# Patient Record
Sex: Female | Born: 1955 | Race: Black or African American | Hispanic: No | Marital: Married | Smoking: Former smoker
Health system: Southern US, Community
[De-identification: ages and names within clinical notes are randomized; demographics above are authoritative.]

## PROBLEM LIST (undated history)

## (undated) DIAGNOSIS — C541 Malignant neoplasm of endometrium: Secondary | ICD-10-CM

## (undated) DIAGNOSIS — I701 Atherosclerosis of renal artery: Secondary | ICD-10-CM

## (undated) DIAGNOSIS — K635 Polyp of colon: Secondary | ICD-10-CM

## (undated) DIAGNOSIS — Z8489 Family history of other specified conditions: Secondary | ICD-10-CM

## (undated) DIAGNOSIS — I517 Cardiomegaly: Secondary | ICD-10-CM

## (undated) DIAGNOSIS — I10 Essential (primary) hypertension: Secondary | ICD-10-CM

## (undated) DIAGNOSIS — K5790 Diverticulosis of intestine, part unspecified, without perforation or abscess without bleeding: Secondary | ICD-10-CM

## (undated) DIAGNOSIS — M1990 Unspecified osteoarthritis, unspecified site: Secondary | ICD-10-CM

## (undated) DIAGNOSIS — E785 Hyperlipidemia, unspecified: Secondary | ICD-10-CM

## (undated) DIAGNOSIS — G4730 Sleep apnea, unspecified: Secondary | ICD-10-CM

## (undated) DIAGNOSIS — N189 Chronic kidney disease, unspecified: Secondary | ICD-10-CM

## (undated) HISTORY — DX: Cardiomegaly: I51.7

## (undated) HISTORY — DX: Polyp of colon: K63.5

## (undated) HISTORY — DX: Hyperlipidemia, unspecified: E78.5

## (undated) HISTORY — DX: Essential (primary) hypertension: I10

## (undated) HISTORY — DX: Sleep apnea, unspecified: G47.30

## (undated) HISTORY — PX: TUBAL LIGATION: SHX77

## (undated) HISTORY — DX: Atherosclerosis of renal artery: I70.1

## (undated) HISTORY — DX: Diverticulosis of intestine, part unspecified, without perforation or abscess without bleeding: K57.90

## (undated) HISTORY — PX: REPLACEMENT TOTAL KNEE: SUR1224

---

## 2004-10-26 DIAGNOSIS — I517 Cardiomegaly: Secondary | ICD-10-CM

## 2004-10-26 HISTORY — DX: Cardiomegaly: I51.7

## 2005-05-06 ENCOUNTER — Encounter: Payer: Self-pay | Admitting: Cardiology

## 2005-05-06 ENCOUNTER — Ambulatory Visit: Payer: Self-pay | Admitting: Cardiology

## 2005-05-06 ENCOUNTER — Ambulatory Visit: Payer: Self-pay | Admitting: Internal Medicine

## 2005-05-06 ENCOUNTER — Inpatient Hospital Stay (HOSPITAL_COMMUNITY): Admission: EM | Admit: 2005-05-06 | Discharge: 2005-05-08 | Payer: Self-pay | Admitting: Emergency Medicine

## 2005-05-20 ENCOUNTER — Ambulatory Visit: Payer: Self-pay | Admitting: Internal Medicine

## 2005-06-03 ENCOUNTER — Ambulatory Visit: Payer: Self-pay | Admitting: Internal Medicine

## 2005-07-06 ENCOUNTER — Ambulatory Visit: Payer: Self-pay | Admitting: Internal Medicine

## 2005-08-24 ENCOUNTER — Ambulatory Visit: Payer: Self-pay | Admitting: Hospitalist

## 2005-09-01 ENCOUNTER — Ambulatory Visit: Payer: Self-pay | Admitting: Internal Medicine

## 2005-09-23 ENCOUNTER — Ambulatory Visit: Payer: Self-pay | Admitting: Internal Medicine

## 2005-10-07 ENCOUNTER — Ambulatory Visit: Payer: Self-pay | Admitting: Internal Medicine

## 2005-10-16 ENCOUNTER — Ambulatory Visit: Payer: Self-pay | Admitting: Internal Medicine

## 2005-10-26 DIAGNOSIS — I701 Atherosclerosis of renal artery: Secondary | ICD-10-CM

## 2005-10-26 DIAGNOSIS — G4730 Sleep apnea, unspecified: Secondary | ICD-10-CM

## 2005-10-26 HISTORY — DX: Sleep apnea, unspecified: G47.30

## 2005-10-26 HISTORY — DX: Atherosclerosis of renal artery: I70.1

## 2005-10-26 HISTORY — PX: RENAL ARTERY STENT: SHX2321

## 2005-10-28 ENCOUNTER — Ambulatory Visit: Payer: Self-pay | Admitting: Internal Medicine

## 2005-11-11 ENCOUNTER — Ambulatory Visit: Payer: Self-pay | Admitting: Internal Medicine

## 2005-11-12 ENCOUNTER — Ambulatory Visit: Payer: Self-pay | Admitting: Internal Medicine

## 2005-11-17 ENCOUNTER — Ambulatory Visit (HOSPITAL_BASED_OUTPATIENT_CLINIC_OR_DEPARTMENT_OTHER): Admission: RE | Admit: 2005-11-17 | Discharge: 2005-11-17 | Payer: Self-pay | Admitting: Internal Medicine

## 2005-11-22 ENCOUNTER — Ambulatory Visit: Payer: Self-pay | Admitting: Internal Medicine

## 2005-11-23 ENCOUNTER — Ambulatory Visit: Payer: Self-pay | Admitting: Internal Medicine

## 2005-11-26 ENCOUNTER — Ambulatory Visit (HOSPITAL_COMMUNITY): Admission: RE | Admit: 2005-11-26 | Discharge: 2005-11-26 | Payer: Self-pay | Admitting: Internal Medicine

## 2005-11-30 ENCOUNTER — Ambulatory Visit: Payer: Self-pay | Admitting: Internal Medicine

## 2005-12-07 ENCOUNTER — Ambulatory Visit: Payer: Self-pay | Admitting: Internal Medicine

## 2005-12-14 ENCOUNTER — Ambulatory Visit: Payer: Self-pay | Admitting: Internal Medicine

## 2005-12-21 ENCOUNTER — Ambulatory Visit: Payer: Self-pay | Admitting: Internal Medicine

## 2006-01-04 ENCOUNTER — Ambulatory Visit: Payer: Self-pay | Admitting: Internal Medicine

## 2006-01-18 ENCOUNTER — Ambulatory Visit: Payer: Self-pay | Admitting: Internal Medicine

## 2006-02-25 ENCOUNTER — Ambulatory Visit: Payer: Self-pay | Admitting: Cardiology

## 2006-03-03 ENCOUNTER — Ambulatory Visit (HOSPITAL_COMMUNITY): Admission: RE | Admit: 2006-03-03 | Discharge: 2006-03-04 | Payer: Self-pay | Admitting: Cardiology

## 2006-03-03 ENCOUNTER — Ambulatory Visit: Payer: Self-pay | Admitting: Cardiology

## 2006-03-15 ENCOUNTER — Ambulatory Visit: Payer: Self-pay | Admitting: Internal Medicine

## 2006-03-25 ENCOUNTER — Ambulatory Visit: Payer: Self-pay | Admitting: Internal Medicine

## 2006-04-13 ENCOUNTER — Ambulatory Visit: Payer: Self-pay | Admitting: Cardiology

## 2006-04-13 ENCOUNTER — Ambulatory Visit (HOSPITAL_BASED_OUTPATIENT_CLINIC_OR_DEPARTMENT_OTHER): Admission: RE | Admit: 2006-04-13 | Discharge: 2006-04-13 | Payer: Self-pay | Admitting: Internal Medicine

## 2006-04-18 ENCOUNTER — Ambulatory Visit: Payer: Self-pay | Admitting: Internal Medicine

## 2006-04-30 ENCOUNTER — Ambulatory Visit: Payer: Self-pay

## 2006-05-17 ENCOUNTER — Ambulatory Visit: Payer: Self-pay | Admitting: Hospitalist

## 2006-07-15 ENCOUNTER — Ambulatory Visit: Payer: Self-pay | Admitting: Internal Medicine

## 2006-07-21 ENCOUNTER — Encounter: Payer: Self-pay | Admitting: Internal Medicine

## 2006-07-21 DIAGNOSIS — I079 Rheumatic tricuspid valve disease, unspecified: Secondary | ICD-10-CM | POA: Insufficient documentation

## 2006-07-21 DIAGNOSIS — E785 Hyperlipidemia, unspecified: Secondary | ICD-10-CM

## 2006-09-01 ENCOUNTER — Ambulatory Visit: Payer: Self-pay | Admitting: Cardiology

## 2006-11-16 ENCOUNTER — Ambulatory Visit: Payer: Self-pay

## 2006-12-09 ENCOUNTER — Inpatient Hospital Stay (HOSPITAL_COMMUNITY): Admission: EM | Admit: 2006-12-09 | Discharge: 2006-12-15 | Payer: Self-pay | Admitting: Emergency Medicine

## 2006-12-09 ENCOUNTER — Ambulatory Visit: Payer: Self-pay | Admitting: Vascular Surgery

## 2006-12-09 ENCOUNTER — Ambulatory Visit: Payer: Self-pay | Admitting: Internal Medicine

## 2006-12-10 ENCOUNTER — Ambulatory Visit: Payer: Self-pay | Admitting: Dentistry

## 2006-12-16 ENCOUNTER — Encounter (INDEPENDENT_AMBULATORY_CARE_PROVIDER_SITE_OTHER): Payer: Self-pay | Admitting: Internal Medicine

## 2006-12-17 ENCOUNTER — Encounter (INDEPENDENT_AMBULATORY_CARE_PROVIDER_SITE_OTHER): Payer: Self-pay | Admitting: Internal Medicine

## 2006-12-17 ENCOUNTER — Ambulatory Visit: Payer: Self-pay | Admitting: Internal Medicine

## 2006-12-17 LAB — CONVERTED CEMR LAB
Calcium: 9.2 mg/dL (ref 8.4–10.5)
Chloride: 102 meq/L (ref 96–112)
Creatinine, Ser: 1.11 mg/dL (ref 0.40–1.20)
Potassium: 4 meq/L (ref 3.5–5.3)
Sodium: 138 meq/L (ref 135–145)

## 2006-12-21 ENCOUNTER — Encounter: Admission: AD | Admit: 2006-12-21 | Discharge: 2006-12-21 | Payer: Self-pay | Admitting: Dentistry

## 2006-12-21 ENCOUNTER — Encounter (INDEPENDENT_AMBULATORY_CARE_PROVIDER_SITE_OTHER): Payer: Self-pay | Admitting: Internal Medicine

## 2006-12-27 ENCOUNTER — Ambulatory Visit: Payer: Self-pay | Admitting: *Deleted

## 2006-12-27 ENCOUNTER — Encounter (INDEPENDENT_AMBULATORY_CARE_PROVIDER_SITE_OTHER): Payer: Self-pay | Admitting: Internal Medicine

## 2006-12-27 DIAGNOSIS — R740 Nonspecific elevation of levels of transaminase and lactic acid dehydrogenase [LDH]: Secondary | ICD-10-CM

## 2006-12-27 LAB — CONVERTED CEMR LAB
ALT: 48 units/L — ABNORMAL HIGH (ref 0–35)
AST: 66 units/L — ABNORMAL HIGH (ref 0–37)
Blood Glucose, Fingerstick: 135
Calcium: 9.9 mg/dL (ref 8.4–10.5)
Creatinine, Ser: 1.25 mg/dL — ABNORMAL HIGH (ref 0.40–1.20)
Hgb A1c MFr Bld: 11 %
Indirect Bilirubin: 1 mg/dL — ABNORMAL HIGH (ref 0.0–0.9)
Potassium: 4.1 meq/L (ref 3.5–5.3)
Sodium: 135 meq/L (ref 135–145)
Total Protein: 7.1 g/dL (ref 6.0–8.3)

## 2007-01-21 ENCOUNTER — Ambulatory Visit: Payer: Self-pay | Admitting: Internal Medicine

## 2007-01-21 ENCOUNTER — Encounter (INDEPENDENT_AMBULATORY_CARE_PROVIDER_SITE_OTHER): Payer: Self-pay | Admitting: Internal Medicine

## 2007-01-21 LAB — CONVERTED CEMR LAB
ALT: 27 units/L (ref 0–35)
AST: 20 units/L (ref 0–37)
Albumin: 4.3 g/dL (ref 3.5–5.2)
Alkaline Phosphatase: 68 units/L (ref 39–117)
Calcium: 10.1 mg/dL (ref 8.4–10.5)
Glucose, Bld: 93 mg/dL (ref 70–99)
Microalb Creat Ratio: 100.1 mg/g — ABNORMAL HIGH (ref 0.0–30.0)
Total Bilirubin: 0.6 mg/dL (ref 0.3–1.2)
Total CK: 102 units/L (ref 7–177)

## 2007-01-24 ENCOUNTER — Ambulatory Visit: Payer: Self-pay | Admitting: Dentistry

## 2007-01-25 ENCOUNTER — Telehealth: Payer: Self-pay | Admitting: *Deleted

## 2007-02-18 ENCOUNTER — Ambulatory Visit: Payer: Self-pay | Admitting: Dentistry

## 2007-02-21 ENCOUNTER — Ambulatory Visit: Payer: Self-pay | Admitting: Internal Medicine

## 2007-02-21 DIAGNOSIS — E119 Type 2 diabetes mellitus without complications: Secondary | ICD-10-CM | POA: Insufficient documentation

## 2007-03-07 ENCOUNTER — Telehealth (INDEPENDENT_AMBULATORY_CARE_PROVIDER_SITE_OTHER): Payer: Self-pay | Admitting: *Deleted

## 2007-03-23 ENCOUNTER — Ambulatory Visit: Payer: Self-pay | Admitting: Internal Medicine

## 2007-03-23 LAB — CONVERTED CEMR LAB: Blood Glucose, Fingerstick: 141

## 2007-03-28 ENCOUNTER — Encounter (INDEPENDENT_AMBULATORY_CARE_PROVIDER_SITE_OTHER): Payer: Self-pay | Admitting: *Deleted

## 2007-04-06 ENCOUNTER — Encounter (INDEPENDENT_AMBULATORY_CARE_PROVIDER_SITE_OTHER): Payer: Self-pay | Admitting: Internal Medicine

## 2007-04-13 ENCOUNTER — Ambulatory Visit: Payer: Self-pay | Admitting: Dentistry

## 2007-05-12 ENCOUNTER — Ambulatory Visit: Payer: Self-pay | Admitting: Internal Medicine

## 2007-05-12 ENCOUNTER — Encounter (INDEPENDENT_AMBULATORY_CARE_PROVIDER_SITE_OTHER): Payer: Self-pay | Admitting: Internal Medicine

## 2007-05-12 DIAGNOSIS — R809 Proteinuria, unspecified: Secondary | ICD-10-CM | POA: Insufficient documentation

## 2007-05-13 LAB — CONVERTED CEMR LAB
AST: 18 units/L (ref 0–37)
Albumin: 4.3 g/dL (ref 3.5–5.2)
Alkaline Phosphatase: 59 units/L (ref 39–117)
CO2: 28 meq/L (ref 19–32)
Chloride: 99 meq/L (ref 96–112)
HDL: 48 mg/dL (ref >39)
Sodium: 141 meq/L (ref 135–145)
Total Protein: 7.4 g/dL (ref 6.0–8.3)

## 2007-06-01 ENCOUNTER — Ambulatory Visit: Payer: Self-pay | Admitting: Dentistry

## 2007-06-22 ENCOUNTER — Ambulatory Visit: Payer: Self-pay | Admitting: Infectious Disease

## 2007-06-22 ENCOUNTER — Encounter (INDEPENDENT_AMBULATORY_CARE_PROVIDER_SITE_OTHER): Payer: Self-pay | Admitting: Internal Medicine

## 2007-06-22 LAB — CONVERTED CEMR LAB: Blood Glucose, Fingerstick: 136

## 2007-06-30 LAB — CONVERTED CEMR LAB: OCCULT 1: NEGATIVE

## 2007-07-06 ENCOUNTER — Ambulatory Visit: Payer: Self-pay | Admitting: Internal Medicine

## 2007-08-30 ENCOUNTER — Ambulatory Visit: Payer: Self-pay | Admitting: Dentistry

## 2007-09-29 ENCOUNTER — Encounter (INDEPENDENT_AMBULATORY_CARE_PROVIDER_SITE_OTHER): Payer: Self-pay | Admitting: Internal Medicine

## 2007-09-29 ENCOUNTER — Ambulatory Visit: Payer: Self-pay | Admitting: Internal Medicine

## 2007-10-10 ENCOUNTER — Ambulatory Visit: Payer: Self-pay | Admitting: Internal Medicine

## 2007-10-19 ENCOUNTER — Ambulatory Visit: Payer: Self-pay | Admitting: Cardiovascular Disease

## 2007-11-04 ENCOUNTER — Ambulatory Visit: Payer: Self-pay | Admitting: Cardiovascular Disease

## 2007-11-04 ENCOUNTER — Ambulatory Visit: Payer: Self-pay

## 2007-11-14 ENCOUNTER — Encounter (INDEPENDENT_AMBULATORY_CARE_PROVIDER_SITE_OTHER): Payer: Self-pay | Admitting: Internal Medicine

## 2007-11-14 ENCOUNTER — Ambulatory Visit: Payer: Self-pay | Admitting: Internal Medicine

## 2007-11-14 LAB — CONVERTED CEMR LAB: Hgb A1c MFr Bld: 6.8 %

## 2007-11-15 LAB — CONVERTED CEMR LAB
AST: 20 units/L (ref 0–37)
BUN: 16 mg/dL (ref 6–23)
CO2: 26 meq/L (ref 19–32)
Calcium: 9.7 mg/dL (ref 8.4–10.5)
Chloride: 102 meq/L (ref 96–112)
Creatinine, Ser: 1.19 mg/dL (ref 0.40–1.20)
HCT: 45.2 % (ref 36.0–46.0)
Hemoglobin: 14.8 g/dL (ref 12.0–15.0)
RDW: 15.8 % — ABNORMAL HIGH (ref 11.5–15.5)

## 2008-01-02 ENCOUNTER — Encounter: Payer: Self-pay | Admitting: Internal Medicine

## 2008-01-02 ENCOUNTER — Ambulatory Visit: Payer: Self-pay | Admitting: Cardiovascular Disease

## 2008-01-06 ENCOUNTER — Telehealth (INDEPENDENT_AMBULATORY_CARE_PROVIDER_SITE_OTHER): Payer: Self-pay | Admitting: Internal Medicine

## 2008-01-16 ENCOUNTER — Ambulatory Visit: Payer: Self-pay | Admitting: Cardiovascular Disease

## 2008-01-16 LAB — CONVERTED CEMR LAB
CO2: 33 meq/L — ABNORMAL HIGH (ref 19–32)
Chloride: 98 meq/L (ref 96–112)
Potassium: 4.2 meq/L (ref 3.5–5.1)
Sodium: 136 meq/L (ref 135–145)

## 2008-02-03 ENCOUNTER — Encounter (INDEPENDENT_AMBULATORY_CARE_PROVIDER_SITE_OTHER): Payer: Self-pay | Admitting: Internal Medicine

## 2008-02-28 ENCOUNTER — Encounter (INDEPENDENT_AMBULATORY_CARE_PROVIDER_SITE_OTHER): Payer: Self-pay | Admitting: *Deleted

## 2008-03-02 ENCOUNTER — Encounter (INDEPENDENT_AMBULATORY_CARE_PROVIDER_SITE_OTHER): Payer: Self-pay | Admitting: Internal Medicine

## 2008-03-02 ENCOUNTER — Ambulatory Visit: Payer: Self-pay | Admitting: *Deleted

## 2008-03-02 LAB — CONVERTED CEMR LAB
Blood Glucose, AC Bkfst: 121 mg/dL
Hgb A1c MFr Bld: 6.9 %

## 2008-03-05 ENCOUNTER — Encounter (INDEPENDENT_AMBULATORY_CARE_PROVIDER_SITE_OTHER): Payer: Self-pay | Admitting: Internal Medicine

## 2008-03-05 LAB — CONVERTED CEMR LAB
ALT: 21 units/L (ref 0–35)
AST: 19 units/L (ref 0–37)
Albumin: 4.7 g/dL (ref 3.5–5.2)
Calcium: 10 mg/dL (ref 8.4–10.5)
Chloride: 106 meq/L (ref 96–112)
LDL Cholesterol: 95 mg/dL (ref 0–99)
Potassium: 4.2 meq/L (ref 3.5–5.3)
Sodium: 143 meq/L (ref 135–145)

## 2008-03-13 ENCOUNTER — Ambulatory Visit: Payer: Self-pay | Admitting: Dentistry

## 2008-03-14 ENCOUNTER — Ambulatory Visit (HOSPITAL_COMMUNITY): Admission: RE | Admit: 2008-03-14 | Discharge: 2008-03-14 | Payer: Self-pay | Admitting: Internal Medicine

## 2008-06-11 ENCOUNTER — Telehealth (INDEPENDENT_AMBULATORY_CARE_PROVIDER_SITE_OTHER): Payer: Self-pay | Admitting: Internal Medicine

## 2008-07-23 ENCOUNTER — Encounter (INDEPENDENT_AMBULATORY_CARE_PROVIDER_SITE_OTHER): Payer: Self-pay | Admitting: Internal Medicine

## 2008-07-23 ENCOUNTER — Ambulatory Visit: Payer: Self-pay | Admitting: Internal Medicine

## 2008-07-23 LAB — CONVERTED CEMR LAB
Blood Glucose, Fingerstick: 118
Hgb A1c MFr Bld: 6.6 %

## 2008-08-21 ENCOUNTER — Ambulatory Visit: Payer: Self-pay | Admitting: Cardiovascular Disease

## 2008-11-06 ENCOUNTER — Encounter: Payer: Self-pay | Admitting: Internal Medicine

## 2008-11-06 ENCOUNTER — Ambulatory Visit: Payer: Self-pay | Admitting: Infectious Disease

## 2008-11-06 ENCOUNTER — Encounter (INDEPENDENT_AMBULATORY_CARE_PROVIDER_SITE_OTHER): Payer: Self-pay | Admitting: Internal Medicine

## 2008-11-06 DIAGNOSIS — I701 Atherosclerosis of renal artery: Secondary | ICD-10-CM | POA: Insufficient documentation

## 2008-11-06 LAB — CONVERTED CEMR LAB
ALT: 23 units/L (ref 0–35)
AST: 19 units/L (ref 0–37)
Albumin: 4 g/dL (ref 3.5–5.2)
Alkaline Phosphatase: 65 units/L (ref 39–117)
Basophils Absolute: 0 10*3/uL (ref 0.0–0.1)
Blood Glucose, AC Bkfst: 123 mg/dL
Eosinophils Absolute: 0.1 10*3/uL (ref 0.0–0.7)
Glucose, Bld: 131 mg/dL — ABNORMAL HIGH (ref 70–99)
Lymphs Abs: 1.4 10*3/uL (ref 0.7–4.0)
MCV: 79.5 fL (ref 78.0–100.0)
Neutrophils Relative %: 75 % (ref 43–77)
Platelets: 253 10*3/uL (ref 150–400)
Potassium: 3.9 meq/L (ref 3.5–5.3)
RDW: 16.5 % — ABNORMAL HIGH (ref 11.5–15.5)
Sodium: 143 meq/L (ref 135–145)
Total Bilirubin: 1.1 mg/dL (ref 0.3–1.2)
Total Protein: 7.4 g/dL (ref 6.0–8.3)
Vitamin B-12: 1016 pg/mL — ABNORMAL HIGH (ref 211–911)
WBC: 9.2 10*3/uL (ref 4.0–10.5)

## 2008-11-07 ENCOUNTER — Encounter (INDEPENDENT_AMBULATORY_CARE_PROVIDER_SITE_OTHER): Payer: Self-pay | Admitting: Internal Medicine

## 2008-11-13 ENCOUNTER — Ambulatory Visit (HOSPITAL_COMMUNITY): Admission: RE | Admit: 2008-11-13 | Discharge: 2008-11-13 | Payer: Self-pay | Admitting: Infectious Disease

## 2008-11-13 ENCOUNTER — Encounter: Payer: Self-pay | Admitting: Infectious Disease

## 2008-11-13 ENCOUNTER — Ambulatory Visit: Payer: Self-pay | Admitting: Vascular Surgery

## 2008-11-13 ENCOUNTER — Ambulatory Visit: Payer: Self-pay | Admitting: Cardiology

## 2008-11-16 ENCOUNTER — Ambulatory Visit: Payer: Self-pay

## 2008-11-20 ENCOUNTER — Encounter (INDEPENDENT_AMBULATORY_CARE_PROVIDER_SITE_OTHER): Payer: Self-pay | Admitting: Internal Medicine

## 2008-11-20 ENCOUNTER — Ambulatory Visit: Payer: Self-pay | Admitting: Infectious Disease

## 2008-12-05 ENCOUNTER — Encounter (INDEPENDENT_AMBULATORY_CARE_PROVIDER_SITE_OTHER): Payer: Self-pay | Admitting: Internal Medicine

## 2008-12-05 ENCOUNTER — Ambulatory Visit: Payer: Self-pay | Admitting: Internal Medicine

## 2008-12-05 LAB — CONVERTED CEMR LAB
BUN: 23 mg/dL (ref 6–23)
CO2: 25 meq/L (ref 19–32)
Chloride: 102 meq/L (ref 96–112)
Creatinine, Ser: 1.15 mg/dL (ref 0.40–1.20)
Glucose, Bld: 152 mg/dL — ABNORMAL HIGH (ref 70–99)
Potassium: 4.6 meq/L (ref 3.5–5.3)

## 2008-12-27 ENCOUNTER — Encounter (INDEPENDENT_AMBULATORY_CARE_PROVIDER_SITE_OTHER): Payer: Self-pay | Admitting: Internal Medicine

## 2008-12-27 ENCOUNTER — Ambulatory Visit: Payer: Self-pay | Admitting: *Deleted

## 2008-12-28 ENCOUNTER — Encounter (INDEPENDENT_AMBULATORY_CARE_PROVIDER_SITE_OTHER): Payer: Self-pay | Admitting: Internal Medicine

## 2008-12-28 LAB — CONVERTED CEMR LAB: RBC Folate: 1008 ng/mL — ABNORMAL HIGH (ref 180–600)

## 2009-01-01 LAB — CONVERTED CEMR LAB
Alkaline Phosphatase: 55 units/L (ref 39–117)
BUN: 28 mg/dL — ABNORMAL HIGH (ref 6–23)
CO2: 25 meq/L (ref 19–32)
Cholesterol: 168 mg/dL (ref 0–200)
Creatinine, Ser: 1.1 mg/dL (ref 0.40–1.20)
Glucose, Bld: 129 mg/dL — ABNORMAL HIGH (ref 70–99)
HDL: 42 mg/dL (ref >39)
LDL Cholesterol: 81 mg/dL (ref 0–99)
Total Bilirubin: 0.6 mg/dL (ref 0.3–1.2)
Total Protein: 7.2 g/dL (ref 6.0–8.3)
Triglycerides: 223 mg/dL — ABNORMAL HIGH (ref <150)
VLDL: 45 mg/dL — ABNORMAL HIGH (ref 0–40)

## 2009-01-09 ENCOUNTER — Ambulatory Visit: Payer: Self-pay | Admitting: Dentistry

## 2009-01-14 ENCOUNTER — Encounter (INDEPENDENT_AMBULATORY_CARE_PROVIDER_SITE_OTHER): Payer: Self-pay | Admitting: Internal Medicine

## 2009-01-14 ENCOUNTER — Ambulatory Visit: Payer: Self-pay | Admitting: Internal Medicine

## 2009-01-16 ENCOUNTER — Telehealth (INDEPENDENT_AMBULATORY_CARE_PROVIDER_SITE_OTHER): Payer: Self-pay | Admitting: Internal Medicine

## 2009-01-17 ENCOUNTER — Encounter (INDEPENDENT_AMBULATORY_CARE_PROVIDER_SITE_OTHER): Payer: Self-pay | Admitting: Internal Medicine

## 2009-01-22 ENCOUNTER — Encounter (INDEPENDENT_AMBULATORY_CARE_PROVIDER_SITE_OTHER): Payer: Self-pay | Admitting: Internal Medicine

## 2009-02-22 ENCOUNTER — Ambulatory Visit: Payer: Self-pay | Admitting: Infectious Disease

## 2009-02-22 ENCOUNTER — Encounter (INDEPENDENT_AMBULATORY_CARE_PROVIDER_SITE_OTHER): Payer: Self-pay | Admitting: Internal Medicine

## 2009-02-25 ENCOUNTER — Telehealth (INDEPENDENT_AMBULATORY_CARE_PROVIDER_SITE_OTHER): Payer: Self-pay | Admitting: *Deleted

## 2009-03-01 ENCOUNTER — Ambulatory Visit: Payer: Self-pay | Admitting: Internal Medicine

## 2009-03-01 ENCOUNTER — Inpatient Hospital Stay (HOSPITAL_COMMUNITY): Admission: EM | Admit: 2009-03-01 | Discharge: 2009-03-04 | Payer: Self-pay | Admitting: Emergency Medicine

## 2009-03-04 ENCOUNTER — Encounter (INDEPENDENT_AMBULATORY_CARE_PROVIDER_SITE_OTHER): Payer: Self-pay | Admitting: *Deleted

## 2009-03-05 ENCOUNTER — Encounter: Payer: Self-pay | Admitting: Internal Medicine

## 2009-03-08 ENCOUNTER — Telehealth: Payer: Self-pay | Admitting: *Deleted

## 2009-03-08 ENCOUNTER — Telehealth (INDEPENDENT_AMBULATORY_CARE_PROVIDER_SITE_OTHER): Payer: Self-pay | Admitting: Internal Medicine

## 2009-03-13 ENCOUNTER — Encounter: Admission: RE | Admit: 2009-03-13 | Discharge: 2009-03-18 | Payer: Self-pay | Admitting: Internal Medicine

## 2009-03-13 ENCOUNTER — Encounter (INDEPENDENT_AMBULATORY_CARE_PROVIDER_SITE_OTHER): Payer: Self-pay | Admitting: Internal Medicine

## 2009-03-14 ENCOUNTER — Ambulatory Visit: Payer: Self-pay | Admitting: Infectious Disease

## 2009-03-14 ENCOUNTER — Encounter (INDEPENDENT_AMBULATORY_CARE_PROVIDER_SITE_OTHER): Payer: Self-pay | Admitting: Internal Medicine

## 2009-03-14 LAB — CONVERTED CEMR LAB: Blood Glucose, Fingerstick: 120

## 2009-03-15 ENCOUNTER — Telehealth: Payer: Self-pay | Admitting: Licensed Clinical Social Worker

## 2009-03-19 ENCOUNTER — Encounter: Payer: Self-pay | Admitting: Licensed Clinical Social Worker

## 2009-03-19 ENCOUNTER — Encounter (INDEPENDENT_AMBULATORY_CARE_PROVIDER_SITE_OTHER): Payer: Self-pay | Admitting: Internal Medicine

## 2009-03-27 ENCOUNTER — Ambulatory Visit: Payer: Self-pay | Admitting: Internal Medicine

## 2009-03-27 ENCOUNTER — Encounter (INDEPENDENT_AMBULATORY_CARE_PROVIDER_SITE_OTHER): Payer: Self-pay | Admitting: Internal Medicine

## 2009-03-27 LAB — CONVERTED CEMR LAB: Sed Rate: 12 mm/hr (ref 0–22)

## 2009-04-03 ENCOUNTER — Ambulatory Visit (HOSPITAL_COMMUNITY): Admission: RE | Admit: 2009-04-03 | Discharge: 2009-04-03 | Payer: Self-pay | Admitting: Family Medicine

## 2009-04-25 ENCOUNTER — Encounter: Payer: Self-pay | Admitting: Internal Medicine

## 2009-06-20 ENCOUNTER — Ambulatory Visit: Payer: Self-pay | Admitting: Cardiovascular Disease

## 2009-06-20 DIAGNOSIS — I10 Essential (primary) hypertension: Secondary | ICD-10-CM

## 2009-08-14 ENCOUNTER — Ambulatory Visit: Payer: Self-pay | Admitting: Dentistry

## 2009-09-26 ENCOUNTER — Ambulatory Visit: Payer: Self-pay | Admitting: Internal Medicine

## 2009-09-26 LAB — CONVERTED CEMR LAB
Blood Glucose, AC Bkfst: 119 mg/dL
Hgb A1c MFr Bld: 6.8 %

## 2009-09-27 ENCOUNTER — Encounter: Payer: Self-pay | Admitting: Internal Medicine

## 2009-09-27 LAB — CONVERTED CEMR LAB
CO2: 26 meq/L (ref 19–32)
Calcium: 10.6 mg/dL — ABNORMAL HIGH (ref 8.4–10.5)
Creatinine, Ser: 1.7 mg/dL — ABNORMAL HIGH (ref 0.40–1.20)
Glucose, Bld: 127 mg/dL — ABNORMAL HIGH (ref 70–99)
Sodium: 143 meq/L (ref 135–145)

## 2009-10-09 ENCOUNTER — Ambulatory Visit: Payer: Self-pay | Admitting: Internal Medicine

## 2009-10-11 LAB — CONVERTED CEMR LAB
Calcium: 9.8 mg/dL (ref 8.4–10.5)
Chloride: 101 meq/L (ref 96–112)
Creatinine, Ser: 1.59 mg/dL — ABNORMAL HIGH (ref 0.40–1.20)
Sodium: 140 meq/L (ref 135–145)

## 2009-11-18 ENCOUNTER — Ambulatory Visit: Payer: Self-pay

## 2009-11-18 ENCOUNTER — Encounter: Payer: Self-pay | Admitting: Cardiovascular Disease

## 2009-12-30 ENCOUNTER — Encounter: Payer: Self-pay | Admitting: Internal Medicine

## 2009-12-30 ENCOUNTER — Ambulatory Visit: Payer: Self-pay | Admitting: Infectious Diseases

## 2009-12-30 LAB — CONVERTED CEMR LAB
ALT: 14 units/L (ref 0–35)
AST: 12 units/L (ref 0–37)
Albumin: 3.9 g/dL (ref 3.5–5.2)
Alkaline Phosphatase: 60 units/L (ref 39–117)
Blood Glucose, AC Bkfst: 120 mg/dL
LDL Cholesterol: 125 mg/dL — ABNORMAL HIGH (ref 0–99)
Potassium: 4.1 meq/L (ref 3.5–5.3)
Sodium: 142 meq/L (ref 135–145)
Total Bilirubin: 0.5 mg/dL (ref 0.3–1.2)
Total Protein: 6.9 g/dL (ref 6.0–8.3)
VLDL: 22 mg/dL (ref 0–40)

## 2010-01-06 ENCOUNTER — Encounter (INDEPENDENT_AMBULATORY_CARE_PROVIDER_SITE_OTHER): Payer: Self-pay | Admitting: *Deleted

## 2010-01-15 ENCOUNTER — Encounter: Payer: Self-pay | Admitting: Cardiovascular Disease

## 2010-01-16 ENCOUNTER — Encounter (INDEPENDENT_AMBULATORY_CARE_PROVIDER_SITE_OTHER): Payer: Self-pay | Admitting: *Deleted

## 2010-01-20 ENCOUNTER — Ambulatory Visit: Payer: Self-pay | Admitting: Gastroenterology

## 2010-01-20 ENCOUNTER — Encounter (INDEPENDENT_AMBULATORY_CARE_PROVIDER_SITE_OTHER): Payer: Self-pay | Admitting: *Deleted

## 2010-02-03 ENCOUNTER — Ambulatory Visit: Payer: Self-pay | Admitting: Gastroenterology

## 2010-02-03 HISTORY — PX: COLONOSCOPY: SHX174

## 2010-02-05 ENCOUNTER — Encounter: Payer: Self-pay | Admitting: Gastroenterology

## 2010-02-17 ENCOUNTER — Encounter: Payer: Self-pay | Admitting: Cardiovascular Disease

## 2010-02-25 ENCOUNTER — Ambulatory Visit: Payer: Self-pay | Admitting: Dentistry

## 2010-04-04 ENCOUNTER — Ambulatory Visit (HOSPITAL_COMMUNITY): Admission: RE | Admit: 2010-04-04 | Discharge: 2010-04-04 | Payer: Self-pay | Admitting: Family Medicine

## 2010-04-09 ENCOUNTER — Telehealth: Payer: Self-pay | Admitting: Internal Medicine

## 2010-06-01 ENCOUNTER — Encounter: Payer: Self-pay | Admitting: Internal Medicine

## 2010-06-09 ENCOUNTER — Ambulatory Visit: Payer: Self-pay | Admitting: Cardiovascular Disease

## 2010-06-10 ENCOUNTER — Ambulatory Visit: Payer: Self-pay | Admitting: Internal Medicine

## 2010-06-10 LAB — CONVERTED CEMR LAB
Blood Glucose, Fingerstick: 149
Hgb A1c MFr Bld: 6.9 %

## 2010-09-30 ENCOUNTER — Ambulatory Visit: Payer: Self-pay | Admitting: Internal Medicine

## 2010-09-30 LAB — CONVERTED CEMR LAB
Blood Glucose, Fingerstick: 148
Calcium: 9.8 mg/dL (ref 8.4–10.5)
Glucose, Bld: 142 mg/dL — ABNORMAL HIGH (ref 70–99)
Hgb A1c MFr Bld: 6.6 %
Sodium: 140 meq/L (ref 135–145)

## 2010-11-16 ENCOUNTER — Encounter: Payer: Self-pay | Admitting: Nephrology

## 2010-11-25 ENCOUNTER — Encounter: Payer: Self-pay | Admitting: Cardiovascular Disease

## 2010-11-25 NOTE — Letter (Signed)
Summary: Results Letter  [facility]  [place], [place] [address]   Phone: [phone]  Fax: [phone]        February 05, 2010 MRN: [id number]    [facility] [address] [place], [place]  [address]    Dear Ms. Donalson,   Yhe polyp removed during your recent procedure was proven to be adenomatous.  These are pre-cancerous polyps that may have grown into cancers if they had not been removed.  Based on current nationally recognized surveillance guidelines, I recommend that you have a repeat colonoscopy in 5 years.  We will therefore put your information in our reminder system and will contact you in 5 years to schedule a repeat procedure.  Please call if you have any questions or concerns.       Sincerely,  Milus Banister MD  This letter has been electronically signed by your physician.  Appended Document: Results Letter letter mailed 04.13.11

## 2010-11-25 NOTE — Assessment & Plan Note (Signed)
Summary: EST-ROUTINE CHECKUP/CH   Vital Signs:  Patient profile:   55 year old female Height:      63 inches (160.02 cm) Weight:      222.7 pounds (101.23 kg) BMI:     39.59 Temp:     97.0 degrees F (36.11 degrees C) oral Pulse rate:   88 / minute BP sitting:   193 / 105  (right arm)  Vitals Entered By: Hilda Blades Ditzler RN (June 10, 2010 4:20 PM) Is Patient Diabetic? Yes Did you bring your meter with you today? Yes Pain Assessment Patient in pain? yes     Location: both legs Intensity: 8 Type: from diabetes Onset of pain  long time Nutritional Status BMI of > 30 = obese Nutritional Status Detail appetite good CBG Result 149  Have you ever been in a relationship where you felt threatened, hurt or afraid?denies   Does patient need assistance? Functional Status Self care Ambulation Impaired:Risk for fall Comments Uses a cane. Ck-up.   Primary Care Provider:  Trinidad Curet MD   History of Present Illness: 55 yo female with PMH outlined below presents to [facility] for regular follow up appointment. She has no concerns at the time. No recent sicknesses or hospitalizaitons. No episodes of chest pain, SOB, palpitations, no fever or chills. No specific abdominal or urinary concerns. No recent changes in appetite, weight, sleep patterns, mood. She has been seen by cardiologist yesterday and was given 2 BP meds but has not had a chance to refill it yet.   Depression History:      The patient denies a depressed mood most of the day and a diminished interest in her usual daily activities.  The patient denies significant weight loss, significant weight gain, insomnia, hypersomnia, psychomotor agitation, psychomotor retardation, fatigue (loss of energy), feelings of worthlessness (guilt), impaired concentration (indecisiveness), and recurrent thoughts of death or suicide.        The patient denies that she feels like life is not worth living, denies that she wishes that she were dead, and  denies that she has thought about ending her life.         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 Take 1 Tablet By Mouth Once Daily 2)  Lantus 100 Unit/ml Soln (Insulin Glargine) .... Inject 15 Units Subcutaneously Once Daily At Bedtime 3)  Glucophage 1000 Mg Tabs (Metformin Hcl) .... Take 1 Tablet By Mouth Two Times A Day 4)  Norvasc 10 Mg  Tabs (Amlodipine Besylate) .... Take One Tablet By Mouth Once A Day 5)  Pravachol 40 Mg Tabs (Pravastatin Sodium) .... Take 2  Tablets By Mouth Once A Day 6)  Coreg 25 Mg  Tabs (Carvedilol) .... Take 1 Tablet By Mouth Two Times A Day 7)  Furosemide 40 Mg  Tabs (Furosemide) .... Take 1 Tablet By Mouth Once Twice Daily 8)  Clonidine Hcl 0.3 Mg/24hr Ptwk (Clonidine Hcl) .... Once A Week 9)  Aspirin 81 Mg Tbec (Aspirin) .... Take One Tablet By Mouth Daily 10)  Tramadol Hcl 50 Mg Tabs (Tramadol Hcl) .... Take 1 Tablet Every 8 Hour For Pain As Needed. 11)  Hydralazine Hcl 25 Mg Tabs (Hydralazine Hcl) .... Take One Tablet By Mouth Three Times A Day  Current  Medications (verified): 1)  Benazepril Hcl 40 Mg Tabs (Benazepril Hcl) .... Take 1 Tablet By Mouth Once Daily 2)  Lantus 100 Unit/ml Soln (Insulin Glargine) .... Inject 15 Units Subcutaneously Once Daily At Bedtime 3)  Glucophage 1000 Mg Tabs (Metformin Hcl) .... Take 1 Tablet By Mouth Two Times A Day 4)  Norvasc 10 Mg  Tabs (Amlodipine Besylate) .... Take One Tablet By Mouth Once A Day 5)  Pravachol 40 Mg Tabs (Pravastatin Sodium) .... Take 2 Tablets By Mouth Once A Day 6)  Coreg 25 Mg  Tabs (Carvedilol) .... Take 1 Tablet By Mouth Two Times A Day 7)  Furosemide 40 Mg  Tabs (Furosemide) .... Take 1 Tablet By Mouth Once Twice Daily 8)  Clonidine Hcl 0.3 Mg/24hr Ptwk (Clonidine Hcl) .... Once A Week 9)  Aspirin 81 Mg Tbec (Aspirin) .... Take One Tablet By Mouth Daily 10)  Tramadol Hcl 50 Mg Tabs (Tramadol Hcl) .... Take 1 Tablet Every 8 Hour For Pain As Needed. 11)  Hydralazine Hcl 25 Mg Tabs (Hydralazine Hcl) .... 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  Past Surgical History: Last updated: 06/19/2009  Selective bilateral renal angiography, balloon angioplasty of  the left renal artery.   03/03/2006 Multi. teeth extraction   12/14/2006  Family History: Last updated: 06/19/2009   Mother is alive, with a history of diabetes mellitus.  Father is alive. with a history of diabetes mellitus..  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   Patient with a history of  smoking 1 pack per day for 35 years.  Patient with no use of alcohol by  report.  The patient does have a history of use of tetrahydrocannabinol.  Risk Factors: Exercise: no (06/10/2010)  Risk Factors: Smoking Status: never (06/10/2010) Passive Smoke Exposure: no (06/10/2010)  Family History: Reviewed history from 06/19/2009 and no changes required.   Mother is alive, with a history of diabetes mellitus.  Father is alive. with a history of diabetes mellitus..  Social History: Reviewed history from 06/19/2009 and no changes required. 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  Patient with a history of  smoking 1 pack per day for 35 years.  Patient with no use of alcohol by  report.  The patient does have  a history of use of tetrahydrocannabinol.  Review of Systems       per hPI  Physical Exam  General:  alert, well-developed, and well-nourished.   Lungs:  normal respiratory effort, no intercostal retractions, no accessory muscle use, and normal breath sounds.   Heart:  normal rate and regular rhythm.   Abdomen:  soft, non-tender, normal bowel sounds, and no distention.     Impression & Recommendations:  Problem # 1:  DIABETES MELLITUS, TYPE II (ICD-250.00) At goal, will cont the same regimen . Her updated medication list for this problem includes:    Benazepril Hcl 40 Mg Tabs (Benazepril hcl) .Marland Kitchen... Take 1 tablet by mouth once daily    Lantus 100 Unit/ml Soln (Insulin glargine) ..... Inject 15 units subcutaneously once daily at bedtime    Glucophage 1000 Mg Tabs (Metformin hcl) .Marland Kitchen... Take 1 tablet by mouth two times a day    Aspirin 81 Mg Tbec (Aspirin) .Marland Kitchen... Take one tablet by mouth daily  Labs Reviewed: Creat: 1.17 (12/30/2009)     Last Eye Exam: No diabetic retinopathy.  OU  (03/19/2009) Reviewed HgBA1c results: 6.6 (12/30/2009)  6.8  (09/26/2009)  Orders: T- Capillary Blood Glucose ([id number]) T-Hgb A1C (in-house) [id number])  Problem # 2:  HYPERLIPIDEMIA (ICD-272.4) FLP recently checked, will cont the same regimen and will reassess periodically. Diet discussed.  Her updated medication list for this problem includes:    Pravachol 40 Mg Tabs (Pravastatin sodium) .Marland Kitchen... Take 2 tablets by mouth once a day  Labs Reviewed: SGOT: 12 (12/30/2009)   SGPT: 14 (12/30/2009)   HDL:46 (12/30/2009), 42 (12/27/2008)  LDL:125 (12/30/2009), 81 (12/27/2008)  Chol:193 (12/30/2009), 168 (12/27/2008)  Trig:112 (12/30/2009), 223 (12/27/2008)  Problem # 3:  HYPERTENSION, MALIGNANT, UNCONTROLLED (ICD-401.0) The regimen below is current regimen but pt has not refilled some of these meds. Her cardiologist ahs recommended the med below with HCTZ being taken of the list. I would suspect component of noncompliance here and I have discussed this with patient in detail and explained the importance of medical compliance. Will not change the medication regimen today since increasing the dosing will not help if noncompliance is the issue. Simplification of the regimen is most likely the best approach to this problem so will work on this first. I have asked patient to come back in 1-2 weeks with the paperwork indicating  BP numbers and will readjust the regimen as indicated.   Her updated medication list for this problem includes:    Benazepril Hcl 40 Mg Tabs (Benazepril hcl) .Marland Kitchen... Take 1 tablet by mouth once daily    Norvasc 10 Mg Tabs (Amlodipine besylate) .Marland Kitchen... Take one tablet by mouth once a day    Coreg 25 Mg Tabs (Carvedilol) .Marland Kitchen... Take 1 tablet by mouth two times a day    Furosemide 40 Mg Tabs (Furosemide) .Marland Kitchen... Take 1 tablet by mouth once twice daily    Clonidine Hcl 0.3 Mg/24hr Ptwk (Clonidine hcl) ..... Once a week    Hydralazine Hcl 25 Mg Tabs (Hydralazine hcl) .Marland Kitchen... Take one tablet by mouth three times a day  BP today: 193/105 Prior BP: 174/110  (06/09/2010)  Labs Reviewed: K+: 4.1 (12/30/2009) Creat: : 1.17 (12/30/2009)   Chol: 193 (12/30/2009)   HDL: 46 (12/30/2009)   LDL: 125 (12/30/2009)   TG: 112 (12/30/2009)  Complete Medication List: 1)  Benazepril Hcl 40 Mg Tabs (Benazepril hcl) .... Take 1 tablet by mouth once daily 2)  Lantus 100 Unit/ml Soln (Insulin glargine) .... Inject 15 units  subcutaneously once daily at bedtime 3)  Glucophage 1000 Mg Tabs (Metformin hcl) .... Take 1 tablet by mouth two times a day 4)  Norvasc 10 Mg Tabs (Amlodipine besylate) .... Take one tablet by mouth once a day 5)  Pravachol 40 Mg Tabs (Pravastatin sodium) .... Take 2 tablets by mouth once a day 6)  Coreg 25 Mg Tabs (Carvedilol) .... Take 1 tablet by mouth two times a day 7)  Furosemide 40 Mg Tabs (Furosemide) .... Take 1 tablet by mouth once twice daily 8)  Clonidine Hcl 0.3 Mg/24hr Ptwk (Clonidine hcl) .... Once a week 9)  Aspirin 81 Mg Tbec (Aspirin) .... Take one tablet by mouth daily 10)  Tramadol Hcl 50 Mg Tabs (Tramadol hcl) .... Take 1 tablet every 8 hour for pain as needed. 11)  Hydralazine Hcl 25 Mg Tabs (Hydralazine hcl) .... Take one tablet by mouth three times a day  Patient Instructions: 1)  Please schedule a follow-up appointment in 3 months. 2)  Please check your sugar levels regularly and remember to bring the meter with you to the next clinic appointment, if the sugars are > 350 or < 60 please call us at [phone] 3)  Please check your blood pressure regularly, if it is >170 please call clinic at [place] Immunizations   Influenza vaccine: Fluvax 3+  (12/30/2009)   Influenza vaccine deferral: Deferred  (09/26/2009)    Tetanus booster: 12/30/2009: Tdap    Pneumococcal vaccine: Not documented  Colorectal Screening   Hemoccult: Not documented   Hemoccult action/deferral: Not indicated  (06/10/2010)    Colonoscopy: DONE  (02/03/2010)   Colonoscopy action/deferral: GI referral  (12/30/2009)    Colonoscopy due: 01/2015  Other Screening   Pap smear: Not documented   Pap smear action/deferral: Deferred  (09/26/2009)    Mammogram: No specific mammographic evidence of malignancy.  No significant changes compared to previous study.  Assessment: BIRADS 1.   (04/04/2010)   Mammogram action/deferral: Refused  (09/26/2009)   Mammogram due: 03/2011   Smoking status: never  (06/10/2010)  Diabetes Mellitus   HgbA1C: 6.9  (06/10/2010)   Hemoglobin A1C due: 06/23/2007    Eye exam: No diabetic retinopathy.  OU   (03/19/2009)   Diabetic eye exam action/deferral: Deferred  (06/10/2010)   Eye exam due: 03/19/2010    Foot exam: yes  (11/14/2007)   Foot exam action/deferral: Not indicated   High risk foot: No  (09/26/2009)   Foot care education: 12/27/2007  (03/28/2007)   Foot exam due: 05/11/2008    Urine microalbumin/creatinine ratio: 100.1  (01/21/2007)   Urine microalbumin/cr due: 01/21/2008    Diabetes flowsheet reviewed?: Yes   Progress toward A1C goal: At goal  Lipids   Total Cholesterol: 193  (12/30/2009)   Lipid panel action/deferral: Lipid Panel ordered   LDL: 125  (12/30/2009)   LDL Direct: Not documented   HDL: 46  (12/30/2009)   Triglycerides: 112  (12/30/2009)   Lipid panel due: 12/10/2007    SGOT (AST): 12  (12/30/2009)   SGPT (ALT): 14  (12/30/2009)   Alkaline phosphatase: 60  (12/30/2009)   Total bilirubin: 0.5  (12/30/2009)    Lipid flowsheet reviewed?: Yes   Progress toward LDL goal: Deteriorated  Hypertension   Last Blood Pressure: 193 / 105  (06/10/2010)   Serum creatinine: 1.17  (12/30/2009)   Serum potassium 4.1  (12/30/2009)    Hypertension flowsheet reviewed?: Yes   Progress toward BP goal: Deteriorated  Self-Management Support :  Personal Goals (by the next clinic visit) :     Personal A1C goal: 7  (06/10/2010)     Personal blood pressure goal: 130/80  (06/10/2010)     Personal LDL goal: 100  (06/10/2010)    Patient will work on the  following items until the next clinic visit to reach self-care goals:     Medications and monitoring: take my medicines every day, check my blood sugar, check my blood pressure, bring all of my medications to every visit  (06/10/2010)     Eating: drink diet soda or water instead of juice or soda, eat more vegetables, use fresh or frozen vegetables, eat foods that are low in salt, eat baked foods instead of fried foods, eat fruit for snacks and desserts, limit or avoid alcohol  (06/10/2010)     Activity: take a 30 minute walk every day, park at the far end of the parking lot  (06/10/2010)    Diabetes self-management support: Written self-care plan, Education handout, Resources for patients handout  (06/10/2010)   Diabetes care plan printed   Diabetes education handout printed    Hypertension self-management support: Written self-care plan, Education handout, Resources for patients handout  (06/10/2010)   Hypertension self-care plan printed.   Hypertension education handout printed    Lipid self-management support: Written self-care plan, Education handout, Resources for patients handout  (06/10/2010)   Lipid self-care plan printed.   Lipid education handout printed      Resource handout printed.   Laboratory Results   Blood Tests   Date/Time Received: June 10, 2010 4:30 PM  Date/Time Reported: Lenoria Farrier  June 10, 2010 4:30 PM   HGBA1C: 6.9%   (Normal Range: Non-Diabetic - 3-6%   Control Diabetic - 6-8%) CBG Random:: 149mg /dL

## 2010-11-25 NOTE — Assessment & Plan Note (Signed)
Summary: 32MONTH RECK/MAGICK/VS   Vital Signs:  Patient profile:   55 year old female Height:      63 inches (160.02 cm) Weight:      218.4 pounds (99.27 kg) BMI:     38.83 Temp:     97.8 degrees F (36.56 degrees C) Pulse rate:   94 / minute BP sitting:   194 / 109  (right arm)  Vitals Entered By: Nadine Counts Deborra Medina) (December 30, 2009 8:33 AM) CC: 53mth f/u. c/o recurrent pain lower ext-mostly when walking, bodyaches-especially in the am Is Patient Diabetic? Yes Did you bring your meter with you today? Yes Pain Assessment Patient in pain? yes     Location: right shoulder Intensity: 9 Type: sharp Onset of pain  Intermittent s/p injury june 2010 Nutritional Status BMI of > 30 = obese  Have you ever been in a relationship where you felt threatened, hurt or afraid?No   Does patient need assistance? Functional Status Self care Ambulation Normal    Primary Care Provider:  Trinidad Curet MD  CC:  66mth f/u. c/o recurrent pain lower ext-mostly when walking and bodyaches-especially in the am.  History of Present Illness: 55 yo women with pmh as described in EMR comes in today for routine check up. She again complains of pain in her bilateral legs which is worse with standing and walking and better with rest, 7/10 at its worse and 0/10 right now. the pain is located on the foot and no radiation to ankle or calf is noted. No other complaints at this time.  Preventive Screening-Counseling & Management  Alcohol-Tobacco     Smoking Status: never     Year Quit: 2006     Passive Smoke Exposure: no  Allergies (verified): No Known Drug Allergies   Impression & Recommendations:  Problem # 1:  LEG EDEMA, BILATERAL (ICD-782.3) Patient Kristy [name] was reviewed and she has ahd a ABI's done on her both legs ahich were normal(>1). I will manage the pain with tylenol and tramadol as there is no ischemic/vascular origin for pain. The feet are swollen and that could be a reason for aggravation  but her Lasix and HCTZare managed by cardiologist and I will refer that to him. Her updated medication list for this problem includes:    Furosemide 40 Mg Tabs (Furosemide) .Marland Kitchen... Take 1 tablet by mouth once twice daily    Hydrochlorothiazide 25 Mg Tabs (Hydrochlorothiazide) .Marland Kitchen... Take one tablet by mouth daily.  Problem # 2:  DIABETES MELLITUS, TYPE II (ICD-250.00) Well under control with HBa1c improving. I will refer her to [facility] for diabetes counselling. Meter readings reviewed. Her updated medication list for this problem includes:    Benazepril Hcl 40 Mg Tabs (Benazepril hcl) .Marland Kitchen... Take 1 tablet by mouth once daily    Lantus 100 Unit/ml Soln (Insulin glargine) ..... Inject 15 units subcutaneously once daily at bedtime    Glucophage 1000 Mg Tabs (Metformin hcl) .Marland Kitchen... Take 1 tablet by mouth two times a day    Aspirin 81 Mg Tbec (Aspirin) .Marland Kitchen... Take one tablet by mouth daily  Orders: T-Hgb A1C (in-house) [id number]) T- Capillary Blood Glucose [id number]) Nutrition Referral (Nutrition)  Labs Reviewed: Creat: 1.59 (10/09/2009)     Last Eye Exam: No diabetic retinopathy.  OU  (03/19/2009) Reviewed HgBA1c results: 6.6 (12/30/2009)  6.8 (09/26/2009)  Problem # 3:  HYPERLIPIDEMIA (ICD-272.4) Will check her lipid profile today. Her updated medication list for this problem includes:    Pravachol 40 Mg Tabs (Pravastatin  sodium) .Marland Kitchen... Take 2 tablets by mouth once a day  Orders: T-Lipid Profile [id number]) T-Comprehensive Metabolic Panel ([id number])  Labs Reviewed: SGOT: 23 (12/27/2008)   SGPT: 21 (12/27/2008)   HDL:42 (12/27/2008), 45 (03/02/2008)  LDL:81 (12/27/2008), 95 (03/02/2008)  Chol:168 (12/27/2008), 165 (03/02/2008)  Trig:223 (12/27/2008), 124 (03/02/2008)  Problem # 4:  HYPERTENSION, MALIGNANT, UNCONTROLLED (ICD-401.0) BPis elevated but she did not take her morning meds and also says that she always has increased BP in office. I will reheck it and also after discussion  with Dr Ola Spurr it was concluded that we might try Nifedepine in this patient instead of Norvasc as it has been noted to have increased effect on BP in some patients. This is for Dr Burt Knack to consider the next time he evaluates her in his clinic. Her updated medication list for this problem includes:    Benazepril Hcl 40 Mg Tabs (Benazepril hcl) .Marland Kitchen... Take 1 tablet by mouth once daily    Norvasc 10 Mg Tabs (Amlodipine besylate) .Marland Kitchen... Take 1 tablet by mouth once a day    Coreg 25 Mg Tabs (Carvedilol) .Marland Kitchen... Take 1 tablet by mouth two times a day    Furosemide 40 Mg Tabs (Furosemide) .Marland Kitchen... Take 1 tablet by mouth once twice daily    Clonidine Hcl 0.3 Mg Tabs (Clonidine hcl) .Marland Kitchen... Take 1 tablet by mouth two times a day    Hydrochlorothiazide 25 Mg Tabs (Hydrochlorothiazide) .Marland Kitchen... Take one tablet by mouth daily.  BP today: 194/109 Prior BP: 120/80 (09/26/2009)  Labs Reviewed: K+: 4.0 (10/09/2009) Creat: : 1.59 (10/09/2009)   Chol: 168 (12/27/2008)   HDL: 42 (12/27/2008)   LDL: 81 (12/27/2008)   TG: 223 (12/27/2008)  Problem # 5:  SPECIAL SCREENING FOR MALIGNANT NEOPLASMS COLON (ICD-V76.51) Referred fr colonoscopy and hemmoccult cards today.  Problem # 6:  Preventive Health Care (ICD-V70.0) Tetanus, flu sot, mammogram, pap werediscussed andappropriate vaccine given. Orders: Gastroenterology Referral (GI)  Complete Medication List: 1)  Benazepril Hcl 40 Mg Tabs (Benazepril hcl) .... Take 1 tablet by mouth once daily 2)  Lantus 100 Unit/ml Soln (Insulin glargine) .... Inject 15 units subcutaneously once daily at bedtime 3)  Glucophage 1000 Mg Tabs (Metformin hcl) .... Take 1 tablet by mouth two times a day 4)  Norvasc 10 Mg Tabs (Amlodipine besylate) .... Take 1 tablet by mouth once a day 5)  Pravachol 40 Mg Tabs (Pravastatin sodium) .... Take 2 tablets by mouth once a day 6)  Coreg 25 Mg Tabs (Carvedilol) .... Take 1 tablet by mouth two times a day 7)  Furosemide 40 Mg Tabs (Furosemide)  .... Take 1 tablet by mouth once twice daily 8)  Clonidine Hcl 0.3 Mg Tabs (Clonidine hcl) .... Take 1 tablet by mouth two times a day 9)  Aspirin 81 Mg Tbec (Aspirin) .... Take one tablet by mouth daily 10)  Hydrochlorothiazide 25 Mg Tabs (Hydrochlorothiazide) .... Take one tablet by mouth daily. 11)  Tramadol Hcl 50 Mg Tabs (Tramadol hcl) .... Take 1 tablet every 8 hour for pain as needed.  Patient Instructions: 1)  Please schedule a follow-up appointment in 3 months. 2)  Please schedule a follow-up appointment as needed. 3)  It is important that you exercise regularly at least 20 minutes 5 times a week. If you develop chest pain, have severe difficulty breathing, or feel very tired , stop exercising immediately and seek medical attention. 4)  You need to lose weight. Consider a lower calorie diet and regular exercise.  5)  Schedule  your mammogram. 6)  Schedule a colonoscopy/sigmoidoscopy to help detect colon cancer. 7)  Complete your Hemocult cards and return them soon. 8)  You need to have a Pap Smear to prevent cervical cancer. 9)  Take an Aspirin every day. 10)  Check your blood sugars regularly. If your readings are usually above :200  or below 70 you should contact our office. 11)  It is important that your Diabetic A1c level is checked every 3 months. 12)  See your eye doctor yearly to check for diabetic eye damage. 13)  Check your feet each night for sore areas, calluses or signs of infection. 14)  Check your Blood Pressure regularly. If it is above: 140/90 you should make an appointment. 15)  Limit your Sodium (Salt) to less than 2 grams a day(slightly less than 1/2 a teaspoon) to prevent fluid retention, swelling, or worsening of symptoms. Prescriptions: TRAMADOL HCL 50 MG TABS (TRAMADOL HCL) Take 1 tablet every 8 hour for pain as needed.  #90 x 0   Entered and Authorized by:   Janell Quiet MD   Signed by:   Janell Quiet MD on 12/30/2009   Method used:   Print then Give to Patient    RxID:   [id number] HYDROCHLOROTHIAZIDE 25 MG TABS (HYDROCHLOROTHIAZIDE) Take one tablet by mouth daily.  #90 x 4   Entered and Authorized by:   Janell Quiet MD   Signed by:   Janell Quiet MD on 12/30/2009   Method used:   Print then Give to Patient   RxID:   [id number] CLONIDINE HCL 0.3 MG TABS (CLONIDINE HCL) Take 1 tablet by mouth two times a day  #60 x 11   Entered and Authorized by:   Janell Quiet MD   Signed by:   Janell Quiet MD on 12/30/2009   Method used:   Print then Give to Patient   RxID:   [id number] FUROSEMIDE 40 MG  TABS (FUROSEMIDE) Take 1 tablet by mouth once twice daily  #60 x 11   Entered and Authorized by:   Janell Quiet MD   Signed by:   Janell Quiet MD on 12/30/2009   Method used:   Print then Give to Patient   RxID:   [id number] COREG 25 MG  TABS (CARVEDILOL) Take 1 tablet by mouth two times a day  #186 x 1   Entered and Authorized by:   Janell Quiet MD   Signed by:   Janell Quiet MD on 12/30/2009   Method used:   Print then Give to Patient   RxID:   [id number] PRAVACHOL 40 MG TABS (PRAVASTATIN SODIUM) Take 2 tablets by mouth once a day  #186 x 1   Entered and Authorized by:   Janell Quiet MD   Signed by:   Janell Quiet MD on 12/30/2009   Method used:   Print then Give to Patient   RxID:   [id number] NORVASC 10 MG  TABS (AMLODIPINE BESYLATE) Take 1 tablet by mouth once a day  #93 x 6   Entered and Authorized by:   Janell Quiet MD   Signed by:   Janell Quiet MD on 12/30/2009   Method used:   Print then Give to Patient   RxID:   [id number] GLUCOPHAGE 1000 MG TABS (METFORMIN HCL) Take 1 tablet by mouth two times a day  #186 x 2   Entered and Authorized by:   Janell Quiet MD   Signed by:   Janell Quiet MD on 12/30/2009  Method used:   Print then Give to Patient   RxID:   [id number] LANTUS 100 UNIT/ML SOLN (INSULIN GLARGINE) Inject 15 units subcutaneously once daily at bedtime  #3 mo. supply x prn   Entered and Authorized by:   Janell Quiet  MD   Signed by:   Janell Quiet MD on 12/30/2009   Method used:   Print then Give to Patient   RxID:   [id number] BENAZEPRIL HCL 40 MG TABS (BENAZEPRIL HCL) Take 1 tablet by mouth once daily  #93 x 11   Entered and Authorized by:   Janell Quiet MD   Signed by:   Janell Quiet MD on 12/30/2009   Method used:   Print then Give to Patient   RxID:   [id number]  Process Orders Check Orders Results:     [facility]: Order checked:     Janell Quiet MD NOT AUTHORIZED TO ORDER Tests Sent for requisitioning (December 30, 2009 9:58 AM):     12/30/2009: [facility] -- T-Lipid Profile [phone] (signed)     12/30/2009: [facility] -- T-Comprehensive Metabolic Panel [id number] (signed)    Prevention & Chronic Care Immunizations   Influenza vaccine: refuses  (07/23/2008)   Influenza vaccine deferral: Deferred  (09/26/2009)    Tetanus booster: Not documented    Pneumococcal vaccine: Not documented  Colorectal Screening   Hemoccult: Not documented   Hemoccult action/deferral: Ordered  (09/26/2009)    Colonoscopy: Not documented   Colonoscopy action/deferral: GI referral  (12/30/2009)  Other Screening   Pap smear: Not documented   Pap smear action/deferral: Deferred  (09/26/2009)    Mammogram: No specific mammographic evidence of malignancy.  No significant changes compared to previous study.  Assessment: BIRADS 1.   (03/14/2008)   Mammogram action/deferral: Refused  (09/26/2009)   Mammogram due: 03/2009   Smoking status: never  (12/30/2009)  Diabetes Mellitus   HgbA1C: 6.6  (12/30/2009)   Hemoglobin A1C due: 06/23/2007    Eye exam: No diabetic retinopathy.  OU   (03/19/2009)   Eye exam due: 03/19/2010    Foot exam: yes  (11/14/2007)   Foot exam action/deferral: Do today   High risk foot: No  (09/26/2009)   Foot care education: 12/27/2007  (03/28/2007)   Foot exam due: 05/11/2008    Urine microalbumin/creatinine  ratio: 100.1  (01/21/2007)   Urine microalbumin/cr due: 01/21/2008    Diabetes flowsheet reviewed?: Yes   Progress toward A1C goal: At goal  Lipids   Total Cholesterol: 168  (12/27/2008)   Lipid panel action/deferral: Lipid Panel ordered   LDL: 81  (12/27/2008)   LDL Direct: Not documented   HDL: 42  (12/27/2008)   Triglycerides: 223  (12/27/2008)   Lipid panel due: 12/10/2007    SGOT (AST): 23  (12/27/2008)   SGPT (ALT): 21  (12/27/2008) CMP ordered    Alkaline phosphatase: 55  (12/27/2008)   Total bilirubin: 0.6  (12/27/2008)    Lipid flowsheet reviewed?: Yes   Progress toward LDL goal: At goal  Hypertension   Last Blood Pressure: 194 / 109  (12/30/2009)   Serum creatinine: 1.59  (10/09/2009)   Serum potassium 4.0  (10/09/2009) CMP ordered     Hypertension flowsheet reviewed?: Yes   Progress toward BP goal: At goal  Self-Management Support :    Diabetes self-management support: Copy of home glucose meter record, Written self-care plan, Education handout, Referred for DM self-management training  (12/30/2009)   Diabetes care plan printed   Diabetes education  handout printed   Referred.    Hypertension self-management support: BP self-monitoring log, Education handout, Pre-printed educational material, Written self-care plan  (12/30/2009)   Hypertension self-care plan printed.   Hypertension education handout printed    Lipid self-management support: Not documented    Nursing Instructions: Diabetic foot exam today Give Flu vaccine today Give tetanus booster today GI referral for screening colonoscopy (see order) Refer for diabetes self-management training (see order)   Laboratory Results   Blood Tests   Date/Time Received: December 30, 2009 8:54 AM  Date/Time Reported: Lenoria Farrier  December 30, 2009 8:54 AM   HGBA1C: 6.6%   (Normal Range: Non-Diabetic - 3-6%   Control Diabetic - 6-8%) CBG Fasting:: 120mg /dL     Diabetes Self Management Training  Referral Patient Name: Kristy [name] Date Of Birth: 06/10/1956 MRN: [id number] Current Diagnosis:  SPECIAL SCREENING FOR MALIGNANT NEOPLASMS COLON (ICD-V76.51) PNEUMONIA, COMMUNITY ACQUIRED, PNEUMOCOCCAL (ICD-481) MONONEURITIS, LEG (ICD-355.8) RENAL ARTERY STENOSIS (ICD-440.1) LEG EDEMA, BILATERAL (ICD-782.3) LEG PAIN, BILATERAL (ICD-729.5) ALLERGIC RHINITIS, SEASONAL (ICD-477.0) PREVENTIVE HEALTH CARE (ICD-V70.0) MICROALBUMINURIA (ICD-791.0) DIABETES MELLITUS, TYPE II (ICD-250.00) TRANSAMINASES, SERUM, ELEVATED (ICD-790.4) ABSCESS, TOOTH (ICD-522.5) HYPERLIPIDEMIA (ICD-272.4) TRICUSPID REGURGITATION, MILD (ICD-397.0) VENTRICULAR HYPERTROPHY, LEFT (ICD-429.3) HYPERTENSION, MALIGNANT, UNCONTROLLED (123456)   Complicating Conditions:  HTN   Appended Document: immunizations//kg    Nurse Visit   Allergies: No Known Drug Allergies  Immunizations Administered:  Tetanus Vaccine:    Vaccine Type: Tdap    Site: right deltoid    Mfr: GlaxoSmithKline    Dose: 0.5 ml    Route: IM    Given by: Nadine Counts ([place])    Exp. Date: 01/18/2012    Lot #: [id number]    VIS given: 09/13/07 version given December 30, 2009.  Orders Added: 1)  Flu Vaccine 49yrs + [90658] 2)  Tdap => 46yrs IM [id number] 3)  Admin 1st Vaccine [90471] 4)  Admin of Any Addtl Vaccine [id number] Flu Vaccine Consent Questions     Do you have a history of severe allergic reactions to this vaccine? no    Any prior history of allergic reactions to egg and/or gelatin? no    Do you have a sensitivity to the preservative Thimersol? no    Do you have a past history of Guillan-Barre Syndrome? no    Do you currently have an acute febrile illness? no    Have you ever had a severe reaction to latex? no    Vaccine information given and explained to patient? yes    Are you currently pregnant? no    Lot [id number] 4p   Exp Date:01/2010   Manufacturer: Novartis    Site Given  Left Deltoid IM.Nadine Counts [facility])   December 30, 2009 3:16 PM

## 2010-11-25 NOTE — Miscellaneous (Signed)
Summary: Audio- Visual Observation & Taping  Audio- Visual Observation & Taping   Imported By: Bonner Puna 01/02/2010 13:43:23  _____________________________________________________________________  External Attachment:    Type:   Image     Comment:   External Document

## 2010-11-25 NOTE — Letter (Signed)
Summary: MeterDown Load  MeterDown Load   Imported By: Bonner Puna 06/13/2010 15:53:54  _____________________________________________________________________  External Attachment:    Type:   Image     Comment:   External Document

## 2010-11-25 NOTE — Miscellaneous (Signed)
Summary: Orders Update  Clinical Lists Changes  Orders: Added new Test order of Renal Artery Duplex (Renal Artery Duplex) - Signed 

## 2010-11-25 NOTE — Progress Notes (Signed)
Summary: refill/ hla  Phone Note Refill Request Message from:  Patient on April 09, 2010 10:00 AM  Refills Requested: Medication #1:  TRAMADOL HCL 50 MG TABS Take 1 tablet every 8 hour for pain as needed..   Dosage confirmed as above?Dosage Confirmed Initial call taken by: Freddy Finner RN,  April 09, 2010 10:00 AM  Follow-up for Phone Call        completed refill, thank you Yannick Steuber  Follow-up by: Trinidad Curet MD,  April 10, 2010 11:32 PM    Prescriptions: TRAMADOL HCL 50 MG TABS (TRAMADOL HCL) Take 1 tablet every 8 hour for pain as needed.  #90 x 5   Entered and Authorized by:   Trinidad Curet MD   Signed by:   Trinidad Curet MD on 04/10/2010   Method used:   Electronically to        [facility] [phone]* (retail)       [address]       [place], [place]  [address]       Ph: [phone]       Fax: [phone]   RxID:   [id number]

## 2010-11-25 NOTE — Letter (Signed)
Summary: Moviprep Instructions  [facility]  [address]   [place], [place] [address]   Phone: [phone]  Fax: [phone]       Kristy [name]    1956/09/22    MRN: [id number]        Procedure Day /Date: Monday 02-03-10     Arrival Time: 9:30 a.m.      Procedure Time: 10:30 a.m.     Location of Procedure:                    x  [place] (4th Floor)                        [place]   Starting 5 days prior to your procedure 01-29-10 do not eat nuts, seeds, popcorn, corn, beans, peas,  salads, or any raw vegetables.  Do not take any fiber supplements (e.g. Metamucil, Citrucel, and Benefiber).  THE DAY BEFORE YOUR PROCEDURE         DATE: 02-02-10   DAY: Sunday  1.  Drink clear liquids the entire day-NO SOLID FOOD  2.  Do not drink anything colored red or purple.  Avoid juices with pulp.  No orange juice.  3.  Drink at least 64 oz. (8 glasses) of fluid/clear liquids during the day to prevent dehydration and help the prep work efficiently.  CLEAR LIQUIDS INCLUDE: Water Jello Ice Popsicles Tea (sugar ok, no milk/cream) Powdered fruit flavored drinks Coffee (sugar ok, no milk/cream) Gatorade Juice: apple, white grape, white cranberry  Lemonade Clear bullion, consomm, broth Carbonated beverages (any kind) Strained chicken noodle soup Hard Candy                             4.  In the morning, mix first dose of MoviPrep solution:    Empty 1 Pouch A and 1 Pouch B into the disposable container    Add lukewarm drinking water to the top line of the container. Mix to dissolve    Refrigerate (mixed solution should be used within 24 hrs)  5.  Begin drinking the prep at 5:00 p.m. The MoviPrep container is divided by 4 marks.   Every 15 minutes drink the solution down to the next mark (approximately 8 oz) until the full liter is complete.   6.  Follow completed prep with 16 oz of clear liquid of your choice (Nothing  red or purple).  Continue to drink clear liquids until bedtime.  7.  Before going to bed, mix second dose of MoviPrep solution:    Empty 1 Pouch A and 1 Pouch B into the disposable container    Add lukewarm drinking water to the top line of the container. Mix to dissolve    Refrigerate  THE DAY OF YOUR PROCEDURE      DATE: 02-03-10  DAY: Monday  Beginning at  5:30 a.m. (5 hours before procedure):         1. Every 15 minutes, drink the solution down to the next mark (approx 8 oz) until the full liter is complete.  2. Follow completed prep with 16 oz. of clear liquid of your choice.    3. You may drink clear liquids until 8:30 a.m.  (2 HOURS BEFORE PROCEDURE).   MEDICATION INSTRUCTIONS  Unless otherwise instructed, you should take regular prescription medications with a small sip of water   as early as  possible the morning of your procedure.  Additional medication instructions: Hold Furosemide the morning of procedure.  See diabetic instructions:         OTHER INSTRUCTIONS  You will need a responsible adult at least 55 years of age to accompany you and drive you home.   This person must remain in the waiting room during your procedure.  Wear loose fitting clothing that is easily removed.  Leave jewelry and other valuables at home.  However, you may wish to bring a book to read or  an iPod/MP3 player to listen to music as you wait for your procedure to start.  Remove all body piercing jewelry and leave at home.  Total time from sign-in until discharge is approximately 2-3 hours.  You should go home directly after your procedure and rest.  You can resume normal activities the  day after your procedure.  The day of your procedure you should not:   Drive   Make legal decisions   Operate machinery   Drink alcohol   Return to work  You will receive specific instructions about eating, activities and medications before you leave.    The above instructions have  been reviewed and explained to me by   Emerson Monte RN  January 20, 2010 8:27 AM     I fully understand and can verbalize these instructions _____________________________ Date _________

## 2010-11-25 NOTE — Letter (Signed)
Summary: Diabetic Instructions  [facility]  [place], [place] [address]   Phone: [phone]  Fax: [phone]    Kristy [name] 09-Mar-1956 MRN: [id number]   _x  _   ORAL DIABETIC MEDICATION INSTRUCTIONS  The day before your procedure:   Take your diabetic pill as you do normally  The day of your procedure:   Do not take your diabetic pill    We will check your blood sugar levels during the admission process and again in Recovery before discharging you home  ________________________________________________________________________  _x  _   INSULIN (LONG ACTING) MEDICATION INSTRUCTIONS (Lantus, NPH, 70/30, Humulin, Novolin-N)   The day before your procedure:   Take  your regular evening dose    The day of your procedure:   Do not take your morning dose

## 2010-11-25 NOTE — Miscellaneous (Signed)
Summary: [facility] Previsit/prep  Clinical Lists Changes  Medications: Added new medication of MOVIPREP 100 GM  SOLR (PEG-KCL-NACL-NASULF-NA ASC-C) As per prep instructions. - Signed Rx of MOVIPREP 100 GM  SOLR (PEG-KCL-NACL-NASULF-NA ASC-C) As per prep instructions.;  #1 x 0;  Signed;  Entered by: Emerson Monte RN;  Authorized by: Milus Banister MD;  Method used: Electronically to [facility] [phone]*, [address], [place], [place]  [address], Ph: [phone], Fax: [phone] Observations: Added new observation of NKA: T (01/20/2010 7:50)    Prescriptions: MOVIPREP 100 GM  SOLR (PEG-KCL-NACL-NASULF-NA ASC-C) As per prep instructions.  #1 x 0   Entered by:   Emerson Monte RN   Authorized by:   Milus Banister MD   Signed by:   Emerson Monte RN on 01/20/2010   Method used:   Electronically to        [facility] [phone]* (retail)       [address]       [place], [place]  [address]       Ph: [phone]       Fax: [phone]   RxID:   [id number]

## 2010-11-25 NOTE — Procedures (Signed)
Summary: Colonoscopy  Patient: [name] Note: All result statuses are Final unless otherwise noted.  Tests: (1) Colonoscopy (COL)   COL Colonoscopy           [facility]     [place], [place]  [address]           COLONOSCOPY PROCEDURE REPORT     PATIENT:  Kristy [name], Kristy [name]  MR#:  [id number]     BIRTHDATE:  [birth year], 53 yrs. old  GENDER:  female     ENDOSCOPIST:  Milus Banister, MD     REF. BY:  Adrian Prows, M.D.     PROCEDURE DATE:  02/03/2010     PROCEDURE:  Colonoscopy with snare polypectomy     ASA CLASS:  Class II     INDICATIONS:  Routine Risk Screening     MEDICATIONS:   Fentanyl 75 mcg IV, Versed 7 mg IV     DESCRIPTION OF PROCEDURE:   After the risks benefits and     alternatives of the procedure were thoroughly explained, informed     consent was obtained.  Digital rectal exam was performed and     revealed no rectal masses.   The LB PCF-[id number] endoscope     was introduced through the anus and advanced to the cecum, which     was identified by both the appendix and ileocecal valve, without     limitations.  The quality of the prep was good, using MoviPrep.     The instrument was then slowly withdrawn as the colon was fully     examined.     <<PROCEDUREIMAGES>>           FINDINGS:  A sessile polyp was found in the ascending colon. This     was 2mm across, removed with cold snare, sent to pathology (jar 1)     (see image6 and image7).  Mild diverticulosis was found in the     sigmoid to descending colon segments (see image1).  This was     otherwise a normal examination of the colon (see image3, image5,     and image8).   Retroflexed views in the rectum revealed no     abnormalities.    The scope was then withdrawn from the patient     and the procedure completed.     COMPLICATIONS:  None           ENDOSCOPIC IMPRESSION:     1) Small sessile polyp in the ascending colon; removed and sent     to pathology     2) Mild  diverticulosis in the sigmoid to descending colon     segments     3) Otherwise normal examination           RECOMMENDATIONS:     1) If the polyp(s) removed today are proven to be adenomatous     (pre-cancerous) polyps, you will need a repeat colonoscopy in 5     years. Otherwise you should continue to follow colorectal cancer     screening guidelines for "routine risk" patients with colonoscopy     in 10 years.     2) You will receive a letter within 1-2 weeks with the results     of your biopsy as well as final recommendations. Please call my     office if you have not received a letter after 3 weeks.  ______________________________     Milus Banister, MD           n.     eSIGNED:   Milus Banister at 02/03/2010 10:20 AM           [name], [id number]  Note: An exclamation mark (!) indicates a result that was not dispersed into the flowsheet. Document Creation Date: 02/03/2010 10:21 AM _______________________________________________________________________  (1) Order result status: Final Collection or observation date-time: 02/03/2010 10:17 Requested date-time:  Receipt date-time:  Reported date-time:  Referring Physician:   Ordering Physician: Owens Loffler [phone]) Specimen Source:  Source: Tawanna Cooler Order Number: [id number] Lab site:   Appended Document: Colonoscopy     Procedures Next Due Date:    Colonoscopy: 01/2015

## 2010-11-25 NOTE — Assessment & Plan Note (Signed)
Summary: f1y   Visit Type:  1 year follow up Primary Provider:  Trinidad Curet MD  CC:  Bilateral leg pain.  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 She underwent renal artery angioplasty in 2007 and had a good angiographic result, but did not have a good BP response.    She presents today for follow-up evaluation. She has made some changes to her antihypertensive regimen...oral clonidine has been changed to a clonidine patch and her furosemide has been doubled to 80 mg twice daily. She checks her BP occasionally at [facility] and states her systolic BP's have ranged from 140-160 mmHg.  She denies chest pain, dyspnea, or edema. Complains of burning leg pain. No other complaints at present.     Current Medications (verified): 1)  Benazepril Hcl 40 Mg Tabs (Benazepril Hcl) .... Take 1 Tablet By Mouth Once Daily 2)  Lantus 100 Unit/ml Soln (Insulin Glargine) .... Inject 15 Units Subcutaneously Once Daily At Bedtime 3)  Glucophage 1000 Mg Tabs (Metformin Hcl) .... Take 1 Tablet By Mouth Two Times A Day 4)  Norvasc 10 Mg  Tabs (Amlodipine Besylate) .... Take One Half Tablet By Mouth Once A Day 5)  Pravachol 40 Mg Tabs (Pravastatin Sodium) .... Take 2 Tablets By Mouth Once A Day 6)  Coreg 25 Mg  Tabs (Carvedilol) .... Take 1 Tablet By Mouth Two Times A Day 7)  Furosemide 40 Mg  Tabs (Furosemide) .... Take 1 Tablet By Mouth Once Twice Daily 8)  Clonidine Hcl 0.3 Mg/24hr Ptwk (Clonidine Hcl) .... Once A Week 9)  Aspirin 81 Mg Tbec (Aspirin) .... Take One Tablet By Mouth Daily 10)  Tramadol Hcl 50 Mg Tabs (Tramadol Hcl) .... Take 1 Tablet Every 8 Hour For Pain As Needed.  Allergies (verified): No Known Drug Allergies  Past History:  Past medical history reviewed for relevance to current acute and chronic problems.  Past Medical History: Reviewed history from 06/20/2009 and no changes  required. 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  Review of Systems       Negative except as per HPI   Vital Signs:  Patient profile:   55 year old female Height:      63 inches Weight:      221.50 pounds BMI:     39.38 Pulse rate:   88 / minute Pulse rhythm:   regular Resp:     18 per minute BP sitting:   174 / 110  (left arm) Cuff size:   large  Vitals Entered By: Sidney Ace (June 09, 2010 3:42 PM)  Serial Vital Signs/Assessments:  Time      Position  BP       Pulse  Resp  Temp     By           R Arm     168/100                        Sidney Ace   Physical Exam  General:  Pt is alert and oriented, in no acute distress. HEENT: normal Neck: normal carotid upstrokes without bruits, JVP normal Lungs: CTA CV: RRR without murmur or gallop Abd: soft, NT, positive  BS, no bruit, no organomegaly Ext: no clubbing, cyanosis, or edema. peripheral pulses 2+ and equal Skin: warm and dry without rash    Arterial Doppler  Procedure date:  11/18/2009  Findings:      Renal Doppler: normal bilateral renal size and normal renal arterial velocities bilaterally.  Impression & Recommendations:  Problem # 1:  HYPERTENSION, MALIGNANT, UNCONTROLLED (ICD-401.0) BP remains markedly elevated. I think she has a component of white-coat HTN, but with BP's in this range I'm certain her control at home is not good. Recommend increase amolidipine to 10 mg daily and add hydralazine 25 mg three times a day. Otherwise continue current medical therapy. She follows at the Internal medicine clinic and also with Dr Moshe Cipro at [facility] as well. Her renal duplex from earlier this year was reviewed and showed no  evidence of recurrent renal artery stenosis.  The following medications were removed from the medication list:    Hydrochlorothiazide 25 Mg Tabs (Hydrochlorothiazide) .Marland Kitchen... Take one tablet by mouth daily. Her updated medication list for this problem includes:    Benazepril Hcl 40 Mg Tabs (Benazepril hcl) .Marland Kitchen... Take 1 tablet by mouth once daily    Norvasc 10 Mg Tabs (Amlodipine besylate) .Marland Kitchen... Take one tablet by mouth once a day    Coreg 25 Mg Tabs (Carvedilol) .Marland Kitchen... Take 1 tablet by mouth two times a day    Furosemide 40 Mg Tabs (Furosemide) .Marland Kitchen... Take 1 tablet by mouth once twice daily    Clonidine Hcl 0.3 Mg/24hr Ptwk (Clonidine hcl) ..... Once a week    Aspirin 81 Mg Tbec (Aspirin) .Marland Kitchen... Take one tablet by mouth daily    Hydralazine Hcl 25 Mg Tabs (Hydralazine hcl) .Marland Kitchen... Take one tablet by mouth three times a day  Problem # 2:  RENAL ARTERY STENOSIS (ICD-440.1) As above - renal duplex showed patency of both renal arteries with normal velocities.  Patient Instructions: 1)  Your physician has recommended you make the following change in your medication: INCREASE Amlodipine to 10mg  take one tablet by mouth daily, START Hydralazine 25mg  one tablet by mouth three times a day  2)  Your physician wants you to follow-up in:  6 MONTHS. You will receive a reminder letter in the mail two months in advance. If you don't receive a letter, please call our office to schedule the follow-up appointment. Prescriptions: HYDRALAZINE HCL 25 MG TABS (HYDRALAZINE HCL) Take one tablet by mouth three times a day  #270 x 3   Entered by:   Theodosia Quay, RN, BSN   Authorized by:   Neale Burly, MD   Signed by:   Theodosia Quay, RN, BSN on 06/09/2010   Method used:   Electronically to        [facility] [phone]* (retail)       [place], [place]  [address]       Ph: [phone]       Fax: [phone]   [id number] [place] 10 MG  TABS (AMLODIPINE BESYLATE) Take one  tablet by mouth once a day  #90 x 3   Entered by:   Theodosia Quay, RN, BSN   Authorized by:   Neale Burly, MD   Signed by:   Theodosia Quay, RN, BSN on 06/09/2010   Method used:   Electronically to        [facility] [phone]* (retail)       [address]  [place], [place]  [address]       Ph: [phone]       Fax: [phone]   RxID:   [id number]

## 2010-11-25 NOTE — Letter (Signed)
Summary: Previsit letter  [facility]  [place], [place] [address]   Phone: [phone]  Fax: [phone]       01/06/2010 MRN: [id number]  [facility] [address] [place],   [address]  Dear Ms. Hobby,  Welcome to the Gastroenterology Division at [facility].    You are scheduled to see a nurse for your pre-procedure visit on 01-20-10 at 8:00a.m. on the 3rd floor at [facility] [place] [employer] [facility] [employer].  We ask that you try to arrive at our office 15 minutes prior to your appointment time to allow for check-in.  Your nurse visit will consist of discussing your medical and surgical history, your immediate family medical history, and your medications.    Please bring a complete list of all your medications or, if you prefer, bring the medication bottles and we will list them.  We will need to be aware of both prescribed and over the counter drugs.  We will need to know exact dosage information as well.  If you are on blood thinners (Coumadin, Plavix, Aggrenox, Ticlid, etc.) please call our office today/prior to your appointment, as we need to consult with your physician about holding your medication.   Please be prepared to read and sign documents such as consent forms, a financial agreement, and acknowledgement forms.  If necessary, and with your consent, a friend or relative is welcome to sit-in on the nurse visit with you.  Please bring your insurance card so that we may make a copy of it.  If your insurance requires a referral to see a specialist, please bring your referral form from your primary care physician.  No co-pay is required for this nurse visit.     If you cannot keep your appointment, please call [phone] to cancel or reschedule prior to your appointment date.  This allows [place] the opportunity to schedule an appointment for another patient in need of care.    Thank you for choosing [facility] for your medical needs.   We appreciate the opportunity to care for you.  Please visit [place] at our website  to learn more about our practice.                     Sincerely.                                                                                                                   The Gastroenterology Division

## 2010-11-25 NOTE — Letter (Signed)
Summary: [facility] Office Note  [facility] Office Note   Imported By: Sallee [place] 03/11/2010 16:13:14  _____________________________________________________________________  External Attachment:    Type:   Image     Comment:   External Document

## 2010-11-26 ENCOUNTER — Ambulatory Visit: Admit: 2010-11-26 | Payer: Self-pay

## 2010-11-27 NOTE — Assessment & Plan Note (Signed)
Summary: EST-3 MONTH F/U VISIT/CH   Vital Signs:  Patient profile:   55 year old female Height:      63 inches (160.02 cm) Weight:      215.0 pounds (97.73 kg) BMI:     38.22 Temp:     97.1 degrees F (36.17 degrees C) oral Pulse rate:   97 / minute BP sitting:   138 / 83  (left arm)  Vitals Entered By: Hilda Blades Ditzler RN (September 30, 2010 2:57 PM) Is Patient Diabetic? Yes Did you bring your meter with you today? Yes Pain Assessment Patient in pain? yes     Location: leg Intensity: 5 Type: strong feeling Onset of pain  long time Nutritional Status BMI of > 30 = obese Nutritional Status Detail appetite good CBG Result 148  Have you ever been in a relationship where you felt threatened, hurt or afraid?denies   Does patient need assistance? Functional Status Self care Ambulation Normal Comments FU   Primary Care Royetta Probus:  Trinidad Curet MD   History of Present Illness: 55 yo female with PMH outlined below presents to [place] for regular follow up appointment. She has no concerns at the time. No recent sicknesses or hospitalizaitons. No episodes of chest pain, SOB, palpitations, no fever or chills. No specific abdominal or urinary concerns. No recent changes in appetite, weight, sleep patterns, mood.  She reports compliance with her medications for high blood pressure and diabetes, cholesterol, she has had recent colonoscopy and is due for PAP smear but would like to address this issue on her next visit.    Depression History:      The patient denies a depressed mood most of the day and a diminished interest in her usual daily activities.  The patient denies significant weight loss, significant weight gain, insomnia, hypersomnia, psychomotor agitation, psychomotor retardation, fatigue (loss of energy), feelings of worthlessness (guilt), impaired concentration (indecisiveness), and recurrent thoughts of death or suicide.        The patient denies that she feels like life is not  worth living, denies that she wishes that she were dead, and denies that she has thought about ending her life.         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.8) 3)  Hypertension, Malignant, Uncontrolled  (ICD-401.0) 4)  Leg Edema, Bilateral  (ICD-782.3) 5)  Leg Pain, Bilateral  (ICD-729.5) 6)  Allergic Rhinitis, Seasonal  (ICD-477.0) 7)  Preventive Health Care  (ICD-V70.0) 8)  Diabetes Mellitus, Type II  (ICD-250.00) 9)  Renal Artery Stenosis  (ICD-440.1) 10)  Microalbuminuria  (ICD-791.0) 11)  Abscess, Tooth  (ICD-522.5) 12)  Transaminases, Serum, Elevated  (ICD-790.4) 13)  Hyperlipidemia  (ICD-272.4) 14)  Tricuspid Regurgitation, Mild  (ICD-397.0) 15)  Ventricular Hypertrophy, Left  (ICD-429.3)  Medications Prior to Update: 1)  Benazepril Hcl 40 Mg Tabs (Benazepril Hcl) .... Take 1 Tablet By Mouth Once Daily 2)  Lantus 100 Unit/ml Soln (Insulin Glargine) .... Inject 15 Units Subcutaneously Once Daily At Bedtime 3)  Glucophage 1000 Mg Tabs (Metformin Hcl) .... Take 1 Tablet By Mouth Two Times A Day 4)  Norvasc 10 Mg  Tabs (Amlodipine Besylate) .... Take One Tablet By Mouth Once A Day  5)  Pravachol 40 Mg Tabs (Pravastatin Sodium) .... Take 2 Tablets By Mouth Once A Day 6)  Coreg 25 Mg  Tabs (Carvedilol) .... Take 1 Tablet By Mouth Two Times A Day 7)  Furosemide 40 Mg  Tabs (Furosemide) .... Take 1 Tablet By Mouth Once Twice Daily 8)  Clonidine Hcl 0.3 Mg/24hr Ptwk (Clonidine Hcl) .... Once A Week 9)  Aspirin 81 Mg Tbec (Aspirin) .... Take One Tablet By Mouth Daily 10)  Tramadol Hcl 50 Mg Tabs (Tramadol Hcl) .... Take 1 Tablet Every 8 Hour For Pain As Needed. 11)  Hydralazine Hcl 25 Mg Tabs (Hydralazine Hcl)  .... Take One Tablet By Mouth Three Times A Day  Current Medications (verified): 1)  Benazepril Hcl 40 Mg Tabs (Benazepril Hcl) .... Take 1 Tablet By Mouth Once Daily 2)  Lantus 100 Unit/ml Soln (Insulin Glargine) .... Inject 15 Units Subcutaneously Once Daily At Bedtime 3)  Glucophage 1000 Mg Tabs (Metformin Hcl) .... Take 1 Tablet By Mouth Two Times A Day 4)  Norvasc 10 Mg  Tabs (Amlodipine Besylate) .... Take One Tablet By Mouth Once A Day 5)  Pravachol 40 Mg Tabs (Pravastatin Sodium) .... Take 2 Tablets By Mouth Once A Day 6)  Coreg 25 Mg  Tabs (Carvedilol) .... Take 1 Tablet By Mouth Two Times A Day 7)  Furosemide 40 Mg  Tabs (Furosemide) .... Take 1 Tablet By Mouth Once Twice Daily 8)  Clonidine Hcl 0.3 Mg/24hr Ptwk (Clonidine Hcl) .... Once A Week 9)  Aspirin 81 Mg Tbec (Aspirin) .... Take One Tablet By Mouth Daily 10)  Tramadol Hcl 50 Mg Tabs (Tramadol Hcl) .... Take 1 Tablet Every 8 Hour For Pain As Needed. 11)  Hydralazine Hcl 25 Mg Tabs (Hydralazine Hcl) .... 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  Past Surgical History: Last updated: 06/19/2009  Selective bilateral renal angiography, balloon angioplasty of  the left renal artery.   03/03/2006 Multi. teeth extraction   12/14/2006  Family History: Last updated: 06/19/2009   Mother is alive, with a history of diabetes mellitus.  Father is alive. with a history of diabetes mellitus..  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  Patient with a history of  smoking 1 pack per day for 35 years.  Patient with no use of alcohol by  report.  The patient does have a history of use of tetrahydrocannabinol.  Risk Factors: Exercise: no (09/30/2010)  Risk Factors: Smoking Status: never (09/30/2010) Passive Smoke Exposure: no (09/30/2010)  Family History: Reviewed history from 06/19/2009 and no changes required.   Mother is alive, with a history of diabetes mellitus.  Father is alive. with a history of diabetes mellitus..  Social History: Reviewed history from 06/19/2009 and no changes required. 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  Patient with a history of  smoking 1 pack per day for 35 years.  Patient with no  use of alcohol by  report.  The patient does have a history of use of tetrahydrocannabinol.  Review of Systems       per HPI  Physical Exam  General:  Well-developed,well-nourished,in no acute distress; alert,appropriate and cooperative throughout examination Lungs:  normal respiratory effort, no intercostal retractions, no accessory muscle use, and normal breath sounds.   Heart:  normal rate and regular rhythm.   Abdomen:  soft, non-tender, normal bowel sounds, and no distention.   Neurologic:  No cranial nerve deficits noted. Station and gait are normal. Plantar reflexes are down-going bilaterally. DTRs are symmetrical throughout. Sensory, motor and coordinative functions appear intact. Psych:  Cognition and judgment appear intact. Alert and cooperative with normal attention span and concentration. No apparent delusions, illusions, hallucinations   Impression & Recommendations:  Problem # 1:  DIABETES MELLITUS, TYPE II (ICD-250.00) Improvement in A1C, good compliance, her CBGs are within target range, foot exam is WNL and she is up to date on her yearly eye  examintaions. Will continue the same regimen.  Her updated medication list for this problem includes:    Lantus 100 Unit/ml Soln (Insulin glargine) ..... Inject 15 units subcutaneously once daily at bedtime    Glucophage 1000 Mg Tabs (Metformin hcl) .Marland Kitchen... Take 1 tablet by mouth two times a day   Orders: T- Capillary Blood Glucose [id number]) T-Hgb A1C (in-house) [id number])  Labs Reviewed: Creat: 1.17 (12/30/2009)     Last Eye Exam: No diabetic retinopathy.  OU  (03/19/2009) Reviewed HgBA1c results: 6.6 (09/30/2010)  6.9 (06/10/2010)  Problem # 2:  HYPERTENSION, MALIGNANT, UNCONTROLLED (ICD-401.0) Pt is multpile medications for her BP control, she is managing her BP diary at home and has showed me wonderful booklet where she records her daily BP levels and all of herr number are on 120-140/80-90 range. She has excellent habit of recording BP and is strict with her diet and exercise. I have complimented her on her effort and reassured her to continue the same. No changes in the regimen for now. Her updated medication list for this problem includes:    Benazepril Hcl 40 Mg Tabs (Benazepril hcl) .Marland Kitchen... Take 1 tablet by mouth once daily    Norvasc 10 Mg Tabs (Amlodipine besylate) .Marland Kitchen... Take one tablet by mouth once a day    Coreg 25 Mg Tabs (Carvedilol) .Marland Kitchen... Take 1 tablet by mouth two times a day    Furosemide 40 Mg Tabs (Furosemide) .Marland Kitchen... Take 1 tablet by mouth once twice daily    Clonidine Hcl 0.3 Mg/24hr Ptwk (Clonidine hcl) ..... Once a week    Hydralazine Hcl 25 Mg Tabs (Hydralazine hcl) .Marland Kitchen... Take one tablet by mouth three times a day  Orders: T-Basic Metabolic Panel ([id number])  Problem # 3:  HYPERLIPIDEMIA (B2193296.4) LDL outside the target range, she reports compliance with her medication so will reasses the FLP on her next visit and will readjust the regimen as indicated.  Her updated medication list for this problem includes:    Pravachol 40 Mg Tabs (Pravastatin sodium) .Marland Kitchen...  Take 2 tablets by mouth once a day  Labs Reviewed: SGOT: 12 (12/30/2009)   SGPT: 14 (12/30/2009)   HDL:46 (12/30/2009), 42 (12/27/2008)  LDL:125 (12/30/2009), 81 (12/27/2008)  Chol:193 (12/30/2009), 168 (12/27/2008)  Trig:112 (12/30/2009), 223 (12/27/2008)  Complete Medication List: 1)  Benazepril Hcl 40 Mg Tabs (Benazepril hcl) .... Take 1 tablet by mouth once daily 2)  Lantus 100 Unit/ml Soln (Insulin glargine) .... Inject 15 units subcutaneously once daily at bedtime 3)  Glucophage 1000 Mg Tabs (Metformin hcl) .... Take 1 tablet by mouth two times a day 4)  Norvasc 10 Mg Tabs (Amlodipine besylate) .... Take one tablet by mouth once a day 5)  Pravachol 40 Mg Tabs (Pravastatin sodium) .... Take 2 tablets by mouth once a day 6)  Coreg 25 Mg Tabs (Carvedilol) .... Take 1 tablet by mouth two times a day 7)  Furosemide 40 Mg Tabs (Furosemide) .... Take 1 tablet by mouth once twice daily 8)  Clonidine Hcl 0.3 Mg/24hr Ptwk (Clonidine hcl) .... Once a week 9)  Aspirin 81 Mg Tbec (Aspirin) .... Take one tablet by mouth daily 10)  Tramadol Hcl 50 Mg Tabs (Tramadol hcl) .... Take 1 tablet every 8 hour for pain as needed. 11)  Hydralazine Hcl 25 Mg Tabs (Hydralazine hcl) .... Take one tablet by mouth three times a day  Other Orders: Influenza Vaccine NON MCR [id number])  Patient Instructions: 1)  Please schedule a follow-up appointment in 3 months. 2)  Please check your blood pressure regularly, if it is >170 please call clinic at [phone] 3)  Please check your sugar levels regularly and remember to bring the meter with you to the next clinic appointment, if the sugars are > 350 or < 60 please call us at [phone] Prescriptions: TRAMADOL HCL 50 MG TABS (TRAMADOL HCL) Take 1 tablet every 8 hour for pain as needed.  #90 x 5   Entered and Authorized by:   Trinidad Curet MD   Signed by:   Trinidad Curet MD on 10/05/2010   Method used:   Faxed to ...       [facility] (mail-order)             , [place]         Ph:  [phone]       Fax: [phone]   RxID:   [id number] HYDRALAZINE HCL 25 MG TABS (HYDRALAZINE HCL) Take one tablet by mouth three times a day  #270 x 3   Entered and Authorized by:   Trinidad Curet MD   Signed by:   Trinidad Curet MD on 10/05/2010   Method used:   Faxed to ...       [facility] (mail-order)             , [place]         Ph: [phone]       Fax: [phone]   RxID:   [id number] FUROSEMIDE 40 MG  TABS (FUROSEMIDE) Take 1 tablet by mouth once twice daily  #60 x 11   Entered and Authorized by:   Trinidad Curet MD   Signed by:   Trinidad Curet MD on 10/05/2010   Method used:   Faxed to ...       [address] [facility] (mail-order)             , [place]         Ph: [phone]       Fax: [phone]   RxID:   [id number] COREG 25 MG  TABS (CARVEDILOL) Take 1 tablet by mouth two times a day  #186 x 3   Entered and Authorized by:   Trinidad Curet MD   Signed by:   Trinidad Curet MD on 10/05/2010   Method used:   Faxed to ...       [facility] (mail-order)             , [place]         Ph: [phone]  Fax: [phone]   [id number] PRAVACHOL 40 MG TABS (PRAVASTATIN SODIUM) Take 2 tablets by mouth once a day  #186 x 3   Entered and Authorized by:   Trinidad Curet MD   Signed by:   Trinidad Curet MD on 10/05/2010   Method used:   Faxed to ...       [facility] (mail-order)             , [place]         Ph: [phone]       Fax: [phone]   RxID:   [id number] NORVASC 10 MG  TABS (AMLODIPINE BESYLATE) Take one tablet by mouth once a day  #90 x 3   Entered and Authorized by:   Trinidad Curet MD   Signed by:   Trinidad Curet MD on 10/05/2010   Method used:   Faxed to ...       [facility] (mail-order)             , [place]         Ph: [phone]       Fax: [phone]   RxID:   [id number] GLUCOPHAGE 1000 MG TABS (METFORMIN HCL) Take 1 tablet by mouth two times a day  #186 x 2   Entered and Authorized by:   Trinidad Curet MD   Signed by:   Trinidad Curet MD on 10/05/2010   Method used:    Faxed to ...       [facility] (mail-order)             , [place]         Ph: [phone]       Fax: [phone]   RxID:   [id number] LANTUS 100 UNIT/ML SOLN (INSULIN GLARGINE) Inject 15 units subcutaneously once daily at bedtime  #3 mo. supply x prn   Entered and Authorized by:   Trinidad Curet MD   Signed by:   Trinidad Curet MD on 10/05/2010   Method used:   Faxed to ...       [facility] (mail-order)             , [place]         Ph: [phone]       Fax: [phone]   RxID:   [id number]    Orders Added: 1)  T- Capillary Blood Glucose [82948] 2)  T-Hgb A1C (in-house) [83036QW] 3)  Influenza Vaccine NON MCR [00028] 4)  T-Basic Metabolic Panel 0000000 5)  Est. Patient Level III [id number]   Immunizations Administered:  Influenza Vaccine # 1:    Vaccine Type: Fluvax Non-MCR    Site: left deltoid    Mfr: GlaxoSmithKline    Dose: 0.5 ml    Route: IM    Given by: Hilda Blades Ditzler RN    Exp. Date: 04/25/2011    Lot #: [id number]    VIS given: 05/20/10 version given September 30, 2010.  Flu Vaccine Consent Questions:    Do you have a history of severe allergic reactions to this vaccine? no    Any prior history of allergic reactions to egg and/or gelatin? no    Do you have a sensitivity to the preservative Thimersol? no    Do you have a past history of Guillan-Barre Syndrome? no    Do you currently have an acute febrile illness? no    Have you ever had a severe reaction to latex? no    Vaccine information given and explained to  patient? yes    Are you currently pregnant? no   Immunizations Administered:  Influenza Vaccine # 1:    Vaccine Type: Fluvax Non-MCR    Site: left deltoid    Mfr: GlaxoSmithKline    Dose: 0.5 ml    Route: IM    Given by: Hilda Blades Ditzler RN    Exp. Date: 04/25/2011    Lot #: [id number]    VIS given: 05/20/10 version given September 30, 2010. Process Orders Check Orders Results:     [facility]: [id number] not required for this  insurance Order queued for requisitioning for [facility]: October 05, 2010 11:42 PM Tests Sent for requisitioning (October 05, 2010 11:42 PM):     09/30/2010: [facility] -- T-Basic Metabolic Panel 0000000 (signed)     Prevention & Chronic Care Immunizations   Influenza vaccine: Fluvax Non-MCR  (09/30/2010)   Influenza vaccine deferral: Deferred  (09/26/2009)    Tetanus booster: 12/30/2009: Tdap    Pneumococcal vaccine: Not documented  Colorectal Screening   Hemoccult: Not documented   Hemoccult action/deferral: Not indicated  (06/10/2010)    Colonoscopy: DONE  (02/03/2010)   Colonoscopy action/deferral: GI referral  (12/30/2009)   Colonoscopy due: 01/2015  Other Screening   Pap smear: Not documented   Pap smear action/deferral: Deferred  (09/26/2009)    Mammogram: No specific mammographic evidence of malignancy.  No significant changes compared to previous study.  Assessment: BIRADS 1.   (04/04/2010)   Mammogram action/deferral: Refused  (09/26/2009)   Mammogram due: 03/2011   Smoking status: never  (09/30/2010)  Diabetes Mellitus   HgbA1C: 6.6  (09/30/2010)   Hemoglobin A1C due: 06/23/2007    Eye exam: No diabetic retinopathy.  OU   (03/19/2009)   Diabetic eye exam action/deferral: Deferred  (06/10/2010)   Eye exam due: 03/19/2010    Foot exam: yes  (11/14/2007)   Foot exam action/deferral: Not indicated   High risk foot: No  (09/26/2009)   Foot care education: 12/27/2007  (03/28/2007)   Foot exam due: 05/11/2008    Urine microalbumin/creatinine ratio: 100.1  (01/21/2007)   Urine microalbumin action/deferral: Not indicated   Urine microalbumin/cr due: 01/21/2008    Diabetes flowsheet reviewed?: Yes   Progress toward A1C goal: At goal  Lipids   Total Cholesterol: 193  (12/30/2009)   Lipid panel action/deferral: Lipid Panel ordered   LDL: 125  (12/30/2009)   LDL Direct: Not documented   HDL: 46  (12/30/2009)   Triglycerides: 112   (12/30/2009)   Lipid panel due: 12/10/2007    SGOT (AST): 12  (12/30/2009)   SGPT (ALT): 14  (12/30/2009)   Alkaline phosphatase: 60  (12/30/2009)   Total bilirubin: 0.5  (12/30/2009)    Lipid flowsheet reviewed?: Yes   Progress toward LDL goal: Deteriorated  Hypertension   Last Blood Pressure: 138 / 83  (09/30/2010)   Serum creatinine: 1.17  (12/30/2009)   Serum potassium 4.1  (12/30/2009)    Hypertension flowsheet reviewed?: Yes   Progress toward BP goal: Improved  Self-Management Support :   Personal Goals (by the next clinic visit) :     Personal A1C goal: 7  (06/10/2010)     Personal blood pressure goal: 130/80  (06/10/2010)     Personal LDL goal: 100  (06/10/2010)    Patient will work on the following items until the next clinic visit to reach self-care goals:     Medications and monitoring: take my medicines every day, check my blood sugar, check my blood  pressure, bring all of my medications to every visit, examine my feet every day  (09/30/2010)     Eating: drink diet soda or water instead of juice or soda, eat more vegetables, use fresh or frozen vegetables, eat foods that are low in salt, eat baked foods instead of fried foods, eat fruit for snacks and desserts, limit or avoid alcohol  (09/30/2010)     Activity: take a 30 minute walk every day, park at the far end of the parking lot  (09/30/2010)    Diabetes self-management support: Copy of home glucose meter record, Written self-care plan, Education handout, Resources for patients handout  (09/30/2010)   Diabetes care plan printed   Diabetes education handout printed    Hypertension self-management support: Written self-care plan, Education handout, Resources for patients handout  (09/30/2010)   Hypertension self-care plan printed.   Hypertension education handout printed    Lipid self-management support: Written self-care plan, Education handout, Resources for patients handout  (09/30/2010)   Lipid self-care plan  printed.   Lipid education handout printed      Resource handout printed.  Laboratory Results   Blood Tests   Date/Time Received: September 30, 2010 3:14 PM  Date/Time Reported: Lenoria Farrier  September 30, 2010 3:14 PM   HGBA1C: 6.6%   (Normal Range: Non-Diabetic - 3-6%   Control Diabetic - 6-8%) CBG Random:: 148mg /dL       Last LDL:                                                 125 (12/30/2009 7:00:00 PM)        Diabetic Foot Exam Last Podiatry Exam Date: 05/12/2007 Comments: Dr Donnella Bi wants to do own foot exam. Hilda Blades Ditzler RN

## 2010-11-28 ENCOUNTER — Ambulatory Visit: Payer: Self-pay | Admitting: Cardiovascular Disease

## 2010-11-28 ENCOUNTER — Ambulatory Visit: Admit: 2010-11-28 | Payer: Self-pay | Admitting: Cardiovascular Disease

## 2010-11-28 NOTE — Letter (Signed)
Summary: [facility] Office Note  [facility] Office Note   Imported By: Sallee Provencal 03/25/2010 11:00:43  _____________________________________________________________________  External Attachment:    Type:   Image     Comment:   External Document

## 2010-11-28 NOTE — Letter (Signed)
Summary: [facility]   Imported By: Bonner Puna 01/02/2010 13:43:59  _____________________________________________________________________  External Attachment:    Type:   Image     Comment:   External Document

## 2010-12-03 NOTE — Miscellaneous (Signed)
Summary: Orders Update  Clinical Lists Changes  Orders: Added new Test order of Renal Artery Duplex (Renal Artery Duplex) - Signed 

## 2011-01-06 ENCOUNTER — Encounter: Payer: Self-pay | Admitting: Internal Medicine

## 2011-01-06 ENCOUNTER — Ambulatory Visit (INDEPENDENT_AMBULATORY_CARE_PROVIDER_SITE_OTHER): Payer: 59 | Admitting: Internal Medicine

## 2011-01-06 VITALS — BP 182/103 | HR 90 | Temp 97.4°F | Ht 63.0 in | Wt 218.7 lb

## 2011-01-06 DIAGNOSIS — E119 Type 2 diabetes mellitus without complications: Secondary | ICD-10-CM

## 2011-01-06 DIAGNOSIS — E785 Hyperlipidemia, unspecified: Secondary | ICD-10-CM

## 2011-01-06 DIAGNOSIS — I10 Essential (primary) hypertension: Secondary | ICD-10-CM

## 2011-01-06 LAB — GLUCOSE, CAPILLARY: Glucose-Capillary: 80 mg/dL (ref 70–99)

## 2011-01-06 LAB — LIPID PANEL
Cholesterol: 211 mg/dL — ABNORMAL HIGH (ref 0–200)
Total CHOL/HDL Ratio: 4 Ratio

## 2011-01-06 LAB — COMPREHENSIVE METABOLIC PANEL
Albumin: 4.3 g/dL (ref 3.5–5.2)
Alkaline Phosphatase: 66 U/L (ref 39–117)
BUN: 14 mg/dL (ref 6–23)
CO2: 28 mEq/L (ref 19–32)
Calcium: 9.7 mg/dL (ref 8.4–10.5)
Glucose, Bld: 120 mg/dL — ABNORMAL HIGH (ref 70–99)
Potassium: 3.9 mEq/L (ref 3.5–5.3)

## 2011-01-06 MED ORDER — GABAPENTIN 100 MG PO CAPS: 100.0000 mg | ORAL_CAPSULE | Freq: Three times a day (TID) | ORAL | Status: DC

## 2011-01-06 NOTE — Assessment & Plan Note (Signed)
Good sugar level controls. Abdominal patient monitor to assess CBG levels at home. Will also check A1c. Patient will continue the same medication metformin thousand milligram tablet twice daily. I have examined patient's feet today and the physical exam results are within normal limits.

## 2011-01-06 NOTE — Progress Notes (Signed)
  Subjective:    Patient ID: Kristy [name], female    DOB: [birth year], 55 y.o.   MRN: [id number]  HPI  patient is 55 year old female with past medical history outlined below who presents to clinic for regular followup on her blood pressure. She reports compliance with medications for hypertension as well as compliance with recommended diet. She checks her blood pressure at home and reports that numbers are ranging 120-140/80-90. In addition she reports worsening pain in the lower extremities which she thinks is related to her neuropathy. She is taking tramadol but that offers minimal relief. She feels constant numbness and tingling in her feet as well as burning sensation. She is working full time and would like to have a medication that would control the pain better so that she can continue working. She denies recent sicknesses or hospitalizations, no episodes of chest pain or shortness of breath, no abdominal or urinary concerns. She is [facility]. Appointment next week.   Review of Systems Per HPI    Objective:   Physical Exam    Constitutional: Vital signs reviewed.  Patient is a well-developed and well-nourished in no acute distress and cooperative with exam. Alert and oriented x3. Mouth: no erythema or exudates, MMM Eyes: PERRL, EOMI, conjunctivae normal, No scleral icterus.  Neck: Supple, Trachea midline normal ROM, No JVD, mass, thyromegaly, or carotid bruit present.  Cardiovascular: RRR, S1 normal, S2 normal, no MRG, pulses symmetric and intact bilaterally Pulmonary/Chest: CTAB, no wheezes, rales, or rhonchi Abdominal: Soft. Non-tender, non-distended, bowel sounds are normal, no masses, organomegaly, or guarding present.  Ext; bilateral pitting edema +2, extending up to knees Psychiatric: Normal mood and affect. speech and behavior is normal. Judgment and thought content normal. Cognition and memory are normal.       Assessment &  Plan:

## 2011-01-06 NOTE — Assessment & Plan Note (Signed)
Patient reports compliance with medication as well as recommended diet. Will check fasting lipid panel today and readjust medication regimen if indicated. We will also check liver function tests.

## 2011-01-06 NOTE — Assessment & Plan Note (Signed)
Blood pressure today 183/103. I have reviewed patient's medication list as well as the bottles with refills. It appears that patient is compliant with the current medications. Today she has not placed clonidine patch. I have discussed this case with Dr. Philbert Riser. We have agreed to emphasize medication compliance. Advised to place clonidine patch today when patient arrives home. Also we have advised patient to continue monitoring blood pressure at home and to write the numbers down and to bring it to the clinic so that we can review it and readjust medication regimen is indicated. She is at maximum dose for most of her blood pressure medications except hydralazine. There is a room in terms of increasing hydralazine dosing. We will make no changes to medication regimen today and will recheck blood pressure before patient leaves the clinic. This could be related to renal artery stenosis. Patient follows at [facility] regularly. We will check electrolyte panel today to reassess kidney function.

## 2011-01-06 NOTE — Patient Instructions (Signed)
Continue to monitor your blood pressure at home. Also please write down the numbers and bring it back to next clinic appointment so that we can review the blood pressure control. Please schedule follow up appointment in 3 months.

## 2011-01-09 LAB — GLUCOSE, CAPILLARY: Glucose-Capillary: 149 mg/dL — ABNORMAL HIGH (ref 70–99)

## 2011-01-13 ENCOUNTER — Ambulatory Visit (INDEPENDENT_AMBULATORY_CARE_PROVIDER_SITE_OTHER): Payer: 59 | Admitting: Internal Medicine

## 2011-01-13 ENCOUNTER — Other Ambulatory Visit: Payer: Self-pay | Admitting: Internal Medicine

## 2011-01-13 ENCOUNTER — Encounter: Payer: Self-pay | Admitting: Internal Medicine

## 2011-01-13 ENCOUNTER — Telehealth: Payer: Self-pay | Admitting: *Deleted

## 2011-01-13 VITALS — BP 161/92 | HR 84 | Temp 97.3°F | Ht 63.0 in | Wt 220.0 lb

## 2011-01-13 DIAGNOSIS — G909 Disorder of the autonomic nervous system, unspecified: Secondary | ICD-10-CM

## 2011-01-13 DIAGNOSIS — E1143 Type 2 diabetes mellitus with diabetic autonomic (poly)neuropathy: Secondary | ICD-10-CM

## 2011-01-13 DIAGNOSIS — I10 Essential (primary) hypertension: Secondary | ICD-10-CM

## 2011-01-13 DIAGNOSIS — E1149 Type 2 diabetes mellitus with other diabetic neurological complication: Secondary | ICD-10-CM

## 2011-01-13 DIAGNOSIS — E119 Type 2 diabetes mellitus without complications: Secondary | ICD-10-CM

## 2011-01-13 MED ORDER — TRAMADOL HCL (ER BIPHASIC) 100 MG PO CP24: 1.0000 | ORAL_CAPSULE | Freq: Three times a day (TID) | ORAL | Status: DC | PRN

## 2011-01-13 NOTE — Progress Notes (Signed)
Subjective:    Patient ID: Kristy [name], female    DOB: 1956/08/18, 55 y.o.   MRN: [id number]  HPI Patient is a 55 year old female with a past medical history of type 2 diabetes mellitus, hypertension, hyperlipidemia , renal artery stenosis , who presents to the clinic today for the following:  1) Hypertension followup - the patient was seen in the clinic on 313 2012 at which time her blood pressure was found to be 183 / 101. The patient remained asymptomatic without blood pressure. She had not placed her clonidine patch on morning nerve presentation. Therefore the patient was stressed medication compliance and hypertensive medications were not escalated during her visit. The patient does check blood pressure regularly at home, with average BP readings of 120-140s mmHg. Currently taking Lasix 40mg  BID, Norvasc 10mg , Benazepril 40mg , Coreg 25mg  BID, Clonidine 0.3mg  patch, Hydralazine 25mg , which she states she is taking regularly. Denies headaches, dizziness, lightheadedness, chest pain, shortness of breath. Patient to follow-up with Dr.Goldsborough this week Re: HTN.  2) DMII - Patient checking blood sugars 3times daily, preprandial before breakfast, lunch, dinner. Reports preprandial to breakfast blood sugars of 90-120 mg/dL. Currently taking Metformin 1000mg  BID and Lantus 15 units qHS. Zero hypoglycemic episodes since last visit. Denies polyuria, polydipsia, abdominal pain, nausea, vomiting.  3) Peripheral neuropathy - patient describes chronic peripheral neuropathy x 2-3 years, for which she is was recently started on Gabapentin 100mg  TID. Describes sharp, tingling pain in BL LE extending to feet, with burning of feet. 9/10 in intensity at its worst, for which pt takes tylenol and previously tramadol, which worked well for patient for short period of time. Gabapentin has minimally helped with the pain, but just started last week, and patient reports significant sleepiness with the medication.  Affecting daily living, where pt off of work today because could not endure the pain. Alleviated by staying off of feet, aggravated by activity.     Review of Systems Per HPI.  Current Outpatient Medications Medication Sig  . amLODipine (NORVASC) 10 MG tablet Take 10 mg by mouth daily.    Marland Kitchen aspirin 81 MG EC tablet Take 81 mg by mouth daily.    . benazepril (LOTENSIN) 40 MG tablet Take 40 mg by mouth daily.    . carvedilol (COREG) 25 MG tablet Take 25 mg by mouth 2 (two) times daily.    . cloNIDine (CATAPRES) 0.3 MG/24HR 1 patch once a week.    . furosemide (LASIX) 40 MG tablet Take 40 mg by mouth 2 (two) times daily.    Marland Kitchen gabapentin (NEURONTIN) 100 MG capsule Take 1 capsule (100 mg total) by mouth 3 (three) times daily.  . hydrALAZINE (APRESOLINE) 25 MG tablet Take 25 mg by mouth 3 (three) times daily.    . insulin glargine (LANTUS) 100 UNIT/ML injection Inject 15 Units into the skin at bedtime.    . metFORMIN (GLUCOPHAGE) 1000 MG tablet Take 1,000 mg by mouth 2 (two) times daily.    . pravastatin (PRAVACHOL) 40 MG tablet Take 80 mg by mouth daily.    . traMADol (ULTRAM) 50 MG tablet    Allergies Review of patient's allergies indicates no known allergies.  Past Medical History  Diagnosis Date  . Hypertension   . Hyperlipidemia   . Diabetes mellitus   . Renal artery stenosis 2007     status post selective bilateral renal angiography, balloon angiopathy of the left renal artery, first diagnosed in 2007 based on MRA of the renal arteries which suggested  fibrovascular dysplasia on the lab, done by Dr. Albertine Patricia  . Sleep apnea 2007     status post polysomnogram 2007 , suggested use of CPAP  . Ventricular hypertrophy 2006     a 2-D echo in 2006, ejection fraction 55%, mild tricuspid regurgitation noted as well, 2-D echo January 19, [id number] showed diastolic dysfunction with LVEF normal of 65%    Past Surgical History  Procedure Date  . Renal artery stent 2007     patient is status post  renal artery stent placement May 2007 and that was done secondary to renal artery stenosis, fibromuscular dysplasia       Objective:   Physical Exam General: Vital signs reviewed and noted. Well-developed,well-nourished,in no acute distress; alert,appropriate and cooperative throughout examination. Head: normocephalic, atraumatic. Neck: No deformities, masses, or tenderness noted. Lungs: Normal respiratory effort. Clear to auscultation BL without crackles or wheezes.  Heart: RRR. S1 and S2 normal without gallop, murmur, or rubs.  Abdomen: BS normoactive. Soft, Nondistended, non-tender.  No masses or organomegaly. Extremities: No pretibial edema.         Assessment & Plan:  Case and plan of care discussed with Dr. Milta Deiters.

## 2011-01-13 NOTE — Telephone Encounter (Signed)
Pt called  With c/o leg pain.  She was seen here week for the same pain.  She was given meds but the new meds make her very sleepy and she is not able to work. Wants to be seen today  Appointment given

## 2011-01-13 NOTE — Assessment & Plan Note (Signed)
Pain uncontrolled at this time, gabapentin causing too much sedation to allow for daily activity. Tramadol worked well in past. - Will dc gabapentin - Will restart Tramadol at increased dosage.

## 2011-01-13 NOTE — Patient Instructions (Signed)
1) Please follow-up at the clinic in 1 month, at which time we will reevaluate your pain, diabetes, blood pressure. 2) You have been started on new medication(s), if you develop throat closing, tongue swelling, rash, please stop the medication and call the clinic at [phone] and go to the ER. 3) Please bring all of your medications in a bag to your next visit. 4) If you are diabetic, please bring your meter to your next visit. 5) If symptoms worsen, or new symptoms arise, please call the clinic or go to the ER. 6) You have been started on a new medication that can cause drowsiness, do not drive or operate heavy machinery . Do not take this medication with alcohol.

## 2011-01-13 NOTE — Assessment & Plan Note (Addendum)
Patient has a history of renal artery stenosis, status post stenting. Blood pressures have remained uncontrolled despite 6 drug regimen. Patient states that she is compliant with all medications as prescribed.  Patient states that her home blood pressure cuff indicates average blood reading blood pressure readings between 120-140 mmHg.  Therefore it is unclear if elevated blood pressures currently are reflective of acute pain versus mediation noncompliance versus inadequate medical management versus potential secondary causes of elevated blood pressures. Cr wnl currently. -  Will continue current regimen. -  Will contact the pharmacy to assess if refills are being appropriately completed. Although patient does state she is taking her medications regularly, will confirm. -  Will contact Dr. Moshe Cipro of [facility] to determine if hypertension is being managed through their service. -  Will attempt to better control pain, reassessed at next visit if blood pressure is still poorly controlled. -  Patient asked to bring her blood pressure cuff to next clinic visit , and to compare readings discussed by clinic versus patient's blood pressure cuffs -  Recommended low sodium diet.

## 2011-01-13 NOTE — Assessment & Plan Note (Signed)
DMII well controlled with HgA1c last week being 6.8. - Continue current regimen.  - Check urine microalb/cre - Request records from [facility] for last eye exam (patient states completed within 2011).

## 2011-01-14 LAB — GLUCOSE, CAPILLARY
Glucose-Capillary: 127 mg/dL — ABNORMAL HIGH (ref 70–99)
Glucose-Capillary: 142 mg/dL — ABNORMAL HIGH (ref 70–99)

## 2011-01-15 ENCOUNTER — Other Ambulatory Visit: Payer: Self-pay | Admitting: Cardiovascular Disease

## 2011-01-15 DIAGNOSIS — I701 Atherosclerosis of renal artery: Secondary | ICD-10-CM

## 2011-01-16 ENCOUNTER — Encounter (INDEPENDENT_AMBULATORY_CARE_PROVIDER_SITE_OTHER): Payer: 59 | Admitting: Cardiology

## 2011-01-16 DIAGNOSIS — I701 Atherosclerosis of renal artery: Secondary | ICD-10-CM

## 2011-01-18 LAB — GLUCOSE, CAPILLARY: Glucose-Capillary: 120 mg/dL — ABNORMAL HIGH (ref 70–99)

## 2011-01-20 ENCOUNTER — Encounter: Payer: Self-pay | Admitting: Internal Medicine

## 2011-01-26 ENCOUNTER — Other Ambulatory Visit: Payer: Self-pay | Admitting: *Deleted

## 2011-01-28 MED ORDER — INSULIN GLARGINE 100 UNIT/ML ~~LOC~~ SOLN: 15.0000 [IU] | Freq: Every day | SUBCUTANEOUS | Status: DC

## 2011-01-29 ENCOUNTER — Encounter: Payer: Self-pay | Admitting: Cardiovascular Disease

## 2011-02-03 LAB — BASIC METABOLIC PANEL
BUN: 9 mg/dL (ref 6–23)
CO2: 19 mEq/L (ref 19–32)
CO2: 23 mEq/L (ref 19–32)
CO2: 24 mEq/L (ref 19–32)
Calcium: 8.9 mg/dL (ref 8.4–10.5)
Calcium: 9.3 mg/dL (ref 8.4–10.5)
Chloride: 112 mEq/L (ref 96–112)
Creatinine, Ser: 0.76 mg/dL (ref 0.4–1.2)
Creatinine, Ser: 0.9 mg/dL (ref 0.4–1.2)
GFR calc non Af Amer: 50 mL/min — ABNORMAL LOW (ref >60)
GFR calc non Af Amer: >60 mL/min (ref >60)
Glucose, Bld: 102 mg/dL — ABNORMAL HIGH (ref 70–99)
Glucose, Bld: 118 mg/dL — ABNORMAL HIGH (ref 70–99)
Glucose, Bld: 123 mg/dL — ABNORMAL HIGH (ref 70–99)
Glucose, Bld: 130 mg/dL — ABNORMAL HIGH (ref 70–99)
Potassium: 4.5 mEq/L (ref 3.5–5.1)
Potassium: 4.8 mEq/L (ref 3.5–5.1)
Sodium: 139 mEq/L (ref 135–145)
Sodium: 142 mEq/L (ref 135–145)
Sodium: 142 mEq/L (ref 135–145)
Sodium: 143 mEq/L (ref 135–145)

## 2011-02-03 LAB — DIFFERENTIAL
Basophils Absolute: 0 10*3/uL (ref 0.0–0.1)
Basophils Absolute: 0 10*3/uL (ref 0.0–0.1)
Lymphocytes Relative: 12 % (ref 12–46)
Lymphs Abs: 1.3 10*3/uL (ref 0.7–4.0)
Monocytes Absolute: 1 10*3/uL (ref 0.1–1.0)
Monocytes Relative: 6 % (ref 3–12)
Neutro Abs: 9 10*3/uL — ABNORMAL HIGH (ref 1.7–7.7)
Neutrophils Relative %: 80 % — ABNORMAL HIGH (ref 43–77)
Neutrophils Relative %: 86 % — ABNORMAL HIGH (ref 43–77)

## 2011-02-03 LAB — CBC
HCT: 39.6 % (ref 36.0–46.0)
HCT: 44.1 % (ref 36.0–46.0)
Hemoglobin: 11.8 g/dL — ABNORMAL LOW (ref 12.0–15.0)
Hemoglobin: 13.2 g/dL (ref 12.0–15.0)
Hemoglobin: 13.4 g/dL (ref 12.0–15.0)
Hemoglobin: 14.8 g/dL (ref 12.0–15.0)
MCHC: 33.5 g/dL (ref 30.0–36.0)
MCHC: 33.8 g/dL (ref 30.0–36.0)
MCV: 80.5 fL (ref 78.0–100.0)
MCV: 81 fL (ref 78.0–100.0)
Platelets: 202 10*3/uL (ref 150–400)
Platelets: 235 10*3/uL (ref 150–400)
RDW: 16.6 % — ABNORMAL HIGH (ref 11.5–15.5)
RDW: 16.6 % — ABNORMAL HIGH (ref 11.5–15.5)
RDW: 17.1 % — ABNORMAL HIGH (ref 11.5–15.5)
RDW: 17.4 % — ABNORMAL HIGH (ref 11.5–15.5)

## 2011-02-03 LAB — LEGIONELLA ANTIGEN, URINE

## 2011-02-03 LAB — URINALYSIS, ROUTINE W REFLEX MICROSCOPIC
Hgb urine dipstick: NEGATIVE
Ketones, ur: 40 mg/dL — AB
Protein, ur: 100 mg/dL — AB
Urobilinogen, UA: 1 mg/dL (ref 0.0–1.0)

## 2011-02-03 LAB — HEPATIC FUNCTION PANEL
ALT: 52 U/L — ABNORMAL HIGH (ref 0–35)
ALT: 86 U/L — ABNORMAL HIGH (ref 0–35)
AST: 46 U/L — ABNORMAL HIGH (ref 0–37)
AST: 88 U/L — ABNORMAL HIGH (ref 0–37)
Albumin: 2.3 g/dL — ABNORMAL LOW (ref 3.5–5.2)
Alkaline Phosphatase: 93 U/L (ref 39–117)
Bilirubin, Direct: 0.3 mg/dL (ref 0.0–0.3)
Total Bilirubin: 0.7 mg/dL (ref 0.3–1.2)
Total Protein: 6.9 g/dL (ref 6.0–8.3)

## 2011-02-03 LAB — COMPREHENSIVE METABOLIC PANEL
Alkaline Phosphatase: 97 U/L (ref 39–117)
BUN: 20 mg/dL (ref 6–23)
Creatinine, Ser: 1.44 mg/dL — ABNORMAL HIGH (ref 0.4–1.2)
Glucose, Bld: 121 mg/dL — ABNORMAL HIGH (ref 70–99)
Potassium: 5 mEq/L (ref 3.5–5.1)
Total Protein: 8.3 g/dL (ref 6.0–8.3)

## 2011-02-03 LAB — HEPATITIS PANEL, ACUTE: HCV Ab: NEGATIVE

## 2011-02-03 LAB — RAPID URINE DRUG SCREEN, HOSP PERFORMED
Amphetamines: NOT DETECTED
Benzodiazepines: NOT DETECTED
Cocaine: NOT DETECTED
Tetrahydrocannabinol: NOT DETECTED

## 2011-02-03 LAB — URINE CULTURE: Colony Count: >=100000

## 2011-02-03 LAB — GLUCOSE, CAPILLARY
Glucose-Capillary: 120 mg/dL — ABNORMAL HIGH (ref 70–99)
Glucose-Capillary: 105 mg/dL — ABNORMAL HIGH (ref 70–99)
Glucose-Capillary: 111 mg/dL — ABNORMAL HIGH (ref 70–99)
Glucose-Capillary: 120 mg/dL — ABNORMAL HIGH (ref 70–99)
Glucose-Capillary: 122 mg/dL — ABNORMAL HIGH (ref 70–99)

## 2011-02-03 LAB — D-DIMER, QUANTITATIVE: D-Dimer, Quant: 2.67 ug/mL-FEU — ABNORMAL HIGH (ref 0.00–0.48)

## 2011-02-03 LAB — URINE MICROSCOPIC-ADD ON

## 2011-02-03 LAB — STREP PNEUMONIAE URINARY ANTIGEN: Strep Pneumo Urinary Antigen: NEGATIVE

## 2011-02-03 LAB — CULTURE, BLOOD (ROUTINE X 2): Culture: NO GROWTH

## 2011-02-04 LAB — GLUCOSE, CAPILLARY: Glucose-Capillary: 109 mg/dL — ABNORMAL HIGH (ref 70–99)

## 2011-02-05 ENCOUNTER — Ambulatory Visit (INDEPENDENT_AMBULATORY_CARE_PROVIDER_SITE_OTHER): Payer: 59 | Admitting: Cardiovascular Disease

## 2011-02-05 ENCOUNTER — Encounter: Payer: Self-pay | Admitting: Cardiovascular Disease

## 2011-02-05 VITALS — BP 186/114 | HR 97 | Resp 18 | Ht 63.0 in | Wt 217.0 lb

## 2011-02-05 DIAGNOSIS — I119 Hypertensive heart disease without heart failure: Secondary | ICD-10-CM

## 2011-02-05 DIAGNOSIS — I701 Atherosclerosis of renal artery: Secondary | ICD-10-CM

## 2011-02-05 DIAGNOSIS — I10 Essential (primary) hypertension: Secondary | ICD-10-CM

## 2011-02-05 LAB — GLUCOSE, CAPILLARY: Glucose-Capillary: 142 mg/dL — ABNORMAL HIGH (ref 70–99)

## 2011-02-05 MED ORDER — HYDRALAZINE HCL 50 MG PO TABS: 50.0000 mg | ORAL_TABLET | Freq: Three times a day (TID) | ORAL | Status: DC

## 2011-02-05 NOTE — Patient Instructions (Signed)
Your physician has recommended you make the following change in your medication: INCREASE Hydralazine to 50mg  take one tablet by mouth three times a day  Your physician wants you to follow-up in: 1 YEAR.  You will receive a reminder letter in the mail two months in advance. If you don't receive a letter, please call our office to schedule the follow-up appointment.  Renal duplex no longer needed for follow-up unless ordered by Dr Burt Knack.

## 2011-02-10 ENCOUNTER — Other Ambulatory Visit: Payer: Self-pay | Admitting: Internal Medicine

## 2011-02-15 ENCOUNTER — Encounter: Payer: Self-pay | Admitting: Cardiovascular Disease

## 2011-02-15 NOTE — Assessment & Plan Note (Signed)
Stable with normal duplex scan noted earlier this year. No followup imaging required at this point.

## 2011-02-15 NOTE — Progress Notes (Signed)
HPI:  This is a 55 year-old woman presenting for follow-up evaluation. She has malignant hypertension, diabetes, and renal FMD with history of left renal angioplasty. The patient was last seen in January and was noted to have markedly elevated BP. At that time amlodipine was increased from 5 to 10 mg and hydralazine was added. She has noted improved BP since then, reporting home readings in the 120-140's/80-90's. She denies chest pain, dyspnea, palpitations, lightheadedness, or syncope. She does complain of leg edema.  Renal duplex from March 2012 showed normal renal arteries bilaterally with no evidence of stenoses.  Outpatient Encounter Prescriptions as of 02/05/2011  Medication Sig Dispense Refill  . amLODipine (NORVASC) 10 MG tablet Take 10 mg by mouth daily.        Marland Kitchen aspirin 81 MG EC tablet Take 81 mg by mouth daily.        . benazepril (LOTENSIN) 40 MG tablet Take 40 mg by mouth daily.        . carvedilol (COREG) 25 MG tablet Take 25 mg by mouth 2 (two) times daily.        . cloNIDine (CATAPRES) 0.3 MG/24HR 1 patch once a week.        . furosemide (LASIX) 40 MG tablet Take 40 mg by mouth 2 (two) times daily.        Marland Kitchen gabapentin (NEURONTIN) 100 MG capsule Take 100 mg by mouth 3 (three) times daily.        . hydrALAZINE (APRESOLINE) 50 MG tablet Take 1 tablet (50 mg total) by mouth 3 (three) times daily.  270 tablet  3  . insulin glargine (LANTUS) 100 UNIT/ML injection Inject 15 Units into the skin at bedtime.  10 mL  3  . metFORMIN (GLUCOPHAGE) 1000 MG tablet Take 1,000 mg by mouth 2 (two) times daily.        . pravastatin (PRAVACHOL) 40 MG tablet Take 80 mg by mouth daily.        Marland Kitchen DISCONTD: hydrALAZINE (APRESOLINE) 25 MG tablet Take 25 mg by mouth 3 (three) times daily.        Marland Kitchen DISCONTD: TraMADol HCl 100 MG CP24 Take 1 tablet by mouth 3 (three) times daily as needed.  90 capsule  0    No Known Allergies  Past Medical History  Diagnosis Date  . Hypertension   . Hyperlipidemia   .  Diabetes mellitus   . 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 Albertine Patricia  . Sleep apnea 2007     status post polysomnogram 2007 , suggested use of CPAP  . Ventricular hypertrophy 2006     a 2-D echo in 2006, ejection fraction 55%, mild tricuspid regurgitation noted as well, 2-D echo January 19, [id number] showed diastolic dysfunction with LVEF normal of 65%    ROS: Negative except as per HPI  BP 186/114  Pulse 97  Resp 18  Ht 5\' 3"  (1.6 m)  Wt 217 lb (98.431 kg)  BMI 38.44 kg/m2  LMP 01/05/2009  PHYSICAL EXAM: Pt is alert and oriented, overweight woman, in NAD HEENT: normal Neck: JVP - normal, carotids 2+= without bruits Lungs: CTA bilaterally CV: RRR without murmur or gallop Abd: soft, NT, Positive BS, no hepatomegaly Ext: no C/C/E, distal pulses intact and equal Skin: warm/dry no rash  ASSESSMENT  AND PLAN:

## 2011-02-15 NOTE — Assessment & Plan Note (Signed)
The patient clearly has a component of white-coat HTN, but BP is markedly elevated again today. Home BP's are improved but many readings are still above goal in this patient with diabetes. Recommend increase hydralazine to 50 mg three times daily and otherwise continue current program. She follows regularly with her PCP and with Dr Moshe Cipro, so I will plan on seeing her back in one year. She will continue to monitor BP at home and will call if BP's are above 160/95.

## 2011-03-04 ENCOUNTER — Telehealth: Payer: Self-pay | Admitting: Cardiovascular Disease

## 2011-03-04 NOTE — Telephone Encounter (Signed)
Faxed LOV & Renal Duplex to [facility] [place].

## 2011-03-05 ENCOUNTER — Other Ambulatory Visit: Payer: Self-pay | Admitting: *Deleted

## 2011-03-08 MED ORDER — AMLODIPINE BESYLATE 10 MG PO TABS: 10.0000 mg | ORAL_TABLET | Freq: Every day | ORAL | Status: DC

## 2011-03-08 MED ORDER — FUROSEMIDE 40 MG PO TABS: 40.0000 mg | ORAL_TABLET | Freq: Two times a day (BID) | ORAL | Status: DC

## 2011-03-08 MED ORDER — METFORMIN HCL 1000 MG PO TABS: 1000.0000 mg | ORAL_TABLET | Freq: Two times a day (BID) | ORAL | Status: DC

## 2011-03-08 MED ORDER — BENAZEPRIL HCL 40 MG PO TABS: 40.0000 mg | ORAL_TABLET | Freq: Every day | ORAL | Status: DC

## 2011-03-08 MED ORDER — PRAVASTATIN SODIUM 40 MG PO TABS: 80.0000 mg | ORAL_TABLET | Freq: Every day | ORAL | Status: DC

## 2011-03-08 MED ORDER — CARVEDILOL 25 MG PO TABS: 25.0000 mg | ORAL_TABLET | Freq: Two times a day (BID) | ORAL | Status: DC

## 2011-03-09 ENCOUNTER — Other Ambulatory Visit: Payer: Self-pay | Admitting: Orthopaedic Surgery

## 2011-03-09 ENCOUNTER — Encounter (HOSPITAL_COMMUNITY): Payer: 59

## 2011-03-09 ENCOUNTER — Other Ambulatory Visit (HOSPITAL_COMMUNITY): Payer: Self-pay | Admitting: Orthopaedic Surgery

## 2011-03-09 ENCOUNTER — Ambulatory Visit (HOSPITAL_COMMUNITY)
Admission: RE | Admit: 2011-03-09 | Discharge: 2011-03-09 | Disposition: A | Discharge status: Home / Self Care | Payer: 59 | Source: Ambulatory Visit | Attending: Orthopaedic Surgery | Admitting: Orthopaedic Surgery

## 2011-03-09 DIAGNOSIS — Z01812 Encounter for preprocedural laboratory examination: Secondary | ICD-10-CM | POA: Insufficient documentation

## 2011-03-09 DIAGNOSIS — Z01811 Encounter for preprocedural respiratory examination: Secondary | ICD-10-CM

## 2011-03-09 DIAGNOSIS — Z01810 Encounter for preprocedural cardiovascular examination: Secondary | ICD-10-CM | POA: Insufficient documentation

## 2011-03-09 DIAGNOSIS — I10 Essential (primary) hypertension: Secondary | ICD-10-CM | POA: Insufficient documentation

## 2011-03-09 DIAGNOSIS — Z01818 Encounter for other preprocedural examination: Secondary | ICD-10-CM | POA: Insufficient documentation

## 2011-03-09 DIAGNOSIS — M1710 Unilateral primary osteoarthritis, unspecified knee: Secondary | ICD-10-CM | POA: Insufficient documentation

## 2011-03-09 LAB — PROTIME-INR
INR: 1 (ref 0.00–1.49)
Prothrombin Time: 13.4 seconds (ref 11.6–15.2)

## 2011-03-09 LAB — URINALYSIS, ROUTINE W REFLEX MICROSCOPIC
Bilirubin Urine: NEGATIVE
Glucose, UA: NEGATIVE mg/dL
Hgb urine dipstick: NEGATIVE
Ketones, ur: NEGATIVE mg/dL
Leukocytes, UA: NEGATIVE
Nitrite: NEGATIVE
Protein, ur: 30 mg/dL — AB
Specific Gravity, Urine: 1.018 (ref 1.005–1.030)
Urobilinogen, UA: 1 mg/dL (ref 0.0–1.0)
pH: 7 (ref 5.0–8.0)

## 2011-03-09 LAB — CBC
HCT: 45.3 % (ref 36.0–46.0)
Hemoglobin: 14.5 g/dL (ref 12.0–15.0)
MCH: 25.8 pg — ABNORMAL LOW (ref 26.0–34.0)
MCHC: 32 g/dL (ref 30.0–36.0)
MCV: 80.6 fL (ref 78.0–100.0)
Platelets: 221 10*3/uL (ref 150–400)
RBC: 5.62 MIL/uL — ABNORMAL HIGH (ref 3.87–5.11)
RDW: 15.6 % — ABNORMAL HIGH (ref 11.5–15.5)
WBC: 8.2 10*3/uL (ref 4.0–10.5)

## 2011-03-09 LAB — URINE MICROSCOPIC-ADD ON

## 2011-03-09 LAB — BASIC METABOLIC PANEL
Calcium: 9.5 mg/dL (ref 8.4–10.5)
GFR calc Af Amer: >60 mL/min (ref >60)
GFR calc non Af Amer: 50 mL/min — ABNORMAL LOW (ref >60)
Sodium: 140 mEq/L (ref 135–145)

## 2011-03-09 LAB — SURGICAL PCR SCREEN: MRSA, PCR: NEGATIVE

## 2011-03-10 NOTE — Progress Notes (Signed)
[facility]                        PERIPHERAL VASCULAR OFFICE NOTE   NAME:[name], [name]                        MRN:          [id number]  DATE:01/02/2008                            DOB:          12-28-1955    Kristy [name] presents for follow-up at the [facility] Peripheral Vascular  Office on January 02, 2008.  Kristy [name] is a 55 year old woman with renal  artery fibromuscular dysplasia, status post angioplasty of the left  renal artery by Dr. Albertine Patricia in 2007.  She continues to have malignant  hypertension in the setting of relatively well preserved renal function.   Kristy [name] brings in home blood pressure readings that shows systolic  blood pressures in the 140s and [id number] and diastolic pressures generally  in the 90s.  She denies chest pain, dyspnea, orthopnea or PND.  She has  had recurrent edema.  She has no other complaints today.   MEDICATIONS:  1. Benazepril 40 mg daily.  2. Lantus 25 units as directed.  3. Metformin 500 mg twice daily.  4. Clonidine 0.3 mg twice daily.  5. Lasix 40 mg daily.  6. Norvasc 10 mg daily.  7. Coreg 12.5 mg twice daily.  8. Pravastatin 20 mg daily.   ALLERGIES:  NKDA.   PHYSICAL EXAMINATION:  She is alert and oriented in no acute distress.  Blood pressure is  210/130 in both arms.  Heart rate was 100, weight is  228, respiratory rate 16.  HEENT:  Normal.  NECK:  Normal carotid upstrokes without bruits.  Jugular venous pressure  normal.  LUNGS:  Clear bilaterally.  HEART:  Regular rate and rhythm without murmurs.  ABDOMEN:  Soft, obese, nontender, no organomegaly.  EXTREMITIES:  There is 1+ pretibial edema bilaterally.  Peripheral  pulses are intact and equal.   ASSESSMENT:  1. Malignant hypertension.  2. Renal artery fibromuscular dysplasia.  3. Type 2 diabetes.  4. Hypercholesterolemia.   DISCUSSION:  Kristy [name] continues to have problems with hypertension  and blood pressure control.  She is on multidrug  therapy and there is a  big discrepancy between her home readings and office readings.  She may  have a significant component of white coat hypertension but clearly her  blood pressure is not well controlled at present.  I had initially  planned to change her from Coreg to labetalol but cost is a major issue  and so all I have elected to increase her Coreg from 12.5 to 25 mg twice  daily.  I have also started her back on hydrochlorothiazide 25 mg daily.  Will have to follow her potassium and creatinine closely as she is  already on Lasix 40 mg daily.  Will follow up with a metabolic panel and  office visit in 2 weeks to see if we are making any progress with her  blood pressure control.  She really did not have much of a response with  balloon angioplasty of her fibromuscular dysplasia and her last renal  ultrasound from January showed normal renal artery velocities.     [name]. Burt Knack, MD  Electronically Signed  MDC/MedQ  DD: 01/02/2008  DT: 01/03/2008  Job #: [id number]   cc:   Jacolyn Reedy, M.D.  Louis Meckel, M.D.

## 2011-03-10 NOTE — Progress Notes (Signed)
[facility]                        PERIPHERAL VASCULAR OFFICE NOTE   NAME:Kristy [name], Kristy [name]                        MRN:          [id number]  DATE:08/21/2008                            DOB:          06-06-1956    [name] returns for followup at the [facility] Peripheral Vascular  Office, August 21, 2008.  She is a 55 year old African American woman  with malignant hypertension.  She has a history of renal artery  fibromuscular dysplasia and underwent balloon angioplasty in the left  renal artery in 2007.  She did not have a good clinical response even  though her renal artery velocities have normalized on followup  ultrasounds.  In past follow-up, she has had systolic blood pressure  readings greater than [id number] and diastolic readings well above 100.  More  recently, she has achieved good blood pressure control on her current  regimen.  She has a blood pressure checked regularly at her local  pharmacy and reports systolic readings on average less than [id number] and  diastolic readings less than 90.  She denies chest pain, dyspnea,  orthopnea, or PND.  She has leg edema, most notable at the end of the  day.  She is under an extreme amount of stress at present due to some  personal problems at home.   CURRENT MEDICATIONS:  1. Coreg 25 mg twice daily.  2. Aspirin 81 mg daily.  3. Amlodipine 5 mg daily.  4. Loratadine 10 mg daily.  5. Pravastatin 40 mg daily.  6. Metformin 1 g twice daily.   ALLERGIES:  NKDA.   PHYSICAL EXAMINATION:  GENERAL:  She is alert and oriented in no acute  distress.  VITAL SIGNS:  Weight is 219 pounds, blood pressure 140/100 in the right  arm, 150/100 in the left arm, heart rate 90, respiratory rate 16.  HEENT:  Normal.  NECK:  Normal carotid upstrokes, no bruits.  JVP normal.  LUNGS:  Clear bilaterally.  HEART:  Regular rate and rhythm.  No murmurs or gallops.  ABDOMEN:  Soft, obese, nontender, no organomegaly.  EXTREMITIES:  No  clubbing, cyanosis, or edema.  Peripheral pulses are  intact and equal.   EKG shows normal sinus rhythm with biatrial enlargement otherwise within  normal limits.   ASSESSMENT:  [name] is currently stable from a cardiovascular  standpoint with regard to her hypertension and renal fibromuscular  dysplasia.  Her blood pressure control was reasonably good, especially  based on her home blood pressure readings.  Her most recent renal  ultrasound was performed in January and demonstrated essentially normal  velocities in both renal arteries.  Her kidney size is normal  bilaterally at 10.7 and 10.6 cm on the right and left respectively.   PLAN:  Followup renal ultrasound in January for her 1 year followup  study.  No adjustments were made to her medical regimen today.  She is  due to follow up with Dr. Moshe Cipro in April 2009, and I would like  to see her back in July.  If she remains stable, I will plan on seeing  her once  a year thereafter.     [name]. Burt Knack, MD  Electronically Signed    MDC/MedQ  DD: 08/21/2008  DT: 08/22/2008  Job #: [id number]   cc:   Louis Meckel, M.D.

## 2011-03-10 NOTE — Discharge Summary (Signed)
Kristy [name], Kristy [name]                 ACCOUNT NO.:  [id number]   MEDICAL RECORD NO.:  [id number]          PATIENT TYPE:  INP   LOCATION:  2014                         FACILITY:  [facility]   PHYSICIAN:  Evette Doffing, M.D.  DATE OF BIRTH:  03/31/1956   DATE OF ADMISSION:  03/01/2009  DATE OF DISCHARGE:  03/04/2009                               DISCHARGE SUMMARY   DISCHARGE DIAGNOSES:  1. Community-acquired pneumonia.  2. Type 2 diabetes.  3. Hyperlipidemia.  4. Hypertension.  5. Chronic left leg pain.  6. History of renal artery stenosis status post left balloon      angioplasty.   DISCHARGE MEDICATIONS:  1. Avelox 400 mg p.o. daily for 7 days.  2. Darvocet 50/325 mg 1-2 tablets p.o. q.6 h. p.r.n. for pain.  3. Benazepril 20 mg p.o. daily.  4. Lantus 15 units subcutaneously nightly.  5. Metformin 1000 mg p.o. b.i.d.  6. Catapres 0.3 mg p.o. b.i.d.  7. Norvasc 10 mg p.o. daily.  8. Pravachol 40 mg 2 tablets p.o. daily.  9. Coreg 25 mg p.o. b.i.d.  10.Lasix 40 mg p.o. b.i.d.  11.Aspirin 81 mg p.o. daily.  12.Claritin 10 mg p.o. daily.  13.Clonazepam 0.5 mg 2 tablets p.o. nightly.   DISPOSITION AND FOLLOWUP:  The patient will be discharged on Mar 04, 2009.  She will follow up in the [facility] with Dr.  Patrice Paradise on Mar 14, 2009, at 2:50 p.m.  At which time, he can assess  whether she has taken all of her Avelox to treat the community-acquired  pneumonia, in addition the patient continues to complain of chronic  right leg pain.  This has been worked up with no abnormalities found,  and she might benefit from physical therapy as an outpatient.   PROCEDURES PERFORMED:  1. Chest x-ray on Mar 01, 2009, shows infiltrate/pneumonia in the right      upper lobe, followed.  2. Chest x-ray in May 2010 shows more consolidation with a right upper      lobe consistent with worsening of pneumonia.   CONSULTATIONS:  None.   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  The patient states her right lower extremity has been causing her pain  for greater than 1 year but states the pain has increased over the last  1 week.  The patient describes the pain is constant aching.  The patient  has been diagnosed with superficial peroneal nerve inflammation  secondary to her diabetes and the Darvocet she has been taking ran out  this morning.  She states the Darvocet provides her with moderate pain  relief.  The patient denies any trauma to the area, any fever, or any  skin changes.  The patient also denies any sick complaints, chest pain,  shortness of breath, fever, cough, nausea, vomiting, diarrhea, or sick  contacts.   PHYSICAL EXAMINATION:  VITAL SIGNS:  Temperature 102.0, blood pressure  127/83, pulse 118, respirations 23, oxygen saturation 86% on room air.  GENERAL:  The patient is in no acute distress and speaking in full  sentences.  HEENT:  Eyes, pupils equal, round, and reactive to light.  Extraocular  movements intact.  ENT, no tonsillar enlargement, no exudates.  NECK:  No thyromegaly.  RESPIRATORY:  Bilateral air entry with mild crackles at the bases  bilaterally and some coarse breath sounds at the right upper lobe.  CARDIOVASCULAR:  Tachycardic.  Regular rhythm.  No murmurs, rubs, or  gallops.  GASTROINTESTINAL:  Obese.  Abdomen soft, nontender, positive bowel  sounds.  EXTREMITIES:  No clubbing, cyanosis, or edema.  MUSCULOSKELETAL:  There is no tenderness to palpation anywhere on the  right lower extremity and no joint abnormalities.  NEUROLOGIC:  Nonfocal.  PSYCH:  Appropriate.   LABORATORY DATA:  Sodium 139, potassium 5.0, chloride 104, bicarb 23,  BUN 20, creatinine 1.4, glucose 121.  White blood cell  count 16.6,  hemoglobin 14.8, platelets 202.  D-dimer 2.6.  Urinalysis, white blood  cells 3-6, rbc's 0-2, small leukocytes, and many bacteria.   HOSPITAL COURSE:  1. Right upper lobe pneumonia:  The patient was admitted to [facility] for evaluation of her new-onset pneumonia. Despite      not having any sick-type complaints, the patient was febrile and      hypoxic.  In the emergency department, she was  given a dose      azithromycin and Rocephin and put on a nasal cannula in order to      treat her new onset pneumonia.  Initial chest x-ray did show a      right upper lobe process.  We chose to continue the azithromycin      and Rocephin throughout the night and the patient did have      persisting infection that was somewhat worse on chest x-ray after      the patient received IV fluids throughout the first night of      hospitalization.  Her white blood cell count stayed consistently      elevated for the first day of hospitalization.  She was febrile      throughout her first night of hospitalization.  We decided to keep      her for a second day in the hospital in order to monitor her white      blood cell count and fever curve.  Throughout her second/third day      of hospitalization, she defervesced and her white blood cell count      trended down to normal limit to 8.9.  At the time of discharge, her      vital signs remained stable and she was clinically improved and      saturating on room air.  The patient will be discharged home on a 7-      day course of Avelox in order to treat her community-acquired      pneumonia.  We have encouraged her to fill this prescription and      take it at home as directed.  There is a mentioned that both her      urine Legionella antigen was negative, as well as her strep pneumo      urinary antigen was negative as well.  2. Acute renal failure:  The patient's baseline creatinine is anywhere  from 1.0 to 1.2, but it  was elevated at the time of admission      secondary to decreased p.o. intake over the last week.  The patient      was dehydrated, and she was rehydrated with normal saline.  Her      creatinine improved from 1.44 on the first day of hospitalization      to 1.15 on the second day of hospitalization and continued to      improve to a value of 0.76 at the time of discharge.  She will need      a basic metabolic panel at the time of hospital followup in order      to assess her creatinine status.  3. Hypertension:  The patient was dehydrated at the time of admission      and so the primary team decided to withhold her hypertensive meds.      The patient has very uncontrolled blood pressure as an outpatient,      is on 5 agents in order to control her high blood pressure.  With      repletion of IV fluids, the patient's blood pressure stabilized and      actually became elevated on the day prior to discharge.  She will      be restarted on all of her agents upon returning home.  Her blood      pressure medication could be further adjusted at the time of      hospital followup.  4. Chronic right lower extremity pain:  The patient's primary reason      for presenting to the hospital and that she had run out of her      Darvocet, which she uses to treat her chronic right lower extremity      pain.  This has been a pain that has gone on for greater than a      year and the emergency medicine physicians order D-dimer, which was      elevated, but the primary team chose to not pursue this as a lower      extremity clot and that the patient's pain is often bilateral and      is chronic in nature in the right lower extremity.  On exam, her      lower extremity was without any deformities.  There was no cord      palpated.  She had good distal pulses.  There was no erythema.      There was no evidence of trauma and there was no evidence of joint      abnormalities, and she was able to ambulate on the  right lower      extremity.  She was repeatedly asking to be put on disability      throughout her initial evaluation in the emergency department.  Her      pain was controlled with Darvocet her hospitalization.  Her      Darvocet will be refilled at the time of discharge.  Ms. Samp      would benefit from physical therapy as an outpatient but in that      she recently had a normal bilateral lower extremity Dopplers in      January of this year, and the primary team chose not to further      pursue this chronic right lower extremity pain during this acute      hospitalization for pneumonia.  5. Type 2 diabetes:  The patient was managed on sliding scale of her      insulin throughout her hospitalization, will be restarted on her      home meds at the time of discharge.   DISCHARGE VITAL SIGNS:  Temperature 98.4, blood pressure 158/106, pulse  107, respirations 18, saturating 95% on room air.   DISCHARGE LABORATORY DATA:  Sodium 143, potassium 3.8, chloride 112,  bicarb 23, BUN 6, creatinine 0.76, glucose 118.  White blood cell count  8.9, hemoglobin 11.8, platelets 230.   The patient was discharged home in stable and improved condition.      Kathlynn Grate, MD  Electronically Signed      Evette Doffing, M.D.  Electronically Signed    ZF/MEDQ  D:  03/04/2009  T:  03/05/2009  Job:  [id number]   cc:   Jacolyn Reedy, M.D.

## 2011-03-10 NOTE — Progress Notes (Signed)
[place]                        PERIPHERAL VASCULAR OFFICE NOTE   NAME:Kristy [name], Kristy [name]                        MRN:          [id number]  DATE:09/29/2007                            DOB:          [birth year]    The patient was seen in follow-up at the [facility] Per[facility]  Office on September 29, 2007.  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  She underwent  angioplasty of the left renal artery by Ethelle Lyon, M.D. back in  2007.  She has continued to have difficulty with blood pressure control  since that time.  She has had preserved renal function with the most  creatinine in June of this year at 1.2.   The patient has been doing relatively well from a symptomatic  standpoint, but she has had a great deal of trouble with her blood  pressure.  She has not had chest pain, dyspnea, orthopnea, PND, or  edema.   CURRENT MEDICATIONS:  1. Benazepril 40 mg daily.  2. Clonidine 0.2 mg twice daily.  3. Lasix 40 mg twice daily.  4. Lantus insulin 25 units daily.  5. Lipitor 10 mg daily.  6. Norvasc 5 mg daily.  7. Metoprolol tartrate 50 mg b.i.d.  8. Metformin 500 mg b.i.d.   ALLERGIES:  No known drug allergies.   PHYSICAL EXAMINATION:  VITAL SIGNS:  Weight 230, blood pressure 198/111  in the right arm and 212/118 in the left arm.  Heart rate initially 103,  respiratory rate 16.  HEENT:  Normal.  NECK:  Normal carotid upstrokes without bruits.  Jugular venous pressure  normal.  LUNGS:  Clear to auscultation bilaterally.  HEART:  Regular rate and rhythm with a soft S4 gallop.  ABDOMEN:  Soft and nontender.  No organomegaly and no abdominal bruits.  EXTREMITIES:  No cyanosis, clubbing, or edema.  Peripheral pulses are 2+  and equal.   ASSESSMENT:  1. Severe hypertension.  2. Renal artery fibromuscular dysplasia.  3. Hypercholesterolemia.  4. Obesity.  5. Sleep apnea.   The patient's last  renal ultrasound was in January of this year.  She  had normal bilateral kidney size with right renal artery demonstrating  mildly elevated velocities and a normal left renal artery after PTA for  treatment of FMD.  There were no highly elevated velocities across  either renal artery.  The patient was given an additional dose of  clonidine here in the office at 0.2 mg.  She did not have any signs of  hypertensive emergency and she denied headache, chest pain, or visual  changes.  We kept her until her blood pressure was significantly  improved.   I have increased her amlodipine dose to 10 mg daily.  She will continue  to monitor her blood pressure closely and follow up next week for a  blood pressure check.  I am also going to repeat a renal ultrasound to  evaluate for recurrent fibromuscular dysplasia.     [name]. Burt Knack, MD  Electronically Signed    MDC/MedQ  DD: 10/06/2007  DT: 10/06/2007  Job #: [id number]   cc:   Baxter Kail, M.D.  Louis Meckel, M.D.

## 2011-03-10 NOTE — Progress Notes (Signed)
[place]                        PERIPHERAL VASCULAR OFFICE NOTE   NAME:Kristy [name], Kristy [name]                        MRN:          [id number]  DATE:01/16/2008                            DOB:          February 08, 1956    Kristy [name] returned for followup at the [facility] office on January 16, 2008.  Kristy [name] is a 55 year old woman with renal artery fibromuscular  dysplasia and malignant hypertension.  She underwent angioplasty of the  left renal artery in 2007, but has not had a very good response with  regard to her hypertension even though her renal velocities have been  normal on followup ultrasounds.   When I saw her earlier this month, her blood pressure was markedly  elevated at 210/130 in both arms.  At that point, her Coreg was  increased to 25 mg twice daily up from 12.5 mg twice daily and she was  started back on hydrochlorothiazide which she has taken in the past.   From a symptomatic standpoint, she reports no changes.  Overall, she  feels better and her blood pressure control has been improved.  She has  had no dyspnea, orthopnea or PND.   CURRENT MEDICATIONS:  1. Benazepril 40 mg daily.  2. Lantus insulin as directed.  3. Metformin 500 mg b.i.d.  4. Clonidine 0.3 mg b.i.d.  5. Lasix 40 mg daily.  6. Amlodipine 10 mg daily.  7. Pravastatin 20 mg daily.  8. Coreg 25 mg twice daily.  9. Hydrochlorothiazide 25 mg daily.   ALLERGIES:  No known drug allergies.   PHYSICAL EXAMINATION:  VITAL SIGNS:  Blood pressure is 140/90 in the  right arm and 142/84 the left arm, heart rate 84, respiratory rate 16.  The remainder of the exam was deferred today as she was just seen 2  weeks previously.   ASSESSMENT:  Kristy [name] has improved blood pressure after upward  titration of her antihypertensive medications.  No changes were made  today to her medical regimen.  We are going to check a metabolic panel  to make sure that her potassium and creatinine are  stable in the setting  of multiple medications with two different diuretics.  She is going to  see Dr. Moshe Cipro in April.  I would like to see her back in clinic  in 6 months and I would be happy to  see her sooner if any cardiac problems arise.  She will be due for a  renal ultrasound again in January 2010.     [name]. Burt Knack, MD  Electronically Signed    MDC/MedQ  DD: 01/16/2008  DT: 01/17/2008  Job #: [id number]   cc:   Louis Meckel, M.D.

## 2011-03-13 NOTE — Procedures (Signed)
Kristy [name], Kristy [name]                 ACCOUNT NO.:  [id number]   MEDICAL RECORD NO.:  [id number]          PATIENT TYPE:  OUT   LOCATION:  SLEEP CENTER                 FACILITY:  [facility]   PHYSICIAN:  Clinton D. Annamaria Boots, M.D. DATE OF BIRTH:  Aug 19, 1956   DATE OF STUDY:                              NOCTURNAL POLYSOMNOGRAM   INDICATION FOR STUDY:  Hypersomnia with sleep apnea.   EPWORTH SLEEPINESS SCORE:  1/2.  BMI of 39.4, weight to 223 pounds.   HOME MEDICATION:  Atenolol, amlodipine, HCTZ, lisinopril and Niaspan.   SLEEP ARCHITECTURE:  Total sleep time 400 minutes with sleep efficiency 92%.  Stage I was 7%, stage II 89%, stages III and IV absent, REM 3% of total  sleep time.  Sleep latency 5 minutes, REM latency 75 minutes, awake after  sleep onset 31 minutes, arousal index 5.4.  No bedtime medication was  reported.   RESPIRATORY DATA:  CPAP titration protocol.  CPAP was titrated to 22, AHI 0  per hour.  This pressure was required for final control of snoring and  residual hypopneas.  An AHI of 0 was also achieved at least temporarily at  18 CWP with some residual snoring.  A large full-face comfort mask was used  with heated humidifier.   OXYGEN DATA:  Snoring was finally prevented at 22 CWP with oxygen saturation  on CPAP 94-96% on room air.   CARDIAC DATA:  Sinus rhythm with occasional PVC.   MOVEMENT/ PARASOMNIA:  Occasional limb jerk with little effect on sleep.   IMPRESSION/RECOMMENDATION:  1.  CPAP titration to 22 5 CWP, AHI 0 per hour.  A large full-face comfort      mask was used with heated humidifier.  This pressure would require use      of a BiPAP mask.  CPAP at 18 CWP may prove sufficient, also giving an      ANI of 0 per hour with some residual light snoring and occasional      breakthrough hypopnea noted.  2.  Baseline diagnostic nocturnal polysomnogram on November 17, 2005, had      recorded an AHI of 49.7 per hour.      Clinton D. Annamaria Boots, M.D.  Diplomate,  [employer]  Electronically Signed     CDY/MEDQ  D:  04/18/2006 11:35:43  T:  04/19/2006 06:02:31  Job:  [id number]

## 2011-03-13 NOTE — Discharge Summary (Signed)
NAMEEMUNAH, ARON                 ACCOUNT NO.:  [id number]   MEDICAL RECORD NO.:  [id number]          PATIENT TYPE:  INP   LOCATION:  [id number]                         FACILITY:  [facility]   PHYSICIAN:  Evette Doffing, M.D.  DATE OF BIRTH:  1955-11-08   DATE OF ADMISSION:  12/09/2006  DATE OF DISCHARGE:  12/15/2006                               DISCHARGE SUMMARY   DISCHARGE DIAGNOSES:  1. Diabetic ketoacidosis - resolved.  2. Hyperglycemic hyperosmolar syndrome - resolved.  3. New onset type-2 diabetes.  4. Hypertension.  5. Multiple periapical tooth abscesses, status post extractions.  6. Acute renal failure - resolved.  7. Heparin induced thrombocytopenia.  8. Tobacco abuse.  9. 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  10.Hyperlipidemia.   DISCHARGE MEDICATIONS:  1. Augmentin 875/125 mg one tab p.o. b.i.d. times 4 days.  2. Atenolol 100 mg p.o. daily.  3. Benazepril 20 mg p.o. daily.  4. Hydrochlorothiazide 25 mg p.o. daily.  5. Pravastatin 40 mg p.o. daily.  6. Zyban 150 mg p.o. b.i.d.  7. Lantus insulin 30 units subcutaneous q.h.s.  8. NovoLog insulin 6 units t.i.d. before meals.   DISPOSITION AND FOLLOW UP:  At the time of discharge, the patient was in  stable condition.  Her tooth pain had resolved and she had no evidence  of DKA or HHS.  Her blood sugars remained slightly elevated, but were  much improved from the time of admission.  She is scheduled for follow  up in the outpatient clinic with Merril Abbe, the diabetes educator on  Friday, December 17, 2006 at 10 a.m.  At that time, she will also have a  basic metabolic panel performed to monitor her electrolytes and renal  function.  Additionally, the patient is to see her primary care  physician, Dr. Patrice Paradise, in the outpatient clinic on December 27, 2006, at 9  a.m.  A basic metabolic panel will also be obtained at this time.  Further  titration of her insulin regimen and antihypertensives will  likely be necessary.  Finally, Ms. Tarver it to follow up with Dr.  Enrique Sack at the dental clinic at [facility] on February  26,2008 at 12:30 p.m.   PROCEDURES PERFORMED:  1. CT scan of the face: This was performed on December 09, 2006 and      revealed mild sinus disease in the ethmoid air cells.  A periapical      lucency involving the right maxilla raising the concern for dental      disease and possibly a periapical abscess was also noted.  2. Orthopantogram:  This was performed on December 10, 2006 and was      remarkable for extensive dental disease with multiple areas of      periapical lucency along the maxilla, particularly in the right      maxilla that corresponded to the CT findings noted above.  This was      concerning for abscess formation.  Additional areas of periapical  lucency along the left mandible were also consistent with an      abscess.  Finally, multiple areas of tooth erosion and caries were      seen.   CONSULTATIONS:  Dr. Teena Dunk, dental surgery.   BRIEF ADMITTING HISTORY AND PHYSICAL:  Ms. Arlt is a 55 year old woman  with a history of hypertension, hyperlipidemia, and renal artery  stenosis who presented to the emergency department complaining of  generalized malaise, accompanied by nausea and a poor appetite for 4 to  5 days prior to presentation.  She denied having any vomiting or  diarrhea.  Of note, she has experienced considerable polydipsia and  polyphagia for approximately 1 week.  This has also been accompanied by  weight loss of an unknown amount over the last several weeks.  She does  not have a history of diabetes mellitus, though her family history does  include this.  She also endorsed having chills, but no fever.  She was  seen by a dentist approximately 1 week prior to presentation, at which  time she was told that she had a dental infection that would  require  tooth extraction.  However, she was unable to afford this procedure and  did not receive any follow up or antibiotic therapy.   PHYSICAL EXAMINATION:  VITAL SIGNS:  Temperature 98, blood pressure  110/73, pulse 76, respirations 16, oxygen saturation 97% on room air.  GENERAL:  The patient is an overweight woman in no acute distress.  HEENT:  Pupils are equal, round, and reactive to light and  accommodation.  Extraocular eye movements are intact.  She has extremely  dry mucus membranes with poor dentition.  An area of redness is seen  over the left maxillary gum line.  No discharge is noted.  NECK:  This is supple without lymphadenopathy or thyromegaly.  RESPIRATORY:  Lungs are clear to auscultation bilaterally.  CARDIOVASCULAR:  The patient has a regular rate and rhythm with a 2/6  systolic ejection murmur, heard the loudest at the left lower sternal  border.  ABDOMEN:  Normoactive bowel sounds are present.  The abdomen is soft,  nontender, nondistended, and without hepatosplenomegaly.  EXTREMITIES:  No lower extremity edema is noted, though the patient has  moderate bilateral calf tenderness.  GU:  No CVA tenderness is present.  NEUROLOGICAL:  Cranial nerves II-XII are intact.  The patient has normal  sensation and strength in her upper and lower extremities bilaterally.   ADMISSION LABS:  White blood cell count 16.6, hemoglobin 17.8,  hematocrit 56.6, platelets 269,000.  MCV 86.9.  ANC 14.3.  D-dimer 2.12.  Sodium 131, potassium 5.7, chloride 96, bicarbonate 18, BUN 74,  creatinine 1.4, glucose 1292.  Total bilirubin 1.5.  Alkaline-  phosphatase 102.  AST 29, ALT 33.  Total protein 8.4.  Albumin 3.7,  calcium 10.6.  Serum acetones small.  Lactic acid 4.  Serum osmolality  346.  Urine drug screen negative.  Urinalysis notable specific gravity  of 1.013, pH 5.5, glucose greater than 1000, moderate blood, hyalin and granular casts, 11 to 20 white blood cells, 3 to 6 red blood  cells,  bacteria, and yeast.   HOSPITAL COURSE BY PROBLEM:  1. Diabetes with ketoacidosis and hyperglycemic hyperosmolar syndrome:      Though the patient does not carry a history of diabetes, her      markedly elevated glucose and evidence of both DKA and HHS suggest      that a new diagnosis of  diabetes mellitus.  The patient was      hydrated aggressively with normal saline and started on an insulin      drip.  Every 2 hour basic metabolic panels and hourly CBGs were      obtained to monitor her sugar and electrolytes.  Her anion gap was      initially elevated at 17, but closed rapidly with administration of      IV fluids and IV insulin.  Interestingly, her bicarbonate was not      low enough to meet the criteria for DKA.  However, it was felt that      her laboratory values and clinical presentation were consistent      with features of both DKA and HHS.  Once her blood sugar reached      250, her anion gap had closed, and her bicarbonate was greater than      20, she was transitioned to subcutaneous insulin.  This was      titrated upwards during the hospitalization, and it was decided      that she would be discharged on a regimen of Lantus 30 units daily      and mealtime NovoLog of 6 units three times a day.  It is likely      that she will need further adjustment of the medications, and she      is scheduled to see Merril Abbe in the outpatient clinic for      diabetes education and suggestions for a more comprehensive insulin      regimen.  Of note, a hemoglobin A1C was ordered on admission but      could not be calculated because of an abnormal hemoglobin.  2. Hypertension:  The patient has a history of hypertension, though      her blood pressure was well-controlled at 110/73 at the time of      admission.  However, she was felt to be extremely dehydrated at      this time, and thus all of her antihypertensive medications were      held.  As noted above, she was  aggressively re-hydrated with      greater than 6 liters of IV fluid in the first 12 hours.  After      that time, the patient's blood pressure gradually increased and she      was started on metoprolol and lisinopril.  She tolerated these      medications well, and her blood pressure was mildly elevated at the      time of discharge.  She was not restarted on Norvasc, Lasix,      clonidine at this time.  However, HCTZ was added to her regimen      since her renal function was in the normal range.  Further      adjustments should be made when she returns to the outpatient      clinic for follow up.  As noted above, the patient will return in 2      days following discharge for lab work to ensure that her renal      function and electrolytes have remained stable.  3. Multiple tooth abscesses:  When the patient first presented to the      emergency room, she did not complain of any pain in her teeth.     However, she reported being diagnosed with dental disease      approximately 1 week prior to admission.  Examination revealed poor      dentition with areas of hyperemia along her gum line.  A CT scan      was ordered in the emergency room and revealed a possible      periapical abscess.  At that time, the patient was started on      Unasyn for empiric antibiotic coverage.  Blood cultures were      obtained, but revealed no growth.  On the day following admission,      an orthopantogram was performed and revealed a severe dental      disease with multiple abscesses and caries.  Dr. Enrique Sack was      consulted at that time and performed several tooth extractions on      December 14, 2006.  Though the patient had moderate bleeding during      this procedure, she tolerated it well.  At the time of discharge,      she was tolerating a puree'd diet without difficulty and did not      have any significant tooth pain.  She is to continue taking      Augmentin for 4 days following discharge from  the hospital.  She      will follow up with Dr. Enrique Sack in approximately 1 week.  She will      also need to be referred to a dentist for evaluation for dentures.  4. Acute renal failure:  On admission, the patient's creatinine was      near her baseline at 1.4.  However, this jumped significantly on      subsequent measurements.  It improved rapidly with rehydration, and      it was felt that her acute renal failure was due to prerenal      azotemia.  In fact, at the time of discharge, her creatinine was      0.9 which is less than her previous baseline.  She was encouraged      to stay adequately hydrated and will have routine monitoring of her      renal function as noted above.  5. Heparin induced thrombocytopenia:  The patient was found to have a      normal platelet count when she first arrived.  However, this      dropped quickly, likely due to hemodilution with IV fluids.      However, her platelets continued to trend down and a HIT-panel was      obtained.  The screening test was found to be positive at which      time, Lovenox was discontinued.  In the future, heparin products      should be avoided if at all possible.  At the time of discharge,      her platelets had returned to the normal range at 165,000.  6. Tobacco abuse:  The patient continues to smoke, though she was      previously taking Zyban to help her quit.  She was strongly      encouraged not to smoke, especially with her recent dental surgery.      She is to continue using Zyban as previously prescribed.  Follow up      should be performed when she returns to the outpatient clinic.   DISCHARGE LABS:  White blood cell count 11.2, hemoglobin 11.4, platelets  165,000.  Sodium 139, potassium 3.9, chloride 110.  Bicarbonate 27, BUN  6, creatinine 0.9, glucose 134, calcium 8.6.   DISCHARGE VITALS:  Temperature 98.2, pulse 94, blood pressure 140-170/90- 100, oxygen saturation 100% on room air.  Capillary blood  glucose 260.      Nelva Bush, M.D.  Electronically Signed      Evette Doffing, M.D.  Electronically Signed    CE/MEDQ  D:  12/15/2006  T:  12/16/2006  Job:  [id number]   cc:   Jacolyn Reedy, M.D.  Lenn Cal, D.D.S.  Merril Abbe

## 2011-03-13 NOTE — Discharge Summary (Signed)
NAMESKY, MINIHAN                 ACCOUNT NO.:  [id number]   MEDICAL RECORD NO.:  [id number]          PATIENT TYPE:  OIB   LOCATION:  6526                         FACILITY:  [facility]   PHYSICIAN:  Ethelle Lyon, M.D. LHCDATE OF BIRTH:  1956-03-08   DATE OF ADMISSION:  03/03/2006  DATE OF DISCHARGE:  03/04/2006                                 DISCHARGE SUMMARY   PROCEDURES:  1.  Abdominal aortogram.  2.  Bilateral renal angiogram.  3.  Inflatable balloon angioplasty to the left renal artery.   DISCHARGE DIAGNOSIS:  Renal artery stenosis, status post angioplasty to the left renal artery on  Mar 03, 2006.   SECONDARY DIAGNOSES:  1.  Hypertension.  2.  Dyslipidemia.  3.  History of renal insufficiency with a BUN/creatinine of 18/1.1 at      discharge.  4.  Obesity.  5.  Remote history of tobacco use.   HOSPITAL COURSE:  Ms. Cage is a 55 year old female with a long history of  hypertension that is poorly controlled on four medications.  She was  evaluated by Dr. Albertine Patricia for apparent left renal artery fibromuscular  dysplasia.  This was associated with impaired renal function.  Revascularization of the left renal artery was recommended and she was  admitted to the hospital for this on Mar 03, 2006.   Ms. Hibdon had inflatable balloon angioplasty to the left renal artery and  tolerated the procedure well.   On Mar 04, 2006 she had no complaints of chest pain or shortness of breath.  Her cath site was without ecchymosis, bruit or hematoma.  Her postprocedure  labs included a hemoglobin of 13.9, a BUN of 18, creatinine 1.1.  Her  glucose was 136.  She has no history of diabetes but is to follow up with  her primary care physician for this.  Ms. Parris was ambulating without chest  pain or shortness of breath and her cath site was stable.  Evaluation by Dr.  Albertine Patricia is pending.  She is tentatively considered stable for discharge on  Mar 04, 2006 with outpatient follow-up arranged.   DISCHARGE INSTRUCTIONS:  1.  Activity level is to be increased gradually.  2.  She is to call our office for any problems with cath site.  3.  She is to follow up with Dr. Albertine Patricia in June 19 at 3:00 p.m. with [facility], Dr. Veronia Beets, as needed.   DISCHARGE MEDICATIONS:  1.  Cardia XT 180 mg b.i.d.  2.  Lisinopril 40 mg b.i.d.  3.  HCTZ 25 mg a day.  4.  Atenolol 100 mg b.i.d.  5.  Aspirin 325 mg daily.  6.  Niaspan 5 mg daily.      Rosaria Ferries, P.A. [facility]      Ethelle Lyon, M.D. [facility]  Electronically Signed    RB/MEDQ  D:  03/04/2006  T:  03/05/2006  Job:  [id number]   cc:   Baxter Kail, M.D.

## 2011-03-13 NOTE — Consult Note (Signed)
NAMESHERISSE, SUDLER                 ACCOUNT NO.:  [id number]   MEDICAL RECORD NO.:  [id number]          PATIENT TYPE:  INP   LOCATION:  2626                         FACILITY:  [facility]   PHYSICIAN:  Lenn Cal, D.D.S.DATE OF BIRTH:  [birth year]   DATE OF CONSULTATION:  12/10/2006  DATE OF DISCHARGE:                                 CONSULTATION   Kristy [name] is a 55 year old female referred by Internal Medicine for a  dental consultation.  The patient was recently admitted with diabetic  ketoacidosis.  Dental consultation is requested to evaluate poor  dentition and to rule out dental infection which may affect the  patient's systemic health and glycemic control.   MEDICAL HISTORY:  1. Diabetes mellitus, current insulin therapy.  Now with diabetic      ketoacidosis and reason for this admission.  2. Hyperlipidemia.  3. Obstructive sleep apnea.  4. Renal artery stenosis.  5. Hypertension.   ALLERGIES:  None known.   MEDICATIONS:  1. Unasyn 3 g IV twice daily.  2. Lovenox 50 mg subcutaneously q.24 h.  3. NovoLog insulin per sliding scale.  4. Lantus insulin 10 units at bedtime.   SOCIAL HISTORY:  The patient is married.  Patient with a history of  smoking 1 pack per day for 35 years.  Patient with no use of alcohol by  report.  The patient does have a history of use of tetrahydrocannabinol.   FAMILY HISTORY:  Mother is alive, with a history of diabetes mellitus.  Father is alive. with a history of diabetes mellitus..   FUNCTIONAL ASSESSMENT:  The patient was independent for ADLs prior to  this admission.   REVIEW OF SYSTEMS:  This is reviewed from the chart and the health  history assessment form for this admission.   DENTAL HISTORY:   CHIEF COMPLAINT:  Dental consultation requested to evaluate poor  dentition.   HISTORY OF PRESENT ILLNESS:  Patient with a history of multiple  toothaches over the past several weeks.  The patient last saw a dentist  approximately 2  weeks ago.  The patient indicates that she saw Dr.  Serita Kyle and was unable to proceed with dental treatment due to  economic considerations.  The patient again denies acute toothache  symptoms at this time.  The patient denies presence of dentures at this  time.  The patient denies having regular dental care.   DENTAL EXAMINATION:  GENERAL:  The patient is well-developed female in  no acute distress.  VITAL SIGNS:  Blood pressure is 115/53, pulse 88, respirations are 18,  temperature is 97.9.  HEAD/NECK:  There is no palpable submandibular lymphadenopathy.  There  are no acute TMJ symptoms.  INTRAORAL:  Patient with poor dentition.  Patient with normal saliva.  Patient with no significant swellings or abscesses noted at this time.  DENTITION:  Patient with multiple missing teeth and multiple retained  root segments.  PERIODONTAL:  Patient with chronic advanced periodontal disease with  plaque and calculus accumulations, generalized gingival recession,  generalized tooth mobility, and moderate to severe bone loss.  DENTAL CARIES:  Patient with rampant dental caries.  PROSTHODONTIC:  Patient with no dentures.  ENDODONTIC:  Patient with a history of acute pulpitis symptoms, although  none now.  The patient does have multiple areas of periapical pathology  per dental x-ray.  [address] of there are no crown or bridge restorations noted.  OCCLUSION:  Patient with a poor occlusal scheme secondary to multiple  missing teeth, multiple retained root segments, and lack of replacement  of the missing teeth with clinically acceptable dental prosthesis.   RADIOGRAPHIC INTERPRETATION:  Panoramic x-ray was taken in the department of radiology.   There are multiple missing teeth.  There are multiple retained root  segments.  There are multiple areas of periapical pathology.  There are  rampant dental caries noted.  There is a poor occlusal scheme noted.   ASSESSMENT:  1. History of  acute pulpitis symptoms although none today.  2. Chronic apical periodontitis affecting multiple teeth.  3. Multiple missing teeth.  4. Multiple areas of periapical pathology.  5. Chronic periodontitis, with bone loss.  6. Plaque and calculus accumulations.  7. Generalized gingival recession.  8. Generalized tooth mobility.  9. History of oral neglect.  10.Supereruption and drifting of the unopposed teeth into the      edentulous areas.  11.Lack of replacement of the missing teeth with dental prosthesis.  12.Poor occlusal scheme.  13.Current Lovenox therapy with risk of bleeding with invasive dental      procedures.   PLAN//RECOMMENDATIONS:  1. I discussed risks, benefits, and complications of various      treatments for the patient in relationship to her medical and      dental conditions.  We discussed various treatment options to      include total and subtotal extractions with alveoloplasty,      preprosthetic surgery as indicated, periodontal therapy, dental      restorations, crown or bridge therapy, implant therapy, and      replacing the missing teeth as indicated.  The patient currently      wishes to proceed with multiple extractions with alveoloplasty and      preprosthetic surgery as indicated, along with gross debridement of      teeth that are deemed restorable.  The patient will then follow up      with a dentist of her choice for fabrication of an upper complete      denture as well as a lower complete or lower cast partial denture      as indicated after adequate healing.  This operating room procedure      with general anesthesia has tentatively been scheduled for Tuesday      December 14, 2006 at 7:30 in the morning.   1. Discussion of findings with Dr. Saunders Revel and Dr. Izora Gala Phifer is      indicated to coordinate future dental care as previously described.      Lenn Cal, D.D.S.  Electronically Signed    RFK/MEDQ  D:  12/10/2006  T:  12/10/2006   Job:  [id number]   cc:   Nelva Bush, M.D.  Evette Doffing, M.D.

## 2011-03-13 NOTE — Op Note (Signed)
NAMEZEMIRAH, STANICH                 ACCOUNT NO.:  [id number]   MEDICAL RECORD NO.:  [id number]          PATIENT TYPE:  INP   LOCATION:  [id number]                         FACILITY:  [facility]   PHYSICIAN:  Lenn Cal, D.D.S.DATE OF BIRTH:  [birth year]   DATE OF PROCEDURE:  12/14/2006  DATE OF DISCHARGE:                               OPERATIVE REPORT   SURGEON:  Lenn Cal, D.D.S.   PREOPERATIVE DIAGNOSES:  1. Diabetes mellitus.  2. History of diabetic ketoacidosis.  3. Thrombocytopenia.  4. Chronic periodontitis.  5. Accretions.  6. Bilateral hyperplastic maxillary tuberosities.  7. Multiple retained root segments.  8. Chronic apical periodontitis.   POSTOPERATIVE DIAGNOSES:  1. Diabetes mellitus.  2. History of diabetic ketoacidosis.  3. Thrombocytopenia.  4. Chronic periodontitis.  5. Accretions.  6. Bilateral hyperplastic maxillary tuberosities.  7. Multiple retained root segments.  8. Chronic apical periodontitis.   PROCEDURES.:  1. Extraction of tooth numbers 3, 4, 5, 6, 9, 10, 11, 12, 13, 14, 15,      17, 18, 19, 20, 23, 24, 25 and 26.  2. Four quadrants of alveoloplasty.  3. Bilateral maxillary tuberosity reductions.  4. Gross debridement of the remaining dentition.   SURGEON:  Lenn Cal, D.D.S.   ASSISTANT:  Oneida Arenas (Art therapist).   ANESTHESIA:  General anesthesia via nasoendotracheal tube.   FLUIDS:  Were 1100 mL lactated Ringer solution.   ESTIMATED BLOOD LOSS:  150 mL.   MEDICATIONS:  1. Ancef 1 gram IV prior to invasive dental procedures.  2. Local anesthesia with total utilization of six Carpules each      containing 36 mg of Xylocaine with 0.018 mg of epinephrine as well      as two Carpules each containing 9 mg of bupivacaine with 0.009 mg      of epinephrine.   SPECIMENS:  There were 19 teeth which were discarded.   CULTURES:  Were none.   DRAINS:  Were none.   COMPLICATIONS:  Were none.   INDICATIONS:  The patient  was recently diagnosed with diabetic  ketoacidosis and was the reason for this admission.  Dental consultation  was requested to evaluate the patient's poor dentition and to rule out  dental infection which may affect the patient's systemic health and  glycemic control.  The patient was examined and treatment plan for  multiple extractions with alveoloplasty and preprosthetic surgery as  indicated along with gross debridement of remaining dentition.  This  treatment plan was formulated again to decrease the risk for dental  infection.   OPERATIVE FINDINGS:  The patient was examined operating room #3.  The  teeth were identified for extraction.  The patient was noted to be  affected by chronic periodontitis, accretions, hyperplastic maxillary  tuberosities, chronic apical periodontitis, multiple retained root  segments.  The aforementioned necessitated removal of tooth numbers 3,  4, 5, 6, 9, 10, 11, 12, 13, 14, 15, 17, 18, 19, 20, 23, 24, 25, 26 with  alveoloplasty and bilateral maxillary tuberosity reductions.  The  patient also had gross debridement of remaining dentition.  DESCRIPTION OF PROCEDURE:  The patient was brought to the main operating  room #3.  The patient was then placed in supine position on operating  room table.  General anesthesia was induced with a nasoendotracheal  tube.  The patient was then prepped and draped in usual manner for  dental medicine procedure.  Throat pack was placed at this time.  The  oral cavity was thoroughly examined with findings as noted above.  A  time-out was performed and the patient was verified and the procedure  was confirmed.  The patient was then ready for the dental medicine  procedure as follows:   Local anesthesia was administered sequentially with total utilization of  six Carpules each containing 36 mg of Xylocaine with 0.018 mg of  epinephrine as well as two Carpules each containing 9 mg of bupivacaine  with 0.009 mg of  epinephrine.   The maxillary quadrants first approached.  Anesthesia was delivered on  the buccal and palatal aspects with infiltration technique.  A 15 blade  incision was made from the maxillary right tuberosity and extended to  the maxillary left tuberosity. Surgical flap was then carefully  reflected.  Appropriate amounts of buccal and interseptal bone was  removed at this time with a surgical handpiece and bur and copious  amounts sterile saline.  Tooth #3 was then subluxated and removed with a  53R forceps without complications.  Root segments numbers 4, 5 were then  removed with a 150 forceps without complications.  Further bone was  removed around tooth #6 and this was removed with a 150 forceps without  complications.  Tooth numbers 9 and 10 were then approached and further  bone was removed with the surgical handpiece and bur and copious amounts  sterile saline.  These teeth were then removed with a 150 forceps  without complications.  At this point in time alveoloplasty was  performed to the maxillary right quadrant using a rongeurs and bone  file.  The tissues were approximated and trimmed appropriately.  Maxillary right tuberosity reduction was achieved utilizing a 15 blade  and multiple soft tissue removal of the redundant aspects.  Tissues were  approximated and trimmed appropriately.  The surgical site was then  irrigated with copious amounts sterile saline.  A piece of Gelfoam was  placed in each extraction socket appropriately.  The surgical site was  then closed utilizing 3-0 chromic gut suture in a continuous interrupted  suture technique from the maxillary right tuberosity and extended to the  mesial of #8 utilizing 3-0 chromic gut suture in a continuous  interrupted suture technique x1.  Three separate interrupted sutures  were then further utilized to close the surgical site.   At this point in time the remaining maxillary left teeth were approached.  A surgical  handpiece and bur and copious amounts sterile  saline were utilized to remove further bone around tooth numbers 14, 15,  11, 12, and 13.  These teeth were then subluxated with a series of  straight elevators.  Tooth numbers 11, 12 and 13 were then removed with  a 150 forceps without complications.  Tooth #14 and 15 were then removed  with a 53L forceps without complications.  At this point time maxillary  left tuberosity reduction was achieved utilizing the 15 blade and soft  tissue pickups.  The alveoplasty was then further performed utilizing  rongeurs and bone file.  The tissues were approximated and trimmed  appropriately.  The surgical site was then  irrigated with copious  amounts sterile saline.  A piece of Gelfoam was placed in each  extraction socket appropriately.  The surgical site was then closed  maxillary left tuberosity and extended the mesial of #9 utilizing 3-0  chromic gut suture in a continuous interrupted suture technique x1.  Two  additional interrupted sutures were placed to further close the surgical  site.   At this point time the mandibular teeth were approached.  The patient  was given bilateral inferior alveolar nerve blocks utilizing the  bupivacaine with epinephrine.  Further infiltration was achieved  utilizing the Xylocaine with epinephrine.  At this point time, 15 blade  incision was made from the distal #17 and extended to the mesial of #20.  Surgical flap was then carefully reflected.  Appropriate amounts of  buccal and interseptal bone was removed around tooth numbers 17, 18, 19  and 20. The roots associated tooth numbers 17, 18 and 20 were then  removed with a 151 forceps without complications.  Tooth #19 was then  removed with a 23 forceps without complications.  Alveoplasty was then  performed utilizing rongeurs and bone file.  The surgical site was then  irrigated with copious amounts sterile saline.  Tissues were  approximated and trimmed  appropriately.  A piece of Surgicel was placed  in each extraction socket appropriately.  The surgical site was then  closed from distal #17 and extended to the mesial of #20 utilizing 3-0  chromic gut suture in a continuous interrupted suture technique x1.   At this point time a [employer] was then utilized to remove  significant accretions associated with the remaining teeth.  At this  time it was determined that tooth numbers 23, 24 and 25 and 26 had  significant mobility and will need to be extracted at this time.  Further gross debridement was achieved utilizing a series of hand  curettes of tooth numbers 21, 22, 27, 28, and 29.  Kavo Sonic scaler was  then again utilized to refine the removal of the accretions of these  teeth.  Tooth numbers 23, 24, 25 and 26 were then removed with a 151  forceps without complications.  Alveoplasty was then performed utilizing  rongeurs and bone file.  The surgical site was irrigated with copious amounts sterile saline.  The tissues were approximated and trimmed  appropriately.  A piece of Gelfoam was placed in each extraction socket  appropriately.  The surgical site was then closed utilizing 3-0 chromic  gut suture in a continuous interrupted suture technique from the mesial  of #22 and extended to the mesial of #27.  Two individual interrupted  sutures were then placed to further close the surgical site.  At this  point time the [place] was then again utilized to remove and  refine the removal of the accretions.   At this point time the entire mouth was irrigated with copious amounts  of sterile saline.  The patient was examined for complications, seeing  none, dental medicine procedure was deemed to be complete.  Throat pack  was removed at this time.  A series of 4x4 gauze were placed in the  mouth to aid hemostasis.  An oral airway was placed at request of the  anesthesia team.  At this point time the patient was handed over  to the  anesthesia team for final disposition.  After appropriate amount of time  the patient was extubated, taken to the post anesthesia care unit  with  stable vital signs and good oxygenation level.  All counts were correct  for dental medicine procedure.  The patient will be given appropriate  pain medication as well as the use of Amicar 5% rinse rinsing with a 10  amounts every hour for the next  10 hours to assist in hemostasis.  A CBC will be ordered by the internal  medicine team and the patient will be administered blood products as  indicated. The patient will be continued on IV antibiotic therapy until  oral medications can be taken and then Augmentin therapy will be  instituted for additional 4-5 days.      Lenn Cal, D.D.S.  Electronically Signed     RFK/MEDQ  D:  12/14/2006  T:  12/14/2006  Job:  [id number]   cc:   Evette Doffing, M.D.  Nelva Bush, M.D.

## 2011-03-13 NOTE — Procedures (Signed)
Kristy [name], Kristy [name]                 ACCOUNT NO.:  [id number]   MEDICAL RECORD NO.:  [id number]          PATIENT TYPE:  OUT   LOCATION:  SLEEP CENTER                 FACILITY:  [facility]   PHYSICIAN:  Clinton D. Annamaria Boots, M.D. DATE OF BIRTH:  03-13-1956   DATE OF STUDY:  11/17/2005                              NOCTURNAL POLYSOMNOGRAM   REFERRING PHYSICIAN:  Dr. Wende Bushy.   DATE OF STUDY:  November 17, 2005.   INDICATION FOR STUDY:  Hypersomnia with sleep apnea.   EPWORTH SLEEPINESS SCORE:  1/24.   BMI:  37.6.   WEIGHT:  213 pounds.   HOME MEDICATIONS:  HCTZ, atenolol, diltiazem, Lisinopril.   SLEEP ARCHITECTURE:  Total sleep time 374 minutes with sleep efficiency 85%.  Stage I was 4%, stage II 73%, stages III and IV 11%, REM 11%. Sleep latency  19 minutes, REM latency 99 minutes, awake after sleep onset 43 minutes,  arousal index increased 34.6. No bedtime medication was reported.   RESPIRATORY DATA:  NPSG protocol requested:  Apnea/hypopnea index (AHI, RDI)  49.7 obstructive events per hour indicating moderately severe obstructive  sleep apnea/hypopnea syndrome. This included 94 obstructive apneas and 216  hypopneas. Events were not positional. REM AHI 85.3 per hour.   OXYGEN DATA:  Moderate to loud snoring with oxygen desaturation to a nadir  of 83%. Mean oxygen saturation through the study was 94% on room air.   CARDIAC DATA:  Normal sinus rhythm.   MOVEMENT/PARASOMNIA:  Occasional leg jerk, insignificant.   IMPRESSION/RECOMMENDATIONS:  1.  Moderately severe obstructive sleep apnea/hypopnea syndrome, AHI 49.7      per hour with nonpositional events,      moderate to loud snoring and oxygen desaturation to 85%.  2.  Consider return for C-PAP titration or evaluate for alternative      therapies as appropriate.      Clinton D. Annamaria Boots, M.D.  [place]  Electronically Signed     CDY/MEDQ  D:  11/22/2005 12:17:55  T:  11/23/2005  02:07:46  Job:  [id number]

## 2011-03-13 NOTE — Consult Note (Signed)
NAMEJOIE, FAKHOURY                 ACCOUNT NO.:  [id number]   MEDICAL RECORD NO.:  [id number]          PATIENT TYPE:  INP   LOCATION:  2626                         FACILITY:  [facility]   PHYSICIAN:  Lenn Cal, D.D.S.DATE OF BIRTH:  22-Aug-1956   DATE OF CONSULTATION:  DATE OF DISCHARGE:                                 CONSULTATION   Audio too short to transcribe (less than 5 seconds)      Lenn Cal, D.D.S.     RFK/MEDQ  D:  12/10/2006  T:  12/10/2006  Job:  [id number]

## 2011-03-13 NOTE — Op Note (Signed)
NAMECAELIN, CASSEL                 ACCOUNT NO.:  [id number]   MEDICAL RECORD NO.:  [id number]          PATIENT TYPE:  OIB   LOCATION:  6526                         FACILITY:  [facility]   PHYSICIAN:  Ethelle Lyon, M.D. LHCDATE OF BIRTH:  04-Sep-1956   DATE OF PROCEDURE:  03/03/2006  DATE OF DISCHARGE:                                 OPERATIVE REPORT   PROCEDURE:  Selective bilateral renal angiography, balloon angioplasty of  the left renal artery.   INDICATIONS:  Ms. Buse is a 55 year old woman with several years of  hypertension that has not been controlled despite compliance with four  medications.  Dr. 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  She presents today for  catheter based angiography and possible balloon angioplasty of the  fibromuscular dysplasia.   PROCEDURE TECHNIQUE:  Informed consent was obtained.  Under 1% lidocaine  local anesthesia, a 5-French sheath was placed in the right common femoral  artery using modified Seldinger technique.  A pigtail catheter was advanced  to suprarenal abdominal aorta.  Abdominal aortography was performed by power  injection.  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  There were  single renal arteries bilaterally.  Selective bilateral renal angiography  was then performed using a 5-French LIMA catheter.  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  The right renal artery is normal.   Anticoagulation was initiated with heparin to achieve and maintain an ACT of  greater than 225 seconds.  Sheath was upsized over wire to 6-French.  A 6-  [place] JR-4 guide was advanced over wire engaging the ostium of the left  renal artery.  A stabilizer wire was advanced to the midportion of the renal  vessels.  The region of fibromuscular dysplasia was then treated with  balloon angioplasty for a total of three inflations using a 5.0 x 20 mm  Aviator balloon at  6 atmospheres.  Final angiography demonstrated marked  improvement in the region of fibromuscular dysplasia.  She was then  transferred to holding room in stable condition.   COMPLICATIONS:  None.   FINDINGS:  1.  Aorta:  Angiographically normal.  No evidence of aneurysm, infrarenal      abdominal aorta.  2.  Right renal artery:  Single vessel which is angiographically normal.  3.  Left renal artery:  Single vessel.  There is segment of fibromuscular      dysplasia over approximately 25 mm in the midportion of the vessel.      This was treated with balloon angioplasty.   COMPLICATIONS:  None.   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  Will observe her in  hospital overnight.      Ethelle Lyon, M.D. [facility]  Electronically Signed     WED/MEDQ  D:  03/03/2006  T:  03/04/2006  Job:  [id number]   cc:   Louis Meckel, M.D.  Fax: [phone]   Jill Poling, MD Internal Medicine Prog

## 2011-03-13 NOTE — Discharge Summary (Signed)
Kristy [name], Kristy [name]                 ACCOUNT NO.:  [id number]   MEDICAL RECORD NO.:  [id number]          PATIENT TYPE:  INP   LOCATION:  [id number]                         FACILITY:  [facility]   PHYSICIAN:  Thomes Lolling, M.D.    DATE OF BIRTH:  01-07-1956   DATE OF ADMISSION:  05/05/2005  DATE OF DISCHARGE:  05/08/2005                                 DISCHARGE SUMMARY   DISCHARGE DIAGNOSES:  1.  Hypertensive urgency.  2.  Microalbuminuria with estimated protein loss of 0.39 grams a day.  3.  Hyperglycemia with an hemoglobin A1c of 6.2.  4.  Mild to moderate left ventricular hypertrophy.  5.  Questionable sleep apnea versus paroxysmal nocturnal dyspnea.   DISCHARGE MEDICATIONS:  1.  Atenolol 50 mg p.o. daily.  2.  Hydrochlorothiazide 25 mg p.o. daily.  3.  Nifedipine 30 mg p.o. daily.   DISCHARGE DISPOSITION:  [name] blood pressure was a little bit better  controlled, but she will need followup in outpatient clinic by Dr. Delman Kitten in  the next two weeks since her anti-hypertensive medication results were not  optimum yet.  She will also need to follow on a CBC and a BMET to make sure  her creatinine and hemoglobin come back to normal.  Other issues that will  need to be assessed on her next visit are her microalbuminuria,  hyperglycemia, and questionable story of sleep apnea.   PROCEDURES:  CT scan of the chest showed no evidence of pulmonary embolism.  Two-D echo showed an ejection fraction of 50 to 55% with mild to moderate  left ventricular hypertrophy and mild left atrium dilation.  A renal  ultrasound showed normal sized kidneys, no hydronephrosis, and minimal,  diffuse increase in echogenicity.   ADMITTING HISTORY AND PHYSICAL:  Kristy [name] is a 55 year old, African-  American woman, who had no prior medical history but did not really see any  doctor for many, many years.  She presented at the emergency department with  a one-month history of shortness of breath with new onset  ankle swelling,  symptoms that have been progressive over the past month, worsening to the  point where she felt she needed medical care.  She denies any history of  chest pain, dyspnea on exertion, diaphoresis, palpitations, or syncope.  She  denies any cardiac disease or cardiovascular symptoms like claudication,  numbness, tingling, or ulcers.  She also noted the presence of nocturnal  cough.   PHYSICAL EXAMINATION:  VITAL SIGNS:  Pulse of 117, blood pressure of  208/125, temperature of 98 degrees, respiratory rate 24, saturation of 97%  on 2 liters.  NECK:  She had slight jugular vein distention.  LUNGS:  Clear to auscultation bilaterally.  HEART:  Regular rate and rhythm with a normal S1 and S2, but she had an S4.  No murmurs.  EXTREMITIES:  1+ pitting edema on both her legs.  NEUROLOGIC:  Normal.   LABORATORY DATA:  White blood cells at 13.4, hemoglobin 12.3, hematocrit  37.7, platelets 352.  Sodium 140, potassium 4.0, chloride 109, CO2 25, BUN  15, creatinine 1.3,  glucose 128.  Liver function tests were normal.  She had  a BNP of 250, urinalysis that showed more than 300 of protein, her D-dimers  were at 2.47, CK-MBs at 3.3, troponins were at 0.41.   HOSPITAL COURSE:  She was then started on Metoprolol and Lasix.  Over the  course of her hospitalization, her blood pressure quickly decreased to the  [id number] for a systolic, over the [id number] for a diastolic.  She experienced  one more episode of shortness of breath while hospitalized, which was when  she was on the phone with her husband with whom she says she has a lot of  stress at home.  We later clarified that the episodes of shortness of breath  with tightness in her chest usually lasted about five minutes, were never  brought on by activity or eating, and were usually happening after she came  home from work.  She is under a lot of stress at home at the moment with her  husband.  She did not elaborate on the subject.  As  an additional problem,  she noted that she snores a lot and often wakes up with a choking sensation  during the night.  She does not complain of any diurnal sleepiness or  headaches but would like [place] to investigate this problem as an outpatient.   DISCHARGE LABORATORY DATA AND VITAL SIGNS:  Her white blood count was down  to 10.9, hemoglobin back to normal at 12.2, hematocrit of 38.1, platelets  343.  Sodium at 137, potassium 3.6, chloride 103, CO2 27, BUN 15, creatinine  1.6, glucose 112.  The troponins have stayed stable and were 0.37 at  discharge, so we attributed this chronically elevated value to left  ventricular hypertrophy.  During hospitalization, we also checked her lipid  panel, which showed a cholesterol of 216, triglycerides of 102, HDL is 46,  LDL is 150, HB-A1c was 6.2, a TSH level was normal at 1.623.  Her vital  signs at discharge showed a temperature of 97.7, pulse of 105, respiratory  rate of 20, blood pressure 186/112, and saturation of 99% on room air.   PENDING LABORATORY DATA:  She will have a BMET and CBC on the day of her  hospital followup, which should be on May 20, 2005, at 2 p.m. at the  outpatient clinic with Dr. Delman Kitten.      Veroni   VD/MEDQ  D:  05/11/2005  T:  05/11/2005  Job:  [id number]   cc:   Mohammed Kindle, M.D.  Fax: [phone]

## 2011-03-20 ENCOUNTER — Inpatient Hospital Stay (HOSPITAL_COMMUNITY): Payer: 59

## 2011-03-20 ENCOUNTER — Inpatient Hospital Stay (HOSPITAL_COMMUNITY)
Admission: RE | Admit: 2011-03-20 | Discharge: 2011-03-24 | DRG: 470 | Disposition: A | Discharge status: Home Health | Payer: 59 | Source: Ambulatory Visit | Attending: Orthopaedic Surgery | Admitting: Orthopaedic Surgery

## 2011-03-20 DIAGNOSIS — E119 Type 2 diabetes mellitus without complications: Secondary | ICD-10-CM | POA: Diagnosis present

## 2011-03-20 DIAGNOSIS — G4733 Obstructive sleep apnea (adult) (pediatric): Secondary | ICD-10-CM | POA: Diagnosis present

## 2011-03-20 DIAGNOSIS — M1710 Unilateral primary osteoarthritis, unspecified knee: Principal | ICD-10-CM | POA: Diagnosis present

## 2011-03-20 DIAGNOSIS — R110 Nausea: Secondary | ICD-10-CM | POA: Diagnosis not present

## 2011-03-20 DIAGNOSIS — I10 Essential (primary) hypertension: Secondary | ICD-10-CM | POA: Diagnosis present

## 2011-03-20 DIAGNOSIS — Z01812 Encounter for preprocedural laboratory examination: Secondary | ICD-10-CM

## 2011-03-20 LAB — ABO/RH: ABO/RH(D): A POS

## 2011-03-20 LAB — GLUCOSE, CAPILLARY
Glucose-Capillary: 119 mg/dL — ABNORMAL HIGH (ref 70–99)
Glucose-Capillary: 130 mg/dL — ABNORMAL HIGH (ref 70–99)

## 2011-03-21 LAB — BASIC METABOLIC PANEL
BUN: 12 mg/dL (ref 6–23)
Calcium: 8.9 mg/dL (ref 8.4–10.5)
Chloride: 103 mEq/L (ref 96–112)
Creatinine, Ser: 1.04 mg/dL (ref 0.4–1.2)
GFR calc Af Amer: >60 mL/min (ref >60)
GFR calc non Af Amer: 55 mL/min — ABNORMAL LOW (ref >60)

## 2011-03-21 LAB — CBC
MCH: 25.5 pg — ABNORMAL LOW (ref 26.0–34.0)
MCV: 80.6 fL (ref 78.0–100.0)
Platelets: 226 10*3/uL (ref 150–400)
RBC: 4.95 MIL/uL (ref 3.87–5.11)
RDW: 15.5 % (ref 11.5–15.5)

## 2011-03-21 LAB — GLUCOSE, CAPILLARY
Glucose-Capillary: 153 mg/dL — ABNORMAL HIGH (ref 70–99)
Glucose-Capillary: 137 mg/dL — ABNORMAL HIGH (ref 70–99)

## 2011-03-21 NOTE — Op Note (Signed)
NAMECHELE, Kristy [name]                 ACCOUNT NO.:  [id number]  MEDICAL RECORD NO.:  [id number]           PATIENT TYPE:  I  LOCATION:  [id number]                         FACILITY:  [facility]  PHYSICIAN:  Lind Guest. Ninfa Linden, M.D.DATE OF BIRTH:  31-Mar-1956  DATE OF PROCEDURE: DATE OF DISCHARGE:                              OPERATIVE REPORT   PREOPERATIVE DIAGNOSIS:  Severe osteoarthritis and degenerative joint disease, right knee.  POSTOPERATIVE DIAGNOSIS:  Severe osteoarthritis and degenerative joint disease, right knee.  PROCEDURE:  Right total knee arthroplasty utilizing computer navigation as assistant.  IMPLANTS:  [employer] rotating platform knee with size 3 femur, size 3 tibial tray, 12.5 mm polyethylene insert, 35 mm patellar button.  SURGEON:  Lind Guest. Ninfa Linden, M.D.  ANESTHESIA: 1. Regional femoral block, right leg. 2. General.  ANTIBIOTICS:  1 g IV Ancef.  BLOOD LOSS:  100 cc.  TOURNIQUET TIME:  Less than 3 hours.  COMPLICATIONS:  None.  INDICATIONS:  Ms. Govier is a 55 year old female with debilitating end- stage arthritis involving her right knee.  After failure of conservative treatment, she wished to proceed with a total knee arthroplasty.  The risks and benefits of this were explained to her in detail and she did wish to proceed with surgery.  PROCEDURE DESCRIPTION:  After informed consent was obtained, appropriate right leg was marked.  Anesthesia obtained with femoral nerve block. She was then brought to the operating room and placed supine on the operating table.  General anesthesia was then obtained.  A Foley catheter was placed and then a non-sterile tourniquet was placed around her upper right leg.  The leg was prepped and draped from the thigh down to the ankle with DuraPrep and sterile drapes including a sterile stockinette.  A time-out was called, which identified the correct patient and the correct right leg.  I then used an Esmarch  wrap out the leg and tourniquet was inflated to 300 mm pressure.  I then made an incision directly over the patella and carried this proximally and distally.  I was able to dissect down to the knee and then I performed a medial peripatellar arthrotomy.  There was large effusion encountered and significant synovitis in her knee.  I then proceeded with computer navigation portion of the case after removing all heads at the ACL, PCL, medial, and lateral meniscus and osteophytes throughout the knee.  Two Steinmann pins were then placed in the tibia in the anteromedial to posterolateral direction through 2 small stab incisions in the tibia. Through the main incision in the femur and the metaphysis of the femur, 2 pins were likewise placed in the anteromedial to posterolateral direction.  Off these pins, globes were placed so the computer navigation portion of the case could be undertaken.  We then were able to rotate the hip and flex the knee.  We also selected points throughout the knee and ankle to generate a computer model of her knee, showing significant varus and flexion contracture.  I released the tissue medially, and then based on the computer plan we made a tibial cut with 10 mm off the high  side.  We then balanced with soft tissue tension in flexion and extension to gain a computer model, we made a distal femoral cut at the proximal 10 mm.  This was based on a size 3 femur.  We then made chamfer cuts and the anterior and posterior cuts as well.  We then made a femoral block cut and turned attention back to the tibia.  We drilled for a post of the tibia and then placed a trial size 3 tibia followed by the size 3 femur.  I trailed a 10 mm and then a 12.5 mm polyethylene inserts, 12.5 mm one felt to be stable.  I then cut the patella and we drilled lugs for a size 35 patellar button.  We removed all trial instrumentation, after copiously irrigating the knee with pulsatile lavage  solution, we then cemented the real femoral component and the real tibial component, placed the real 12.5 mm polyethylene insert and cemented the patellar button as well.  Once the cement had dried, we let the tourniquet down and hemostasis was obtained with electrocautery.  We copiously irrigated the knee again with normal saline solution as well as pulsatile lavage and placed a medium Hemovac drain and closed the arthrotomy with interrupted #1 Vicryl followed by 0- Vicryl, 2-0 Vicryl, and staples on the skin.  The Steinmann pins were also removed and the distal Steinmann pin sites were closed with nylon suture.  Xeroform followed by well-padded sterile dressing was applied and the patient was awakened, extubated, and taken to the recovery room in stable condition.  All final counts were correct.     Lind Guest. Ninfa Linden, M.D.     CYB/MEDQ  D:  03/20/2011  T:  03/21/2011  Job:  [id number]  Electronically Signed by Jean Rosenthal M.D. on 03/21/2011 11:11:25 AM

## 2011-03-21 NOTE — H&P (Signed)
  NAMEMACAYLEE, [name]                 ACCOUNT NO.:  [id number]  MEDICAL RECORD NO.:  [id number]           PATIENT TYPE:  I  LOCATION:  0006                         FACILITY:  [facility]  PHYSICIAN:  Lind Guest. Ninfa Linden, M.D.DATE OF BIRTH:  1956-07-16  DATE OF ADMISSION:  03/20/2011 DATE OF DISCHARGE:                             HISTORY & PHYSICAL   CHIEF COMPLAINT:  Right knee pain with known severe osteoarthritis.  HISTORY OF PRESENT ILLNESS:  Kristy [name] is a 55 year old female who will be admitted today after undergoing elective right total knee arthroplasty.  She has debilitating pain in her right knee and she walks with a limp and a cane.  She has tried injections in that knee that have not helped __________where it is affecting her activities of daily living and she wishes to proceed with a total hip arthroplasty.  She understands the risks and benefits of this due to risk of infection and acute blood loss anemia as well as DVT and PE.  PAST MEDICAL HISTORY: 1. Type 2 diabetes. 2. Severe hypertension. 3. Sleep apnea.  ALLERGIES:  No known drug allergies.  MEDICATIONS: 1. Hydralazine. 2. Amlodipine. 3. Aspirin. 4. Benazepril. 5. Carvedilol. 6. Clonidine. 7. Lasix. 8. Gabapentin. 9. Lantus insulin. 10.Metformin. 11.Pravachol. 12.Tramadol.  SOCIAL HISTORY:  She works as a Training and development officer.  She does not smoke and does not drink.  She is married.  FAMILY MEDICAL HISTORY:  Diabetes and high blood pressure.  REVIEW OF SYSTEMS:  Negative for chest pain, shortness of breath, fever, chills, nausea, vomiting.  PHYSICAL EXAMINATION:  VITAL SIGNS:  She is afebrile.  Stable vital signs. GENERAL:  She is alert and oriented x3, in no acute distress, without discomfort. HEENT:  Normocephalic, atraumatic.  Pupils equal, round and reactive to light. NECK:  Supple. LUNGS:  Clear to auscultation bilaterally. HEART:  Regular rate and rhythm. ABDOMEN:  Benign. EXTREMITIES:  Right knee  shows severe pain with range of motion and joint line tenderness.  X-rays show severe end-stage arthritis of the right knee.  ASSESSMENT:  This is a 55 year old female with debilitating arthritis of her right knee.  PLAN:  We will proceed with a total knee arthroplasty today.  Again, she understands the risks and benefits of this in detail and does wish to proceed with surgery.     Lind Guest. Ninfa Linden, M.D.     CYB/MEDQ  D:  03/20/2011  T:  03/20/2011  Job:  [id number]  Electronically Signed by Jean Rosenthal M.D. on 03/21/2011 11:11:23 AM

## 2011-03-21 NOTE — Op Note (Signed)
  NAMEGEA, HALBROOK                 ACCOUNT NO.:  [id number]  MEDICAL RECORD NO.:  [id number]           PATIENT TYPE:  I  LOCATION:  [id number]                         FACILITY:  [facility]  PHYSICIAN:  Lind Guest. Ninfa Linden, M.D.DATE OF BIRTH:  01-Jul-1956  DATE OF PROCEDURE:  03/20/2011 DATE OF DISCHARGE:                              OPERATIVE REPORT   Ms Bucklew is a 55 year old female with debilitating arthritis involving her right knee with a significant varus deformity and bone-on-bone wear in the medial compartment and the patellofemoral joint.  At this point, given the impact on her activities of her daily living, she wished to receive the   CANCELLED DICTATION     Lind Guest. Ninfa Linden, M.D.     CYB/MEDQ  D:  03/20/2011  T:  03/21/2011  Job:  [id number]  Electronically Signed by Jean Rosenthal M.D. on 03/21/2011 11:11:27 AM

## 2011-03-22 LAB — BASIC METABOLIC PANEL
BUN: 12 mg/dL (ref 6–23)
CO2: 26 mEq/L (ref 19–32)
Calcium: 8.7 mg/dL (ref 8.4–10.5)
Chloride: 99 mEq/L (ref 96–112)
Creatinine, Ser: 1.05 mg/dL (ref 0.4–1.2)
GFR calc Af Amer: >60 mL/min (ref >60)
Glucose, Bld: 146 mg/dL — ABNORMAL HIGH (ref 70–99)

## 2011-03-22 LAB — CBC
HCT: 39.1 % (ref 36.0–46.0)
Hemoglobin: 12.9 g/dL (ref 12.0–15.0)
MCH: 26.4 pg (ref 26.0–34.0)
MCHC: 33 g/dL (ref 30.0–36.0)
MCV: 80 fL (ref 78.0–100.0)
RBC: 4.89 MIL/uL (ref 3.87–5.11)

## 2011-03-22 LAB — GLUCOSE, CAPILLARY: Glucose-Capillary: 123 mg/dL — ABNORMAL HIGH (ref 70–99)

## 2011-03-23 LAB — GLUCOSE, CAPILLARY
Glucose-Capillary: 125 mg/dL — ABNORMAL HIGH (ref 70–99)
Glucose-Capillary: 98 mg/dL (ref 70–99)
Glucose-Capillary: 124 mg/dL — ABNORMAL HIGH (ref 70–99)

## 2011-03-23 LAB — CBC
Hemoglobin: 11.8 g/dL — ABNORMAL LOW (ref 12.0–15.0)
MCV: 79.5 fL (ref 78.0–100.0)
Platelets: 221 10*3/uL (ref 150–400)
RBC: 4.48 MIL/uL (ref 3.87–5.11)
WBC: 12.8 10*3/uL — ABNORMAL HIGH (ref 4.0–10.5)

## 2011-03-23 LAB — BASIC METABOLIC PANEL
Calcium: 8.5 mg/dL (ref 8.4–10.5)
Chloride: 102 mEq/L (ref 96–112)
Creatinine, Ser: 1.22 mg/dL — ABNORMAL HIGH (ref 0.4–1.2)
GFR calc Af Amer: 56 mL/min — ABNORMAL LOW (ref >60)
Sodium: 136 mEq/L (ref 135–145)

## 2011-03-28 NOTE — Discharge Summary (Signed)
  Kristy [name], Kristy [name]                 ACCOUNT NO.:  [id number]  MEDICAL RECORD NO.:  [id number]           PATIENT TYPE:  I  LOCATION:  [id number]                         [facility]ACILITY:  [facility]  PHYSICIAN:  Lind Guest. Ninfa Linden, M.D.DATE OF BIRTH:  [birth year]  DATE OF ADMISSION:  03/20/2011 DATE OF DISCHARGE:  03/24/2011                              DISCHARGE SUMMARY   ADMITTING DIAGNOSES:  Severe osteoarthritis and degenerative joint disease, right knee.  DISCHARGE DIAGNOSES:  Severe osteoarthritis and degenerative joint disease, right knee.  PROCEDURE:  Right total knee arthroplasty on Mar 20, 2011.  HOSPITAL COURSE:  [name] is a 55 year old female with debilitating arthritis involving her right knee.  She was taken to the operating room on the day of admission for an elective total knee arthroplasty.  This was performed without complications and she was admitted as an inpatient to the orthopedic floor.  Her postoperative course was only slowed by nausea, but otherwise she did well.  By the day of discharge, the incision was clean, dry, and intact.  She was tolerating oral pain medicines as well as regular diet and it was felt she could be discharged safely to home.  DISPOSITION:  Discharged to home.  DISCHARGE INSTRUCTIONS:  While she is at home, Physical Therapy will come in to work on range of motion of her right knee, balance, gait training, and coordination.  A followup appointment will be established in my office in 2 weeks.  She can get her knee incision wet in the shower starting Mar 26, 2011.  DISCHARGE MEDICATIONS: 1. Percocet as needed for pain. 2. Robaxin as needed for spasms. 3. Aspirin 325 mg p.o. twice daily for 1 week, then once daily for 1     week, and then she will resume her 81 mg enteric-coated aspirin. 4. Lantus. 5. Clonidine. 6. Tramadol. 7. Amlodipine. 8. Pravastatin. 9.  Lasix. 10.Hydralazine. 11.Metformin. 12.Benazepril. 13.Carvedilol.     Lind Guest. Ninfa Linden, M.D.     CYB/MEDQ  D:  03/24/2011  T:  03/24/2011  Job:  [id number]  Electronically Signed by Jean Rosenthal M.D. on 03/28/2011 09:41:33 AM

## 2011-04-28 ENCOUNTER — Other Ambulatory Visit: Payer: Self-pay | Admitting: Internal Medicine

## 2011-04-28 ENCOUNTER — Other Ambulatory Visit: Payer: Self-pay | Admitting: Family Medicine

## 2011-04-28 DIAGNOSIS — Z1231 Encounter for screening mammogram for malignant neoplasm of breast: Secondary | ICD-10-CM

## 2011-05-04 DIAGNOSIS — K0530 Chronic periodontitis, unspecified: Secondary | ICD-10-CM

## 2011-05-08 ENCOUNTER — Ambulatory Visit (HOSPITAL_COMMUNITY)
Admission: RE | Admit: 2011-05-08 | Discharge: 2011-05-08 | Disposition: A | Discharge status: Home / Self Care | Payer: 59 | Source: Ambulatory Visit | Attending: Family Medicine | Admitting: Family Medicine

## 2011-05-08 DIAGNOSIS — Z1231 Encounter for screening mammogram for malignant neoplasm of breast: Secondary | ICD-10-CM | POA: Insufficient documentation

## 2011-05-20 ENCOUNTER — Ambulatory Visit (HOSPITAL_COMMUNITY): Payer: 59 | Admitting: Dentistry

## 2011-05-20 DIAGNOSIS — K0530 Chronic periodontitis, unspecified: Secondary | ICD-10-CM

## 2011-05-20 DIAGNOSIS — K036 Deposits [accretions] on teeth: Secondary | ICD-10-CM

## 2011-05-27 ENCOUNTER — Encounter: Payer: 59 | Admitting: Internal Medicine

## 2011-06-09 ENCOUNTER — Encounter: Payer: Self-pay | Admitting: Internal Medicine

## 2011-06-09 ENCOUNTER — Ambulatory Visit (INDEPENDENT_AMBULATORY_CARE_PROVIDER_SITE_OTHER): Payer: 59 | Admitting: Internal Medicine

## 2011-06-09 VITALS — BP 185/116 | HR 99 | Temp 97.0°F | Ht 63.0 in | Wt 219.0 lb

## 2011-06-09 DIAGNOSIS — E119 Type 2 diabetes mellitus without complications: Secondary | ICD-10-CM

## 2011-06-09 DIAGNOSIS — I10 Essential (primary) hypertension: Secondary | ICD-10-CM

## 2011-06-09 DIAGNOSIS — E785 Hyperlipidemia, unspecified: Secondary | ICD-10-CM

## 2011-06-09 DIAGNOSIS — L0390 Cellulitis, unspecified: Secondary | ICD-10-CM

## 2011-06-09 LAB — GLUCOSE, CAPILLARY: Glucose-Capillary: 125 mg/dL — ABNORMAL HIGH (ref 70–99)

## 2011-06-09 LAB — LIPID PANEL
Cholesterol: 181 mg/dL (ref 0–200)
HDL: 46 mg/dL
LDL Cholesterol: 115 mg/dL — ABNORMAL HIGH (ref 0–99)
Total CHOL/HDL Ratio: 3.9 ratio
Triglycerides: 98 mg/dL
VLDL: 20 mg/dL (ref 0–40)

## 2011-06-09 LAB — POCT GLYCOSYLATED HEMOGLOBIN (HGB A1C): Hemoglobin A1C: 6.3

## 2011-06-09 LAB — COMPREHENSIVE METABOLIC PANEL
CO2: 25 mEq/L (ref 19–32)
Calcium: 10.1 mg/dL (ref 8.4–10.5)
Creat: 1.12 mg/dL — ABNORMAL HIGH (ref 0.50–1.10)
Glucose, Bld: 129 mg/dL — ABNORMAL HIGH (ref 70–99)
Sodium: 139 mEq/L (ref 135–145)
Total Bilirubin: 0.6 mg/dL (ref 0.3–1.2)
Total Protein: 7.4 g/dL (ref 6.0–8.3)

## 2011-06-09 NOTE — Assessment & Plan Note (Signed)
We'll check liver function tests today as well as cholesterol panel and we'll readjust medication regimen if indicated. Diet and exercise discussed with patient.

## 2011-06-09 NOTE — Progress Notes (Signed)
  Subjective:    Patient ID: Kristy [name], female    DOB: Dec 13, 1955, 55 y.o.   MRN: [id number]  HPI  Patient 55 year old female with past medical history outlined below who presents to clinic for regular followup on blood pressure. She has had recent knee surgery May 2012 (right knee) and has not had a chance to followup with Dr. Ninfa Linden for surgery follow up. Since the surgery she has been doing physical therapy but over the past week she has noticed progressive redness, swelling, warm to touch in her right lower extremity extending from knee down to the ankle. She has tried over-the-counter ibuprofen with minimal relief and she has also tried applying ice to the affected area with no relief. She denies fevers or chills, systemic symptoms of weight loss or night sweats. She denies other recent sicknesses or hospitalizations, no episodes of chest pain, cough, shortness of breath, no abdominal or urinary concerns. She is able to ambulate and perform activities of daily living with mild difficulty due to the recent right knee surgery. She is doing physical therapy several times a week.  Review of Systems Per history of present illness     Objective:   Physical Exam  Constitutional: Vital signs reviewed.  Patient is a well-developed and well-nourished  in no acute distress and cooperative with exam. Alert and oriented x3.  Cardiovascular: RRR, S1 normal, S2 normal, no MRG, pulses symmetric and intact bilaterally Pulmonary/Chest: CTAB, no wheezes, rales, or rhonchi Abdominal: Soft. Non-tender, non-distended, bowel sounds are normal, no masses, organomegaly, or guarding present.  Musculoskeletal: Full range of motion in all extremities, erythema and warmth to touch noted in the right lower extremity extending from knee to the right ankle, tenderness to palpation over the right lower extremity in the area of erythema surgery scar noted over the anterior right knee, appears to be healing well, no other  area of erythema noted Neurological: A&O x3, Strenght is normal and symmetric bilaterally, cranial nerve II-XII are grossly intact, no focal motor deficit, sensory intact to light touch bilaterally.          Assessment & Plan:

## 2011-06-09 NOTE — Assessment & Plan Note (Signed)
Status post knee surgery, right side. Physical exam findings consistent with cellulitis. I have discussed with patient importance of keeping the extremity elevated when possible. We'll prescribe 10 day course of antibiotic Bactrim double strength twice daily. Patient was advised if her swelling gets worse if she develops systemic symptoms along with fevers and chills she needs to call us back for further evaluation.

## 2011-06-09 NOTE — Assessment & Plan Note (Signed)
Blood pressure is still uncontrolled and this is most likely secondary to noncompliance with medications. Patient tells me she has ran out of to medications clonidine and Lasix approximately a week ago. We have discussed extensively the importance of compliance with medication regimen for which reason is no need to change regimen today rather assess size maintaining compliance with current medications. I discussed with patient checking blood pressure regularly at home or drugstore and writing the numbers down and bringing the notes with her to her next visit today we can review it and readjust medication regimen if indicated. She does verbalize understanding and is willing to try. We will provide refills today on medications. We will also obtain electrolyte panel.

## 2011-06-09 NOTE — Assessment & Plan Note (Signed)
Diabetes appears to be well controlled on current medication regimen. We have discussed importance of compliance with medications, diet and exercise. Patient verbalizes understanding. We will continue metformin at current dose for as well as Lantus. No changes will be made today. I have examined her feet and the results of the physical exam are within normal limits.

## 2011-07-06 ENCOUNTER — Other Ambulatory Visit: Payer: Self-pay | Admitting: Internal Medicine

## 2011-07-07 ENCOUNTER — Encounter (HOSPITAL_COMMUNITY)
Admission: RE | Admit: 2011-07-07 | Discharge: 2011-07-07 | Disposition: A | Discharge status: Home / Self Care | Payer: 59 | Source: Ambulatory Visit | Attending: Orthopaedic Surgery | Admitting: Orthopaedic Surgery

## 2011-07-07 LAB — PROTIME-INR
INR: 0.91 (ref 0.00–1.49)
Prothrombin Time: 12.5 seconds (ref 11.6–15.2)

## 2011-07-07 LAB — SURGICAL PCR SCREEN: MRSA, PCR: NEGATIVE

## 2011-07-07 LAB — BASIC METABOLIC PANEL
BUN: 21 mg/dL (ref 6–23)
CO2: 27 mEq/L (ref 19–32)
Chloride: 105 mEq/L (ref 96–112)
GFR calc Af Amer: >60 mL/min (ref >60)
Potassium: 4.4 mEq/L (ref 3.5–5.1)

## 2011-07-07 LAB — CBC
HCT: 43.9 % (ref 36.0–46.0)
Hemoglobin: 14.3 g/dL (ref 12.0–15.0)
MCH: 25 pg — ABNORMAL LOW (ref 26.0–34.0)
MCHC: 32.6 g/dL (ref 30.0–36.0)
MCV: 76.6 fL — ABNORMAL LOW (ref 78.0–100.0)

## 2011-07-09 ENCOUNTER — Ambulatory Visit (HOSPITAL_COMMUNITY)
Admission: RE | Admit: 2011-07-09 | Discharge: 2011-07-10 | Disposition: A | Discharge status: Home / Self Care | Payer: 59 | Source: Ambulatory Visit | Attending: Orthopaedic Surgery | Admitting: Orthopaedic Surgery

## 2011-07-09 DIAGNOSIS — Z01812 Encounter for preprocedural laboratory examination: Secondary | ICD-10-CM | POA: Insufficient documentation

## 2011-07-09 DIAGNOSIS — Z79899 Other long term (current) drug therapy: Secondary | ICD-10-CM | POA: Insufficient documentation

## 2011-07-09 DIAGNOSIS — M25669 Stiffness of unspecified knee, not elsewhere classified: Secondary | ICD-10-CM | POA: Insufficient documentation

## 2011-07-09 DIAGNOSIS — Z794 Long term (current) use of insulin: Secondary | ICD-10-CM | POA: Insufficient documentation

## 2011-07-09 DIAGNOSIS — E119 Type 2 diabetes mellitus without complications: Secondary | ICD-10-CM | POA: Insufficient documentation

## 2011-07-09 DIAGNOSIS — E669 Obesity, unspecified: Secondary | ICD-10-CM | POA: Insufficient documentation

## 2011-07-09 DIAGNOSIS — I10 Essential (primary) hypertension: Secondary | ICD-10-CM | POA: Insufficient documentation

## 2011-07-09 LAB — GLUCOSE, CAPILLARY
Glucose-Capillary: 139 mg/dL — ABNORMAL HIGH (ref 70–99)
Glucose-Capillary: 126 mg/dL — ABNORMAL HIGH (ref 70–99)

## 2011-07-12 NOTE — H&P (Signed)
  NAMEELEONORE, GUINDON                 ACCOUNT NO.:  [id number]  MEDICAL RECORD NO.:  [id number]  LOCATION:  SDSC                         FACILITY:  [place]  PHYSICIAN:  Lind Guest. Ninfa Linden, M.D.DATE OF BIRTH:  1955/11/20  DATE OF ADMISSION:  07/09/2011 DATE OF DISCHARGE:                             HISTORY & PHYSICAL   CHIEF COMPLAINT:  Right knee stiffness, status post total knee arthroplasty.  HISTORY OF PRESENT ILLNESS:  Ms. Schiemann is now 3 months status post right total knee arthroplasty.  She is plateaued in terms of range of motion and I think it is certainly bothering her.  She lacks full extension about 5 degrees and her flexion is only to about 65-70 degrees.  We have talked to her about proceeding with a manipulation under anesthesia and then admitting for pain control and CPM.  She understands it.  We will begin her into physical therapy as an outpatient soon after to work on continuing  increased range of motion.  She understands the risks and benefits of the surgery in detail and does wished to proceed with surgery.  PAST MEDICAL HISTORY: 1. Diabetes. 2. High blood pressure. 3. Sleep apnea.  MEDICATIONS: 1. Amlodipine. 2. Aspirin. 3. Benazepril. 4. Carvedilol. 5. Clonidine. 6. Lasix. 7. Gabapentin. 8. Hydralazine. 9. Lantus insulin. 10.Glucophage. 11.Pravachol. 12.Ultram.  ALLERGIES:  No known drug allergies.  SOCIAL HISTORY:  She lives at home.  She is married.  She works as a Training and development officer.  Right now, she does not smoke.  REVIEW OF SYSTEMS:  Negative for chest pain, shortness of breath, fever, chills, nausea, and vomiting.  FAMILY HISTORY:  Obese, diabetes, high blood pressure.  PHYSICAL EXAMINATION:  VITAL SIGNS:  She is afebrile with stable vital signs. GENERAL:  She is alert and oriented x3 in no acute distress or obvious discomfort. HEENT:  Normocephalic atraumatic. Pupils equal, round, and reactive to light.  She has dentures. NECK:   Supple. LUNGS:  Clear to auscultation bilaterally. HEART:  Regular rate and rhythm. ABDOMEN:  Benign. EXTREMITIES:  Right knee shows severe arthrofibrosis, range of motion lacking full extension by about 5 degrees, only about 65-70 degrees of flexion.  ASSESSMENT:  This is a 55 year old female with postoperative arthrofibrosis, status post a right total knee arthroplasty.  PLAN:  Our plan will be to proceed today with manipulation under anesthesia and then admit for CPM and pain control.  She understands this and we will proceed with surgery as stated.     Lind Guest. Ninfa Linden, M.D.     CYB/MEDQ  D:  07/09/2011  T:  07/09/2011  Job:  [id number]  Electronically Signed by Jean Rosenthal M.D. on 07/12/2011 07:05:12 AM

## 2011-07-12 NOTE — Op Note (Signed)
  [name], Kristy [name]                 ACCOUNT NO.:  [id number]  MEDICAL RECORD NO.:  [id number]  LOCATION:  [id number]                         FACILITY:  [place]  PHYSICIAN:  Lind Guest. 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  PROCEDURE:  Right knee manipulation under anesthesia.  SURGEON:  Lind Guest. Ninfa Linden, MD  ANESTHESIA:  General.  BLOOD LOSS:  Not applicable.  COMPLICATIONS:  None.  PREOPERATIVE RANGE OF MOTION:  Minus 10 degrees to 65 degrees.  POSTMANIPULATION RANGE OF MOTION:  Minus 30 degrees to 115 degrees.  INDICATION:  [name] is a 55 year old female with diabetes who underwent a right total knee arthroplasty about 3 months ago.  She is plateaued with physical therapy and at this point we need to proceed with the manipulation under anesthesia due to her postoperative range of motion plateaued about 70 degrees at the most of flexion.  She likes full extension between 5-10 degrees.  She understands the need for manipulation under anesthesia and wishes to do so.  PROCEDURE DESCRIPTION:  After informed consent was obtained, appropriate right knee was marked.  She was brought to the operating room, placed supine on the operating table.  General anesthesia was then obtained.  A time-out was called and she was identified as correct patient, correct right knee.  I then performed a general manipulation going through flexion extension of her knee and felt scar tissue releasing.  I was eventually able to get her to about 3-5 degrees of full extension to easily 110-115 degrees of flexion.  I put this through several cycles of range of motion.  I then cleaned her knee with ChloraPrep swabs  prior to injection of 4 mL of Marcaine and 1 mL of Depo- Medrol into the knee.  A Band-Aid was placed.  She was awakened, extubated, and taken to recovery room in stable condition. Postoperatively, she will be admitted for 23-hour observation so that I can get her on continuous passive motion/CPM machine and pain control.     Lind Guest. Ninfa Linden, M.D.     CYB/MEDQ  D:  07/09/2011  T:  07/09/2011  Job:  [id number]  Electronically Signed by Jean Rosenthal M.D. on 07/12/2011 07:05:15 AM

## 2011-09-22 ENCOUNTER — Other Ambulatory Visit: Payer: Self-pay | Admitting: Internal Medicine

## 2011-10-01 ENCOUNTER — Other Ambulatory Visit: Payer: Self-pay | Admitting: *Deleted

## 2011-10-05 MED ORDER — INSULIN GLARGINE 100 UNIT/ML ~~LOC~~ SOLN: 15.0000 [IU] | Freq: Every day | SUBCUTANEOUS | Status: DC

## 2011-10-05 NOTE — Telephone Encounter (Signed)
Please refill, pt waiting since 12/6 for refill

## 2011-11-23 ENCOUNTER — Other Ambulatory Visit (HOSPITAL_COMMUNITY): Payer: Self-pay | Admitting: Dentistry

## 2011-12-01 ENCOUNTER — Encounter: Payer: Self-pay | Admitting: Internal Medicine

## 2011-12-01 ENCOUNTER — Ambulatory Visit (INDEPENDENT_AMBULATORY_CARE_PROVIDER_SITE_OTHER): Payer: Self-pay | Admitting: Internal Medicine

## 2011-12-01 VITALS — BP 170/114 | HR 86 | Temp 97.1°F | Ht 63.0 in | Wt 221.7 lb

## 2011-12-01 DIAGNOSIS — G4700 Insomnia, unspecified: Secondary | ICD-10-CM

## 2011-12-01 DIAGNOSIS — I10 Essential (primary) hypertension: Secondary | ICD-10-CM

## 2011-12-01 DIAGNOSIS — E785 Hyperlipidemia, unspecified: Secondary | ICD-10-CM

## 2011-12-01 DIAGNOSIS — E119 Type 2 diabetes mellitus without complications: Secondary | ICD-10-CM

## 2011-12-01 LAB — BASIC METABOLIC PANEL WITH GFR
BUN: 16 mg/dL (ref 6–23)
CO2: 24 mEq/L (ref 19–32)
Chloride: 105 mEq/L (ref 96–112)
Creat: 1.06 mg/dL (ref 0.50–1.10)
GFR, Est African American: 68 mL/min
GFR, Est Non African American: 59 mL/min — ABNORMAL LOW
Potassium: 4.2 mEq/L (ref 3.5–5.3)

## 2011-12-01 LAB — GLUCOSE, CAPILLARY: Glucose-Capillary: 115 mg/dL — ABNORMAL HIGH (ref 70–99)

## 2011-12-01 LAB — POCT GLYCOSYLATED HEMOGLOBIN (HGB A1C): Hemoglobin A1C: 6.5

## 2011-12-01 MED ORDER — ZOLPIDEM TARTRATE ER 12.5 MG PO TBCR: 12.5000 mg | EXTENDED_RELEASE_TABLET | Freq: Every evening | ORAL | Status: DC | PRN

## 2011-12-01 NOTE — Patient Instructions (Signed)
1. Please stop taking Hydralazine and clonidine and come back in one month. We will adjust your medications again by then. 2. Please take all medications as prescribed.  3. If you have worsening of your symptoms or new symptoms arise, please call the clinic [phone]), or go to the ER immediately if symptoms are severe. 4.

## 2011-12-01 NOTE — Progress Notes (Signed)
Subjective:   Patient ID: SAMEERAH HARROP female   DOB: [birth year] 56 y.o.   MRN: [id number]  HPI:  Ms.Kida TAJI OBROCHTA is a 56 y.o.  female with past medical history outlined below,  who presents to clinic for regular followup on blood pressure.  She has had right knee surgery May 2012. She feels little stiff in her right knee. Regarding her HTN, she is on 6 medications. Because he lost job and insurance recently, she could not afford for clonidine patch any more. She stopped using clonidine patch. She reports that she has been taking her medications regularly. But she admitted that she may have missed some doses. She reports that she has some stress after lost her job. Her sleep is poor now.  Denies fever, chills, fatigue, headaches,  cough, chest pain, SOB,  abdominal pain,diarrhea, constipation, dysuria, urgency, frequency, hematuria, joint pain or leg swelling.   Past Medical History  Diagnosis Date  . Hypertension   . Hyperlipidemia   . Diabetes mellitus   . 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 Albertine Patricia  . Sleep apnea 2007     status post polysomnogram 2007 , suggested use of CPAP  . Ventricular hypertrophy 2006     a 2-D echo in 2006, ejection fraction 55%, mild tricuspid regurgitation noted as well, 2-D echo January 19, [id number] showed diastolic dysfunction with LVEF normal of 65%   Current Outpatient Prescriptions  Medication Sig Dispense Refill  . amLODipine (NORVASC) 10 MG tablet Take 1 tablet (10 mg total) by mouth daily.  90 tablet  2  . aspirin 81 MG EC tablet Take 81 mg by mouth daily.        . benazepril (LOTENSIN) 40 MG tablet Take 1 tablet (40 mg total) by mouth daily.  90 tablet  2  . carvedilol (COREG) 25 MG tablet Take 1 tablet (25 mg total) by mouth 2 (two) times  daily.  180 tablet  2  . furosemide (LASIX) 40 MG tablet Take 1 tablet (40 mg total) by mouth 2 (two) times daily.  180 tablet  2  . insulin glargine (LANTUS) 100 UNIT/ML injection Inject 15 Units into the skin at bedtime.  10 mL  2  . metFORMIN (GLUCOPHAGE) 1000 MG tablet Take 1 tablet (1,000 mg total) by mouth 2 (two) times daily.  180 tablet  2  . pravastatin (PRAVACHOL) 40 MG tablet Take 2 tablets (80 mg total) by mouth daily.  180 tablet  2  . traMADol (ULTRAM) 50 MG tablet TAKE TWO TABLETS BY MOUTH THREE TIMES DAILY AS NEEDED  90 tablet  2  . gabapentin (NEURONTIN) 100 MG capsule Take 100 mg by mouth 3 (three) times daily.        Marland Kitchen zolpidem (AMBIEN CR) 12.5 MG CR tablet Take 1 tablet (12.5 mg total) by mouth at bedtime as needed for sleep.  30 tablet  1   Family History  Problem Relation Age of Onset  . Diabetes Mother   . Diabetes Father   . Stroke Neg Hx    History   Social History  . Marital Status: Married    Spouse Name: N/A    Number of Children: N/A  . Years of Education: N/A   Social History Main Topics  . Smoking status: Former  Smoker -- 0.3 packs/day for 25 years    Quit date: 10/26/2005  . Smokeless tobacco: Never Used  . Alcohol Use: Yes     occasional  . Drug Use: No  . Sexually Active: Not Currently   Other Topics Concern  . None   Social History Narrative   Lives with husband and two sons. 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   Review of Systems:  General: no fevers, chills, no changes in body weight, no changes in appetite Skin: no rash HEENT: no blurry vision, hearing changes or sore throat Pulm: no dyspnea, coughing, wheezing CV: no chest pain, palpitations, shortness of breath Abd: no nausea/vomiting, abdominal pain, diarrhea/constipation GU: no dysuria, hematuria, polyuria Ext: mild tender over right knee and stiff in right knee.  Neuro: no weakness, numbness, or tingling   Objective:  Physical  Exam: Filed Vitals:   12/01/11 1335  BP: 170/114  Pulse: 86  Temp: 97.1 F (36.2 C)  TempSrc: Oral  Height: 5\' 3"  (1.6 m)  Weight: 221 lb 11.2 oz (100.562 kg)  SpO2: 100%   General: not in acute distress HEENT: PERRL, EOMI, no scleral icterus Cardiac: S1/S2, RRR, No murmurs, gallops or rubs Pulm: Good air movement bilaterally, Clear to auscultation bilaterally, No rales, wheezing, rhonchi or rubs. Abd: Soft,  nondistended, nontender, no rebound pain, no organomegaly, BS present Ext: No rashes or edema, 2+DP/PT pulse bilaterally. mild tender over right knee and stiff in right knee.  Neuro: alert and oriented X3, cranial nerves II-XII grossly intact, muscle strength 5/5 in all extremeties,  sensation to light touch intact.   Assessment & Plan:   #. HTN: patient's bp is 170/114 today in clinic. She is on 6 medications now. Detailed questioning revealed that she may not taken all of these medications and she missed some dose of these medications.  It is hard for her to remember taking 6 HTN medications each day. Dr. Stann Mainland suggests to take off hydralazine and clonidine and keep other 4 medications from now. One month later, depending her response to these 4 medications, we will do further adjustment for her. Will check her BMET today.  # insomnia: most likely due to stress from unemployment. Will give Ambien and follow up.  # HLD: her LDL was 115 on 06-09-11. She did not notice any side effects of pravastatin, such as muscle pain. Her AST was normal and ALT was slightly elevated (45) on 8/10/29/10.  Will continue with current regimen and follow up.  Ivor Costa

## 2011-12-02 NOTE — Progress Notes (Signed)
agree

## 2011-12-07 ENCOUNTER — Encounter: Payer: Self-pay | Admitting: Internal Medicine

## 2011-12-08 ENCOUNTER — Encounter (HOSPITAL_COMMUNITY): Payer: Self-pay | Admitting: Dentistry

## 2011-12-08 ENCOUNTER — Ambulatory Visit (HOSPITAL_COMMUNITY): Payer: Self-pay | Admitting: Dentistry

## 2011-12-08 DIAGNOSIS — K036 Deposits [accretions] on teeth: Secondary | ICD-10-CM

## 2011-12-08 DIAGNOSIS — K0530 Chronic periodontitis, unspecified: Secondary | ICD-10-CM

## 2011-12-09 ENCOUNTER — Ambulatory Visit (HOSPITAL_COMMUNITY): Payer: Self-pay | Admitting: Dentistry

## 2011-12-09 DIAGNOSIS — K036 Deposits [accretions] on teeth: Secondary | ICD-10-CM

## 2011-12-09 DIAGNOSIS — K0530 Chronic periodontitis, unspecified: Secondary | ICD-10-CM

## 2011-12-09 NOTE — Progress Notes (Signed)
December 08, 2011  BP 165/97  Pulse 70  Temp 97.2 F (36.2 C)  LMP 01/05/2009  Kristy [name] presents for periodic oral exam and cleaning. Patient forgot premedication. Patient will be reappointed for cleaning at later date. Health History reviewed. Premedication: Patient forgot amoxicillin 2.0 grams one hour before appt. as premed for Right Total Knee Replacement. New Rx called into [facility] with 2 refills. Amoxicillin 500mg  #4 Refill X 2 Take four capsules one hour before dental appointment. Medical Hx Update: Pt. saw Dr. Stann Mainland at [facility] at [facility] recently. Discontinued hydralazine and gave Rx for Ambien. Patient unable to afford this however. Allergies/ADR's: None known Medications: 1. Lantus 15 units HS 2. Metformin 1000 mg twice daily 3. Furosmide 40 mg BID 4. Pravastatin 40mg  two tabs daily 5. Carvedilol 25mg  BID 6. Tramadol 100 mg Q 8 H PRN (increased from 50mg ) 7. Benazepril 40mg  daily 9. Amlodipine 10mg  daily 10. Ambien 12.5mg  at bedtime prn insomnia. 11. ASA 81mg  daily  Patient to return for dental cleaning with premed tomorrow. Patient reminded to take blood pressure medication as prescibed.   Dr. Enrique Sack

## 2011-12-09 NOTE — Progress Notes (Signed)
Wednesday, December 09, 2011  BP: 189/108          P: 88           T: 97.0 BP: 169/103   P: 69 Patient forgot to take BP medication again. Patient indicates that  she feels fine and wants to proceed with cleaning today. Kristy [name] presents for dental cleaning and exam. Procedure: 1. Dental exam 2. Adult prophylaxis  DENTAL EXAM General:  Pt is a well-developed, well-nourished female in no acute distress . Vitals: As above. Extraoral Exam: No lymphadenopathy.  No TMJ symptoms. Intraoral Exam:  Normal Saliva. No soft tissue lesions. Coloration of tongue c/w racial characterization. Dentition: As before. Edentulous upper and remaining #21,22----27,28,29. Marland Kitchen Periodontal: Pockets less than 27mm. Some plaque noted. OHI stressed. Caries: None. Endodontic: No acute pulpitis symptoms. Crown and bridge : None. Prosth: For denture and lower partial denture are stable and retentive. Less than ideal occlusion but is acceptable with bilateral posterior crossbites.  Occlusion: As above.  Patient. likes to protrude lower jaw to edge to edge position. Pt. encouraged not to bite on mandibular anterior teeth to keep off of maxillary anteriors  Adult Prophy: KaVo sonic scaler to clean lower teeth. Hand curettes to refine removal. [place] floss. Fluoride for 4 minutes topically. Oral hygiene instructions provided. Patient dismissed in stable condition. RTC for recall in 6 months with premed. Dr. Enrique Sack

## 2011-12-31 ENCOUNTER — Encounter: Payer: Self-pay | Admitting: Internal Medicine

## 2012-02-17 ENCOUNTER — Other Ambulatory Visit: Payer: Self-pay | Admitting: Internal Medicine

## 2012-02-21 ENCOUNTER — Other Ambulatory Visit: Payer: Self-pay | Admitting: Internal Medicine

## 2012-03-31 ENCOUNTER — Other Ambulatory Visit: Payer: Self-pay | Admitting: Internal Medicine

## 2012-03-31 NOTE — Telephone Encounter (Signed)
Flaf sent to front desk pool for appt per Dr Lynnae January.

## 2012-03-31 NOTE — Telephone Encounter (Signed)
Pt seen Feb and asked to F/U one month. Pls make appt next two months - [facility] or [facility]

## 2012-04-05 ENCOUNTER — Other Ambulatory Visit: Payer: Self-pay | Admitting: *Deleted

## 2012-04-05 MED ORDER — TRAMADOL HCL 50 MG PO TABS: 100.0000 mg | ORAL_TABLET | Freq: Three times a day (TID) | ORAL | Status: DC | PRN

## 2012-04-26 ENCOUNTER — Ambulatory Visit (INDEPENDENT_AMBULATORY_CARE_PROVIDER_SITE_OTHER): Payer: Self-pay | Admitting: Internal Medicine

## 2012-04-26 ENCOUNTER — Encounter: Payer: Self-pay | Admitting: Internal Medicine

## 2012-04-26 VITALS — BP 166/101 | HR 89 | Temp 97.5°F | Wt 228.9 lb

## 2012-04-26 DIAGNOSIS — E119 Type 2 diabetes mellitus without complications: Secondary | ICD-10-CM

## 2012-04-26 DIAGNOSIS — E785 Hyperlipidemia, unspecified: Secondary | ICD-10-CM

## 2012-04-26 DIAGNOSIS — Z79899 Other long term (current) drug therapy: Secondary | ICD-10-CM

## 2012-04-26 DIAGNOSIS — I10 Essential (primary) hypertension: Secondary | ICD-10-CM

## 2012-04-26 LAB — LIPID PANEL
Cholesterol: 188 mg/dL (ref 0–200)
LDL Cholesterol: 103 mg/dL — ABNORMAL HIGH (ref 0–99)
Triglycerides: 202 mg/dL — ABNORMAL HIGH (ref <150)

## 2012-04-26 LAB — POCT GLYCOSYLATED HEMOGLOBIN (HGB A1C): Hemoglobin A1C: 6.8

## 2012-04-26 LAB — BASIC METABOLIC PANEL WITH GFR
BUN: 21 mg/dL (ref 6–23)
CO2: 26 mEq/L (ref 19–32)
Chloride: 102 mEq/L (ref 96–112)
Creat: 1.19 mg/dL — ABNORMAL HIGH (ref 0.50–1.10)

## 2012-04-26 LAB — GLUCOSE, CAPILLARY: Glucose-Capillary: 132 mg/dL — ABNORMAL HIGH (ref 70–99)

## 2012-04-26 MED ORDER — INSULIN NPH (HUMAN) (ISOPHANE) 100 UNIT/ML ~~LOC~~ SUSP: SUBCUTANEOUS | Status: DC

## 2012-04-26 MED ORDER — FUROSEMIDE 80 MG PO TABS: 80.0000 mg | ORAL_TABLET | Freq: Two times a day (BID) | ORAL | Status: DC

## 2012-04-26 NOTE — Assessment & Plan Note (Addendum)
Good control. Hemoglobin A1c today 6.8. Patient has lost her job and lost her insurance therefore the cost of the medication is a huge issue for patient. She's not able to exercise a lot because of her right knee pain. She states that she will go back to Dr. Philipp Ovens for revision of her right knee. I reviewed her meter which shows CBGs between 100 to 170s.  Patient does check her blood sugar at least 2 times daily . Feet exam: Within normal limit, bilateral pedal pulses were 2+, no ulcers noted.  Patient reports occasional numbness or tingling in her right toes -Will continue metformin 1000 mg by mouth twice a day -Will stop Lantus but she run out of this medication given the high cost -Will start Novolin N 15 units at bedtime, and will continue to titrate accordingly -She will need to followup with her ophthalmologist for yearly eye exam -Will continue ACE inhibitor and statin -Will repeat hemoglobin A1c in 3 months

## 2012-04-26 NOTE — Assessment & Plan Note (Signed)
Likely secondary to renal artery stenosis/fibromuscular dysplasia s/p angioplasty. Her blood pressure is still elevated at 160/100 despite reporting medication compliance with her 8:00, ACE inhibitor, Lasix, amlodipine. -Given her history of diastolic heart failure and the increase in her lower extremity edema, no increase her Lasix from 80 mg daily to twice a day. -Will check a BMP today and also repeat BMP on Monday, 05/02/2012 -Advised patient to weigh herself on home as well as keeping a diary of her weight  -If patient blood pressure continues to be elevated, will consider adding spironolactone.

## 2012-04-26 NOTE — Assessment & Plan Note (Signed)
LDL in August 2012 was 115.  She is currently on Pravachol 40 mg each bedtime. -Will repeat lipid panel today -Will consider switching to Lipitor if her LDL is still elevated.  Crestor is another option however patient does not have insurance at this time.

## 2012-04-26 NOTE — Progress Notes (Signed)
HPI: Kristy [name] is a 56 year old woman with past medical history of diabetes, uncontrolled secondary hypertension presents today for routine followup.  She is doing well in general except for her left knee pain.  She states that Dr. Ninfa Linden -performed surgery on her left knee in 02/2011-but she still has tightness and cannot bend her knee. She reports medication compliance.  She reports that her CBGs range from 90-150's.  Fasting Blood sugar usually range in 100's.  Lantus 15 units at bedtime.  Metformin 1000mg  bid.  No hypoglycemia episodes. No other complaints today   ROS: as per HPI   PE: General: alert, well-developed, and cooperative to examination.   Neck: supple, full ROM, no thyromegaly, no JVD, and no carotid bruits.  Lungs: normal respiratory effort, no accessory muscle use, normal breath sounds, no crackles, and no wheezes. Heart: normal rate, regular rhythm, no murmur, no gallop, and no rub.  Abdomen: Obese, soft, non-tender, normal bowel sounds, no distention, no guarding, no rebound tenderness, no hepatomegaly, and no splenomegaly.  Msk: Right knee with healed surgical scar without any increase in warmth or swelling or erythema.  Pulses: 2+ DP/PT pulses bilaterally, no ulcers noted on lower extremities  Extremities: No cyanosis, clubbing, +2 pitting edema on lower extremity  Neurologic: alert & oriented X3, cranial nerves II-XII intact, strength normal in all extremities, sensation intact to light touch, and gait normal.  Skin: turgor normal and no rashes.  Psych: Oriented X3, memory intact for recent and remote, normally interactive, good eye contact, not anxious appearing, and not depressed appearing.

## 2012-04-26 NOTE — Patient Instructions (Addendum)
Stop Lantus when you run out of this medication Then start taking Novolin 15 units at bedtime  Increase your Lasix to 80mg  twice daily (if your blood results are normal) Continue diet and exercise Please get lab work on 05/02/12 for lab works Please make sure you follow up with opthalmology Please schedule Pap smear for next office visit Follow up with Dr. Silverio Decamp in 6 weeks

## 2012-05-02 ENCOUNTER — Other Ambulatory Visit (INDEPENDENT_AMBULATORY_CARE_PROVIDER_SITE_OTHER): Payer: Self-pay

## 2012-05-02 DIAGNOSIS — I10 Essential (primary) hypertension: Secondary | ICD-10-CM

## 2012-05-03 ENCOUNTER — Other Ambulatory Visit: Payer: Self-pay | Admitting: Internal Medicine

## 2012-05-03 DIAGNOSIS — I10 Essential (primary) hypertension: Secondary | ICD-10-CM

## 2012-05-03 DIAGNOSIS — N179 Acute kidney failure, unspecified: Secondary | ICD-10-CM

## 2012-05-03 LAB — BASIC METABOLIC PANEL
CO2: 22 mEq/L (ref 19–32)
Calcium: 9.5 mg/dL (ref 8.4–10.5)
Creat: 1.43 mg/dL — ABNORMAL HIGH (ref 0.50–1.10)
Glucose, Bld: 203 mg/dL — ABNORMAL HIGH (ref 70–99)
Potassium: 4 mEq/L (ref 3.5–5.3)
Sodium: 140 mEq/L (ref 135–145)

## 2012-05-03 MED ORDER — FUROSEMIDE 80 MG PO TABS: ORAL_TABLET | ORAL | Status: DC

## 2012-05-04 ENCOUNTER — Other Ambulatory Visit: Payer: Self-pay | Admitting: Internal Medicine

## 2012-05-06 ENCOUNTER — Other Ambulatory Visit (INDEPENDENT_AMBULATORY_CARE_PROVIDER_SITE_OTHER): Payer: Self-pay

## 2012-05-06 DIAGNOSIS — N179 Acute kidney failure, unspecified: Secondary | ICD-10-CM

## 2012-05-06 LAB — BASIC METABOLIC PANEL WITH GFR
CO2: 29 mEq/L (ref 19–32)
Calcium: 9.8 mg/dL (ref 8.4–10.5)
Creat: 1.2 mg/dL — ABNORMAL HIGH (ref 0.50–1.10)
GFR, Est Non African American: 51 mL/min — ABNORMAL LOW
Glucose, Bld: 157 mg/dL — ABNORMAL HIGH (ref 70–99)

## 2012-06-07 ENCOUNTER — Encounter (HOSPITAL_COMMUNITY): Payer: Self-pay | Admitting: Dentistry

## 2012-06-07 ENCOUNTER — Ambulatory Visit (HOSPITAL_COMMUNITY): Payer: Self-pay | Admitting: Dentistry

## 2012-06-07 VITALS — BP 156/90 | HR 89 | Temp 98.0°F

## 2012-06-07 DIAGNOSIS — K036 Deposits [accretions] on teeth: Secondary | ICD-10-CM

## 2012-06-07 DIAGNOSIS — K0530 Chronic periodontitis, unspecified: Secondary | ICD-10-CM

## 2012-06-07 NOTE — Progress Notes (Signed)
06/07/2012  Patient:            Kristy [name] Date of Birth:  1956/05/17 MRN:                [id number]  BP 156/90  Pulse 89  Temp 59 F (36.7 C) (Oral)  LMP 01/05/2009  Past Medical History  Diagnosis Date  . Hypertension   . Hyperlipidemia   . Diabetes mellitus   . 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 Albertine Patricia  . Sleep apnea 2007     status post polysomnogram 2007 , suggested use of CPAP  . Ventricular hypertrophy 2006     a 2-D echo in 2006, ejection fraction 55%, mild tricuspid regurgitation noted as well, 2-D echo January 19, [id number] showed diastolic dysfunction with LVEF normal of 65%  No Known Allergies  Current Outpatient Prescriptions  Medication Sig Dispense Refill  . amLODipine (NORVASC) 10 MG tablet TAKE ONE TABLET BY MOUTH EVERY DAY  30 tablet  1  . aspirin 81 MG EC tablet Take 81 mg by mouth daily.        . benazepril (LOTENSIN) 40 MG tablet TAKE ONE TABLET BY MOUTH EVERY DAY  30 tablet  1  . carvedilol (COREG) 25 MG tablet TAKE ONE TABLET BY MOUTH TWICE DAILY  60 tablet  3  . furosemide (LASIX) 80 MG tablet Take 1.5 tablets daily  60 tablet  3  . insulin NPH (HUMULIN N,NOVOLIN N) 100 UNIT/ML injection Inject 15 units at bedtime  1 vial  12  . metFORMIN (GLUCOPHAGE) 1000 MG tablet TAKE ONE TABLET BY MOUTH TWICE DAILY  60 tablet  1  . pravastatin (PRAVACHOL) 40 MG tablet TAKE TWO TABLETS BY MOUTH EVERY DAY  60 tablet  1  . traMADol (ULTRAM) 50 MG tablet Take 2 tablets (100 mg total) by mouth every 8 (eight) hours as needed for pain.  90 tablet  5  . gabapentin (NEURONTIN) 100 MG capsule Take 100 mg by mouth 3 (three) times daily.        Marland Kitchen zolpidem (AMBIEN CR) 12.5 MG CR tablet Take 1 tablet (12.5 mg total) by mouth at bedtime as needed for sleep.  30 tablet  1    Keamber Lantzy presents for periodic oral examination and dental cleaning. Patient took her amoxicillin 2.0 g one hour prior to invasive dental procedures.  Procedures: 1. Dental exam 2. Adult prophylaxis  DENTAL EXAM General:  Patient is a well-developed, well-nourished female in no acute distress . Vitals: As above. Extraoral Exam: No lymphadenopathy.  No TMJ symptoms. Intraoral Exam:  Normal Saliva. No soft tissue lesions. Coloration of tongue c/w racial characterization. Dentition: As before. Edentulous upper and remaining #21-29. Periodontal: Pockets less than 55mm. Some plaque noted. Oral hygiene stressed.  Caries: None. Endodontic: No acute pulpitis symptoms. Crown and bridge : None. Prosth: Upper complete denture and lower partial denture are stable and retentive. Less than ideal occlusion but is acceptable with bilateral posterior crossbites. Patient without complaints and does NOT want me "change a thing". Occlusion: As above.  Patient. likes to protrude lower jaw to edge to edge position. Pt. encouraged not to bite on mandibular anterior teeth to keep off of maxillary anteriors  Adult Prophy: KaVo sonic scaler to clean lower teeth. Hand curettes to  refine removal. [place] Floss. Oral hygiene instructions provided. Patient dismissed in stable condition. RTC for recall in 6 months with premed. Dr. Enrique Sack

## 2012-06-15 ENCOUNTER — Other Ambulatory Visit: Payer: Self-pay | Admitting: Internal Medicine

## 2012-06-20 NOTE — Telephone Encounter (Signed)
Done

## 2012-06-28 ENCOUNTER — Other Ambulatory Visit: Payer: Self-pay | Admitting: Internal Medicine

## 2012-07-12 ENCOUNTER — Encounter: Payer: Self-pay | Admitting: Internal Medicine

## 2012-07-12 ENCOUNTER — Ambulatory Visit (INDEPENDENT_AMBULATORY_CARE_PROVIDER_SITE_OTHER): Payer: Self-pay | Admitting: Internal Medicine

## 2012-07-12 ENCOUNTER — Ambulatory Visit (HOSPITAL_COMMUNITY)
Admission: RE | Admit: 2012-07-12 | Discharge: 2012-07-12 | Disposition: A | Discharge status: Home / Self Care | Payer: Self-pay | Source: Ambulatory Visit | Attending: Internal Medicine | Admitting: Internal Medicine

## 2012-07-12 VITALS — BP 174/107 | HR 77 | Temp 97.6°F | Wt 225.9 lb

## 2012-07-12 DIAGNOSIS — M7989 Other specified soft tissue disorders: Secondary | ICD-10-CM

## 2012-07-12 DIAGNOSIS — E785 Hyperlipidemia, unspecified: Secondary | ICD-10-CM

## 2012-07-12 DIAGNOSIS — I10 Essential (primary) hypertension: Secondary | ICD-10-CM

## 2012-07-12 DIAGNOSIS — Z79899 Other long term (current) drug therapy: Secondary | ICD-10-CM

## 2012-07-12 DIAGNOSIS — M79609 Pain in unspecified limb: Secondary | ICD-10-CM

## 2012-07-12 DIAGNOSIS — E119 Type 2 diabetes mellitus without complications: Secondary | ICD-10-CM

## 2012-07-12 LAB — BASIC METABOLIC PANEL WITH GFR
BUN: 17 mg/dL (ref 6–23)
Calcium: 9.6 mg/dL (ref 8.4–10.5)
Creat: 1.12 mg/dL — ABNORMAL HIGH (ref 0.50–1.10)
GFR, Est African American: 63 mL/min
Glucose, Bld: 162 mg/dL — ABNORMAL HIGH (ref 70–99)
Sodium: 141 mEq/L (ref 135–145)

## 2012-07-12 MED ORDER — AMOXICILLIN 500 MG PO CAPS: 500.0000 mg | ORAL_CAPSULE | Freq: Three times a day (TID) | ORAL | Status: AC

## 2012-07-12 MED ORDER — INSULIN NPH (HUMAN) (ISOPHANE) 100 UNIT/ML ~~LOC~~ SUSP: SUBCUTANEOUS | Status: DC

## 2012-07-12 MED ORDER — CLONIDINE HCL 0.1 MG PO TABS: 0.1000 mg | ORAL_TABLET | Freq: Two times a day (BID) | ORAL | Status: DC

## 2012-07-12 NOTE — Progress Notes (Signed)
HPI: Kristy [name] is a 56 yo W with PMH of DM II, renal artery stenosis, HLP HTN presents today for right leg swelling x 6 days in duration.  She states that her right leg was swollen and painful in the beginning and she has been elevating her legs which helped. She does have a family history of blood clot in her sister, no long car or airplane ride. She has no recent surgery but did have right knee surgery in 02/2011.  She denies any SOB or chest pain, fever, or chills.  She states that she feels more nervous whenever she is at the doctor's office.  Blood sugars have been ranging from 145-180's with fasting blood glucose in the 170's. No episodes of hypoglycemia .  ROS; as per HPI  PE: General: alert, well-developed, and cooperative to examination.  Lungs: normal respiratory effort, no accessory muscle use, normal breath sounds, no crackles, and no wheezes. Heart: normal rate, regular rhythm, no murmur, no gallop, and no rub.  Abdomen: soft, non-tender, normal bowel sounds, no distention, no guarding, no rebound tenderness Msk: no joint swelling, no joint warmth, and no redness over joints.  Pulses: 2+ DP/PT pulses bilaterally Extremities: No cyanosis, clubbing, +2 pitting edema R>>L with increased in warmth, mild erythema but drainage. ++ tender to palpation of the right calf, negative Homan's sign Neurologic: alert & oriented X3, cranial nerves II-XII intact, strength normal in all extremities, sensation intact to light touch  Psych: Oriented X3, memory intact for recent and remote, normally interactive, good eye contact, not anxious appearing, and not depressed appearing.

## 2012-07-12 NOTE — Assessment & Plan Note (Addendum)
Not controlled, her BP fluctuates between 150-180's.  She reports medication compliance.  She has renal artery stenosis s/p angioplasty.   -Continue Coreg 25mg  bid -Continue Benazepril 40mg  -Continue norvasc 10mg  -Continue Lasix 80mg  po bid -Add Clonidine 0.1mg  bid  -Repeat BP in 2 wks

## 2012-07-12 NOTE — Patient Instructions (Addendum)
Continue current meds Start Clonidine as prescribed Increase Novolin to 20 units at bedtime Take Amoxicillin 500mg  three times daily x 10 days Follow up in 2 weeks

## 2012-07-12 NOTE — Assessment & Plan Note (Addendum)
Unilateral leg swelling is concerning for DVT given her strong family history of blood clot.  She also had knee surgery in 02/2011.  Her revised Geneva score is 12 which indicates high probability (74%).        3 points of unilateral limb pain, 5 points for HR >95, and 4 for pain on palpation.   Other ddx include cellulitis. -Will check stat LE duplex : negative for DVT or baker cysts -BMP -Will start Augmentin 500mg  tid x 10 days for presumed cellulitis (avoid bactrim because of her mild AKI with GFR 58, cannot afford doxycycline, can only afford meds on $4 plan at [place]) -Will follow up in 2 weeks

## 2012-07-12 NOTE — Assessment & Plan Note (Addendum)
HbA1c today is 8.5 .   I reviewed her meter with CBGs ranging from 145-180's, no hypoglycemic episodes.    -Will increase Novolin N to 20 units at bedtime -Will continue ACEi and statin -Repeat HbA1c in 3 months

## 2012-07-12 NOTE — Assessment & Plan Note (Signed)
LDL is 103 in 04/2012. -Will continue Pravachol 40mg  qhs

## 2012-07-12 NOTE — Progress Notes (Signed)
*  PRELIMINARY RESULTS* Vascular Ultrasound Right lower extremity venous duplex has been completed.  Preliminary findings: no evidence of DVT.  Attempted to call report to [phone] and [phone]. No answer at either number and could not leave message.  Landry Mellow, RDMS, RVT  07/12/2012, 1:37 PM

## 2012-07-26 ENCOUNTER — Ambulatory Visit (INDEPENDENT_AMBULATORY_CARE_PROVIDER_SITE_OTHER): Payer: Self-pay | Admitting: Internal Medicine

## 2012-07-26 VITALS — BP 149/101 | HR 97 | Temp 98.3°F | Wt 226.0 lb

## 2012-07-26 DIAGNOSIS — M7989 Other specified soft tissue disorders: Secondary | ICD-10-CM

## 2012-07-26 DIAGNOSIS — I10 Essential (primary) hypertension: Secondary | ICD-10-CM

## 2012-07-26 DIAGNOSIS — E119 Type 2 diabetes mellitus without complications: Secondary | ICD-10-CM

## 2012-07-26 MED ORDER — HYDROCODONE-ACETAMINOPHEN 5-325 MG PO TABS: 1.0000 | ORAL_TABLET | Freq: Four times a day (QID) | ORAL | Status: DC | PRN

## 2012-07-26 MED ORDER — CLONIDINE HCL 0.2 MG PO TABS: 0.2000 mg | ORAL_TABLET | Freq: Two times a day (BID) | ORAL | Status: DC

## 2012-07-26 NOTE — Progress Notes (Signed)
HPI: Kristy [name] is a 56 yo W with PMH of DM II, renal artery stenosis s/p angioplasty, HLP HTN presents today follow up. Clonidine 0.1mg  bid was added to her BP regimen last office visit 2 weeks ago.  She was also treated with Augmentin for presumed cellulitis of the right leg. She had negative LE venous doppler for DVT.  She states that her right leg swelling almost resolved but then she was on her feet at church and shopping at [place] the whole weekend so the swelling became worst.  She did complete the 10 day course of augmentin.  She denies any fever or chills, SOB, or chest pain.  She still experiences a lot of pain in the right leg and Tramadol is not working for her and would like to try something else.  She has been trying to elevate her legs at home which helps with her leg swelling.  ROS: as per HPI   PE:  General: alert, well-developed, and cooperative to examination.  Lungs: normal respiratory effort, no accessory muscle use, normal breath sounds, no crackles, and no wheezes. Heart: normal rate, regular rhythm, no murmur, no gallop, and no rub.  Abdomen: soft, non-tender, normal bowel sounds, no distention, no guarding, no rebound tenderness Msk: no joint swelling, no joint warmth, and no redness over joints.  Pulses: 2+ DP/PT pulses bilaterally Extremities: No cyanosis, clubbing, +2 pitting edema on Right LE with increased in warmth, mild erythema but no drainage. ++ tender to palpation of the right LE but this is improved from last office visit, negative Homan's sign Neurologic: alert & oriented X3, cranial nerves II-XII intact, strength normal in all extremities, sensation intact to light touch  Psych: Oriented X3, memory intact for recent and remote, normally interactive, good eye contact, not anxious appearing, and not depressed appearing.

## 2012-07-26 NOTE — Patient Instructions (Addendum)
Increase Novolin to 23 units at bedtime Increase Clonidine to 0.2mg  twice daily Make sure you elevate your legs above your heart level which should help with the leg swelling Follow up with Dr. Silverio Decamp in 4 weeks

## 2012-07-26 NOTE — Assessment & Plan Note (Signed)
HbA1c was 8.5 in 06/2012. I reviewed her meter with CBGs ranging from 126-206, no hypoglycemic episodes. Feet exam WNL.   -Will increase Novolin N to 20 to 23 units at bedtime as her fasting blood sugars are still in the 150-170's. -Continue Metformin 1000mg  bid -Will continue ACEi and statin  -Urine microalbumin/cr ratio 38.6 in 12/2010 -She will schedule for eye exam soon -Repeat HbA1c in 3 months

## 2012-07-26 NOTE — Assessment & Plan Note (Signed)
Still not at goal repeat BP was 149/101.   -Will increase Clonidine to 0.2mg  bid -Continue Lotensin 40mg  qd, Coreg 25mg  bid, Lasix 80mg  bid, and Norvasc 10mg  qd -Repeat BP in 4 weeks

## 2012-07-26 NOTE — Assessment & Plan Note (Addendum)
Slightly improved.  She continues to have right leg tenderness and edema.  She was ruled out for DVT with negative dopler on 07/12/12.  She was also treated for presumed cellulitis with Augmentin x 10 days.  She told me that her leg swelling and pain actually almost resolved last week but then she was on her feet the whole weekend; therefore, the swelling and tightness/pain came back.  I think she probably have venous stasis but other differential could be due to lymphatic obstruction, nephrotic syndrome, or liver disease or thyroid disease.  Her previous urinalysis 02/2011 showed protein of 30. LFTs and TFT have been wnl.    She does have some diastolic dysfunction but the edema is now more unilateral.   -Leg elevation -Pain controlled with Vicodin since Tramadol does not work, she also has peripheral neuropathy as well.  I gave Vicodin 5/325mg  q4hr prn pain #60 no RF and I explained to her that this is only short term and will not give RFs. -Compressing stocking but patient cannot afford -If no improvement, may need angiogram to rule out lymphatic obstruction or repeat labs (she does not want labs today)

## 2012-08-08 ENCOUNTER — Other Ambulatory Visit: Payer: Self-pay | Admitting: Internal Medicine

## 2012-08-08 DIAGNOSIS — Z1231 Encounter for screening mammogram for malignant neoplasm of breast: Secondary | ICD-10-CM

## 2012-08-19 ENCOUNTER — Ambulatory Visit (HOSPITAL_COMMUNITY)
Admission: RE | Admit: 2012-08-19 | Discharge: 2012-08-19 | Disposition: A | Discharge status: Home / Self Care | Payer: Self-pay | Source: Ambulatory Visit | Attending: Internal Medicine | Admitting: Internal Medicine

## 2012-08-19 DIAGNOSIS — Z1231 Encounter for screening mammogram for malignant neoplasm of breast: Secondary | ICD-10-CM

## 2012-09-19 ENCOUNTER — Other Ambulatory Visit: Payer: Self-pay | Admitting: Internal Medicine

## 2012-11-02 ENCOUNTER — Other Ambulatory Visit: Payer: Self-pay | Admitting: *Deleted

## 2012-11-03 MED ORDER — TRAMADOL HCL 50 MG PO TABS: 50.0000 mg | ORAL_TABLET | Freq: Three times a day (TID) | ORAL | Status: DC | PRN

## 2012-11-07 NOTE — Telephone Encounter (Signed)
Rx called in to pharmacy. 

## 2012-12-06 ENCOUNTER — Encounter: Payer: Self-pay | Admitting: Internal Medicine

## 2012-12-06 DIAGNOSIS — E1142 Type 2 diabetes mellitus with diabetic polyneuropathy: Secondary | ICD-10-CM | POA: Insufficient documentation

## 2012-12-14 ENCOUNTER — Other Ambulatory Visit: Payer: Self-pay | Admitting: *Deleted

## 2012-12-14 MED ORDER — CARVEDILOL 25 MG PO TABS: ORAL_TABLET | ORAL | Status: DC

## 2012-12-16 ENCOUNTER — Other Ambulatory Visit: Payer: Self-pay | Admitting: Internal Medicine

## 2012-12-19 ENCOUNTER — Telehealth (HOSPITAL_COMMUNITY): Payer: Self-pay | Admitting: Dental General Practice

## 2012-12-19 NOTE — Telephone Encounter (Signed)
Spoke with Kristy [name]'s husband about her dental appointment that she missed. He said he would have her contact us. After numerous attempts to make contact no message could be left, no answering machine available. Will wait for patient to contact Dental Medicine For future appointments.  djs

## 2012-12-20 ENCOUNTER — Encounter: Payer: Self-pay | Admitting: Internal Medicine

## 2012-12-20 ENCOUNTER — Ambulatory Visit (INDEPENDENT_AMBULATORY_CARE_PROVIDER_SITE_OTHER): Payer: Self-pay | Admitting: Internal Medicine

## 2012-12-20 VITALS — BP 161/109 | HR 111 | Temp 97.2°F | Ht 63.0 in | Wt 228.5 lb

## 2012-12-20 DIAGNOSIS — E785 Hyperlipidemia, unspecified: Secondary | ICD-10-CM

## 2012-12-20 DIAGNOSIS — Z79899 Other long term (current) drug therapy: Secondary | ICD-10-CM

## 2012-12-20 DIAGNOSIS — I10 Essential (primary) hypertension: Secondary | ICD-10-CM

## 2012-12-20 DIAGNOSIS — E119 Type 2 diabetes mellitus without complications: Secondary | ICD-10-CM

## 2012-12-20 DIAGNOSIS — E1149 Type 2 diabetes mellitus with other diabetic neurological complication: Secondary | ICD-10-CM

## 2012-12-20 DIAGNOSIS — I150 Renovascular hypertension: Secondary | ICD-10-CM | POA: Insufficient documentation

## 2012-12-20 DIAGNOSIS — E1142 Type 2 diabetes mellitus with diabetic polyneuropathy: Secondary | ICD-10-CM

## 2012-12-20 LAB — POCT GLYCOSYLATED HEMOGLOBIN (HGB A1C): Hemoglobin A1C: 8.3

## 2012-12-20 LAB — BASIC METABOLIC PANEL WITH GFR
Calcium: 10.1 mg/dL (ref 8.4–10.5)
Chloride: 105 mEq/L (ref 96–112)
GFR, Est African American: 68 mL/min
GFR, Est Non African American: 59 mL/min — ABNORMAL LOW
Potassium: 4 mEq/L (ref 3.5–5.3)
Sodium: 141 mEq/L (ref 135–145)

## 2012-12-20 LAB — LIPID PANEL
Cholesterol: 182 mg/dL (ref 0–200)
HDL: 48 mg/dL (ref >39)
LDL Cholesterol: 89 mg/dL (ref 0–99)
Triglycerides: 223 mg/dL — ABNORMAL HIGH (ref <150)

## 2012-12-20 MED ORDER — METFORMIN HCL 1000 MG PO TABS: ORAL_TABLET | ORAL | Status: DC

## 2012-12-20 MED ORDER — CARVEDILOL 25 MG PO TABS: ORAL_TABLET | ORAL | Status: DC

## 2012-12-20 MED ORDER — INSULIN NPH (HUMAN) (ISOPHANE) 100 UNIT/ML ~~LOC~~ SUSP: SUBCUTANEOUS | Status: DC

## 2012-12-20 NOTE — Assessment & Plan Note (Signed)
BP Readings from Last 3 Encounters:  07/26/12 149/101  07/12/12 174/107  06/07/12 156/90    Lab Results  Component Value Date   NA 141 07/12/2012   K 3.9 07/12/2012   CREATININE 1.12* 07/12/2012    Assessment:  Blood pressure control:    Progress toward BP goal:       Plan:  Medications:  Norvasc 10 mg, benazepril 40 mg, Coreg 25 mg twice a day, clonidine 0.1 mg twice a day, Lasix 80 mg daily  Educational resources provided:    Self management tools provided:    Other plans: We'll check a BMP today to make sure that her electrolytes are within normal limit and that her creatinine is stable

## 2012-12-20 NOTE — Assessment & Plan Note (Addendum)
LDL 80 months ago was 103. She's currently on pravastatin 40 mg daily. Will continue current medication. -Will get repeat lipid panel

## 2012-12-20 NOTE — Assessment & Plan Note (Signed)
Lab Results  Component Value Date   HGBA1C 8.5 07/12/2012   HGBA1C 6.8 04/26/2012   HGBA1C 6.5 12/01/2011     Assessment:  Diabetes control:    Progress toward A1C goal:       Plan:  Medications:    Home glucose monitoring:   Frequency:  3x   Timing:   before meal  Instruction/counseling given:yes  Educational resources provided:  yes  Self management tools provided:  yes  Other plans: Ophthalmology referral,

## 2012-12-20 NOTE — Progress Notes (Signed)
Patient ID: Kristy [name], female   DOB: 28-Jun-1956, 57 y.o.   MRN: [id number] History of present illness: Mrs. Lyter is a 57 yo W with PMH of DM II, renal artery stenosis s/p angioplasty, HLP, HTN presents today follow up Doing well, no complaints. Trying to eat right, she has been trying to cut out soda. She didn't take all of her BP meds today because she didn't want it to ame her drowsy while driving especially lisinopril and Norvasc. She usually take at bedtime.   Fasting Blood sugar 160's.  Ranging 160-200's. No episodes of hypoglycemia.  She checks her blood sugar up to 3 times a day. She did bring her meter today however we are unable to download the meter. She takes Novolin and 25 units at bedtime. In addition she also takes metformin 1000 mg by mouth twice a day. Eye exam: 2 yrs ago. She would like Pap smear next office visit.  Review of system: As per history of present illness  Physical examination: General: alert, well-developed, and cooperative to examination.  Lungs: normal respiratory effort, no accessory muscle use, normal breath sounds, no crackles, and no wheezes. Heart: normal rate, regular rhythm, no murmur, no gallop, and no rub.  Abdomen: soft, non-tender, normal bowel sounds, no distention, no guarding, no rebound tenderness Neurologic: nonfocal Skin: turgor normal and +1 pitting edema right greater than left. Right lower extremity showed venous stasis changes. No erythema noted  Psych: appropriate

## 2012-12-20 NOTE — Patient Instructions (Signed)
General Instructions: Return to clinic in one month for Pap smear and blood pressure recheck Also need to return to clinic in 3 months for routine followup Increase her insulin to 27-30 units at bedtime If your fasting blood sugar in the morning (before you eat) is not between 70-130, you can increase 2 to 5 units every 5 days.  If it is below 70, I want you to decrease the Novolin by 2 to 5 units as well.  Our goal is to keep your blood sugar between 70-130 when you first wake up in the morning fasting.   When your blood sugar is less than 70 or you start feeling shaky, tremor, or sweating, you can drink 4-8 ounces of apple or orange juice, eat crackers, or take a glucose tablet which you can buy over the counter Remember to check your blood sugar when you get up in the morning and also 2-3 times per day Please make sure to take your blood pressure medication in the morning as we discussed We will get labs today and I will call you with any abnormal lab results   Self Care Goals & Plans:  Self Care Goal 12/20/2012  Manage my medications take my medicines as prescribed; bring my medications to every visit; refill my medications on time; follow the sick day instructions if I am sick  Monitor my health keep track of my blood glucose; bring my glucose meter and log to each visit; keep track of my weight; check my feet daily  Eat healthy foods eat more vegetables; eat fruit for snacks and desserts; eat smaller portions; eat foods that are low in salt; eat baked foods instead of fried foods; drink diet soda or water instead of juice or soda  Be physically active take a walk every day; find an activity I enjoy

## 2013-01-17 ENCOUNTER — Encounter: Payer: Self-pay | Admitting: Internal Medicine

## 2013-02-21 ENCOUNTER — Other Ambulatory Visit: Payer: Self-pay | Admitting: *Deleted

## 2013-02-21 DIAGNOSIS — I10 Essential (primary) hypertension: Secondary | ICD-10-CM

## 2013-02-22 MED ORDER — FUROSEMIDE 80 MG PO TABS: ORAL_TABLET | ORAL | Status: DC

## 2013-02-22 NOTE — Telephone Encounter (Signed)
Rx called in to pharmacy. 

## 2013-02-27 ENCOUNTER — Other Ambulatory Visit: Payer: Self-pay | Admitting: *Deleted

## 2013-02-27 MED ORDER — BENAZEPRIL HCL 40 MG PO TABS: ORAL_TABLET | ORAL | Status: DC

## 2013-02-27 NOTE — Telephone Encounter (Signed)
Pt is out of this med.  Please refill now she can go to pharmacy and pick it up

## 2013-03-14 ENCOUNTER — Encounter: Payer: Self-pay | Admitting: Internal Medicine

## 2013-03-14 ENCOUNTER — Ambulatory Visit (INDEPENDENT_AMBULATORY_CARE_PROVIDER_SITE_OTHER): Payer: Self-pay | Admitting: Internal Medicine

## 2013-03-14 VITALS — BP 148/93 | HR 93 | Temp 98.2°F | Ht 62.75 in | Wt 228.0 lb

## 2013-03-14 DIAGNOSIS — E1149 Type 2 diabetes mellitus with other diabetic neurological complication: Secondary | ICD-10-CM

## 2013-03-14 DIAGNOSIS — M25569 Pain in unspecified knee: Secondary | ICD-10-CM

## 2013-03-14 DIAGNOSIS — E119 Type 2 diabetes mellitus without complications: Secondary | ICD-10-CM

## 2013-03-14 DIAGNOSIS — I10 Essential (primary) hypertension: Secondary | ICD-10-CM

## 2013-03-14 DIAGNOSIS — M25561 Pain in right knee: Secondary | ICD-10-CM

## 2013-03-14 DIAGNOSIS — G8929 Other chronic pain: Secondary | ICD-10-CM

## 2013-03-14 DIAGNOSIS — F1190 Opioid use, unspecified, uncomplicated: Secondary | ICD-10-CM | POA: Insufficient documentation

## 2013-03-14 DIAGNOSIS — E785 Hyperlipidemia, unspecified: Secondary | ICD-10-CM

## 2013-03-14 MED ORDER — CLONIDINE HCL 0.3 MG PO TABS
0.3000 mg | ORAL_TABLET | Freq: Two times a day (BID) | ORAL | Status: DC
Start: 2013-03-14 — End: 2013-08-17

## 2013-03-14 MED ORDER — TRAMADOL HCL 50 MG PO TABS: 50.0000 mg | ORAL_TABLET | Freq: Three times a day (TID) | ORAL | Status: DC | PRN

## 2013-03-14 MED ORDER — INSULIN NPH (HUMAN) (ISOPHANE) 100 UNIT/ML ~~LOC~~ SUSP: SUBCUTANEOUS | Status: DC

## 2013-03-14 MED ORDER — CARVEDILOL 25 MG PO TABS: ORAL_TABLET | ORAL | Status: DC

## 2013-03-14 MED ORDER — PRAVASTATIN SODIUM 40 MG PO TABS: ORAL_TABLET | ORAL | Status: DC

## 2013-03-14 MED ORDER — CLONIDINE HCL 0.3 MG PO TABS: 0.3000 mg | ORAL_TABLET | Freq: Two times a day (BID) | ORAL | Status: DC

## 2013-03-14 NOTE — Assessment & Plan Note (Signed)
Lab Results  Component Value Date   HGBA1C 8.5 03/14/2013   HGBA1C 8.3 12/20/2012   HGBA1C 8.5 07/12/2012     Assessment: Diabetes control:   not well-controlled. Her A1c has been uncontrolled since September of 2013 because she was switched from Lantus to Novolin due to insurance issue. She's currently taking 30 units of Novolin N at bedtime and metformin 1000 mg twice a day. She does not wish to be on another by mouth medications. She denies any episodes of hypoglycemia. I was unable to read her meter since we were unable to download. However per her report her fasting blood sugar runs in the 170s to 180s. Progress toward A1C goal:    no  Plan: Medications:  Will increase Novolin N from 30 units to 33-35 units at bedtime. Patient was informed to decrease her insulin if her blood sugar falls below 70 and for her to take sugar tablet, eat crackers or juice if she has symptoms of hypoglycemia. Home glucose monitoring: Frequency:  3 Timing:  before meals Instruction/counseling given:yes Educational resources provided: brochure Self management tools provided: copy of home glucose meter download;home glucose logbook Other plans: Continue diet and exercise Her urine microalbumin creatinine ratio was 38 about 2 years ago. She's currently on ACE inhibitor. Will not  repeat urine microalbumin creatinine ratio at this time given that patient does not have insurance. Feet exam unremarkable with +2 DP pulses bilaterally, no ulcer noted.

## 2013-03-14 NOTE — Progress Notes (Signed)
Patient ID: Kristy [name], female   DOB: Jul 11, 1956, 57 y.o.   MRN: [id number] HPI: Kristy [name] is a 57 yo woman with PMH of DM, HTN, HLP presents today for routine follow up and medication refills. She is feeling well overall.  DM, ranging 130-180's. No hypoglycemia.  She didt bring her glucose meter however we were unable to download it. She takes Novolin 30 units qhs, Metformin 1000mg  bid. No numbness or tingling. She had eye exam last month and had normal report.  .   She states that when she was on Lantus, her blood sugar was much better controlled with hemoglobin A1c less than 7. Now she has to use Novolin at [facility] and her blood sugar has been fluctuating. She does not have insurance.  Patient has not had a Pap smear but declined to have it done today because she would like to get on the list for Free Pap smear. She understands the importance of having Pap smear as well as other health maintenance exams. No other complaints today. Patient is also trying to lose weight by exercising and diet. She states she tried to eat baked food as well as vegetable.  ROS: as per HPI  PE: General: alert, well-developed, and cooperative to examination.  Lungs: normal respiratory effort, no accessory muscle use, normal breath sounds, no crackles, and no wheezes. Heart: normal rate, regular rhythm, no murmur, no gallop, and no rub.  Abdomen: Obese, soft, non-tender, normal bowel sounds, no distention, no guarding, no rebound tenderness Neurologic: nonfocal Skin: turgor normal and no rashes.  Extremities: +2 pitting edema bilaterally up to knee . +2 DP pulses bilaterally  Psych: appropriate

## 2013-03-14 NOTE — Patient Instructions (Addendum)
Increase Novolin to 33-35 units  Increase Clonidine to 0.3mg  twice daily Follow up in 3 months

## 2013-03-14 NOTE — Assessment & Plan Note (Signed)
Well-controlled. LDL in February of 2014 was 89. Will continue pravastatin 40 mg by mouth each bedtime

## 2013-03-14 NOTE — Assessment & Plan Note (Signed)
BP Readings from Last 3 Encounters:  03/14/13 148/93  12/20/12 161/109  07/26/12 149/101    Lab Results  Component Value Date   NA 141 12/20/2012   K 4.0 12/20/2012   CREATININE 1.06 12/20/2012    Assessment: Blood pressure control:   adequately controlled. She states that she does take a blood pressure a home and has a diary but forgets to take it to the office today. Her blood pressure usually runs in the high 140s over 90s. Progress toward BP goal:    yes  Plan: Medications: We'll increase clonidine from 0.2-0.3 mg by mouth twice a day, continue Coreg 25 twice a day, benazepril 40 mg daily, Norvasc 10 mg daily. Educational resources provided: brochure;video Self management tools provided: home blood pressure logbook Other plans: Followup in 3 months

## 2013-03-14 NOTE — Assessment & Plan Note (Signed)
Refilled Tramadol today

## 2013-03-20 NOTE — Progress Notes (Signed)
INTERNAL MEDICINE TEACHING ATTENDING ADDENDUM: I discussed this case with Dr. Silverio Decamp soon after the patient visit. I have read the documentation and I agree with the plan of care. Please see the resident note for details of management.

## 2013-03-21 ENCOUNTER — Encounter: Payer: Self-pay | Admitting: Internal Medicine

## 2013-05-04 ENCOUNTER — Other Ambulatory Visit: Payer: Self-pay

## 2013-05-18 ENCOUNTER — Encounter: Payer: Self-pay | Admitting: Internal Medicine

## 2013-07-06 ENCOUNTER — Encounter: Payer: Self-pay | Admitting: Internal Medicine

## 2013-07-12 ENCOUNTER — Other Ambulatory Visit: Payer: Self-pay | Admitting: *Deleted

## 2013-07-12 MED ORDER — AMLODIPINE BESYLATE 10 MG PO TABS: 10.0000 mg | ORAL_TABLET | Freq: Every day | ORAL | Status: DC

## 2013-07-13 ENCOUNTER — Other Ambulatory Visit: Payer: Self-pay | Admitting: Internal Medicine

## 2013-07-13 ENCOUNTER — Other Ambulatory Visit: Payer: Self-pay | Admitting: *Deleted

## 2013-07-13 DIAGNOSIS — G8929 Other chronic pain: Secondary | ICD-10-CM

## 2013-07-13 NOTE — Telephone Encounter (Signed)
[facility] - since Tramadol is controlled substance now, they need a new rx. Thanks

## 2013-07-14 MED ORDER — TRAMADOL HCL 50 MG PO TABS: 50.0000 mg | ORAL_TABLET | Freq: Three times a day (TID) | ORAL | Status: DC | PRN

## 2013-07-17 ENCOUNTER — Other Ambulatory Visit: Payer: Self-pay | Admitting: Internal Medicine

## 2013-07-17 DIAGNOSIS — G8929 Other chronic pain: Secondary | ICD-10-CM

## 2013-07-17 MED ORDER — TRAMADOL HCL 50 MG PO TABS: 50.0000 mg | ORAL_TABLET | Freq: Three times a day (TID) | ORAL | Status: DC | PRN

## 2013-07-17 NOTE — Progress Notes (Signed)
Tramadol rx faxed to [facility] - pt informed.

## 2013-08-17 ENCOUNTER — Encounter: Payer: Self-pay | Admitting: Internal Medicine

## 2013-08-17 ENCOUNTER — Ambulatory Visit (INDEPENDENT_AMBULATORY_CARE_PROVIDER_SITE_OTHER): Payer: Medicare Other | Admitting: Internal Medicine

## 2013-08-17 VITALS — BP 151/96 | HR 76 | Temp 97.3°F | Ht 62.0 in | Wt 218.9 lb

## 2013-08-17 DIAGNOSIS — Z23 Encounter for immunization: Secondary | ICD-10-CM

## 2013-08-17 DIAGNOSIS — E1149 Type 2 diabetes mellitus with other diabetic neurological complication: Secondary | ICD-10-CM

## 2013-08-17 DIAGNOSIS — M25569 Pain in unspecified knee: Secondary | ICD-10-CM

## 2013-08-17 DIAGNOSIS — I10 Essential (primary) hypertension: Secondary | ICD-10-CM

## 2013-08-17 DIAGNOSIS — Z0000 Encounter for general adult medical examination without abnormal findings: Secondary | ICD-10-CM

## 2013-08-17 DIAGNOSIS — E785 Hyperlipidemia, unspecified: Secondary | ICD-10-CM

## 2013-08-17 DIAGNOSIS — G8929 Other chronic pain: Secondary | ICD-10-CM

## 2013-08-17 MED ORDER — INSULIN PEN NEEDLE 31G X 5 MM MISC: 1.0000 | Freq: Three times a day (TID) | Status: DC

## 2013-08-17 MED ORDER — PRAVASTATIN SODIUM 80 MG PO TABS: ORAL_TABLET | ORAL | Status: DC

## 2013-08-17 MED ORDER — TRAMADOL HCL 50 MG PO TABS: 50.0000 mg | ORAL_TABLET | Freq: Three times a day (TID) | ORAL | Status: DC | PRN

## 2013-08-17 MED ORDER — METFORMIN HCL 500 MG PO TABS: ORAL_TABLET | ORAL | Status: DC

## 2013-08-17 MED ORDER — INSULIN GLARGINE 100 UNIT/ML ~~LOC~~ SOLN: 35.0000 [IU] | Freq: Every day | SUBCUTANEOUS | Status: DC

## 2013-08-17 MED ORDER — TERAZOSIN HCL 1 MG PO CAPS: 1.0000 mg | ORAL_CAPSULE | Freq: Every day | ORAL | Status: DC

## 2013-08-17 NOTE — Patient Instructions (Addendum)
General Instructions: Please follow up in 3 months Pick you your medications from the pharmacy  Read all the information below  Decrease Metformin to 500 mg 2x/day  Take Lantus 35 units at night  Check your sugar three times a day  Schedule an appt with Barry Brunner Diabetes educator  Instead of Clonadine take Terazosin 1 mg at night for blood pressure it is $4 at [facility]   Terazosin capsules What is this medicine? TERAZOSIN (ter AY zoe sin) is an antihypertensive. It works by relaxing the blood vessels. It is used to treat benign prostatic hyperplasia (BPH) in men and to treat high blood pressure in both men and women. This medicine may be used for other purposes; ask your health care provider or pharmacist if you have questions. What should I tell my health care provider before I take this medicine? They need to know if you have any of the following conditions: -an unusual or allergic reaction to terazosin, other medicines, foods, dyes, or preservatives -pregnant or trying to get pregnant -breast-feeding How should I use this medicine? Take this medicine by mouth with a glass of water. Follow the directions on the prescription label. You can take this medicine with or without food. Take your doses at regular intervals. Do not take your medicine more often than directed. Do not stop taking except on the advice of your doctor or health care professional. Talk to your pediatrician regarding the use of this medicine in children. Special care may be needed. Overdosage: If you think you have taken too much of this medicine contact a poison control center or emergency room at once. NOTE: This medicine is only for you. Do not share this medicine with others. What if I miss a dose? If you miss a dose, take it as soon as you can. If it is almost time for your next dose, take only that dose. Do not take double or extra doses. What may interact with this medicine? -diuretics -medicines for high  blood pressure -sildenafil -tadalafil -vardenafil This list may not describe all possible interactions. Give your health care provider a list of all the medicines, herbs, non-prescription drugs, or dietary supplements you use. Also tell them if you smoke, drink alcohol, or use illegal drugs. Some items may interact with your medicine. What should I watch for while using this medicine? Visit your doctor or health care professional for regular checks on your progress. Check your blood pressure regularly. Ask your doctor or health care professional what your blood pressure should be and when you should contact him or her. Drowsiness and dizziness are more likely to occur after the first dose, after an increase in dose, or during hot weather or exercise. These effects can decrease once your body adjusts to this medicine. Do not drive, use machinery, or do anything that needs mental alertness until you know how this drug affects you. Do not stand or sit up quickly, especially if you are an older patient. This reduces the risk of dizzy or fainting spells. Alcohol can make you more drowsy and dizzy. Avoid alcoholic drinks. Do not treat yourself for coughs, colds, or pain while you are taking this medicine without asking your doctor or health care professional for advice. Some ingredients may increase your blood pressure. Your mouth may get dry. Chewing sugarless gum or sucking hard candy, and drinking plenty of water may help. Contact your doctor if the problem does not go away or is severe. What side effects may I notice  from receiving this medicine? Side effects that you should report to your doctor or health care professional as soon as possible: -blurred vision -difficulty breathing, shortness of breath -fainting spells, light headedness -fast or irregular heartbeat, palpitations or chest pain -prolonged painful erection of the penis -swelling of the legs and ankles -unusually weak or tired Side  effects that usually do not require medical attention (report to your doctor or health care professional if they continue or are bothersome): -headache -nausea -nasal stuffiness This list may not describe all possible side effects. Call your doctor for medical advice about side effects. You may report side effects to FDA at 1-800-FDA-1088. Where should I keep my medicine? Keep out of the reach of children. Store at room temperature between 20 and 25 degrees C (68 and 77 degrees F). Higher temperatures may cause the capsules to soften or melt. Protect from light and moisture. Throw away any unused medicine after the expiration date. NOTE: This sheet is a summary. It may not cover all possible information. If you have questions about this medicine, talk to your doctor, pharmacist, or health care provider.  2012, [employer]vier/Gold Standard. (06/28/2008 11:22:05 AM)    Treatment Goals:  Goals (1 Years of Data) as of 08/17/13         As of Today 03/14/13 03/14/13 12/20/12 12/20/12     Blood Pressure    . Blood Pressure < 140/90  155/94 148/93 159/95 161/109 186/105     Result Component    . HEMOGLOBIN A1C < 7.0  10.8 8.5  8.3       Progress Toward Treatment Goals:  Treatment Goal 08/17/2013  Hemoglobin A1C deteriorated  Blood pressure deteriorated    Self Care Goals & Plans:  Self Care Goal 08/17/2013  Manage my medications take my medicines as prescribed; bring my medications to every visit; refill my medications on time  Monitor my health keep track of my blood glucose; bring my glucose meter and log to each visit; keep track of my blood pressure; keep track of my weight  Eat healthy foods drink diet soda or water instead of juice or soda; eat more vegetables; eat foods that are low in salt; eat baked foods instead of fried foods; eat fruit for snacks and desserts; eat smaller portions  Be physically active -  Meeting treatment goals maintain the current self-care plan    Home Blood  Glucose Monitoring 08/17/2013  Check my blood sugar 3 times a day  When to check my blood sugar before meals; at bedtime     Care Management & Community Referrals:  Referral 08/17/2013  Referrals made for care management support diabetes educator  Referrals made to community resources none       DASH Diet The DASH diet stands for "Dietary Approaches to Stop Hypertension." It is a healthy eating plan that has been shown to reduce high blood pressure (hypertension) in as little as 14 days, while also possibly providing other significant health benefits. These other health benefits include reducing the risk of breast cancer after menopause and reducing the risk of type 2 diabetes, heart disease, colon cancer, and stroke. Health benefits also include weight loss and slowing kidney failure in patients with chronic kidney disease.  DIET GUIDELINES  Limit salt (sodium). Your diet should contain less than 1500 mg of sodium daily.  Limit refined or processed carbohydrates. Your diet should include mostly whole grains. Desserts and added sugars should be used sparingly.  Include small amounts of heart-healthy fats.  These types of fats include nuts, oils, and tub margarine. Limit saturated and trans fats. These fats have been shown to be harmful in the body. CHOOSING FOODS  The following food groups are based on a 2000 calorie diet. See your Registered Dietitian for individual calorie needs. Grains and Grain Products (6 to 8 servings daily)  Eat More Often: Whole-wheat bread, brown rice, whole-grain or wheat pasta, quinoa, popcorn without added fat or salt (air popped).  Eat Less Often: White bread, white pasta, white rice, cornbread. Vegetables (4 to 5 servings daily)  Eat More Often: Fresh, frozen, and canned vegetables. Vegetables may be raw, steamed, roasted, or grilled with a minimal amount of fat.  Eat Less Often/Avoid: Creamed or fried vegetables. Vegetables in a cheese sauce. Fruit  (4 to 5 servings daily)  Eat More Often: All fresh, canned (in natural juice), or frozen fruits. Dried fruits without added sugar. One hundred percent fruit juice ( cup [237 mL] daily).  Eat Less Often: Dried fruits with added sugar. Canned fruit in light or heavy syrup. [employer], Fish, and Poultry (2 servings or less daily. One serving is 3 to 4 oz [85-114 g]).  Eat More Often: Ninety percent or leaner ground beef, tenderloin, sirloin. Round cuts of beef, chicken breast, [place] breast. All fish. Grill, bake, or broil your meat. Nothing should be fried.  Eat Less Often/Avoid: Fatty cuts of meat, [place], or chicken leg, thigh, or wing. Fried cuts of meat or fish. Dairy (2 to 3 servings)  Eat More Often: Low-fat or fat-free milk, low-fat plain or light yogurt, reduced-fat or part-skim cheese.  Eat Less Often/Avoid: Milk (whole, 2%).Whole milk yogurt. Full-fat cheeses. Nuts, Seeds, and Legumes (4 to 5 servings per week)  Eat More Often: All without added salt.  Eat Less Often/Avoid: Salted nuts and seeds, canned beans with added salt. Fats and Sweets (limited)  Eat More Often: Vegetable oils, tub margarines without trans fats, sugar-free gelatin. Mayonnaise and salad dressings.  Eat Less Often/Avoid: Coconut oils, palm oils, butter, stick margarine, cream, half and half, cookies, candy, pie. FOR MORE INFORMATION The Dash Diet Eating Plan: www.dashdiet.org Document Released: 10/01/2011 Document Revised: 01/04/2012 Document Reviewed: 10/01/2011 [facility] Patient Information 2014 [place], [place].  Hypertension As your heart beats, it forces blood through your arteries. This force is your blood pressure. If the pressure is too high, it is called hypertension (HTN) or high blood pressure. HTN is dangerous because you may have it and not know it. High blood pressure may mean that your heart has to work harder to pump blood. Your arteries may be narrow or stiff. The extra work puts you at  risk for heart disease, stroke, and other problems.  Blood pressure consists of two numbers, a higher number over a lower, 110/72, for example. It is stated as "110 over 72." The ideal is below 120 for the top number (systolic) and under 80 for the bottom (diastolic). Write down your blood pressure today. You should pay close attention to your blood pressure if you have certain conditions such as:  Heart failure.  Prior heart attack.  Diabetes  Chronic kidney disease.  Prior stroke.  Multiple risk factors for heart disease. To see if you have HTN, your blood pressure should be measured while you are seated with your arm held at the level of the heart. It should be measured at least twice. A one-time elevated blood pressure reading (especially in the Emergency Department) does not mean that you need treatment. There  may be conditions in which the blood pressure is different between your right and left arms. It is important to see your caregiver soon for a recheck. Most people have essential hypertension which means that there is not a specific cause. This type of high blood pressure may be lowered by changing lifestyle factors such as:  Stress.  Smoking.  Lack of exercise.  Excessive weight.  Drug/tobacco/alcohol use.  Eating less salt. Most people do not have symptoms from high blood pressure until it has caused damage to the body. Effective treatment can often prevent, delay or reduce that damage. TREATMENT  When a cause has been identified, treatment for high blood pressure is directed at the cause. There are a large number of medications to treat HTN. These fall into several categories, and your caregiver will help you select the medicines that are best for you. Medications may have side effects. You should review side effects with your caregiver. If your blood pressure stays high after you have made lifestyle changes or started on medicines,   Your medication(s) may need to be  changed.  Other problems may need to be addressed.  Be certain you understand your prescriptions, and know how and when to take your medicine.  Be sure to follow up with your caregiver within the time frame advised (usually within two weeks) to have your blood pressure rechecked and to review your medications.  If you are taking more than one medicine to lower your blood pressure, make sure you know how and at what times they should be taken. Taking two medicines at the same time can result in blood pressure that is too low. SEEK IMMEDIATE MEDICAL CARE IF:  You develop a severe headache, blurred or changing vision, or confusion.  You have unusual weakness or numbness, or a faint feeling.  You have severe chest or abdominal pain, vomiting, or breathing problems. MAKE SURE YOU:   Understand these instructions.  Will watch your condition.  Will get help right away if you are not doing well or get worse. Document Released: 10/12/2005 Document Revised: 01/04/2012 Document Reviewed: 06/01/2008 [facility] Patient Information 2014 [place].  Type 2 Diabetes Mellitus, Adult Type 2 diabetes mellitus, often simply referred to as type 2 diabetes, is a long-lasting (chronic) disease. In type 2 diabetes, the pancreas does not make enough insulin (a hormone), the cells are less responsive to the insulin that is made (insulin resistance), or both. Normally, insulin moves sugars from food into the tissue cells. The tissue cells use the sugars for energy. The lack of insulin or the lack of normal response to insulin causes excess sugars to build up in the blood instead of going into the tissue cells. As a result, high blood sugar (hyperglycemia) develops. The effect of high sugar (glucose) levels can cause many complications. Type 2 diabetes was also previously called adult-onset diabetes but it can occur at any age.  RISK FACTORS  A person is predisposed to developing type 2 diabetes if someone  in the family has the disease and also has one or more of the following primary risk factors:  Overweight.  An inactive lifestyle.  A history of consistently eating high-calorie foods. Maintaining a normal weight and regular physical activity can reduce the chance of developing type 2 diabetes. SYMPTOMS  A person with type 2 diabetes may not show symptoms initially. The symptoms of type 2 diabetes appear slowly. The symptoms include:  Increased thirst (polydipsia).  Increased urination (polyuria).  Increased urination during  the night (nocturia).  Weight loss. This weight loss may be rapid.  Frequent, recurring infections.  Tiredness (fatigue).  Weakness.  Vision changes, such as blurred vision.  Fruity smell to your breath.  Abdominal pain.  Nausea or vomiting.  Cuts or bruises which are slow to heal.  Tingling or numbness in the hands or feet. DIAGNOSIS Type 2 diabetes is frequently not diagnosed until complications of diabetes are present. Type 2 diabetes is diagnosed when symptoms or complications are present and when blood glucose levels are increased. Your blood glucose level may be checked by one or more of the following blood tests:  A fasting blood glucose test. You will not be allowed to eat for at least 8 hours before a blood sample is taken.  A random blood glucose test. Your blood glucose is checked at any time of the day regardless of when you ate.  A hemoglobin A1c blood glucose test. A hemoglobin A1c test provides information about blood glucose control over the previous 3 months.  An oral glucose tolerance test (OGTT). Your blood glucose is measured after you have not eaten (fasted) for 2 hours and then after you drink a glucose-containing beverage. TREATMENT   You may need to take insulin or diabetes medicine daily to keep blood glucose levels in the desired range.  You will need to match insulin dosing with exercise and healthy food choices. The  treatment goal is to maintain the before meal blood sugar (preprandial glucose) level at 70 130 mg/dL. HOME CARE INSTRUCTIONS   Have your hemoglobin A1c level checked twice a year.  Perform daily blood glucose monitoring as directed by your caregiver.  Monitor urine ketones when you are ill and as directed by your caregiver.  Take your diabetes medicine or insulin as directed by your caregiver to maintain your blood glucose levels in the desired range.  Never run out of diabetes medicine or insulin. It is needed every day.  Adjust insulin based on your intake of carbohydrates. Carbohydrates can raise blood glucose levels but need to be included in your diet. Carbohydrates provide vitamins, minerals, and fiber which are an essential part of a healthy diet. Carbohydrates are found in fruits, vegetables, whole grains, dairy products, legumes, and foods containing added sugars.    Eat healthy foods. Alternate 3 meals with 3 snacks.  Lose weight if overweight.  Carry a medical alert card or wear your medical alert jewelry.  Carry a 15 gram carbohydrate snack with you at all times to treat low blood glucose (hypoglycemia). Some examples of 15 gram carbohydrate snacks include:  Glucose tablets, 3 or 4   Glucose gel, 15 gram tube  Raisins, 2 tablespoons (24 grams)  Jelly beans, 6  Animal crackers, 8  Regular pop, 4 ounces (120 mL)  Gummy treats, 9  Recognize hypoglycemia. Hypoglycemia occurs with blood glucose levels of 70 mg/dL and below. The risk for hypoglycemia increases when fasting or skipping meals, during or after intense exercise, and during sleep. Hypoglycemia symptoms can include:  Tremors or shakes.  Decreased ability to concentrate.  Sweating.  Increased heart rate.  Headache.  Dry mouth.  Hunger.  Irritability.  Anxiety.  Restless sleep.  Altered speech or coordination.  Confusion.  Treat hypoglycemia promptly. If you are alert and able to  safely swallow, follow the 15:15 rule:  Take 15 20 grams of rapid-acting glucose or carbohydrate. Rapid-acting options include glucose gel, glucose tablets, or 4 ounces (120 mL) of fruit juice, regular soda, or low  fat milk.  Check your blood glucose level 15 minutes after taking the glucose.  Take 15 20 grams more of glucose if the repeat blood glucose level is still 70 mg/dL or below.  Eat a meal or snack within 1 hour once blood glucose levels return to normal.    Be alert to polyuria and polydipsia which are early signs of hyperglycemia. An early awareness of hyperglycemia allows for prompt treatment. Treat hyperglycemia as directed by your caregiver.  Engage in at least 150 minutes of moderate-intensity physical activity a week, spread over at least 3 days of the week or as directed by your caregiver. In addition, you should engage in resistance exercise at least 2 times a week or as directed by your caregiver.  Adjust your medicine and food intake as needed if you start a new exercise or sport.  Follow your sick day plan at any time you are unable to eat or drink as usual.  Avoid tobacco use.  Limit alcohol intake to no more than 1 drink per day for nonpregnant women and 2 drinks per day for men. You should drink alcohol only when you are also eating food. Talk with your caregiver whether alcohol is safe for you. Tell your caregiver if you drink alcohol several times a week.  Follow up with your caregiver regularly.  Schedule an eye exam soon after the diagnosis of type 2 diabetes and then annually.  Perform daily skin and foot care. Examine your skin and feet daily for cuts, bruises, redness, nail problems, bleeding, blisters, or sores. A foot exam by a caregiver should be done annually.  Brush your teeth and gums at least twice a day and floss at least once a day. Follow up with your dentist regularly.  Share your diabetes management plan with your workplace or  school.  Stay up-to-date with immunizations.  Learn to manage stress.  Obtain ongoing diabetes education and support as needed.  Participate in, or seek rehabilitation as needed to maintain or improve independence and quality of life. Request a physical or occupational therapy referral if you are having foot or hand numbness or difficulties with grooming, dressing, eating, or physical activity. SEEK MEDICAL CARE IF:   You are unable to eat food or drink fluids for more than 6 hours.  You have nausea and vomiting for more than 6 hours.  Your blood glucose level is over 240 mg/dL.  There is a change in mental status.  You develop an additional serious illness.  You have diarrhea for more than 6 hours.  You have been sick or have had a fever for a couple of days and are not getting better.  You have pain during any physical activity.  SEEK IMMEDIATE MEDICAL CARE IF:  You have difficulty breathing.  You have moderate to large ketone levels. MAKE SURE YOU:  Understand these instructions.  Will watch your condition.  Will get help right away if you are not doing well or get worse. Document Released: 10/12/2005 Document Revised: 07/06/2012 Document Reviewed: 05/10/2012 [facility] Patient Information 2014 [place].  Diabetes and Exercise Regular exercise is important and can help:   Control blood glucose (sugar).  Decrease blood pressure.    Control blood lipids (cholesterol, triglycerides).  Improve overall health. BENEFITS FROM EXERCISE  Improved fitness.  Improved flexibility.  Improved endurance.  Increased bone density.  Weight control.  Increased muscle strength.  Decreased body fat.  Improvement of the body's use of insulin, a hormone.  Increased insulin  sensitivity.  Reduction of insulin needs.  Reduced stress and tension.  Helps you feel better. People with diabetes who add exercise to their lifestyle gain additional benefits,  including:  Weight loss.  Reduced appetite.  Improvement of the body's use of blood glucose.  Decreased risk factors for heart disease:  Lowering of cholesterol and triglycerides.  Raising the level of good cholesterol (high-density lipoproteins, HDL).  Lowering blood sugar.  Decreased blood pressure. TYPE 1 DIABETES AND EXERCISE  Exercise will usually lower your blood glucose.  If blood glucose is greater than 240 mg/dl, check urine ketones. If ketones are present, do not exercise.  Location of the insulin injection sites may need to be adjusted with exercise. Avoid injecting insulin into areas of the body that will be exercised. For example, avoid injecting insulin into:  The arms when playing tennis.  The legs when jogging. For more information, discuss this with your caregiver.  Keep a record of:  Food intake.  Type and amount of exercise.  Expected peak times of insulin action.  Blood glucose levels. Do this before, during, and after exercise. Review your records with your caregiver. This will help you to develop guidelines for adjusting food intake and insulin amounts.  TYPE 2 DIABETES AND EXERCISE  Regular physical activity can help control blood glucose.  Exercise is important because it may:  Increase the body's sensitivity to insulin.  Improve blood glucose control.  Exercise reduces the risk of heart disease. It decreases serum cholesterol and triglycerides. It also lowers blood pressure.  Those who take insulin or oral hypoglycemic agents should watch for signs of hypoglycemia. These signs include dizziness, shaking, sweating, chills, and confusion.  Body water is lost during exercise. It must be replaced. This will help to avoid loss of body fluids (dehydration) or heat stroke. Be sure to talk to your caregiver before starting an exercise program to make sure it is safe for you. Remember, any activity is better than none.  Document Released:  01/02/2004 Document Revised: 01/04/2012 Document Reviewed: 04/18/2009 [facility] Patient Information 2014 [place], [place].

## 2013-08-18 ENCOUNTER — Encounter: Payer: Self-pay | Admitting: Internal Medicine

## 2013-08-18 NOTE — Progress Notes (Signed)
  Subjective:    Patient ID: Kristy [name], female    DOB: [birth year], 57 y.o.   MRN: [id number]  HPI Comments: 57 y.o female PMH uncontrolled DM2 (HA1C 8.5 in 02/2013 and 10.8 today with cbg 186), RAS s/p stent (follows with Dr. Talbot Grumbling), HTN (uncontrolled BP 155/94), dyslipidemia (LDL 89 11/2012), chronic knee pain (right knee s/p replacement per pt by Dr. 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  She states her DM was more controlled when she was on Lantus vs NPH.  She was unable to afford Lantus in the past but now she has Medicare.    She needs Rx refills of Pravastatin.  She states Metformin 1000 mg bid-->nausea so she has been skipping some doses.  She is taking Ultram for chronic pain (knees).  She is no longer taking Clonidine   SH: On disability, quit smoking 7-8 years ago.  6 kids, 4 grandkids  HM: She wants the flu shot today      Review of Systems  Constitutional:       Lost 10 lbs by cutting back on food   Respiratory: Negative for shortness of breath.   Cardiovascular: Negative for chest pain.  Gastrointestinal: Negative for constipation.  Genitourinary: Negative for difficulty urinating.       Objective:   Physical Exam  Nursing note and vitals reviewed. Constitutional: She is oriented to person, place, and time. She appears well-developed and well-nourished. She is cooperative. No distress.  HENT:  Head: Normocephalic and atraumatic.  Mouth/Throat: Oropharynx is clear and moist and mucous membranes are normal. Abnormal dentition. No oropharyngeal exudate.  Eyes: Conjunctivae are normal. Pupils are equal, round, and reactive to light. Right eye exhibits no discharge. Left eye exhibits no discharge. No scleral icterus.  Cardiovascular: Normal rate, regular rhythm and normal heart sounds.   No murmur  heard. 1+ lower extremity edema b/l   Pulmonary/Chest: Effort normal and breath sounds normal. No respiratory distress. She has no wheezes.  Abdominal: Soft. Bowel sounds are normal. There is no tenderness.  Obese ab  Neurological: She is alert and oriented to person, place, and time. Gait normal.  Skin: Skin is warm, dry and intact. No rash noted. She is not diaphoretic.  Psychiatric: She has a normal mood and affect. Her speech is normal and behavior is normal. Judgment and thought content normal. Cognition and memory are normal.          Assessment & Plan:  F/u in 3 months

## 2013-08-23 DIAGNOSIS — Z0000 Encounter for general adult medical examination without abnormal findings: Secondary | ICD-10-CM | POA: Insufficient documentation

## 2013-08-23 DIAGNOSIS — Z124 Encounter for screening for malignant neoplasm of cervix: Secondary | ICD-10-CM | POA: Insufficient documentation

## 2013-08-23 NOTE — Assessment & Plan Note (Addendum)
Lipid Panel     Component Value Date/Time   CHOL 182 12/20/2012 1500   TRIG 223* 12/20/2012 1500   HDL 48 12/20/2012 1500   CHOLHDL 3.8 12/20/2012 1500   VLDL 45* 12/20/2012 1500   LDLCALC 89 12/20/2012 1500   Rx Pravachol 80 mg qhs  Check lipid panel in 3 mos

## 2013-08-23 NOTE — Assessment & Plan Note (Signed)
Rx Refill Tramadol 50 mg q8 hours

## 2013-08-23 NOTE — Assessment & Plan Note (Addendum)
Follows w/ Dr. Sherren Mocha cardiology

## 2013-08-23 NOTE — Assessment & Plan Note (Addendum)
Referred for Mammogram  Due for colonoscopy in 2016 Flu shot given 10/23

## 2013-08-23 NOTE — Progress Notes (Signed)
Case discussed with Dr. McLean at the time of the visit.  We reviewed the resident's history and exam and pertinent patient test results.  I agree with the assessment, diagnosis and plan of care documented in the resident's note. 

## 2013-08-23 NOTE — Assessment & Plan Note (Addendum)
Lab Results  Component Value Date   HGBA1C 10.8 08/17/2013   HGBA1C 8.5 03/14/2013   HGBA1C 8.3 12/20/2012     Assessment: Diabetes control: poor control (HgbA1C >9%) Progress toward A1C goal:  deteriorated Comments: HA1C deteriorated since 02/2013. Metformin 1000 mg bid-->nausea   Plan: Medications:  Pt feels NPH 35 units qhs is not as effective as Lantus and now she has Medicare insuranc and would like to change to Lantus again.  Rx Lantus 35 units qhs. Will decrease Metformin to 500 mg bid due to side effects. Home glucose monitoring: Frequency: 3 times a day Timing: before meals;at bedtime Instruction/counseling given: reminded to get eye exam, reminded to bring blood glucose meter & log to each visit, reminded to bring medications to each visit, provided printed educational material and other instruction/counseling: will refer to [employer] resources provided: brochure;handout Self management tools provided: copy of home glucose meter download;home glucose logbook Other plans: refer for MNT with Barry Brunner, foot exam performed today

## 2013-08-23 NOTE — Assessment & Plan Note (Addendum)
BP Readings from Last 3 Encounters:  08/17/13 151/96  03/14/13 148/93  12/20/12 161/109    Lab Results  Component Value Date   NA 141 12/20/2012   K 4.0 12/20/2012   CREATININE 1.06 12/20/2012    Assessment: Blood pressure control: mildly elevated Progress toward BP goal:  deteriorated Comments: 2 readings elevated today   Plan: Medications:  continue current medications (Norvasc 10 mg), Lotensin 40 mg qd, Lasix 80 (take 1.5 tab), Coreg 25 mg bid, Aspirin 81 mg.  She is not taking Clonidine anymore.  Will add Hytrin 1 mg qhs  Educational resources provided: brochure;handout Self management tools provided: home blood pressure logbook Other plans: see above.

## 2013-08-31 ENCOUNTER — Other Ambulatory Visit: Payer: Self-pay | Admitting: *Deleted

## 2013-08-31 ENCOUNTER — Encounter: Payer: Self-pay | Admitting: Dietician

## 2013-08-31 MED ORDER — AMLODIPINE BESYLATE 10 MG PO TABS: 10.0000 mg | ORAL_TABLET | Freq: Every day | ORAL | Status: DC

## 2013-09-01 ENCOUNTER — Telehealth: Payer: Self-pay | Admitting: Dietician

## 2013-09-11 NOTE — Telephone Encounter (Signed)
Patient says she has been to [facility]- Dr. Nicki Reaper this year. Called office and requested report to be faxed.

## 2013-09-28 ENCOUNTER — Ambulatory Visit (HOSPITAL_COMMUNITY)
Admission: RE | Admit: 2013-09-28 | Discharge: 2013-09-28 | Disposition: A | Discharge status: Home / Self Care | Payer: Medicare Other | Source: Ambulatory Visit | Attending: Internal Medicine | Admitting: Internal Medicine

## 2013-09-28 DIAGNOSIS — Z0000 Encounter for general adult medical examination without abnormal findings: Secondary | ICD-10-CM

## 2013-09-28 DIAGNOSIS — Z1231 Encounter for screening mammogram for malignant neoplasm of breast: Secondary | ICD-10-CM | POA: Insufficient documentation

## 2013-11-16 ENCOUNTER — Ambulatory Visit (INDEPENDENT_AMBULATORY_CARE_PROVIDER_SITE_OTHER): Payer: Medicare Other | Admitting: Dietician

## 2013-11-16 ENCOUNTER — Encounter: Payer: Self-pay | Admitting: Internal Medicine

## 2013-11-16 ENCOUNTER — Ambulatory Visit (INDEPENDENT_AMBULATORY_CARE_PROVIDER_SITE_OTHER): Payer: Medicare Other | Admitting: Internal Medicine

## 2013-11-16 VITALS — BP 155/87 | HR 80 | Temp 97.5°F | Ht 62.0 in | Wt 221.6 lb

## 2013-11-16 DIAGNOSIS — M25569 Pain in unspecified knee: Secondary | ICD-10-CM

## 2013-11-16 DIAGNOSIS — E1149 Type 2 diabetes mellitus with other diabetic neurological complication: Secondary | ICD-10-CM

## 2013-11-16 DIAGNOSIS — Z0000 Encounter for general adult medical examination without abnormal findings: Secondary | ICD-10-CM

## 2013-11-16 DIAGNOSIS — G8929 Other chronic pain: Secondary | ICD-10-CM

## 2013-11-16 DIAGNOSIS — E785 Hyperlipidemia, unspecified: Secondary | ICD-10-CM

## 2013-11-16 DIAGNOSIS — I10 Essential (primary) hypertension: Secondary | ICD-10-CM

## 2013-11-16 LAB — BASIC METABOLIC PANEL WITH GFR
BUN: 18 mg/dL (ref 6–23)
CALCIUM: 9.7 mg/dL (ref 8.4–10.5)
CO2: 25 mEq/L (ref 19–32)
CREATININE: 1.35 mg/dL — AB (ref 0.50–1.10)
Chloride: 104 mEq/L (ref 96–112)
GFR, EST AFRICAN AMERICAN: 50 mL/min — AB
GFR, Est Non African American: 44 mL/min — ABNORMAL LOW
Glucose, Bld: 121 mg/dL — ABNORMAL HIGH (ref 70–99)
Potassium: 4.4 mEq/L (ref 3.5–5.3)
SODIUM: 141 meq/L (ref 135–145)

## 2013-11-16 LAB — LIPID PANEL
Cholesterol: 180 mg/dL (ref 0–200)
HDL: 42 mg/dL (ref >39)
LDL CALC: 97 mg/dL (ref 0–99)
Total CHOL/HDL Ratio: 4.3 Ratio
Triglycerides: 206 mg/dL — ABNORMAL HIGH (ref <150)
VLDL: 41 mg/dL — ABNORMAL HIGH (ref 0–40)

## 2013-11-16 LAB — POCT GLYCOSYLATED HEMOGLOBIN (HGB A1C): Hemoglobin A1C: 7.7

## 2013-11-16 LAB — GLUCOSE, CAPILLARY: Glucose-Capillary: 126 mg/dL — ABNORMAL HIGH (ref 70–99)

## 2013-11-16 MED ORDER — FUROSEMIDE 80 MG PO TABS: ORAL_TABLET | ORAL | Status: DC

## 2013-11-16 MED ORDER — "INSULIN SYRINGE 31G X 5/16"" 0.5 ML MISC": 1.0000 | Freq: Every day | Status: DC

## 2013-11-16 NOTE — Assessment & Plan Note (Signed)
Lipid Panel     Component Value Date/Time   CHOL 182 12/20/2012 1500   TRIG 223* 12/20/2012 1500   HDL 48 12/20/2012 1500   CHOLHDL 3.8 12/20/2012 1500   VLDL 45* 12/20/2012 1500   LDLCALC 89 12/20/2012 1500   Will check lipid panel today

## 2013-11-16 NOTE — Assessment & Plan Note (Addendum)
BP Readings from Last 3 Encounters:  11/16/13 155/87  08/17/13 151/96  03/14/13 148/93    Lab Results  Component Value Date   NA 141 12/20/2012   K 4.0 12/20/2012   CREATININE 1.06 12/20/2012    Assessment: Blood pressure control: mildly elevated Progress toward BP goal:  improved Comments: likely elevated b/c has not taken 2nd dose of Coreg 25, Norvasc 10, Benazipril 40, or Hytrin 1 mg today   Plan: Medications:  continue current medications.  In addition to above also on Lasix 80 mg 1.5 tab per day  Educational resources provided: brochure Self management tools provided: home blood pressure logbook;other (see comments) Other plans: recheck at follow up, check BMET today.  Needs Rx refill of Lasix

## 2013-11-16 NOTE — Assessment & Plan Note (Signed)
Pt taking Tramadol 100 mg qd to bid No refills needed at this time

## 2013-11-16 NOTE — Progress Notes (Signed)
Case discussed with Dr. McLean soon after the resident saw the patient.  We reviewed the resident's history and exam and pertinent patient test results.  I agree with the assessment, diagnosis, and plan of care documented in the resident's note. 

## 2013-11-16 NOTE — Assessment & Plan Note (Addendum)
Lab Results  Component Value Date   HGBA1C 7.7 11/16/2013   HGBA1C 10.8 08/17/2013   HGBA1C 8.5 03/14/2013     Assessment: Diabetes control: fair control (pending HA1C today) Progress toward A1C goal:  improved Comments: HA1C improved   Plan: Medications:  continue current medications (Lantus 35 units), Metformin 500 mg bid  Home glucose monitoring: Frequency: 3 times a day Timing: before meals Instruction/counseling given: reminded to bring blood glucose meter & log to each visit, reminded to bring medications to each visit and provided printed educational material Educational resources provided: brochure Self management tools provided: other (see comments) Other plans: review meter at next visit to see if pt needs medication change Needs Rx refill syringes 31 G 8 mm (5/16) 1/2 cc
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
## 2013-11-16 NOTE — Patient Instructions (Addendum)
General Instructions: Please follow up in 3 months, sooner if needed.  We will do pap smear at that time.   We will check your cholesterol today and kidney numbers today  Try to exercise, take your medications as prescribed, bring your meter next time Read all info given    Treatment Goals:  Goals (1 Years of Data) as of 11/16/13         As of Today As of Today 08/17/13 08/17/13 03/14/13     Blood Pressure    . Blood Pressure < 140/90  155/87 146/88 151/96 155/94 148/93     Result Component    . HEMOGLOBIN A1C < 7.0  7.7  10.8  8.5      Progress Toward Treatment Goals:  Treatment Goal 11/16/2013  Hemoglobin A1C improved  Blood pressure improved  Prevent falls at goal    Self Care Goals & Plans:  Self Care Goal 11/16/2013  Manage my medications take my medicines as prescribed; bring my medications to every visit; refill my medications on time; follow the sick day instructions if I am sick  Monitor my health keep track of my blood glucose; keep track of my blood pressure; keep track of my weight  Eat healthy foods drink diet soda or water instead of juice or soda; eat more vegetables; eat foods that are low in salt; eat baked foods instead of fried foods; eat fruit for snacks and desserts; eat smaller portions  Be physically active find an activity I enjoy  Prevent falls (No Data)  Meeting treatment goals maintain the current self-care plan    Home Blood Glucose Monitoring 11/16/2013  Check my blood sugar 3 times a day  When to check my blood sugar before meals     Care Management & Community Referrals:  Referral 11/16/2013  Referrals made for care management support none needed  Referrals made to community resources none       Diabetes and Exercise Exercising regularly is important. It is not just about losing weight. It has many health benefits, such as:  Improving your overall fitness, flexibility, and endurance.  Increasing your bone density.  Helping with  weight control.  Decreasing your body fat.  Increasing your muscle strength.  Reducing stress and tension.  Improving your overall health. People with diabetes who exercise gain additional benefits because exercise:  Reduces appetite.  Improves the body's use of blood sugar (glucose).  Helps lower or control blood glucose.  Decreases blood pressure.  Helps control blood lipids (such as cholesterol and triglycerides).  Improves the body's use of the hormone insulin by:  Increasing the body's insulin sensitivity.  Reducing the body's insulin needs.  Decreases the risk for heart disease because exercising:  Lowers cholesterol and triglycerides levels.  Increases the levels of good cholesterol (such as high-density lipoproteins [HDL]) in the body.  Lowers blood glucose levels. YOUR ACTIVITY PLAN  Choose an activity that you enjoy and set realistic goals. Your health care provider or diabetes educator can help you make an activity plan that works for you. You can break activities into 2 or 3 sessions throughout the day. Doing so is as good as one long session. Exercise ideas include:  Taking the dog for a walk.  Taking the stairs instead of the elevator.  Dancing to your favorite song.  Doing your favorite exercise with a friend. RECOMMENDATIONS FOR EXERCISING WITH TYPE 1 OR TYPE 2 DIABETES   Check your blood glucose before exercising. If blood glucose levels  are greater than 240 mg/dL, check for urine ketones. Do not exercise if ketones are present.  Avoid injecting insulin into areas of the body that are going to be exercised. For example, avoid injecting insulin into:  The arms when playing tennis.  The legs when jogging.  Keep a record of:  Food intake before and after you exercise.  Expected peak times of insulin action.  Blood glucose levels before and after you exercise.  The type and amount of exercise you have done.  Review your records with your  health care provider. Your health care provider will help you to develop guidelines for adjusting food intake and insulin amounts before and after exercising.  If you take insulin or oral hypoglycemic agents, watch for signs and symptoms of hypoglycemia. They include:  Dizziness.  Shaking.  Sweating.  Chills.  Confusion.  Drink plenty of water while you exercise to prevent dehydration or heat stroke. Body water is lost during exercise and must be replaced.  Talk to your health care provider before starting an exercise program to make sure it is safe for you. Remember, almost any type of activity is better than none. Document Released: 01/02/2004 Document Revised: 06/14/2013 Document Reviewed: 03/21/2013 [facility] Patient Information 2014 [place].  Hypertension As your heart beats, it forces blood through your arteries. This force is your blood pressure. If the pressure is too high, it is called hypertension (HTN) or high blood pressure. HTN is dangerous because you may have it and not know it. High blood pressure may mean that your heart has to work harder to pump blood. Your arteries may be narrow or stiff. The extra work puts you at risk for heart disease, stroke, and other problems.  Blood pressure consists of two numbers, a higher number over a lower, 110/72, for example. It is stated as "110 over 72." The ideal is below 120 for the top number (systolic) and under 80 for the bottom (diastolic). Write down your blood pressure today. You should pay close attention to your blood pressure if you have certain conditions such as:  Heart failure.  Prior heart attack.  Diabetes  Chronic kidney disease.  Prior stroke.  Multiple risk factors for heart disease. To see if you have HTN, your blood pressure should be measured while you are seated with your arm held at the level of the heart. It should be measured at least twice. A one-time elevated blood pressure reading (especially  in the Emergency Department) does not mean that you need treatment. There may be conditions in which the blood pressure is different between your right and left arms. It is important to see your caregiver soon for a recheck. Most people have essential hypertension which means that there is not a specific cause. This type of high blood pressure may be lowered by changing lifestyle factors such as:  Stress.  Smoking.  Lack of exercise.  Excessive weight.  Drug/tobacco/alcohol use.  Eating less salt. Most people do not have symptoms from high blood pressure until it has caused damage to the body. Effective treatment can often prevent, delay or reduce that damage. TREATMENT  When a cause has been identified, treatment for high blood pressure is directed at the cause. There are a large number of medications to treat HTN. These fall into several categories, and your caregiver will help you select the medicines that are best for you. Medications may have side effects. You should review side effects with your caregiver. If your blood pressure  stays high after you have made lifestyle changes or started on medicines,   Your medication(s) may need to be changed.  Other problems may need to be addressed.  Be certain you understand your prescriptions, and know how and when to take your medicine.  Be sure to follow up with your caregiver within the time frame advised (usually within two weeks) to have your blood pressure rechecked and to review your medications.  If you are taking more than one medicine to lower your blood pressure, make sure you know how and at what times they should be taken. Taking two medicines at the same time can result in blood pressure that is too low. SEEK IMMEDIATE MEDICAL CARE IF:  You develop a severe headache, blurred or changing vision, or confusion.  You have unusual weakness or numbness, or a faint feeling.  You have severe chest or abdominal pain, vomiting, or  breathing problems. MAKE SURE YOU:   Understand these instructions.  Will watch your condition.  Will get help right away if you are not doing well or get worse. Document Released: 10/12/2005 Document Revised: 01/04/2012 Document Reviewed: 06/01/2008 [facility] Patient Information 2014 [place].  Fall Prevention and Home Safety Falls cause injuries and can affect all age groups. It is possible to prevent falls.  HOW TO PREVENT FALLS  Wear shoes with rubber soles that do not have an opening for your toes.  Keep the inside and outside of your house well lit.  Use night lights throughout your home.  Remove clutter from floors.  Clean up floor spills.  Remove throw rugs or fasten them to the floor with carpet tape.  Do not place electrical cords across pathways.  Put grab bars by your tub, shower, and toilet. Do not use towel bars as grab bars.  Put handrails on both sides of the stairway. Fix loose handrails.  Do not climb on stools or stepladders, if possible.  Do not wax your floors.  Repair uneven or unsafe sidewalks, walkways, or stairs.  Keep items you use a lot within reach.  Be aware of pets.  Keep emergency numbers next to the telephone.  Put smoke detectors in your home and near bedrooms. Ask your doctor what other things you can do to prevent falls. Document Released: 08/08/2009 Document Revised: 04/12/2012 Document Reviewed: 01/12/2012 [facility] Patient Information 2014 [place], [place].

## 2013-11-16 NOTE — Patient Instructions (Signed)
Try to eat smaller portions over the 2-3 weeks before we meet again.  Try to eat small snacks between meals or eat before you get hungry.  Please make a follow up appointment for 3 weeks

## 2013-11-16 NOTE — Assessment & Plan Note (Signed)
Pap smear at follow up visit  Lipid panel today

## 2013-11-16 NOTE — Progress Notes (Signed)
Medical Nutrition Therapy:  Appt start time: [id number] end time:  1500.  Assessment:  Primary concerns today: Weight management.  Patient weight increased ~ #3 with significant decrease in her A1C at same time. Medications noted. She  reports one of her problems is she gets too hungry sometimes then overeats. Usual eating pattern includes 3 meals and 0-1 snacks per day. Frequent foods include baked foods, venison and vegetables(from garden), chocolate cake.  Avoided foods include milk, fried foods.   24-hr recall: (Up at  5-8 AM) B ( 9 AM)-  [place] bacon, egg, banana, dry cheerios   Snk ( AM)-  Peanut butter crackers, diet soda L ( 12 PM)- baked chicken or [place], vegetables, beans, limits starchy foods like rice, pasta and potatoes   D (6-7 PM)- baked chicken or [place]  , vegetables, beans, limits starchy foods like rice, pasta and potatoes Does not often  snack after dinner Usual physical activity includes activities of daily living, cooking and house cleaning and  walks when able ( s/p TKR which bothers her) .she was interested in [employer], but her plan does not participate   Progress Towards Goal(s):  In progress.   Nutritional Diagnosis:  NB-1.1 Food and nutrition-related knowledge deficit As related to lack of previous exposure to diabetes meal planning.  As evidenced by her report of no previous edcuation and training.    Intervention:  Nutrition education about carb counting, used visuals for portions and food groups. Gave patient menu and shopping list for [employer].  Coordination of care- assisted patient with finding out about [employer] and other local exercise opportunities  Monitoring/Evaluation:  Dietary intake, exercise, meter/logbook, and body weight in 3 week(s).

## 2013-11-17 ENCOUNTER — Encounter: Payer: Self-pay | Admitting: Internal Medicine

## 2013-11-20 ENCOUNTER — Other Ambulatory Visit: Payer: Self-pay | Admitting: *Deleted

## 2013-11-20 NOTE — Telephone Encounter (Signed)
Pt states she test BS three times a day, Uses Prime meter, relion.

## 2013-11-21 ENCOUNTER — Other Ambulatory Visit: Payer: Self-pay | Admitting: *Deleted

## 2013-11-21 MED ORDER — GLUCOSE BLOOD VI STRP: ORAL_STRIP | Status: DC

## 2013-11-21 MED ORDER — TRAMADOL HCL 50 MG PO TABS: 50.0000 mg | ORAL_TABLET | Freq: Three times a day (TID) | ORAL | Status: DC | PRN

## 2013-11-21 NOTE — Telephone Encounter (Signed)
Tramadol called in

## 2013-11-21 NOTE — Telephone Encounter (Signed)
The pharmacy needed a new script due to the fact for insurance to pay the body of the script must state diag code, amt of times testing and insulin dependent or not.  Thanks and sorry for the trouble

## 2013-12-11 ENCOUNTER — Other Ambulatory Visit: Payer: Self-pay | Admitting: Internal Medicine

## 2013-12-11 ENCOUNTER — Other Ambulatory Visit: Payer: Self-pay | Admitting: *Deleted

## 2013-12-11 MED ORDER — TRAMADOL HCL 50 MG PO TABS: 50.0000 mg | ORAL_TABLET | Freq: Three times a day (TID) | ORAL | Status: DC | PRN

## 2013-12-11 NOTE — Telephone Encounter (Signed)
Called in.

## 2014-01-15 ENCOUNTER — Other Ambulatory Visit: Payer: Self-pay | Admitting: Internal Medicine

## 2014-01-15 ENCOUNTER — Other Ambulatory Visit: Payer: Self-pay | Admitting: *Deleted

## 2014-01-15 DIAGNOSIS — M25561 Pain in right knee: Secondary | ICD-10-CM

## 2014-01-15 MED ORDER — TRAMADOL HCL 50 MG PO TABS: 50.0000 mg | ORAL_TABLET | Freq: Three times a day (TID) | ORAL | Status: DC | PRN

## 2014-01-15 NOTE — Telephone Encounter (Signed)
Tramadol rx faxed to [place]; pt and pt's sister, Janece Canterbury, informed.

## 2014-01-15 NOTE — Telephone Encounter (Signed)
States she's completely out.

## 2014-02-15 ENCOUNTER — Ambulatory Visit (INDEPENDENT_AMBULATORY_CARE_PROVIDER_SITE_OTHER): Payer: Medicare Other | Admitting: Internal Medicine

## 2014-02-15 ENCOUNTER — Encounter: Payer: Self-pay | Admitting: Internal Medicine

## 2014-02-15 VITALS — BP 152/96 | HR 78 | Temp 96.6°F | Ht 62.0 in | Wt 220.7 lb

## 2014-02-15 DIAGNOSIS — R809 Proteinuria, unspecified: Secondary | ICD-10-CM

## 2014-02-15 DIAGNOSIS — E1142 Type 2 diabetes mellitus with diabetic polyneuropathy: Secondary | ICD-10-CM

## 2014-02-15 DIAGNOSIS — G8929 Other chronic pain: Secondary | ICD-10-CM

## 2014-02-15 DIAGNOSIS — M25569 Pain in unspecified knee: Secondary | ICD-10-CM

## 2014-02-15 DIAGNOSIS — I10 Essential (primary) hypertension: Secondary | ICD-10-CM

## 2014-02-15 DIAGNOSIS — Z0000 Encounter for general adult medical examination without abnormal findings: Secondary | ICD-10-CM

## 2014-02-15 DIAGNOSIS — E1149 Type 2 diabetes mellitus with other diabetic neurological complication: Secondary | ICD-10-CM

## 2014-02-15 DIAGNOSIS — M25561 Pain in right knee: Secondary | ICD-10-CM

## 2014-02-15 LAB — BASIC METABOLIC PANEL WITH GFR
BUN: 15 mg/dL (ref 6–23)
CHLORIDE: 104 meq/L (ref 96–112)
CO2: 26 mEq/L (ref 19–32)
Calcium: 9.6 mg/dL (ref 8.4–10.5)
Creat: 0.98 mg/dL (ref 0.50–1.10)
GFR, EST NON AFRICAN AMERICAN: 64 mL/min
GFR, Est African American: 74 mL/min
Glucose, Bld: 149 mg/dL — ABNORMAL HIGH (ref 70–99)
POTASSIUM: 4.3 meq/L (ref 3.5–5.3)
SODIUM: 138 meq/L (ref 135–145)

## 2014-02-15 LAB — GLUCOSE, CAPILLARY: GLUCOSE-CAPILLARY: 150 mg/dL — AB (ref 70–99)

## 2014-02-15 LAB — POCT GLYCOSYLATED HEMOGLOBIN (HGB A1C): Hemoglobin A1C: 7.2

## 2014-02-15 MED ORDER — TRAMADOL HCL 50 MG PO TABS: 50.0000 mg | ORAL_TABLET | Freq: Three times a day (TID) | ORAL | Status: DC | PRN

## 2014-02-15 MED ORDER — GABAPENTIN 100 MG PO CAPS: 100.0000 mg | ORAL_CAPSULE | Freq: Three times a day (TID) | ORAL | Status: DC

## 2014-02-15 NOTE — Assessment & Plan Note (Signed)
Peripheral neuropathy in feet will try Neurontin 100 mg tid  Titrate up in future if needed

## 2014-02-15 NOTE — Assessment & Plan Note (Signed)
Will check urine microAlb/Cr today

## 2014-02-15 NOTE — Patient Instructions (Addendum)
General Instructions: Please come back for a morning pap smear  Please take your blood pressure medications in the morning to prevent your blood pressure from being so high a lunch and night Follow up in 3 months (try to get am appt to do pap smear) Try the new medication for pain in feet (gabapentin)   Treatment Goals:  Goals (1 Years of Data) as of 02/15/14         As of Today As of Today 11/16/13 11/16/13 08/17/13     Blood Pressure    . Blood Pressure < 140/90  152/96 159/94 155/87 146/88 151/96     Result Component    . HEMOGLOBIN A1C < 7.0  7.2  7.7  10.8      Progress Toward Treatment Goals:  Treatment Goal 02/15/2014  Hemoglobin A1C improved  Blood pressure unchanged  Prevent falls unable to assess    Self Care Goals & Plans:  Self Care Goal 02/15/2014  Manage my medications take my medicines as prescribed; bring my medications to every visit; refill my medications on time  Monitor my health keep track of my blood glucose; bring my glucose meter and log to each visit; keep track of my blood pressure; bring my blood pressure log to each visit; keep track of my weight; check my feet daily  Eat healthy foods drink diet soda or water instead of juice or soda; eat more vegetables; eat foods that are low in salt; eat baked foods instead of fried foods; eat fruit for snacks and desserts; eat smaller portions  Be physically active find an activity I enjoy  Prevent falls -  Meeting treatment goals maintain the current self-care plan    Home Blood Glucose Monitoring 02/15/2014  Check my blood sugar 3 times a day  When to check my blood sugar before meals     Care Management & Community Referrals:  Referral 02/15/2014  Referrals made for care management support none needed  Referrals made to community resources none       DASH Diet The DASH diet stands for "Dietary Approaches to Stop Hypertension." It is a healthy eating plan that has been shown to reduce high blood  pressure (hypertension) in as little as 14 days, while also possibly providing other significant health benefits. These other health benefits include reducing the risk of breast cancer after menopause and reducing the risk of type 2 diabetes, heart disease, colon cancer, and stroke. Health benefits also include weight loss and slowing kidney failure in patients with chronic kidney disease.  DIET GUIDELINES  Limit salt (sodium). Your diet should contain less than 1500 mg of sodium daily.  Limit refined or processed carbohydrates. Your diet should include mostly whole grains. Desserts and added sugars should be used sparingly.  Include small amounts of heart-healthy fats. These types of fats include nuts, oils, and tub margarine. Limit saturated and trans fats. These fats have been shown to be harmful in the body. CHOOSING FOODS  The following food groups are based on a 2000 calorie diet. See your Registered Dietitian for individual calorie needs. Grains and Grain Products (6 to 8 servings daily)  Eat More Often: Whole-wheat bread, brown rice, whole-grain or wheat pasta, quinoa, popcorn without added fat or salt (air popped).  Eat Less Often: White bread, white pasta, white rice, cornbread. Vegetables (4 to 5 servings daily)  Eat More Often: Fresh, frozen, and canned vegetables. Vegetables may be raw, steamed, roasted, or grilled with a minimal amount of fat.  Eat Less Often/Avoid: Creamed or fried vegetables. Vegetables in a cheese sauce. Fruit (4 to 5 servings daily)  Eat More Often: All fresh, canned (in natural juice), or frozen fruits. Dried fruits without added sugar. One hundred percent fruit juice ( cup [237 mL] daily).  Eat Less Often: Dried fruits with added sugar. Canned fruit in light or heavy syrup. [employer], Fish, and Poultry (2 servings or less daily. One serving is 3 to 4 oz [85-114 g]).  Eat More Often: Ninety percent or leaner ground beef, tenderloin, sirloin. Round cuts  of beef, chicken breast, [place] breast. All fish. Grill, bake, or broil your meat. Nothing should be fried.  Eat Less Often/Avoid: Fatty cuts of meat, [place], or chicken leg, thigh, or wing. Fried cuts of meat or fish. Dairy (2 to 3 servings)  Eat More Often: Low-fat or fat-free milk, low-fat plain or light yogurt, reduced-fat or part-skim cheese.  Eat Less Often/Avoid: Milk (whole, 2%).Whole milk yogurt. Full-fat cheeses. Nuts, Seeds, and Legumes (4 to 5 servings per week)  Eat More Often: All without added salt.  Eat Less Often/Avoid: Salted nuts and seeds, canned beans with added salt. Fats and Sweets (limited)  Eat More Often: Vegetable oils, tub margarines without trans fats, sugar-free gelatin. Mayonnaise and salad dressings.  Eat Less Often/Avoid: Coconut oils, palm oils, butter, stick margarine, cream, half and half, cookies, candy, pie. FOR MORE INFORMATION The Dash Diet Eating Plan: www.dashdiet.org Document Released: 10/01/2011 Document Revised: 01/04/2012 Document Reviewed: 10/01/2011 [facility] Patient Information 2014 [place], [place].  Hypertension As your heart beats, it forces blood through your arteries. This force is your blood pressure. If the pressure is too high, it is called hypertension (HTN) or high blood pressure. HTN is dangerous because you may have it and not know it. High blood pressure may mean that your heart has to work harder to pump blood. Your arteries may be narrow or stiff. The extra work puts you at risk for heart disease, stroke, and other problems.  Blood pressure consists of two numbers, a higher number over a lower, 110/72, for example. It is stated as "110 over 72." The ideal is below 120 for the top number (systolic) and under 80 for the bottom (diastolic). Write down your blood pressure today. You should pay close attention to your blood pressure if you have certain conditions such as:  Heart failure.  Prior heart  attack.  Diabetes  Chronic kidney disease.  Prior stroke.  Multiple risk factors for heart disease. To see if you have HTN, your blood pressure should be measured while you are seated with your arm held at the level of the heart. It should be measured at least twice. A one-time elevated blood pressure reading (especially in the Emergency Department) does not mean that you need treatment. There may be conditions in which the blood pressure is different between your right and left arms. It is important to see your caregiver soon for a recheck. Most people have essential hypertension which means that there is not a specific cause. This type of high blood pressure may be lowered by changing lifestyle factors such as:  Stress.  Smoking.  Lack of exercise.  Excessive weight.  Drug/tobacco/alcohol use.  Eating less salt. Most people do not have symptoms from high blood pressure until it has caused damage to the body. Effective treatment can often prevent, delay or reduce that damage. TREATMENT  When a cause has been identified, treatment for high blood pressure is directed at the  cause. There are a large number of medications to treat HTN. These fall into several categories, and your caregiver will help you select the medicines that are best for you. Medications may have side effects. You should review side effects with your caregiver. If your blood pressure stays high after you have made lifestyle changes or started on medicines,   Your medication(s) may need to be changed.  Other problems may need to be addressed.  Be certain you understand your prescriptions, and know how and when to take your medicine.  Be sure to follow up with your caregiver within the time frame advised (usually within two weeks) to have your blood pressure rechecked and to review your medications.  If you are taking more than one medicine to lower your blood pressure, make sure you know how and at what times they  should be taken. Taking two medicines at the same time can result in blood pressure that is too low. SEEK IMMEDIATE MEDICAL CARE IF:  You develop a severe headache, blurred or changing vision, or confusion.  You have unusual weakness or numbness, or a faint feeling.  You have severe chest or abdominal pain, vomiting, or breathing problems. MAKE SURE YOU:   Understand these instructions.  Will watch your condition.  Will get help right away if you are not doing well or get worse. Document Released: 10/12/2005 Document Revised: 01/04/2012 Document Reviewed: 06/01/2008 [facility] Patient Information 2014 [place].  Type 2 Diabetes Mellitus, Adult Type 2 diabetes mellitus is a long-term (chronic) disease. In type 2 diabetes:  The pancreas does not make enough of a hormone called insulin.  The cells in the body do not respond as well to the insulin that is made.  Both of the above can happen. Normally, insulin moves sugars from food into tissue cells. This gives you energy. If you have type 2 diabetes, sugars cannot be moved into tissue cells. This causes high blood sugar (hyperglycemia).  HOME CARE  Have your hemoglobin A1c level checked twice a year. The level shows if your diabetes is under control or out of control.  Perform daily blood sugar testing as told by your doctor.  Check your ketone levels by testing your pee (urine) when you are sick and as told.  Take your diabetes or insulin medicine as told by your doctor.  Never run out of insulin.  Adjust how much insulin you give yourself based on how many carbs (carbohydrates) you eat. Carbs are in many foods, such as fruits, vegetables, whole grains, and dairy products.  Have a healthy snack between every healthy meal. Have 3 meals and 3 snacks a day.  Lose weight if you are overweight.  Carry a medical alert card or wear your medical alert jewelry.  Carry a 15 gram carb snack with you at all times. Examples  include:  Glucose pills, 3 or 4.  Glucose gel, 15 gram tube.  Raisins, 2 tablespoons (24 grams).  Jelly beans, 6.  Animal crackers, 8.  Sugar pop, 4 ounces (120 milliliters).  Gummy treats, 9.  Notice low blood sugar (hypoglycemia) symptoms, such as:  Shaking (tremors).  Decreased ability to think clearly.  Sweating.  Increased heart rate.  Headache.  Dry mouth.  Hunger.  Crabbiness (irritability).  Being worried or tense (anxiety).  Restless sleep.  A change in speech or coordination.  Confusion.  Treat low blood sugar right away. If you are alert and can swallow, follow the 15:15 rule:  Take 15 20 grams  of a rapid-acting glucose or carb. This includes glucose gel, glucose pills, or 4 ounces (120 milliliters) of fruit juice, regular pop, or low-fat milk.  Check your blood sugar level after taking the glucose.  Take 15 20 grams of more glucose if the repeat blood sugar level is still 70 mg/dL (milligrams/deciliter) or below.  Eat a meal or snack within 1 hour of the blood sugar levels going back to normal.  Notice early symptoms of high blood sugar, such as:  Being really thirsty or drinking a lot (polydipsia).  Peeing (urinating) a lot (polyuria).  Do at least 150 minutes of physical activity a week or as told.  Split the 150 minutes of activity up during the week. Do not do 150 minutes of activity in one day.  Perform exercises, such as weight lifting, at least 2 times a week or as told.  Adjust your insulin or food intake as needed if you start a new exercise or sport.  Follow your sick day plan when you are not able to eat or drink as usual.  Avoid tobacco use.  Women who are not pregnant should drink no more than 1 drink a day. Men should drink no more than 2 drinks a day.  Only drink alcohol with food.  Ask your doctor if alcohol is safe for you.  Tell your doctor if you drink alcohol several times during the week.  See your doctor  regularly.  Schedule an eye exam soon after you are diagnosed with diabetes. Schedule exams once every year.  Check your skin and feet every day. Check for cuts, bruises, redness, nail problems, bleeding, blisters, or sores. A doctor should do a foot exam once a year.  Brush your teeth and gums twice a day. Floss once a day. Visit your dentist regularly.  Share your diabetes plan with your workplace or school.  Stay up-to-date with shots that fight against diseases (immunizations).  Learn how to manage stress.  Get diabetes education and support as needed.  Ask your doctor for special help if:  You need help to maintain or improve how you to do things on your own.  You need help to maintain or improve the quality of your life.  You have foot or hand problems.  You have trouble cleaning yourself, dressing, eating, or doing physical activity. GET HELP RIGHT AWAY IF:  You have trouble breathing.  You have moderate to large ketone levels.  You are unable to eat food or drink fluids for more than 6 hours.  You feel sick to your stomach (nauseous) or throw up (vomit) for more than 6 hours.  Your blood sugar level is over 240 mg/dL.  There is a change in mental status.  You get another serious illness.  You have watery poop (diarrhea) for more than 6 hours.  You have been sick or have had a fever for 2 or more days and are not getting better.  You have pain when you are physically active. MAKE SURE YOU:  Understand these instructions.  Will watch your condition.  Will get help right away if you are not doing well or get worse. Document Released: 07/21/2008 Document Revised: 08/02/2013 Document Reviewed: 02/10/2013 [facility] Patient Information 2014 [place], [place].  Fall Prevention and Home Safety Falls cause injuries and can affect all age groups. It is possible to prevent falls.  HOW TO PREVENT FALLS  Wear shoes with rubber soles that do not have an opening for  your toes.  Keep  the inside and outside of your house well lit.  Use night lights throughout your home.  Remove clutter from floors.  Clean up floor spills.  Remove throw rugs or fasten them to the floor with carpet tape.  Do not place electrical cords across pathways.  Put grab bars by your tub, shower, and toilet. Do not use towel bars as grab bars.  Put handrails on both sides of the stairway. Fix loose handrails.  Do not climb on stools or stepladders, if possible.  Do not wax your floors.  Repair uneven or unsafe sidewalks, walkways, or stairs.  Keep items you use a lot within reach.  Be aware of pets.  Keep emergency numbers next to the telephone.  Put smoke detectors in your home and near bedrooms. Ask your doctor what other things you can do to prevent falls. Document Released: 08/08/2009 Document Revised: 04/12/2012 Document Reviewed: 01/12/2012 [facility] Patient Information 2014 [place], [place].  Smoking Cessation Quitting smoking is important to your health and has many advantages. However, it is not always easy to quit since nicotine is a very addictive drug. Often times, people try 3 times or more before being able to quit. This document explains the best ways for you to prepare to quit smoking. Quitting takes hard work and a lot of effort, but you can do it. ADVANTAGES OF QUITTING SMOKING  You will live longer, feel better, and live better.  Your body will feel the impact of quitting smoking almost immediately.  Within 20 minutes, blood pressure decreases. Your pulse returns to its normal level.  After 8 hours, carbon monoxide levels in the blood return to normal. Your oxygen level increases.  After 24 hours, the chance of having a heart attack starts to decrease. Your breath, hair, and body stop smelling like smoke.  After 48 hours, damaged nerve endings begin to recover. Your sense of taste and smell improve.  After 72 hours, the body is virtually  free of nicotine. Your bronchial tubes relax and breathing becomes easier.  After 2 to 12 weeks, lungs can hold more air. Exercise becomes easier and circulation improves.  The risk of having a heart attack, stroke, cancer, or lung disease is greatly reduced.  After 1 year, the risk of coronary heart disease is cut in half.  After 5 years, the risk of stroke falls to the same as a nonsmoker.  After 10 years, the risk of lung cancer is cut in half and the risk of other cancers decreases significantly.  After 15 years, the risk of coronary heart disease drops, usually to the level of a nonsmoker.  If you are pregnant, quitting smoking will improve your chances of having a healthy baby.  The people you live with, especially any children, will be healthier.  You will have extra money to spend on things other than cigarettes. QUESTIONS TO THINK ABOUT BEFORE ATTEMPTING TO QUIT You may want to talk about your answers with your caregiver.  Why do you want to quit?  If you tried to quit in the past, what helped and what did not?  What will be the most difficult situations for you after you quit? How will you plan to handle them?  Who can help you through the tough times? Your family? Friends? A caregiver?  What pleasures do you get from smoking? What ways can you still get pleasure if you quit? Here are some questions to ask your caregiver:  How can you help me to be successful at  quitting?  What medicine do you think would be best for me and how should I take it?  What should I do if I need more help?  What is smoking withdrawal like? How can I get information on withdrawal? GET READY  Set a quit date.  Change your environment by getting rid of all cigarettes, ashtrays, matches, and lighters in your home, car, or work. Do not let people smoke in your home.  Review your past attempts to quit. Think about what worked and what did not. GET SUPPORT AND ENCOURAGEMENT You have a  better chance of being successful if you have help. You can get support in many ways.  Tell your family, friends, and co-workers that you are going to quit and need their support. Ask them not to smoke around you.  Get individual, group, or telephone counseling and support. Programs are available at [employer] and health centers. Call your local health department for information about programs in your area.  Spiritual beliefs and practices may help some smokers quit.  Download a "quit meter" on your computer to keep track of quit statistics, such as how long you have gone without smoking, cigarettes not smoked, and money saved.  Get a self-help book about quitting smoking and staying off of tobacco. [place] yourself from urges to smoke. Talk to someone, go for a walk, or occupy your time with a task.  Change your normal routine. Take a different route to work. Drink tea instead of coffee. Eat breakfast in a different place.  Reduce your stress. Take a hot bath, exercise, or read a book.  Plan something enjoyable to do every day. Reward yourself for not smoking.  Explore interactive web-based programs that specialize in helping you quit. GET MEDICINE AND USE IT CORRECTLY Medicines can help you stop smoking and decrease the urge to smoke. Combining medicine with the above behavioral methods and support can greatly increase your chances of successfully quitting smoking.  Nicotine replacement therapy helps deliver nicotine to your body without the negative effects and risks of smoking. Nicotine replacement therapy includes nicotine gum, lozenges, inhalers, nasal sprays, and skin patches. Some may be available over-the-counter and others require a prescription.  Antidepressant medicine helps people abstain from smoking, but how this works is unknown. This medicine is available by prescription.  Nicotinic receptor partial agonist medicine simulates the  effect of nicotine in your brain. This medicine is available by prescription. Ask your caregiver for advice about which medicines to use and how to use them based on your health history. Your caregiver will tell you what side effects to look out for if you choose to be on a medicine or therapy. Carefully read the information on the package. Do not use any other product containing nicotine while using a nicotine replacement product.  RELAPSE OR DIFFICULT SITUATIONS Most relapses occur within the first 3 months after quitting. Do not be discouraged if you start smoking again. Remember, most people try several times before finally quitting. You may have symptoms of withdrawal because your body is used to nicotine. You may crave cigarettes, be irritable, feel very hungry, cough often, get headaches, or have difficulty concentrating. The withdrawal symptoms are only temporary. They are strongest when you first quit, but they will go away within 10 14 days. To reduce the chances of relapse, try to:  Avoid drinking alcohol. Drinking lowers your chances of successfully quitting.  Reduce the amount of caffeine you consume. Once  you quit smoking, the amount of caffeine in your body increases and can give you symptoms, such as a rapid heartbeat, sweating, and anxiety.  Avoid smokers because they can make you want to smoke.  Do not let weight gain distract you. Many smokers will gain weight when they quit, usually less than 10 pounds. Eat a healthy diet and stay active. You can always lose the weight gained after you quit.  Find ways to improve your mood other than smoking. FOR MORE INFORMATION  www.smokefree.gov  Document Released: 10/06/2001 Document Revised: 04/12/2012 Document Reviewed: 01/21/2012 [facility] Patient Information 2014 [place], [place]. Gabapentin capsules or tablets What is this medicine? GABAPENTIN (GA ba pen tin) is used to control partial seizures in adults with epilepsy. It is also used  to treat certain types of nerve pain. This medicine may be used for other purposes; ask your health care provider or pharmacist if you have questions. COMMON BRAND NAME(S): Orpha Bur , Neurontin What should I tell my health care provider before I take this medicine? They need to know if you have any of these conditions: -kidney disease -suicidal thoughts, plans, or attempt; a previous suicide attempt by you or a family member -an unusual or allergic reaction to gabapentin, other medicines, foods, dyes, or preservatives -pregnant or trying to get pregnant -breast-feeding How should I use this medicine? Take this medicine by mouth with a glass of water. Follow the directions on the prescription label. You can take it with or without food. If it upsets your stomach, take it with food.Take your medicine at regular intervals. Do not take it more often than directed. Do not stop taking except on your doctor's advice. If you are directed to break the 600 or 800 mg tablets in half as part of your dose, the extra half tablet should be used for the next dose. If you have not used the extra half tablet within 28 days, it should be thrown away. A special MedGuide will be given to you by the pharmacist with each prescription and refill. Be sure to read this information carefully each time. Talk to your pediatrician regarding the use of this medicine in children. Special care may be needed. Overdosage: If you think you have taken too much of this medicine contact a poison control center or emergency room at once. NOTE: This medicine is only for you. Do not share this medicine with others. What if I miss a dose? If you miss a dose, take it as soon as you can. If it is almost time for your next dose, take only that dose. Do not take double or extra doses. What may interact with this medicine? Do not take this medicine with any of the following medications: -other gabapentin products This medicine may also  interact with the following medications: -alcohol -antacids -antihistamines for allergy, cough and cold -certain medicines for anxiety or sleep -certain medicines for depression or psychotic disturbances -homatropine; hydrocodone -naproxen -narcotic medicines (opiates) for pain -phenothiazines like chlorpromazine, mesoridazine, prochlorperazine, thioridazine This list may not describe all possible interactions. Give your health care provider a list of all the medicines, herbs, non-prescription drugs, or dietary supplements you use. Also tell them if you smoke, drink alcohol, or use illegal drugs. Some items may interact with your medicine. What should I watch for while using this medicine? Visit your doctor or health care professional for regular checks on your progress. You may want to keep a record at home of how you feel your condition is responding  to treatment. You may want to share this information with your doctor or health care professional at each visit. You should contact your doctor or health care professional if your seizures get worse or if you have any new types of seizures. Do not stop taking this medicine or any of your seizure medicines unless instructed by your doctor or health care professional. Stopping your medicine suddenly can increase your seizures or their severity. Wear a medical identification bracelet or chain if you are taking this medicine for seizures, and carry a card that lists all your medications. You may get drowsy, dizzy, or have blurred vision. Do not drive, use machinery, or do anything that needs mental alertness until you know how this medicine affects you. To reduce dizzy or fainting spells, do not sit or stand up quickly, especially if you are an older patient. Alcohol can increase drowsiness and dizziness. Avoid alcoholic drinks. Your mouth may get dry. Chewing sugarless gum or sucking hard candy, and drinking plenty of water will help. The use of this  medicine may increase the chance of suicidal thoughts or actions. Pay special attention to how you are responding while on this medicine. Any worsening of mood, or thoughts of suicide or dying should be reported to your health care professional right away. Women who become pregnant while using this medicine may enroll in the [place] Pregnancy Registry by calling [phone]. This registry collects information about the safety of antiepileptic drug use during pregnancy. What side effects may I notice from receiving this medicine? Side effects that you should report to your doctor or health care professional as soon as possible: -allergic reactions like skin rash, itching or hives, swelling of the face, lips, or tongue -worsening of mood, thoughts or actions of suicide or dying Side effects that usually do not require medical attention (report to your doctor or health care professional if they continue or are bothersome): -constipation -difficulty walking or controlling muscle movements -dizziness -nausea -slurred speech -tiredness -tremors -weight gain This list may not describe all possible side effects. Call your doctor for medical advice about side effects. You may report side effects to FDA at 1-800-FDA-1088. Where should I keep my medicine? Keep out of reach of children. Store at room temperature between 15 and 30 degrees C (59 and 86 degrees F). Throw away any unused medicine after the expiration date. NOTE: This sheet is a summary. It may not cover all possible information. If you have questions about this medicine, talk to your doctor, pharmacist, or health care provider.  2014, Elsevier/Gold Standard. (2013-06-15 09:12:48)

## 2014-02-15 NOTE — Assessment & Plan Note (Addendum)
Due for pap smear she will resch with am appt

## 2014-02-15 NOTE — Assessment & Plan Note (Addendum)
Lab Results  Component Value Date   HGBA1C 7.2 02/15/2014   HGBA1C 7.7 11/16/2013   HGBA1C 10.8 08/17/2013     Assessment: Diabetes control: fair control Progress toward A1C goal:  improved (pending HA1C today ) Comments:none  Plan: Medications:  continue current medications Home glucose monitoring: Frequency: 3 times a day Timing: before meals Instruction/counseling given: reminded to bring blood glucose meter & log to each visit, reminded to bring medications to each visit and provided printed educational material Educational resources provided: other (see comments) Self management tools provided: other (see comments) Other plans: Will check urine microAlb/Cr today , will check HA1C today. F/u in 3 months, eye exam pt will sch with Dr. Nicki Reaper

## 2014-02-15 NOTE — Progress Notes (Signed)
   Subjective:    Patient ID: Kristy [name], female    DOB: Apr 05, 1956, 58 y.o.   MRN: [id number]  HPI Comments: 58 y.o female PMH uncontrolled DM2 (HA1C  10/2013 7.7 improved), RAS s/p stent (follows with Dr. Talbot Grumbling), HTN (BP 159/94), dyslipidemia (LDL 97 10/2013), chronic knee pain (right knee s/p replacement per pt by Dr. 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  She presents for f/u  1) DM 2-She is taking Metformin 500 mg bid and Lantus 35 units qd. Will check urine micro/Alb/Creatinine, HA1C today. She has pain in both feet which is like pens and needles. Pain is 8/10 2) HTN-Creatinine elevated last 10/2013 1.35 with BL ~1.0-1.1 will repeat BMET today. BP 159/94 then 152/96.  She takes her BP meds at night b/c she feels tired if she takes them during the day. She has taken Coreg today but no other BP medications   HM: due for pap smear declines today will come back in the am appt, eye exam will sch with Ilona Sorrel OD.       Review of Systems  Respiratory: Negative for shortness of breath.   Cardiovascular: Negative for chest pain.  Gastrointestinal: Negative for constipation.       Objective:   Physical Exam  Nursing note and vitals reviewed. Constitutional: She is oriented to person, place, and time. She appears well-developed and well-nourished. She is cooperative. No distress.  HENT:  Head: Normocephalic and atraumatic.  Mouth/Throat: Oropharynx is clear and moist and mucous membranes are normal. No oropharyngeal exudate.  Eyes: Conjunctivae are normal. Pupils are equal, round, and reactive to light. Right eye exhibits no discharge. Left eye exhibits no discharge. No scleral icterus.  Cardiovascular: Normal rate, regular rhythm, S1 normal, S2 normal and normal heart sounds.   No murmur  heard. Trace lower ext edema  Pulmonary/Chest: Effort normal and breath sounds normal. No respiratory distress. She has no wheezes.  Abdominal: Soft. Bowel sounds are normal. There is no tenderness.  Musculoskeletal:  Scar to right knee from previous surgery   Neurological: She is alert and oriented to person, place, and time. Gait normal.  Skin: Skin is warm, dry and intact. No rash noted. She is not diaphoretic.  Psychiatric: She has a normal mood and affect. Her speech is normal and behavior is normal. Judgment and thought content normal. Cognition and memory are normal.          Assessment & Plan:  F/u in 3 months

## 2014-02-15 NOTE — Assessment & Plan Note (Addendum)
BP Readings from Last 3 Encounters:  02/15/14 159/94  11/16/13 155/87  08/17/13 151/96    Lab Results  Component Value Date   NA 141 11/16/2013   K 4.4 11/16/2013   CREATININE 1.35* 11/16/2013    Assessment: Blood pressure control: moderately elevated Progress toward BP goal:  unchanged Comments: slightly elevated   Plan: Medications:  continue current medications (Norvasc 10, Lotension 40, Coreg 25 mg bid, Lasix 80 (1.5 tab daily), Hytrin 1 mg qhs  Educational resources provided: other (see comments) Self management tools provided: other (see comments) Other plans: will check BMET today

## 2014-02-15 NOTE — Assessment & Plan Note (Signed)
Rx refill Tramadol today

## 2014-02-16 ENCOUNTER — Encounter: Payer: Self-pay | Admitting: Internal Medicine

## 2014-02-16 LAB — MICROALBUMIN / CREATININE URINE RATIO
Creatinine, Urine: 272.3 mg/dL
MICROALB/CREAT RATIO: 157.7 mg/g — AB (ref 0.0–30.0)
Microalb, Ur: 42.93 mg/dL — ABNORMAL HIGH (ref 0.00–1.89)

## 2014-02-18 NOTE — Progress Notes (Signed)
Case discussed with Dr. McLean soon after the resident saw the patient.  We reviewed the resident's history and exam and pertinent patient test results.  I agree with the assessment, diagnosis, and plan of care documented in the resident's note. 

## 2014-03-05 ENCOUNTER — Other Ambulatory Visit: Payer: Self-pay | Admitting: Internal Medicine

## 2014-03-14 ENCOUNTER — Other Ambulatory Visit: Payer: Self-pay | Admitting: *Deleted

## 2014-03-14 ENCOUNTER — Other Ambulatory Visit: Payer: Self-pay | Admitting: Internal Medicine

## 2014-03-14 DIAGNOSIS — M25561 Pain in right knee: Secondary | ICD-10-CM

## 2014-03-14 MED ORDER — TRAMADOL HCL 50 MG PO TABS: 50.0000 mg | ORAL_TABLET | Freq: Three times a day (TID) | ORAL | Status: DC | PRN

## 2014-03-14 NOTE — Telephone Encounter (Signed)
Pt calls and requests refill of tramadol but wants amount increased to #180, states she is taking 6 daily due to her knee pain, please advise

## 2014-03-15 NOTE — Progress Notes (Unsigned)
Rx called in to pharmacy. Hilda Blades Jailah Willis RN 03/15/14 9:30AM

## 2014-03-20 NOTE — Telephone Encounter (Signed)
done

## 2014-04-02 ENCOUNTER — Other Ambulatory Visit: Payer: Self-pay | Admitting: *Deleted

## 2014-04-02 MED ORDER — BENAZEPRIL HCL 40 MG PO TABS: 40.0000 mg | ORAL_TABLET | Freq: Every day | ORAL | Status: DC

## 2014-04-02 NOTE — Telephone Encounter (Signed)
Fax from [facility] -requesting 90 day supply.

## 2014-04-11 ENCOUNTER — Other Ambulatory Visit: Payer: Self-pay | Admitting: Internal Medicine

## 2014-04-11 DIAGNOSIS — E785 Hyperlipidemia, unspecified: Secondary | ICD-10-CM

## 2014-04-11 MED ORDER — BENAZEPRIL HCL 40 MG PO TABS: 40.0000 mg | ORAL_TABLET | Freq: Every day | ORAL | Status: DC

## 2014-04-11 MED ORDER — PRAVASTATIN SODIUM 80 MG PO TABS: ORAL_TABLET | ORAL | Status: DC

## 2014-04-13 ENCOUNTER — Other Ambulatory Visit: Payer: Self-pay | Admitting: Internal Medicine

## 2014-04-26 ENCOUNTER — Other Ambulatory Visit: Payer: Self-pay | Admitting: *Deleted

## 2014-04-26 DIAGNOSIS — M25561 Pain in right knee: Secondary | ICD-10-CM

## 2014-04-26 MED ORDER — TRAMADOL HCL 50 MG PO TABS: 50.0000 mg | ORAL_TABLET | Freq: Three times a day (TID) | ORAL | Status: DC | PRN

## 2014-04-30 NOTE — Telephone Encounter (Signed)
Rx called in 

## 2014-05-10 ENCOUNTER — Encounter: Payer: Self-pay | Admitting: Internal Medicine

## 2014-05-10 ENCOUNTER — Ambulatory Visit (INDEPENDENT_AMBULATORY_CARE_PROVIDER_SITE_OTHER): Payer: Medicare Other | Admitting: Internal Medicine

## 2014-05-10 VITALS — BP 154/98 | HR 74 | Temp 97.3°F | Ht 62.5 in | Wt 223.1 lb

## 2014-05-10 DIAGNOSIS — I10 Essential (primary) hypertension: Secondary | ICD-10-CM

## 2014-05-10 DIAGNOSIS — Z0000 Encounter for general adult medical examination without abnormal findings: Secondary | ICD-10-CM

## 2014-05-10 DIAGNOSIS — Z23 Encounter for immunization: Secondary | ICD-10-CM

## 2014-05-10 DIAGNOSIS — E1149 Type 2 diabetes mellitus with other diabetic neurological complication: Secondary | ICD-10-CM

## 2014-05-10 LAB — POCT GLYCOSYLATED HEMOGLOBIN (HGB A1C): Hemoglobin A1C: 7.8

## 2014-05-10 LAB — GLUCOSE, CAPILLARY: Glucose-Capillary: 215 mg/dL — ABNORMAL HIGH (ref 70–99)

## 2014-05-10 MED ORDER — PNEUMOCOCCAL VAC POLYVALENT 25 MCG/0.5ML IJ INJ
0.5000 mL | INJECTION | Freq: Once | INTRAMUSCULAR | Status: AC
  Administered 2014-05-10: 0.5 mL via INTRAMUSCULAR

## 2014-05-10 MED ORDER — TERAZOSIN HCL 2 MG PO CAPS: ORAL_CAPSULE | ORAL | Status: DC

## 2014-05-10 NOTE — Progress Notes (Signed)
   Subjective:    Patient ID: Kristy [name], female    DOB: [birth year], 58 y.o.   MRN: [id number]  HPI Comments: 58 y.o PMH DM 2 with neuropathy, HTN, renal artery stenosis s/p ballon angioplasty  She presents for f/u 1)DM 2 -HA1C 7.8 cbg 215. She reports compliance with insulin 35 units, metformin 500 mg bid (she cant tolerate higher doses due to abdominal pain).  Neurontin is helping with neuropathy 2) HTN-BP unchanged 159/94 then 154/98. She brings Lotension 40 mg qd, Norvasc 10 mg , Lasix 120 mg qd, Hytrin 1 mg qhs, Coreg 25 mg bid.  She has not taking Lasix or Hytrin today as of yet  Otherwise feeling good denies complaints. Wants handicap sticker filled out today  HM: due for pap smear will do at f/u.  Eye MD appt scheduled for 05/28/14, will give pneumonia vaccin today  SH: stressed out living with 7 family members.  Denies sex in 5 years.       Review of Systems  Respiratory: Negative for shortness of breath.   Cardiovascular: Negative for chest pain.  Gastrointestinal: Negative for abdominal pain.       Objective:   Physical Exam  Nursing note and vitals reviewed. Constitutional: She is oriented to person, place, and time. She appears well-developed and well-nourished. She is cooperative. No distress.  HENT:  Head: Normocephalic and atraumatic.  Mouth/Throat: Oropharynx is clear and moist and mucous membranes are normal. No oropharyngeal exudate.  Eyes: Conjunctivae are normal. Pupils are equal, round, and reactive to light. Right eye exhibits no discharge. Left eye exhibits no discharge. No scleral icterus.  Cardiovascular: Normal rate, regular rhythm, S1 normal, S2 normal and normal heart sounds.   No murmur heard. 1+ lower ext edema b/l   Pulmonary/Chest: Effort normal and breath sounds normal. No respiratory distress. She has no wheezes.  Abdominal: Soft. Bowel sounds are normal. There is no tenderness.  Obese   Neurological: She is alert and oriented to person,  place, and time. Gait normal.  Skin: Skin is warm, dry and intact. No rash noted. She is not diaphoretic.  Psychiatric: She has a normal mood and affect. Her speech is normal and behavior is normal. Judgment and thought content normal. Cognition and memory are normal.          Assessment & Plan:  F/u in 3 months @ f/u do pap smear

## 2014-05-10 NOTE — Assessment & Plan Note (Signed)
Lab Results  Component Value Date   HGBA1C 7.8 05/10/2014   HGBA1C 7.2 02/15/2014   HGBA1C 7.7 11/16/2013     Assessment: Diabetes control: fair control Progress toward A1C goal:  deteriorated Comments: encouraged medication compliance   Plan: Medications:  continue current medications (Lantus 35 units qd), Metformin 500 mg bid. She cant tolerate higher doses due to ab pain Home glucose monitoring: Frequency: 3 times a day Timing: before meals Instruction/counseling given: reminded to get eye exam, reminded to bring blood glucose meter & log to each visit and reminded to bring medications to each visit Other plans: f/u in 3 months

## 2014-05-10 NOTE — Assessment & Plan Note (Addendum)
Given pneumonia vx today. Will do pap smear at f/u  F/u with Dr. Nicki Reaper eye MD 05/28/14

## 2014-05-10 NOTE — Patient Instructions (Addendum)
General Instructions: Please follow up in 3 months, sooner if needed Take all medications as prescribed. Increase Hytrin 1 mg to 2 mg at night I will let you know about your urine test  Try to eat healthy Take care     Treatment Goals:  Goals (1 Years of Data) as of 05/10/14         As of Today 02/15/14 02/15/14 11/16/13 11/16/13     Blood Pressure    . Blood Pressure < 140/90  159/94 152/96 159/94 155/87 146/88     Result Component    . HEMOGLOBIN A1C < 7.0  7.8 7.2  7.7       Progress Toward Treatment Goals:  Treatment Goal 05/10/2014  Hemoglobin A1C deteriorated  Blood pressure unchanged  Prevent falls -    Self Care Goals & Plans:  Self Care Goal 05/10/2014  Manage my medications take my medicines as prescribed; bring my medications to every visit; refill my medications on time  Monitor my health keep track of my blood glucose; bring my glucose meter and log to each visit; keep track of my blood pressure; bring my blood pressure log to each visit; keep track of my weight; check my feet daily  Eat healthy foods drink diet soda or water instead of juice or soda; eat more vegetables; eat foods that are low in salt; eat fruit for snacks and desserts; eat baked foods instead of fried foods; eat smaller portions  Be physically active find an activity I enjoy  Prevent falls -  Meeting treatment goals maintain the current self-care plan    Home Blood Glucose Monitoring 05/10/2014  Check my blood sugar 3 times a day  When to check my blood sugar before meals     Care Management & Community Referrals:  Referral 05/10/2014  Referrals made for care management support -  Referrals made to community resources none        Hypertension Hypertension, commonly called high blood pressure, is when the force of blood pumping through your arteries is too strong. Your arteries are the blood vessels that carry blood from your heart throughout your body. A blood pressure reading  consists of a higher number over a lower number, such as 110/72. The higher number (systolic) is the pressure inside your arteries when your heart pumps. The lower number (diastolic) is the pressure inside your arteries when your heart relaxes. Ideally you want your blood pressure below 120/80. Hypertension forces your heart to work harder to pump blood. Your arteries may become narrow or stiff. Having hypertension puts you at risk for heart disease, stroke, and other problems.  RISK FACTORS Some risk factors for high blood pressure are controllable. Others are not.  Risk factors you cannot control include:   Race. You may be at higher risk if you are African American.  Age. Risk increases with age.  Gender. Men are at higher risk than women before age 65 years. After age 56, women are at higher risk than men. Risk factors you can control include:  Not getting enough exercise or physical activity.  Being overweight.  Getting too much fat, sugar, calories, or salt in your diet.  Drinking too much alcohol. SIGNS AND SYMPTOMS Hypertension does not usually cause signs or symptoms. Extremely high blood pressure (hypertensive crisis) may cause headache, anxiety, shortness of breath, and nosebleed. DIAGNOSIS  To check if you have hypertension, your health care provider will measure your blood pressure while you are seated, with your arm  held at the level of your heart. It should be measured at least twice using the same arm. Certain conditions can cause a difference in blood pressure between your right and left arms. A blood pressure reading that is higher than normal on one occasion does not mean that you need treatment. If one blood pressure reading is high, ask your health care provider about having it checked again. TREATMENT  Treating high blood pressure includes making lifestyle changes and possibly taking medication. Living a healthy lifestyle can help lower high blood pressure. You may need  to change some of your habits. Lifestyle changes may include:  Following the DASH diet. This diet is high in fruits, vegetables, and whole grains. It is low in salt, red meat, and added sugars.  Getting at least 2 1/2 hours of brisk physical activity every week.  Losing weight if necessary.  Not smoking.  Limiting alcoholic beverages.  Learning ways to reduce stress. If lifestyle changes are not enough to get your blood pressure under control, your health care provider may prescribe medicine. You may need to take more than one. Work closely with your health care provider to understand the risks and benefits. HOME CARE INSTRUCTIONS  Have your blood pressure rechecked as directed by your health care provider.   Only take medicine as directed by your health care provider. Follow the directions carefully. Blood pressure medicines must be taken as prescribed. The medicine does not work as well when you skip doses. Skipping doses also puts you at risk for problems.   Do not smoke.   Monitor your blood pressure at home as directed by your health care provider. SEEK MEDICAL CARE IF:   You think you are having a reaction to medicines taken.  You have recurrent headaches or feel dizzy.  You have swelling in your ankles.  You have trouble with your vision. SEEK IMMEDIATE MEDICAL CARE IF:  You develop a severe headache or confusion.  You have unusual weakness, numbness, or feel faint.  You have severe chest or abdominal pain.  You vomit repeatedly.  You have trouble breathing. MAKE SURE YOU:   Understand these instructions.  Will watch your condition.  Will get help right away if you are not doing well or get worse. Document Released: 10/12/2005 Document Revised: 10/17/2013 Document Reviewed: 08/04/2013 [facility] Patient Information 2015 [place], [place]. This information is not intended to replace advice given to you by your health care provider. Make sure you discuss any  questions you have with your health care provider.

## 2014-05-10 NOTE — Assessment & Plan Note (Addendum)
BP Readings from Last 3 Encounters:  05/10/14 154/98  02/15/14 152/96  11/16/13 155/87    Lab Results  Component Value Date   NA 138 02/15/2014   K 4.3 02/15/2014   CREATININE 0.98 02/15/2014    Assessment: Blood pressure control: moderately elevated Progress toward BP goal:  unchanged Comments: BP consistently elevated though pt on 5 BP meds ? Compliance though she brings in med bottles today and appear she is taking.  She does take Hytrin at night and has not taken Lasix dose today  Plan: Medications:  continue current medications (Norvasc 10, Lotensin 40, Coreg 25 mg bid, Lasix 120 mg qd, Hytrin 1 mg qhs will increase to 2 mg qhs) Educational resources provided: yes Self management tools provided: other (see comments) Other plans: consider referral to [facility].   Concerned uncontrolled BP with h/o RAS could be causing persistent proteinuria.  Will need to get BP under control. Will recheck UA, urine microAlb/Creatinine today.  Consider referral to [facility]

## 2014-05-11 LAB — MICROALBUMIN / CREATININE URINE RATIO
Creatinine, Urine: 200.3 mg/dL
Microalb Creat Ratio: 136.7 mg/g — ABNORMAL HIGH (ref 0.0–30.0)
Microalb, Ur: 27.38 mg/dL — ABNORMAL HIGH (ref 0.00–1.89)

## 2014-05-11 LAB — URINALYSIS, ROUTINE W REFLEX MICROSCOPIC
Bilirubin Urine: NEGATIVE
Hgb urine dipstick: NEGATIVE
KETONES UR: NEGATIVE mg/dL
LEUKOCYTES UA: NEGATIVE
NITRITE: NEGATIVE
PH: 6 (ref 5.0–8.0)
Protein, ur: 100 mg/dL — AB
Specific Gravity, Urine: >1.03 — ABNORMAL HIGH (ref 1.005–1.030)
Urobilinogen, UA: 1 mg/dL (ref 0.0–1.0)

## 2014-05-11 LAB — URINALYSIS, MICROSCOPIC ONLY
Bacteria, UA: NONE SEEN
CASTS: NONE SEEN
Crystals: NONE SEEN

## 2014-05-11 NOTE — Progress Notes (Signed)
INTERNAL MEDICINE TEACHING ATTENDING ADDENDUM - Armilda Vanderlinden, MD: I reviewed and discussed at the time of visit with the resident Dr. McLean, the patient's medical history, physical examination, diagnosis and results of pertinent tests and treatment and I agree with the patient's care as documented.  

## 2014-05-14 ENCOUNTER — Encounter: Payer: Self-pay | Admitting: Internal Medicine

## 2014-05-22 ENCOUNTER — Encounter: Payer: Self-pay | Admitting: Internal Medicine

## 2014-05-22 ENCOUNTER — Telehealth: Payer: Self-pay | Admitting: *Deleted

## 2014-05-22 ENCOUNTER — Other Ambulatory Visit: Payer: Self-pay | Admitting: *Deleted

## 2014-05-22 DIAGNOSIS — M25561 Pain in right knee: Secondary | ICD-10-CM

## 2014-05-22 MED ORDER — CARVEDILOL 25 MG PO TABS: ORAL_TABLET | ORAL | Status: DC

## 2014-05-22 MED ORDER — TRAMADOL HCL 50 MG PO TABS: 50.0000 mg | ORAL_TABLET | Freq: Three times a day (TID) | ORAL | Status: DC | PRN

## 2014-05-22 NOTE — Telephone Encounter (Signed)
When attempting to call the tramadol in pharmacist stated there was a script for 180 of tramadol on hold and that pt gets 180 every month. They are going to fill the one on hold, it was from you on 7/6 and wanted me to verify that the new script should be #90 or # 180? Please review and advise, thanks, h.

## 2014-05-28 ENCOUNTER — Other Ambulatory Visit: Payer: Self-pay | Admitting: Internal Medicine

## 2014-06-20 ENCOUNTER — Encounter: Payer: Self-pay | Admitting: Internal Medicine

## 2014-06-21 ENCOUNTER — Other Ambulatory Visit: Payer: Self-pay | Admitting: Internal Medicine

## 2014-06-22 ENCOUNTER — Other Ambulatory Visit: Payer: Self-pay | Admitting: *Deleted

## 2014-06-25 MED ORDER — BENAZEPRIL HCL 40 MG PO TABS: 40.0000 mg | ORAL_TABLET | Freq: Every day | ORAL | Status: DC

## 2014-07-11 ENCOUNTER — Encounter: Payer: Self-pay | Admitting: Pharmacist

## 2014-07-11 ENCOUNTER — Other Ambulatory Visit: Payer: Self-pay | Admitting: Internal Medicine

## 2014-07-11 ENCOUNTER — Telehealth: Payer: Self-pay | Admitting: Pharmacist

## 2014-07-11 DIAGNOSIS — I10 Essential (primary) hypertension: Secondary | ICD-10-CM

## 2014-07-11 MED ORDER — AMLODIPINE BESYLATE 10 MG PO TABS: 10.0000 mg | ORAL_TABLET | Freq: Every day | ORAL | Status: DC

## 2014-07-17 ENCOUNTER — Other Ambulatory Visit: Payer: Self-pay | Admitting: *Deleted

## 2014-07-17 DIAGNOSIS — M25561 Pain in right knee: Secondary | ICD-10-CM

## 2014-07-17 MED ORDER — TRAMADOL HCL 50 MG PO TABS: 50.0000 mg | ORAL_TABLET | Freq: Three times a day (TID) | ORAL | Status: DC | PRN

## 2014-07-17 NOTE — Telephone Encounter (Signed)
Tramadol rx called to [facility]. 

## 2014-07-31 ENCOUNTER — Ambulatory Visit: Payer: Self-pay | Admitting: Pharmacist

## 2014-07-31 NOTE — Telephone Encounter (Signed)
Medications reviewed and updated.

## 2014-08-09 ENCOUNTER — Encounter: Payer: Self-pay | Admitting: Internal Medicine

## 2014-08-09 ENCOUNTER — Ambulatory Visit (INDEPENDENT_AMBULATORY_CARE_PROVIDER_SITE_OTHER): Payer: Medicare Other | Admitting: Internal Medicine

## 2014-08-09 ENCOUNTER — Telehealth: Payer: Self-pay | Admitting: *Deleted

## 2014-08-09 ENCOUNTER — Ambulatory Visit: Payer: Medicare Other | Admitting: Pharmacist

## 2014-08-09 VITALS — BP 155/104 | HR 85 | Temp 97.9°F | Wt 219.9 lb

## 2014-08-09 DIAGNOSIS — M791 Myalgia, unspecified site: Secondary | ICD-10-CM | POA: Insufficient documentation

## 2014-08-09 DIAGNOSIS — M25572 Pain in left ankle and joints of left foot: Secondary | ICD-10-CM

## 2014-08-09 DIAGNOSIS — G8929 Other chronic pain: Secondary | ICD-10-CM

## 2014-08-09 DIAGNOSIS — Z0000 Encounter for general adult medical examination without abnormal findings: Secondary | ICD-10-CM

## 2014-08-09 DIAGNOSIS — I10 Essential (primary) hypertension: Secondary | ICD-10-CM

## 2014-08-09 DIAGNOSIS — Z23 Encounter for immunization: Secondary | ICD-10-CM

## 2014-08-09 DIAGNOSIS — F329 Major depressive disorder, single episode, unspecified: Secondary | ICD-10-CM

## 2014-08-09 DIAGNOSIS — Z79899 Other long term (current) drug therapy: Secondary | ICD-10-CM

## 2014-08-09 DIAGNOSIS — F321 Major depressive disorder, single episode, moderate: Secondary | ICD-10-CM | POA: Insufficient documentation

## 2014-08-09 DIAGNOSIS — M25569 Pain in unspecified knee: Secondary | ICD-10-CM

## 2014-08-09 DIAGNOSIS — E1149 Type 2 diabetes mellitus with other diabetic neurological complication: Secondary | ICD-10-CM

## 2014-08-09 DIAGNOSIS — F32A Depression, unspecified: Secondary | ICD-10-CM

## 2014-08-09 LAB — CBC WITH DIFFERENTIAL/PLATELET
Basophils Absolute: 0.1 10*3/uL (ref 0.0–0.1)
Basophils Relative: 1 % (ref 0–1)
EOS PCT: 1 % (ref 0–5)
Eosinophils Absolute: 0.1 10*3/uL (ref 0.0–0.7)
HCT: 44.9 % (ref 36.0–46.0)
Hemoglobin: 15.7 g/dL — ABNORMAL HIGH (ref 12.0–15.0)
LYMPHS ABS: 2 10*3/uL (ref 0.7–4.0)
LYMPHS PCT: 26 % (ref 12–46)
MCH: 26.7 pg (ref 26.0–34.0)
MCHC: 35 g/dL (ref 30.0–36.0)
MCV: 76.2 fL — AB (ref 78.0–100.0)
Monocytes Absolute: 0.5 10*3/uL (ref 0.1–1.0)
Monocytes Relative: 6 % (ref 3–12)
NEUTROS PCT: 66 % (ref 43–77)
Neutro Abs: 5.1 10*3/uL (ref 1.7–7.7)
PLATELETS: 232 10*3/uL (ref 150–400)
RBC: 5.89 MIL/uL — ABNORMAL HIGH (ref 3.87–5.11)
RDW: 15.8 % — ABNORMAL HIGH (ref 11.5–15.5)
WBC: 7.8 10*3/uL (ref 4.0–10.5)

## 2014-08-09 LAB — BASIC METABOLIC PANEL WITH GFR
BUN: 13 mg/dL (ref 6–23)
CHLORIDE: 105 meq/L (ref 96–112)
CO2: 27 mEq/L (ref 19–32)
CREATININE: 1.02 mg/dL (ref 0.50–1.10)
Calcium: 9.7 mg/dL (ref 8.4–10.5)
GFR, Est African American: 70 mL/min
GFR, Est Non African American: 61 mL/min
Glucose, Bld: 211 mg/dL — ABNORMAL HIGH (ref 70–99)
Potassium: 4 mEq/L (ref 3.5–5.3)
Sodium: 137 mEq/L (ref 135–145)

## 2014-08-09 LAB — POCT GLYCOSYLATED HEMOGLOBIN (HGB A1C): Hemoglobin A1C: 9.7

## 2014-08-09 LAB — TSH: TSH: 1.241 u[IU]/mL (ref 0.350–4.500)

## 2014-08-09 LAB — GLUCOSE, CAPILLARY: Glucose-Capillary: 187 mg/dL — ABNORMAL HIGH (ref 70–99)

## 2014-08-09 MED ORDER — TRAMADOL HCL 50 MG PO TABS: 50.0000 mg | ORAL_TABLET | Freq: Four times a day (QID) | ORAL | Status: DC | PRN

## 2014-08-09 MED ORDER — INSULIN GLARGINE 100 UNIT/ML ~~LOC~~ SOLN: SUBCUTANEOUS | Status: DC

## 2014-08-09 NOTE — Assessment & Plan Note (Addendum)
Will give flu today, DM foot exam, HA1C, will do pap at f/u, mammo 12/4 Will get copy of eye exam Dr. Nicki Reaper  Will check CBC today

## 2014-08-09 NOTE — Telephone Encounter (Signed)
Message copied by Ebbie Latus on Thu Aug 09, 2014  4:11 PM ------      Message from: Cresenciano Genre      Created: Thu Aug 09, 2014 11:42 AM       pls phone in Tramadol             Thanks       tM ------

## 2014-08-09 NOTE — Assessment & Plan Note (Addendum)
BP Readings from Last 3 Encounters:  08/09/14 155/104  05/10/14 154/98  02/15/14 152/96    Lab Results  Component Value Date   NA 138 02/15/2014   K 4.3 02/15/2014   CREATININE 0.98 02/15/2014    Assessment: Blood pressure control: moderately elevated Progress toward BP goal:  deteriorated Comments: initial 170/96 w/o meds today repeat improved   Plan: Medications:  continue current medications (see HPI) Other plans: f/u in 2-3 months check BMET today

## 2014-08-09 NOTE — Patient Instructions (Addendum)
General Instructions: Please increase your lantus to 40 units daily  Bring meter to f/u  We will check blood work today  You can take Tramadol every 6 hours as needed  Follow up in 2-3 months    Treatment Goals:  Goals (1 Years of Data) as of 08/09/14         As of Today 05/10/14 05/10/14 02/15/14 02/15/14     Blood Pressure    . Blood Pressure < 140/90  170/96 154/98 159/94 152/96 159/94     Result Component    . HEMOGLOBIN A1C < 7.0  9.7 7.8  7.2       Progress Toward Treatment Goals:  Treatment Goal 08/09/2014  Hemoglobin A1C deteriorated  Blood pressure deteriorated  Prevent falls -    Self Care Goals & Plans:  Self Care Goal 08/09/2014  Manage my medications take my medicines as prescribed; refill my medications on time; bring my medications to every visit  Monitor my health check my feet daily; keep track of my blood glucose; bring my glucose meter and log to each visit; keep track of my blood pressure; bring my blood pressure log to each visit; keep track of my weight  Eat healthy foods eat more vegetables; eat foods that are low in salt; eat baked foods instead of fried foods; drink diet soda or water instead of juice or soda; eat fruit for snacks and desserts; eat smaller portions  Be physically active find an activity I enjoy  Prevent falls -  Meeting treatment goals maintain the current self-care plan    Home Blood Glucose Monitoring 08/09/2014  Check my blood sugar 3 times a day  When to check my blood sugar before meals     Care Management & Community Referrals:  Referral 08/09/2014  Referrals made for care management support none needed  Referrals made to community resources none     Type 2 Diabetes Mellitus Type 2 diabetes mellitus, often simply referred to as type 2 diabetes, is a long-lasting (chronic) disease. In type 2 diabetes, the pancreas does not make enough insulin (a hormone), the cells are less responsive to the insulin that is made (insulin  resistance), or both. Normally, insulin moves sugars from food into the tissue cells. The tissue cells use the sugars for energy. The lack of insulin or the lack of normal response to insulin causes excess sugars to build up in the blood instead of going into the tissue cells. As a result, high blood sugar (hyperglycemia) develops. The effect of high sugar (glucose) levels can cause many complications. Type 2 diabetes was also previously called adult-onset diabetes, but it can occur at any age.  RISK FACTORS  A person is predisposed to developing type 2 diabetes if someone in the family has the disease and also has one or more of the following primary risk factors:  Overweight.  An inactive lifestyle.  A history of consistently eating high-calorie foods. Maintaining a normal weight and regular physical activity can reduce the chance of developing type 2 diabetes. SYMPTOMS  A person with type 2 diabetes may not show symptoms initially. The symptoms of type 2 diabetes appear slowly. The symptoms include:  Increased thirst (polydipsia).  Increased urination (polyuria).  Increased urination during the night (nocturia).  Weight loss. This weight loss may be rapid.  Frequent, recurring infections.  Tiredness (fatigue).  Weakness.  Vision changes, such as blurred vision.  Fruity smell to your breath.  Abdominal pain.  Nausea or vomiting.  Cuts or bruises which are slow to heal.  Tingling or numbness in the hands or feet. DIAGNOSIS Type 2 diabetes is frequently not diagnosed until complications of diabetes are present. Type 2 diabetes is diagnosed when symptoms or complications are present and when blood glucose levels are increased. Your blood glucose level may be checked by one or more of the following blood tests:  A fasting blood glucose test. You will not be allowed to eat for at least 8 hours before a blood sample is taken.  A random blood glucose test. Your blood glucose  is checked at any time of the day regardless of when you ate.  A hemoglobin A1c blood glucose test. A hemoglobin A1c test provides information about blood glucose control over the previous 3 months.  An oral glucose tolerance test (OGTT). Your blood glucose is measured after you have not eaten (fasted) for 2 hours and then after you drink a glucose-containing beverage. TREATMENT   You may need to take insulin or diabetes medicine daily to keep blood glucose levels in the desired range.  If you use insulin, you may need to adjust the dosage depending on the carbohydrates that you eat with each meal or snack. The treatment goal is to maintain the before meal blood sugar (preprandial glucose) level at 70-130 mg/dL. HOME CARE INSTRUCTIONS   Have your hemoglobin A1c level checked twice a year.  Perform daily blood glucose monitoring as directed by your health care provider.  Monitor urine ketones when you are ill and as directed by your health care provider.  Take your diabetes medicine or insulin as directed by your health care provider to maintain your blood glucose levels in the desired range.  Never run out of diabetes medicine or insulin. It is needed every day.  If you are using insulin, you may need to adjust the amount of insulin given based on your intake of carbohydrates. Carbohydrates can raise blood glucose levels but need to be included in your diet. Carbohydrates provide vitamins, minerals, and fiber which are an essential part of a healthy diet. Carbohydrates are found in fruits, vegetables, whole grains, dairy products, legumes, and foods containing added sugars.  Eat healthy foods. You should make an appointment to see a registered dietitian to help you create an eating plan that is right for you.  Lose weight if you are overweight.  Carry a medical alert card or wear your medical alert jewelry.  Carry a 15-gram carbohydrate snack with you at all times to treat low blood  glucose (hypoglycemia). Some examples of 15-gram carbohydrate snacks include:  Glucose tablets, 3 or 4.  Glucose gel, 15-gram tube.  Raisins, 2 tablespoons (24 grams).  Jelly beans, 6.  Animal crackers, 8.  Regular pop, 4 ounces (120 mL).  Gummy treats, 9.  Recognize hypoglycemia. Hypoglycemia occurs with blood glucose levels of 70 mg/dL and below. The risk for hypoglycemia increases when fasting or skipping meals, during or after intense exercise, and during sleep. Hypoglycemia symptoms can include:  Tremors or shakes.  Decreased ability to concentrate.  Sweating.  Increased heart rate.  Headache.  Dry mouth.  Hunger.  Irritability.  Anxiety.  Restless sleep.  Altered speech or coordination.  Confusion.  Treat hypoglycemia promptly. If you are alert and able to safely swallow, follow the 15:15 rule:  Take 15-20 grams of rapid-acting glucose or carbohydrate. Rapid-acting options include glucose gel, glucose tablets, or 4 ounces (120 mL) of fruit juice, regular soda, or low-fat milk.  Check  your blood glucose level 15 minutes after taking the glucose.  Take 15-20 grams more of glucose if the repeat blood glucose level is still 70 mg/dL or below.  Eat a meal or snack within 1 hour once blood glucose levels return to normal.  Be alert to feeling very thirsty and urinating more frequently than usual, which are early signs of hyperglycemia. An early awareness of hyperglycemia allows for prompt treatment. Treat hyperglycemia as directed by your health care provider.  Engage in at least 150 minutes of moderate-intensity physical activity a week, spread over at least 3 days of the week or as directed by your health care provider. In addition, you should engage in resistance exercise at least 2 times a week or as directed by your health care provider. Try to spend no more than 90 minutes at one time inactive.  Adjust your medicine and food intake as needed if you  start a new exercise or sport.  Follow your sick-day plan anytime you are unable to eat or drink as usual.  Do not use any tobacco products including cigarettes, chewing tobacco, or electronic cigarettes. If you need help quitting, ask your health care provider.  Limit alcohol intake to no more than 1 drink per day for nonpregnant women and 2 drinks per day for men. You should drink alcohol only when you are also eating food. Talk with your health care provider whether alcohol is safe for you. Tell your health care provider if you drink alcohol several times a week.  Keep all follow-up visits as directed by your health care provider. This is important.  Schedule an eye exam soon after the diagnosis of type 2 diabetes and then annually.  Perform daily skin and foot care. Examine your skin and feet daily for cuts, bruises, redness, nail problems, bleeding, blisters, or sores. A foot exam by a health care provider should be done annually.  Brush your teeth and gums at least twice a day and floss at least once a day. Follow up with your dentist regularly.  Share your diabetes management plan with your workplace or school.  Stay up-to-date with immunizations. It is recommended that people with diabetes who are over 12 years old get the pneumonia vaccine. In some cases, two separate shots may be given. Ask your health care provider if your pneumonia vaccination is up-to-date.  Learn to manage stress.  Obtain ongoing diabetes education and support as needed.  Participate in or seek rehabilitation as needed to maintain or improve independence and quality of life. Request a physical or occupational therapy referral if you are having foot or hand numbness, or difficulties with grooming, dressing, eating, or physical activity. SEEK MEDICAL CARE IF:   You are unable to eat food or drink fluids for more than 6 hours.  You have nausea and vomiting for more than 6 hours.  Your blood glucose level is  over 240 mg/dL.  There is a change in mental status.  You develop an additional serious illness.  You have diarrhea for more than 6 hours.  You have been sick or have had a fever for a couple of days and are not getting better.  You have pain during any physical activity.  SEEK IMMEDIATE MEDICAL CARE IF:  You have difficulty breathing.  You have moderate to large ketone levels. MAKE SURE YOU:  Understand these instructions.  Will watch your condition.  Will get help right away if you are not doing well or get worse. Document Released:  10/12/2005 Document Revised: 02/26/2014 Document Reviewed: 05/10/2012 ExitCare Patient Information 2015 [place], [place]. This information is not intended to replace advice given to you by your health care provider. Make sure you discuss any questions you have with your health care provider.   Depression Depression refers to feeling sad, low, down in the dumps, blue, gloomy, or empty. In general, there are two kinds of depression: 1. Normal sadness or normal grief. This kind of depression is one that we all feel from time to time after upsetting life experiences, such as the loss of a job or the ending of a relationship. This kind of depression is considered normal, is short lived, and resolves within a few days to 2 weeks. Depression experienced after the loss of a loved one (bereavement) often lasts longer than 2 weeks but normally gets better with time. 2. Clinical depression. This kind of depression lasts longer than normal sadness or normal grief or interferes with your ability to function at home, at work, and in school. It also interferes with your personal relationships. It affects almost every aspect of your life. Clinical depression is an illness. Symptoms of depression can also be caused by conditions other than those mentioned above, such as:  Physical illness. Some physical illnesses, including underactive thyroid gland (hypothyroidism),  severe anemia, specific types of cancer, diabetes, uncontrolled seizures, heart and lung problems, strokes, and chronic pain are commonly associated with symptoms of depression.  Side effects of some prescription medicine. In some people, certain types of medicine can cause symptoms of depression.  Substance abuse. Abuse of alcohol and illicit drugs can cause symptoms of depression. SYMPTOMS Symptoms of normal sadness and normal grief include the following:  Feeling sad or crying for short periods of time.  Not caring about anything (apathy).  Difficulty sleeping or sleeping too much.  No longer able to enjoy the things you used to enjoy.  Desire to be by oneself all the time (social isolation).  Lack of energy or motivation.  Difficulty concentrating or remembering.  Change in appetite or weight.  Restlessness or agitation. Symptoms of clinical depression include the same symptoms of normal sadness or normal grief and also the following symptoms:  Feeling sad or crying all the time.  Feelings of guilt or worthlessness.  Feelings of hopelessness or helplessness.  Thoughts of suicide or the desire to harm yourself (suicidal ideation).  Loss of touch with reality (psychotic symptoms). Seeing or hearing things that are not real (hallucinations) or having false beliefs about your life or the people around you (delusions and paranoia). DIAGNOSIS  The diagnosis of clinical depression is usually based on how bad the symptoms are and how long they have lasted. Your health care provider will also ask you questions about your medical history and substance use to find out if physical illness, use of prescription medicine, or substance abuse is causing your depression. Your health care provider may also order blood tests. TREATMENT  Often, normal sadness and normal grief do not require treatment. However, sometimes antidepressant medicine is given for bereavement to ease the depressive  symptoms until they resolve. The treatment for clinical depression depends on how bad the symptoms are but often includes antidepressant medicine, counseling with a mental health professional, or both. Your health care provider will help to determine what treatment is best for you. Depression caused by physical illness usually goes away with appropriate medical treatment of the illness. If prescription medicine is causing depression, talk with your health care provider about  stopping the medicine, decreasing the dose, or changing to another medicine. Depression caused by the abuse of alcohol or illicit drugs goes away when you stop using these substances. Some adults need professional help in order to stop drinking or using drugs. SEEK IMMEDIATE MEDICAL CARE IF:  You have thoughts about hurting yourself or others.  You lose touch with reality (have psychotic symptoms).  You are taking medicine for depression and have a serious side effect. FOR MORE INFORMATION  National Alliance on Mental Illness: www.nami.[employer]: https://carter.com/ Document Released: 10/09/2000 Document Revised: 02/26/2014 Document Reviewed: 01/11/2012 [facility] Patient Information 2015 [place], [place]. This information is not intended to replace advice given to you by your health care provider. Make sure you discuss any questions you have with your health care provider.

## 2014-08-09 NOTE — Assessment & Plan Note (Addendum)
Hold statin for now and will see in 2-3 months for f/u  Will check labs for elevated CK due statin, TSH Vitamin D

## 2014-08-09 NOTE — Assessment & Plan Note (Addendum)
Could be due to stressors at home At f/u will evaluate to see if needs SSRI vs Cymbalta

## 2014-08-09 NOTE — Progress Notes (Addendum)
   Subjective:    Patient ID: Kristy [name], female    DOB: 12-08-1955, 57 y.o.   MRN: [id number]  HPI Comments: 58 y.o PMH DM 2 with neuropathy, HTN, renal artery stenosis s/p ballon angioplasty, microalbuminuira    She presents for f/u 1)DM 2 -HA1C 7.8 04/2014 with cbg 187 today. HA1C 9.7 today.  She reports compliance with insulin 35 units, metformin 500 mg bid (she cant tolerate higher doses due to abdominal pain).  Neurontin is not helping with neuropathy 2) HTN-BP uncontrolled today 170/96. She has not taken any BP meds today b/c they make her feel drowsy and she had to drive here. On Lotension 40 mg qd, Norvasc 10 mg , Lasix 120 mg qd, Hytrin 2 mg qhs, Coreg 25 mg bid.   3)C/o left medial ankle pain none today but h/o intermittent pain. Duration x long time at least 6 months since history of trauma months ago. Tramadol helps with pain  4) C/o body aches x a while from shoulders to hands to legs.  Sometimes this wakes her up in the am.   5) She has a lot of stressors at home including husband of >40+ years and states she is depressed.    HM: due for pap smear will do at f/u. Will give flu vaccine today, check HA1C, foot exam today, mammo will get 09/28/14 at [facility], Dr. Nicki Reaper gave pt eye exam 05/2014 will get records  SH: stressed out living with 7 family members in 3 bedroom house and ppl will not cook or clean other than her. Husband gets on her nerves married 40+ years           Review of Systems  Constitutional: Negative for fever and chills.       Lost 4 lbs trying to  Respiratory: Negative for shortness of breath.   Cardiovascular: Negative for chest pain.  Gastrointestinal: Negative for constipation.  Psychiatric/Behavioral: Positive for dysphoric mood. Negative for suicidal ideas.       Denies HI       Objective:   Physical Exam  Nursing note and vitals reviewed. Constitutional: She is oriented to person, place, and time. She appears well-developed and well-nourished.  She is cooperative. No distress.  HENT:  Head: Normocephalic and atraumatic.  Mouth/Throat: No oropharyngeal exudate.  Eyes: Right eye exhibits no discharge. Left eye exhibits no discharge. Right conjunctiva is injected. Left conjunctiva is injected. No scleral icterus.  Cardiovascular: Normal rate, regular rhythm, S1 normal, S2 normal and normal heart sounds.   No murmur heard. No lower ext edema   Pulmonary/Chest: Effort normal and breath sounds normal. No respiratory distress. She has no wheezes.  Abdominal: Soft. Bowel sounds are normal. There is no tenderness.  Musculoskeletal:  Left medial ankle pain no ttp   Neurological: She is alert and oriented to person, place, and time. Gait normal.  Skin: Skin is warm, dry and intact. No rash noted. She is not diaphoretic.  Psychiatric: She has a normal mood and affect. Her speech is normal and behavior is normal. Judgment and thought content normal. Cognition and memory are normal.          Assessment & Plan:  F/u in 2-3 months

## 2014-08-09 NOTE — Telephone Encounter (Signed)
Tramadol rx called to [facility]. 

## 2014-08-09 NOTE — Assessment & Plan Note (Signed)
Rx refill Tramadol today changed to q6 from q8 prn since needing more

## 2014-08-09 NOTE — Assessment & Plan Note (Signed)
Intermittent no pain today could be related to previous trauma Will image in future if bothersome  Continue Tramadol

## 2014-08-09 NOTE — Assessment & Plan Note (Signed)
Will stop Pravastain for now will check CK, TSH, vitamin D, BMET

## 2014-08-09 NOTE — Assessment & Plan Note (Signed)
Lab Results  Component Value Date   HGBA1C 9.7 08/09/2014   HGBA1C 7.8 05/10/2014   HGBA1C 7.2 02/15/2014     Assessment: Diabetes control: poor control (HgbA1C >9%) Progress toward A1C goal:  deteriorated Comments: none reports compliance with medications   Plan: Medications:  continue current medicationswill increase Lantus 35 units to 40 units daily, continue Metformin 500 mg bid unable to tolerate higher dose. Consider meal time coverage if pt brings meter in vs Prandin Home glucose monitoring: Frequency: 3 times a day Timing: before meals Instruction/counseling given: reminded to bring blood glucose meter & log to each visit Other plans:info given

## 2014-08-10 ENCOUNTER — Encounter: Payer: Self-pay | Admitting: Internal Medicine

## 2014-08-10 ENCOUNTER — Other Ambulatory Visit: Payer: Self-pay | Admitting: Internal Medicine

## 2014-08-10 LAB — CK TOTAL AND CKMB (NOT AT ARMC)
CK, MB: <0.7 ng/mL (ref 0.0–5.0)
Total CK: 75 U/L (ref 7–177)

## 2014-08-10 LAB — VITAMIN D 25 HYDROXY (VIT D DEFICIENCY, FRACTURES): Vit D, 25-Hydroxy: 18 ng/mL — ABNORMAL LOW (ref 30–89)

## 2014-08-10 MED ORDER — VITAMIN D (ERGOCALCIFEROL) 1.25 MG (50000 UNIT) PO CAPS: 50000.0000 [IU] | ORAL_CAPSULE | ORAL | Status: DC

## 2014-08-10 NOTE — Progress Notes (Signed)
Case discussed with Dr. Aundra Dubin soon after the resident saw the patient.  We reviewed the resident's history and exam and pertinent patient test results.  I agree with the assessment, diagnosis and plan of care documented in the resident's note.  At follow-up consider more aggressive therapy for her depression.  This may have an impact on her muscle aches and allow for a return of the tramadol dose to Q8H.  Will also consider holding one antihypertensive at a time to figure out which one may be causing the somnolence.  If that can be determined, it can be formally stopped and allow her to consistently take the rest of her antihypertensives.  If her muscle aches persist despite stopping the statin, consideration should be made to restart it at follow-up given her cardiovascular risks, especially if her hypertension and diabetes are not better controlled.

## 2014-08-10 NOTE — Progress Notes (Addendum)
Kristy [name] is a 58 y.o. female who reported to clinical pharmacy for a brief medication review. Patient was referred by [facility] for statin non-adherence    Patient brought in medication bottles and list was updated.  Pain management concerns  Patient reports general aching and fatigue  Patient also states the gabapentin is ineffective at reducing the pain  Recommendations  Work-up for statin intolerance, including vitamin D, TSH, and serum CK  Increase tramadol to 50 mg Q 6 hours PRN  Consider discontinuation of gabapentin   Thank you for including me in the care of your patient.   Flossie Dibble, PharmD Clinical Pharmacist

## 2014-08-13 ENCOUNTER — Other Ambulatory Visit: Payer: Self-pay | Admitting: Internal Medicine

## 2014-09-01 ENCOUNTER — Other Ambulatory Visit: Payer: Self-pay | Admitting: Internal Medicine

## 2014-09-28 ENCOUNTER — Telehealth: Payer: Self-pay | Admitting: Pharmacist

## 2014-09-28 ENCOUNTER — Other Ambulatory Visit: Payer: Self-pay | Admitting: Internal Medicine

## 2014-09-28 DIAGNOSIS — I10 Essential (primary) hypertension: Secondary | ICD-10-CM

## 2014-09-28 MED ORDER — TERAZOSIN HCL 2 MG PO CAPS: ORAL_CAPSULE | ORAL | Status: DC

## 2014-09-28 MED ORDER — FUROSEMIDE 80 MG PO TABS: ORAL_TABLET | ORAL | Status: DC

## 2014-10-01 ENCOUNTER — Telehealth: Payer: Self-pay | Admitting: *Deleted

## 2014-10-01 NOTE — Telephone Encounter (Signed)
Call to patient message was left to call the Clinics.  Patient needs to come in for B/P check and labs for Vitamin D per Dr. Aundra Dubin.  Sander Nephew, RN 10/01/2014 9:15 AM.

## 2014-10-26 HISTORY — PX: BREAST BIOPSY: SHX20

## 2014-11-02 ENCOUNTER — Other Ambulatory Visit: Payer: Self-pay | Admitting: Pharmacist

## 2014-11-02 ENCOUNTER — Telehealth: Payer: Self-pay | Admitting: *Deleted

## 2014-11-02 DIAGNOSIS — M25572 Pain in left ankle and joints of left foot: Secondary | ICD-10-CM

## 2014-11-02 MED ORDER — TRAMADOL HCL 50 MG PO TABS: 50.0000 mg | ORAL_TABLET | Freq: Four times a day (QID) | ORAL | Status: DC | PRN

## 2014-11-02 NOTE — Telephone Encounter (Signed)
Prescription for Tramadol 50 mg tablets 1 po every 6 hours prn for pain dispense # 120 with 1 refill per Dr. Verdia Kuba.  Prescription was called to [facility] and left on Pharmacy line.  Sander Nephew, RN 11/02/2014 12:02 PM.

## 2014-11-26 ENCOUNTER — Other Ambulatory Visit: Payer: Self-pay | Admitting: Pharmacist

## 2014-11-26 DIAGNOSIS — I10 Essential (primary) hypertension: Secondary | ICD-10-CM

## 2014-11-27 MED ORDER — FUROSEMIDE 80 MG PO TABS
ORAL_TABLET | ORAL | Status: DC
Start: 2014-11-27 — End: 2015-08-26

## 2014-12-06 ENCOUNTER — Encounter: Payer: Self-pay | Admitting: Internal Medicine

## 2014-12-06 ENCOUNTER — Ambulatory Visit (INDEPENDENT_AMBULATORY_CARE_PROVIDER_SITE_OTHER): Payer: Medicare Other | Admitting: Internal Medicine

## 2014-12-06 ENCOUNTER — Other Ambulatory Visit: Payer: Self-pay | Admitting: Internal Medicine

## 2014-12-06 VITALS — BP 130/80 | HR 88 | Temp 98.1°F | Ht 63.0 in | Wt 220.6 lb

## 2014-12-06 DIAGNOSIS — M25561 Pain in right knee: Secondary | ICD-10-CM

## 2014-12-06 DIAGNOSIS — E1142 Type 2 diabetes mellitus with diabetic polyneuropathy: Secondary | ICD-10-CM

## 2014-12-06 DIAGNOSIS — F329 Major depressive disorder, single episode, unspecified: Secondary | ICD-10-CM

## 2014-12-06 DIAGNOSIS — I10 Essential (primary) hypertension: Secondary | ICD-10-CM | POA: Diagnosis not present

## 2014-12-06 DIAGNOSIS — Z0000 Encounter for general adult medical examination without abnormal findings: Secondary | ICD-10-CM | POA: Diagnosis not present

## 2014-12-06 DIAGNOSIS — E785 Hyperlipidemia, unspecified: Secondary | ICD-10-CM | POA: Diagnosis not present

## 2014-12-06 DIAGNOSIS — G8929 Other chronic pain: Secondary | ICD-10-CM

## 2014-12-06 DIAGNOSIS — E559 Vitamin D deficiency, unspecified: Secondary | ICD-10-CM

## 2014-12-06 DIAGNOSIS — Z1231 Encounter for screening mammogram for malignant neoplasm of breast: Secondary | ICD-10-CM

## 2014-12-06 DIAGNOSIS — F32A Depression, unspecified: Secondary | ICD-10-CM

## 2014-12-06 LAB — POCT GLYCOSYLATED HEMOGLOBIN (HGB A1C): Hemoglobin A1C: 9.1

## 2014-12-06 LAB — GLUCOSE, CAPILLARY: Glucose-Capillary: 127 mg/dL — ABNORMAL HIGH (ref 70–99)

## 2014-12-06 MED ORDER — REPAGLINIDE 1 MG PO TABS: 1.0000 mg | ORAL_TABLET | Freq: Three times a day (TID) | ORAL | Status: DC

## 2014-12-06 MED ORDER — DULOXETINE HCL 20 MG PO CPEP
40.0000 mg | ORAL_CAPSULE | Freq: Every day | ORAL | Status: DC
Start: 2014-12-06 — End: 2015-04-03

## 2014-12-06 NOTE — Patient Instructions (Addendum)
General Instructions: Please follow up in 1, sooner if needed  Bring your meter at next follow up  Please follow up with [facility]  Increase your insulin to 44 units daily I will let you know about your blood work  Start Cymbalta 40 mg daily Start Prandin 1 mg 3 x per day with meals   Treatment Goals:  Goals (1 Years of Data) as of 12/06/14          As of Today 08/09/14 08/09/14 05/10/14 05/10/14     Blood Pressure   . Blood Pressure < 140/90  165/101 155/104 170/96 154/98 159/94     Result Component   . HEMOGLOBIN A1C < 7.0   9.7  7.8       Progress Toward Treatment Goals:  Treatment Goal 12/06/2014  Hemoglobin A1C unable to assess  Blood pressure at goal  Prevent falls -    Self Care Goals & Plans:  Self Care Goal 12/06/2014  Manage my medications take my medicines as prescribed; bring my medications to every visit; refill my medications on time; follow the sick day instructions if I am sick  Monitor my health keep track of my blood pressure; keep track of my blood glucose; bring my glucose meter and log to each visit; bring my blood pressure log to each visit; keep track of my weight; check my feet daily  Eat healthy foods drink diet soda or water instead of juice or soda; eat more vegetables; eat foods that are low in salt; eat baked foods instead of fried foods; eat fruit for snacks and desserts; eat smaller portions  Be physically active find an activity I enjoy  Prevent falls -  Meeting treatment goals maintain the current self-care plan    Home Blood Glucose Monitoring 12/06/2014  Check my blood sugar 2 times a day  When to check my blood sugar before meals     Care Management & Community Referrals:  Referral 12/06/2014  Referrals made for care management support none needed  Referrals made to community resources none       Hypertension Hypertension, commonly called high blood pressure, is when the force of blood pumping through your arteries is too strong.  Your arteries are the blood vessels that carry blood from your heart throughout your body. A blood pressure reading consists of a higher number over a lower number, such as 110/72. The higher number (systolic) is the pressure inside your arteries when your heart pumps. The lower number (diastolic) is the pressure inside your arteries when your heart relaxes. Ideally you want your blood pressure below 120/80. Hypertension forces your heart to work harder to pump blood. Your arteries may become narrow or stiff. Having hypertension puts you at risk for heart disease, stroke, and other problems.  RISK FACTORS Some risk factors for high blood pressure are controllable. Others are not.  Risk factors you cannot control include:   Race. You may be at higher risk if you are African American.  Age. Risk increases with age.  Gender. Men are at higher risk than women before age 21 years. After age 29, women are at higher risk than men. Risk factors you can control include:  Not getting enough exercise or physical activity.  Being overweight.  Getting too much fat, sugar, calories, or salt in your diet.  Drinking too much alcohol. SIGNS AND SYMPTOMS Hypertension does not usually cause signs or symptoms. Extremely high blood pressure (hypertensive crisis) may cause headache, anxiety, shortness of breath, and  nosebleed. DIAGNOSIS  To check if you have hypertension, your health care provider will measure your blood pressure while you are seated, with your arm held at the level of your heart. It should be measured at least twice using the same arm. Certain conditions can cause a difference in blood pressure between your right and left arms. A blood pressure reading that is higher than normal on one occasion does not mean that you need treatment. If one blood pressure reading is high, ask your health care provider about having it checked again. TREATMENT  Treating high blood pressure includes making lifestyle  changes and possibly taking medicine. Living a healthy lifestyle can help lower high blood pressure. You may need to change some of your habits. Lifestyle changes may include:  Following the DASH diet. This diet is high in fruits, vegetables, and whole grains. It is low in salt, red meat, and added sugars.  Getting at least 2 hours of brisk physical activity every week.  Losing weight if necessary.  Not smoking.  Limiting alcoholic beverages.  Learning ways to reduce stress. If lifestyle changes are not enough to get your blood pressure under control, your health care provider may prescribe medicine. You may need to take more than one. Work closely with your health care provider to understand the risks and benefits. HOME CARE INSTRUCTIONS  Have your blood pressure rechecked as directed by your health care provider.   Take medicines only as directed by your health care provider. Follow the directions carefully. Blood pressure medicines must be taken as prescribed. The medicine does not work as well when you skip doses. Skipping doses also puts you at risk for problems.   Do not smoke.   Monitor your blood pressure at home as directed by your health care provider. SEEK MEDICAL CARE IF:   You think you are having a reaction to medicines taken.  You have recurrent headaches or feel dizzy.  You have swelling in your ankles.  You have trouble with your vision. SEEK IMMEDIATE MEDICAL CARE IF:  You develop a severe headache or confusion.  You have unusual weakness, numbness, or feel faint.  You have severe chest or abdominal pain.  You vomit repeatedly.  You have trouble breathing. MAKE SURE YOU:   Understand these instructions.  Will watch your condition.  Will get help right away if you are not doing well or get worse. Document Released: 10/12/2005 Document Revised: 02/26/2014 Document Reviewed: 08/04/2013  [facility] Patient Information 2015 [place], [place]. This  information is not intended to replace advice given to you by your health care provider. Make sure you discuss any questions you have with your health care provider.  Type 2 Diabetes Mellitus Type 2 diabetes mellitus, often simply referred to as type 2 diabetes, is a long-lasting (chronic) disease. In type 2 diabetes, the pancreas does not make enough insulin (a hormone), the cells are less responsive to the insulin that is made (insulin resistance), or both. Normally, insulin moves sugars from food into the tissue cells. The tissue cells use the sugars for energy. The lack of insulin or the lack of normal response to insulin causes excess sugars to build up in the blood instead of going into the tissue cells. As a result, high blood sugar (hyperglycemia) develops. The effect of high sugar (glucose) levels can cause many complications. Type 2 diabetes was also previously called adult-onset diabetes, but it can occur at any age.  RISK FACTORS  A person is predisposed to developing type  2 diabetes if someone in the family has the disease and also has one or more of the following primary risk factors:  Overweight.  An inactive lifestyle.  A history of consistently eating high-calorie foods. Maintaining a normal weight and regular physical activity can reduce the chance of developing type 2 diabetes. SYMPTOMS  A person with type 2 diabetes may not show symptoms initially. The symptoms of type 2 diabetes appear slowly. The symptoms include:  Increased thirst (polydipsia).  Increased urination (polyuria).  Increased urination during the night (nocturia).  Weight loss. This weight loss may be rapid.  Frequent, recurring infections.  Tiredness (fatigue).  Weakness.  Vision changes, such as blurred vision.  Fruity smell to your breath.  Abdominal pain.  Nausea or vomiting.  Cuts or bruises which are slow to heal.  Tingling or numbness in the hands or feet. DIAGNOSIS Type 2  diabetes is frequently not diagnosed until complications of diabetes are present. Type 2 diabetes is diagnosed when symptoms or complications are present and when blood glucose levels are increased. Your blood glucose level may be checked by one or more of the following blood tests:  A fasting blood glucose test. You will not be allowed to eat for at least 8 hours before a blood sample is taken.  A random blood glucose test. Your blood glucose is checked at any time of the day regardless of when you ate.  A hemoglobin A1c blood glucose test. A hemoglobin A1c test provides information about blood glucose control over the previous 3 months.  An oral glucose tolerance test (OGTT). Your blood glucose is measured after you have not eaten (fasted) for 2 hours and then after you drink a glucose-containing beverage. TREATMENT   You may need to take insulin or diabetes medicine daily to keep blood glucose levels in the desired range.  If you use insulin, you may need to adjust the dosage depending on the carbohydrates that you eat with each meal or snack. The treatment goal is to maintain the before meal blood sugar (preprandial glucose) level at 70-130 mg/dL. HOME CARE INSTRUCTIONS   Have your hemoglobin A1c level checked twice a year.  Perform daily blood glucose monitoring as directed by your health care provider.  Monitor urine ketones when you are ill and as directed by your health care provider.  Take your diabetes medicine or insulin as directed by your health care provider to maintain your blood glucose levels in the desired range.  Never run out of diabetes medicine or insulin. It is needed every day.  If you are using insulin, you may need to adjust the amount of insulin given based on your intake of carbohydrates. Carbohydrates can raise blood glucose levels but need to be included in your diet. Carbohydrates provide vitamins, minerals, and fiber which are an essential part of a healthy  diet. Carbohydrates are found in fruits, vegetables, whole grains, dairy products, legumes, and foods containing added sugars.  Eat healthy foods. You should make an appointment to see a registered dietitian to help you create an eating plan that is right for you.  Lose weight if you are overweight.  Carry a medical alert card or wear your medical alert jewelry.  Carry a 15-gram carbohydrate snack with you at all times to treat low blood glucose (hypoglycemia). Some examples of 15-gram carbohydrate snacks include:  Glucose tablets, 3 or 4.  Glucose gel, 15-gram tube.  Raisins, 2 tablespoons (24 grams).  Jelly beans, 6.  Animal crackers, 8.  Regular pop, 4 ounces (120 mL).  Gummy treats, 9.  Recognize hypoglycemia. Hypoglycemia occurs with blood glucose levels of 70 mg/dL and below. The risk for hypoglycemia increases when fasting or skipping meals, during or after intense exercise, and during sleep. Hypoglycemia symptoms can include:  Tremors or shakes.  Decreased ability to concentrate.  Sweating.  Increased heart rate.  Headache.  Dry mouth.  Hunger.  Irritability.  Anxiety.  Restless sleep.  Altered speech or coordination.  Confusion.  Treat hypoglycemia promptly. If you are alert and able to safely swallow, follow the 15:15 rule:  Take 15-20 grams of rapid-acting glucose or carbohydrate. Rapid-acting options include glucose gel, glucose tablets, or 4 ounces (120 mL) of fruit juice, regular soda, or low-fat milk.  Check your blood glucose level 15 minutes after taking the glucose.  Take 15-20 grams more of glucose if the repeat blood glucose level is still 70 mg/dL or below.  Eat a meal or snack within 1 hour once blood glucose levels return to normal.  Be alert to feeling very thirsty and urinating more frequently than usual, which are early signs of hyperglycemia. An early awareness of hyperglycemia allows for prompt treatment. Treat hyperglycemia as  directed by your health care provider.  Engage in at least 150 minutes of moderate-intensity physical activity a week, spread over at least 3 days of the week or as directed by your health care provider. In addition, you should engage in resistance exercise at least 2 times a week or as directed by your health care provider. Try to spend no more than 90 minutes at one time inactive.  Adjust your medicine and food intake as needed if you start a new exercise or sport.  Follow your sick-day plan anytime you are unable to eat or drink as usual.  Do not use any tobacco products including cigarettes, chewing tobacco, or electronic cigarettes. If you need help quitting, ask your health care provider.  Limit alcohol intake to no more than 1 drink per day for nonpregnant women and 2 drinks per day for men. You should drink alcohol only when you are also eating food. Talk with your health care provider whether alcohol is safe for you. Tell your health care provider if you drink alcohol several times a week.  Keep all follow-up visits as directed by your health care provider. This is important.  Schedule an eye exam soon after the diagnosis of type 2 diabetes and then annually.  Perform daily skin and foot care. Examine your skin and feet daily for cuts, bruises, redness, nail problems, bleeding, blisters, or sores. A foot exam by a health care provider should be done annually.  Brush your teeth and gums at least twice a day and floss at least once a day. Follow up with your dentist regularly.  Share your diabetes management plan with your workplace or school.  Stay up-to-date with immunizations. It is recommended that people with diabetes who are over 78 years old get the pneumonia vaccine. In some cases, two separate shots may be given. Ask your health care provider if your pneumonia vaccination is up-to-date.  Learn to manage stress.  Obtain ongoing diabetes education and support as  needed.  Participate in or seek rehabilitation as needed to maintain or improve independence and quality of life. Request a physical or occupational therapy referral if you are having foot or hand numbness, or difficulties with grooming, dressing, eating, or physical activity. SEEK MEDICAL CARE IF:   You are unable to  eat food or drink fluids for more than 6 hours.  You have nausea and vomiting for more than 6 hours.  Your blood glucose level is over 240 mg/dL.  There is a change in mental status.  You develop an additional serious illness.  You have diarrhea for more than 6 hours.  You have been sick or have had a fever for a couple of days and are not getting better.  You have pain during any physical activity.  SEEK IMMEDIATE MEDICAL CARE IF:  You have difficulty breathing.  You have moderate to large ketone levels. MAKE SURE YOU:  Understand these instructions.  Will watch your condition.  Will get help right away if you are not doing well or get worse. Document Released: 10/12/2005 Document Revised: 02/26/2014 Document Reviewed: 05/10/2012 [facility] Patient Information 2015 [place], [place]. This information is not intended to replace advice given to you by your health care provider. Make sure you discuss any questions you have with your health care provider.   Repaglinide tablets What is this medicine? REPAGLINIDE (re PAG lin ide) helps to treat type 2 diabetes. It helps to control blood sugar. Treatment is combined with diet and exercise. This medicine may be used for other purposes; ask your health care provider or pharmacist if you have questions. COMMON BRAND NAME(S): Prandin What should I tell my health care provider before I take this medicine? They need to know if you have any of these conditions: -diabetic ketoacidosis -kidney disease -liver disease -severe infection or injury -an unusual or allergic reaction to repaglinide or other medicines, foods, dyes,  or preservatives -pregnant or trying to get pregnant -breast-feeding How should I use this medicine? Take this medicine by mouth with a glass of water. Follow the directions on the prescription label. The dose should be taken no earlier than 30 minutes before every meal. If an extra meal is added, take a tablet before that meal. If a meal is skipped, skip the dose for that meal. Do not take more often than directed. Talk to your pediatrician regarding the use of this medicine in children. Special care may be needed. Elderly patients over 1 years old may have a stronger reaction and need a smaller dose. Overdosage: If you think you have taken too much of this medicine contact a poison control center or emergency room at once. NOTE: This medicine is only for you. Do not share this medicine with others. What if I miss a dose? If you miss a dose before a meal, skip that dose. If it is almost time for your next dose, take only that dose with the next scheduled meal as directed. Do not take double or extra doses. What may interact with this medicine? -barbiturates like phenobarbital or primidone -carbamazepine -clarithromycin -erythromycin -gemfibrozil -isophane insulin, NPH -medicines for fungal or yeast infections such as itraconazole, ketoconazole, miconazole -montelukast -other medicines for diabetes -rifampin -simvastatin Many medications may cause an increase or decrease in blood sugar, these include: -alcohol containing beverages -aspirin and aspirin-like drugs -chloramphenicol -chromium -diuretics -female hormones, such as estrogens or progestins, birth control pills -heart medicines -isoniazid -female hormones or anabolic steroids -medications for weight loss -medicines for allergies, asthma, cold, or cough -medicines for mental problems -medicines called MAO inhibitors - Nardil, Parnate, Marplan, Eldepryl -niacin -NSAIDS, such as  ibuprofen -pentamidine -phenytoin -probenecid -quinolone antibiotics such as ciprofloxacin, levofloxacin, ofloxacin -some herbal dietary supplements -steroid medicines such as prednisone or cortisone -thyroid hormones This list may not describe all possible interactions.  Give your health care provider a list of all the medicines, herbs, non-prescription drugs, or dietary supplements you use. Also tell them if you smoke, drink alcohol, or use illegal drugs. Some items may interact with your medicine. What should I watch for while using this medicine? Visit your doctor or health care professional for regular checks on your progress. A test called the HbA1C (A1C) will be monitored. This is a simple blood test. It measures your blood sugar control over the last 2 to 3 months. You will receive this test every 3 to 6 months. Learn how to check your blood sugar. Learn the symptoms of low and high blood sugar and how to manage them. Always carry a quick-source of sugar with you in case you have symptoms of low blood sugar. Examples include hard sugar candy or glucose tablets. Make sure others know that you can choke if you eat or drink when you develop serious symptoms of low blood sugar, such as seizures or unconsciousness. They must get medical help at once. Tell your doctor or health care professional if you have high blood sugar. You might need to change the dose of your medicine. If you are sick or exercising more than usual, you might need to change the dose of your medicine. Do not skip meals. Ask your doctor or health care professional if you should avoid alcohol. Many nonprescription cough and cold products contain sugar or alcohol. These can affect blood sugar. Wear a medical ID bracelet or chain, and carry a card that describes your disease and details of your medicine and dosage times. What side effects may I notice from receiving this medicine? Side effects that you should report to your  doctor or health care professional as soon as possible: -allergic reactions like skin rash, itching or hives, swelling of the face, lips, or tongue -breathing difficulties -dark urine -fever, chills -signs and symptoms of low blood sugar such as feeling anxious, confusion, dizziness, increased hunger, unusually weak or tired, sweating, shakiness, cold, irritable, headache, blurred vision, fast heartbeat, loss of consciousness -vomiting -yellowing of the eyes or skin Side effects that usually do not require medical attention (report to your doctor or health care professional if they continue or are bothersome): -back pain -diarrhea -headache -joint pain -nausea This list may not describe all possible side effects. Call your doctor for medical advice about side effects. You may report side effects to FDA at 1-800-FDA-1088. Where should I keep my medicine? Keep out of the reach of children. Store at room temperature between 15 and 30 degrees C (59 and 86 degrees F). Keep container tightly closed. Throw away any unused medicine after the expiration date. NOTE: This sheet is a summary. It may not cover all possible information. If you have questions about this medicine, talk to your doctor, pharmacist, or health care provider.  2015, [employer]. (2013-01-25 13:57:23)

## 2014-12-07 ENCOUNTER — Encounter: Payer: Self-pay | Admitting: Internal Medicine

## 2014-12-07 DIAGNOSIS — E559 Vitamin D deficiency, unspecified: Secondary | ICD-10-CM | POA: Insufficient documentation

## 2014-12-07 NOTE — Assessment & Plan Note (Signed)
pt interested in pap clinic, mammo sch 2/15 at 8:15 am, will check lipid panel and HA1C today.

## 2014-12-07 NOTE — Progress Notes (Signed)
   Subjective:    Patient ID: Kristy [name], female    DOB: February 09, 1956, 59 y.o.   MRN: [id number]  HPI Comments: 59 y.o PMH uncontrolled DM 2 with neuropathy, HTN, renal artery stenosis s/p ballon angioplasty, microalbuminuira   She presents for f/u   1. HTN-initial BP 165/101 then repeat 130/80 taking Norvasc 10, lotensin 40 mg, Lasix 120 mg qd, Coreg 25 mg bid, Hytrin 2 mg qhs  2. DM 2 uncontrolled with neuropathy-HA1C today 9.7>>9.1 with cbg 127.  She is taking Metformin 500 mg bid (cant tolerate higher dose) and Lantus 40 units daily.  Meter readings range from 70s to 260s but mostly high.  She reports stopping gabapentin 100 tid in the past b/c it did not help and made her sleepy.  Of note eye exam 05/2014 with Dr. Nicki Reaper with mild cataracts, mild nonproliferative retinopathy, HTN retinopathy. Will need to revisit eye MD 05/2015.    3. Depression-tearful on exam when discussing her husband and home situation.  She lives with a lot of relatives and having to cook and clean for them and they are not as neat as she and also her husband also smokes around her when she has mentioned to him she does not like cigarette smoke. She states she is depressed has lost interest in going places at times, she has trouble concentrating and sleeping.  She denies crying spells and appetite is wnl. She denies SI/HI.   4. She reports muscle ache from shoulders to hands even after stopping pravastatin.    5. H/o vit d def-will check vit D level again today to decide dose.    HM-pt interested in pap clinic, mammo sch 2/15 at 8:15 am, will check lipid panel and HA1C today.          Review of Systems  Respiratory: Negative for shortness of breath.   Cardiovascular: Negative for chest pain.  Gastrointestinal: Negative for abdominal pain.  Musculoskeletal: Negative for arthralgias.       Objective:   Physical Exam  Constitutional: She is oriented to person, place, and time. Vital signs are normal. She  appears well-developed and well-nourished. She is cooperative. No distress.  HENT:  Head: Normocephalic and atraumatic.  Mouth/Throat: No oropharyngeal exudate.  Eyes: Pupils are equal, round, and reactive to light. Right eye exhibits no discharge. Left eye exhibits no discharge. Right conjunctiva is injected. Left conjunctiva is injected. No scleral icterus.  Cardiovascular: Normal rate, regular rhythm, S1 normal, S2 normal and normal heart sounds.   No murmur heard. 1+ edema b/l lower ext   Pulmonary/Chest: Effort normal and breath sounds normal. No respiratory distress. She has no wheezes.  Abdominal: Soft. Bowel sounds are normal. There is no tenderness.  Neurological: She is alert and oriented to person, place, and time. Gait normal.  Skin: Skin is warm, dry and intact. No rash noted. She is not diaphoretic.  Psychiatric: Her speech is normal and behavior is normal. Judgment and thought content normal. Cognition and memory are normal. She exhibits a depressed mood.  Tearful on exam   Nursing note and vitals reviewed.         Assessment & Plan:  F/u as sch

## 2014-12-07 NOTE — Assessment & Plan Note (Signed)
Still present after stopping statin likely not related may be related to depression

## 2014-12-07 NOTE — Assessment & Plan Note (Signed)
Lab Results  Component Value Date   HGBA1C 9.1 12/06/2014   HGBA1C 9.7 08/09/2014   HGBA1C 7.8 05/10/2014     Assessment: Diabetes control: poor control (HgbA1C >9%) Progress toward A1C goal:  unable to assess Comments: HA1C stable  Plan: Medications:  continue current medications (will inc Lantus 40 to 44 units). disc'ed mealtime insulin pt prefers not to give herself 4 inj will try Prandin 1 mg tid with meals Home glucose monitoring: Frequency: 2 times a day Timing: before meals Self management tools provided: copy of home glucose meter download  Other plans: f/u in 1 month, bring meter

## 2014-12-07 NOTE — Assessment & Plan Note (Signed)
Will check lipid panel today Asked pt to resume statin pravachol

## 2014-12-07 NOTE — Assessment & Plan Note (Signed)
Likely due to stressors at home. Denies SI/HI Will try to start Cymbalta 40 mg qd to see if helps. Monitor for DDI with other meds i.e Serotonin syndrome) Given info about [employer]

## 2014-12-07 NOTE — Assessment & Plan Note (Signed)
Will check vitamin D today and decide which dose of vit D should be taking

## 2014-12-07 NOTE — Assessment & Plan Note (Signed)
No pain today if continues to bother will refer to ortho in future and consider imaging

## 2014-12-07 NOTE — Assessment & Plan Note (Signed)
BP Readings from Last 3 Encounters:  12/06/14 130/80  08/09/14 155/104  05/10/14 154/98    Lab Results  Component Value Date   NA 137 08/09/2014   K 4.0 08/09/2014   CREATININE 1.02 08/09/2014    Assessment: Blood pressure control: controlled Progress toward BP goal:  at goal Comments: none  Plan: Medications:  continue current medications (Norvasc 10, Lotensin 40 mg, Coreg 25 mg bid, Lasix 120 mg qd, hytrin 2 mg qhs) Educational resources provided: handout Self management tools provided:   Other plans: check BMET, lipid panel today. F/u as sch

## 2014-12-10 ENCOUNTER — Ambulatory Visit (HOSPITAL_COMMUNITY): Payer: Medicare Other

## 2014-12-11 NOTE — Progress Notes (Signed)
Internal Medicine Clinic Attending  Case discussed with Dr. McLean at the time of the visit.  We reviewed the resident's history and exam and pertinent patient test results.  I agree with the assessment, diagnosis, and plan of care documented in the resident's note. 

## 2014-12-13 ENCOUNTER — Ambulatory Visit (HOSPITAL_COMMUNITY)
Admission: RE | Admit: 2014-12-13 | Discharge: 2014-12-13 | Disposition: A | Discharge status: Home / Self Care | Payer: Medicare Other | Source: Ambulatory Visit | Attending: Internal Medicine | Admitting: Internal Medicine

## 2014-12-13 DIAGNOSIS — Z1231 Encounter for screening mammogram for malignant neoplasm of breast: Secondary | ICD-10-CM | POA: Diagnosis not present

## 2014-12-14 ENCOUNTER — Other Ambulatory Visit: Payer: Self-pay | Admitting: Internal Medicine

## 2014-12-14 DIAGNOSIS — E1149 Type 2 diabetes mellitus with other diabetic neurological complication: Secondary | ICD-10-CM

## 2014-12-14 DIAGNOSIS — R928 Other abnormal and inconclusive findings on diagnostic imaging of breast: Secondary | ICD-10-CM

## 2014-12-14 MED ORDER — INSULIN GLARGINE 100 UNIT/ML ~~LOC~~ SOLN: SUBCUTANEOUS | Status: DC

## 2014-12-17 ENCOUNTER — Other Ambulatory Visit (INDEPENDENT_AMBULATORY_CARE_PROVIDER_SITE_OTHER): Payer: Medicare Other

## 2014-12-17 DIAGNOSIS — I10 Essential (primary) hypertension: Secondary | ICD-10-CM | POA: Diagnosis not present

## 2014-12-17 DIAGNOSIS — E559 Vitamin D deficiency, unspecified: Secondary | ICD-10-CM | POA: Diagnosis not present

## 2014-12-17 DIAGNOSIS — E785 Hyperlipidemia, unspecified: Secondary | ICD-10-CM | POA: Diagnosis not present

## 2014-12-17 LAB — BASIC METABOLIC PANEL WITH GFR
BUN: 22 mg/dL (ref 6–23)
CALCIUM: 10.1 mg/dL (ref 8.4–10.5)
CHLORIDE: 104 meq/L (ref 96–112)
CO2: 26 meq/L (ref 19–32)
CREATININE: 1.31 mg/dL — AB (ref 0.50–1.10)
GFR, Est African American: 52 mL/min — ABNORMAL LOW
GFR, Est Non African American: 45 mL/min — ABNORMAL LOW
Glucose, Bld: 99 mg/dL (ref 70–99)
Potassium: 4.3 mEq/L (ref 3.5–5.3)
SODIUM: 142 meq/L (ref 135–145)

## 2014-12-17 LAB — LIPID PANEL
CHOLESTEROL: 202 mg/dL — AB (ref 0–200)
HDL: 47 mg/dL (ref >=46)
LDL CALC: 122 mg/dL — AB (ref 0–99)
TRIGLYCERIDES: 164 mg/dL — AB (ref <150)
Total CHOL/HDL Ratio: 4.3 Ratio
VLDL: 33 mg/dL (ref 0–40)

## 2014-12-18 ENCOUNTER — Other Ambulatory Visit: Payer: Self-pay | Admitting: Internal Medicine

## 2014-12-18 LAB — VITAMIN D 25 HYDROXY (VIT D DEFICIENCY, FRACTURES): VIT D 25 HYDROXY: 13 ng/mL — AB (ref 30–100)

## 2014-12-18 MED ORDER — VITAMIN D (ERGOCALCIFEROL) 1.25 MG (50000 UNIT) PO CAPS: 50000.0000 [IU] | ORAL_CAPSULE | ORAL | Status: DC

## 2014-12-24 ENCOUNTER — Ambulatory Visit
Admission: RE | Admit: 2014-12-24 | Discharge: 2014-12-24 | Disposition: A | Discharge status: Home / Self Care | Payer: Medicare Other | Source: Ambulatory Visit | Attending: Internal Medicine | Admitting: Internal Medicine

## 2014-12-24 ENCOUNTER — Other Ambulatory Visit: Payer: Self-pay | Admitting: Internal Medicine

## 2014-12-24 DIAGNOSIS — R928 Other abnormal and inconclusive findings on diagnostic imaging of breast: Secondary | ICD-10-CM

## 2014-12-24 DIAGNOSIS — R922 Inconclusive mammogram: Secondary | ICD-10-CM | POA: Diagnosis not present

## 2014-12-27 ENCOUNTER — Other Ambulatory Visit: Payer: Self-pay | Admitting: Pharmacist

## 2014-12-27 DIAGNOSIS — E1110 Type 2 diabetes mellitus with ketoacidosis without coma: Secondary | ICD-10-CM

## 2014-12-27 MED ORDER — "INSULIN SYRINGE 31G X 5/16"" 0.5 ML MISC": 1.0000 | Freq: Every day | Status: DC

## 2015-01-02 ENCOUNTER — Other Ambulatory Visit: Payer: Self-pay | Admitting: Internal Medicine

## 2015-01-02 ENCOUNTER — Ambulatory Visit
Admission: RE | Admit: 2015-01-02 | Discharge: 2015-01-02 | Disposition: A | Discharge status: Home / Self Care | Payer: Medicare Other | Source: Ambulatory Visit | Attending: Internal Medicine | Admitting: Internal Medicine

## 2015-01-02 DIAGNOSIS — N6310 Unspecified lump in the right breast, unspecified quadrant: Secondary | ICD-10-CM

## 2015-01-02 DIAGNOSIS — N6011 Diffuse cystic mastopathy of right breast: Secondary | ICD-10-CM | POA: Diagnosis not present

## 2015-01-02 DIAGNOSIS — R928 Other abnormal and inconclusive findings on diagnostic imaging of breast: Secondary | ICD-10-CM

## 2015-01-02 DIAGNOSIS — N63 Unspecified lump in breast: Secondary | ICD-10-CM | POA: Diagnosis not present

## 2015-01-03 ENCOUNTER — Telehealth: Payer: Self-pay | Admitting: Internal Medicine

## 2015-01-03 ENCOUNTER — Encounter: Payer: Self-pay | Admitting: *Deleted

## 2015-01-03 NOTE — Telephone Encounter (Signed)
Spoke with patient about right breast biopsy fibrocystic changes benign. She was aware and very happy with results   Aundra Dubin MD

## 2015-01-10 ENCOUNTER — Ambulatory Visit (INDEPENDENT_AMBULATORY_CARE_PROVIDER_SITE_OTHER): Payer: Medicare Other | Admitting: Internal Medicine

## 2015-01-10 ENCOUNTER — Encounter: Payer: Self-pay | Admitting: Internal Medicine

## 2015-01-10 VITALS — BP 149/97 | HR 81 | Temp 97.7°F | Ht 63.0 in | Wt 219.4 lb

## 2015-01-10 DIAGNOSIS — I10 Essential (primary) hypertension: Secondary | ICD-10-CM

## 2015-01-10 DIAGNOSIS — F329 Major depressive disorder, single episode, unspecified: Secondary | ICD-10-CM

## 2015-01-10 DIAGNOSIS — Z0000 Encounter for general adult medical examination without abnormal findings: Secondary | ICD-10-CM

## 2015-01-10 DIAGNOSIS — E1142 Type 2 diabetes mellitus with diabetic polyneuropathy: Secondary | ICD-10-CM

## 2015-01-10 DIAGNOSIS — F32A Depression, unspecified: Secondary | ICD-10-CM

## 2015-01-10 LAB — GLUCOSE, CAPILLARY: GLUCOSE-CAPILLARY: 134 mg/dL — AB (ref 70–99)

## 2015-01-10 NOTE — Assessment & Plan Note (Signed)
Will need pap smear in near future hopefully can get at pap smear clinic

## 2015-01-10 NOTE — Assessment & Plan Note (Addendum)
Improved on Cymbalta 40 mg  Will keep dose current Reassess for need in future ~9-12 months of treatment or if declines may need higher dose cont same dose for now

## 2015-01-10 NOTE — Assessment & Plan Note (Signed)
Resumed Pravachol

## 2015-01-10 NOTE — Progress Notes (Signed)
   Subjective:    Patient ID: Kristy [name], female    DOB: 10-15-1956, 59 y.o.   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 Uncontrolled DM last A1C 9.1 12/06/14 at that time increased Lantus to 44 units and Prandin 1 mg tid. She is compliant brings in meter with readings 120s-150s so improved 2. H/o HTN-BP today 149/97 on Norvasc 10, Lotensin 40, Coreg 25 mg bid, Lasix 120 qd, Cardura 2 mg. Last Cr. Elevated 1.3 will repeat BMET today to see if needs to be on high dose of Lasix or should decrease 3. Depression-placed on Cymbalta since last visit and helping. She is having decreased crying spells and mood improved overall 4. Recent right breast biopsy negative for malignancy + fibrocystic changes.  5. H/o renal artery stenosis s/p stent-Pt states she is no longer following with Dr. Vanetta Mulders renal b/c she felt like they were just calling our clinic to check on her labs and she has not seen [facility] in a while and will call and make f/u appt  SH-likes walking her 79 or 20 year old dog-Jack Lenny Pastel "Barnabas Lister", likes cooking and doing house work HM-due for pap smear       Review of Systems  Respiratory: Negative for shortness of breath.   Cardiovascular: Negative for chest pain.  Gastrointestinal: Negative for constipation.       Objective:   Physical Exam  Constitutional: She is oriented to person, place, and time. She appears well-developed and well-nourished. She is cooperative. No distress.  HENT:  Head: Normocephalic and atraumatic.  Eyes: Conjunctivae are normal. Right eye exhibits no discharge. Left eye exhibits no discharge. No scleral icterus.  Cardiovascular: Normal rate, regular rhythm, S1 normal, S2 normal and normal heart sounds.   No murmur heard. 2+ leg edema   Pulmonary/Chest: Effort normal and breath  sounds normal. No respiratory distress. She has no wheezes.  Neurological: She is alert and oriented to person, place, and time. Gait normal.  Skin: Skin is warm and dry. No rash noted. She is not diaphoretic.  Psychiatric: She has a normal mood and affect. Her speech is normal and behavior is normal. Judgment and thought content normal. Cognition and memory are normal.  Mood improved   Nursing note and vitals reviewed.         Assessment & Plan:  F/u 5 or 03/2015 DM, HTN f/u and HA1C

## 2015-01-10 NOTE — Patient Instructions (Signed)
General Instructions: Please follow up in middle of May 2016  Take care    Treatment Goals:  Goals (1 Years of Data) as of 01/10/15          As of Today 12/06/14 12/06/14 08/09/14 08/09/14     Blood Pressure   . Blood Pressure < 140/90  149/97 130/80 165/101 155/104 170/96     Result Component   . HEMOGLOBIN A1C < 7.0   9.1  9.7       Progress Toward Treatment Goals:  Treatment Goal 01/10/2015  Hemoglobin A1C unchanged  Blood pressure unchanged  Prevent falls -    Self Care Goals & Plans:  Self Care Goal 01/10/2015  Manage my medications take my medicines as prescribed; bring my medications to every visit; refill my medications on time; follow the sick day instructions if I am sick  Monitor my health keep track of my blood pressure  Eat healthy foods drink diet soda or water instead of juice or soda  Be physically active find an activity I enjoy  Prevent falls -  Meeting treatment goals maintain the current self-care plan    Home Blood Glucose Monitoring 01/10/2015  Check my blood sugar 2 times a day  When to check my blood sugar before meals     Care Management & Community Referrals:  Referral 01/10/2015  Referrals made for care management support none needed  Referrals made to community resources none    Exercise to Lose Weight Exercise and a healthy diet may help you lose weight. Your doctor may suggest specific exercises. EXERCISE IDEAS AND TIPS  Choose low-cost things you enjoy doing, such as walking, bicycling, or exercising to workout videos.  Take stairs instead of the elevator.  Walk during your lunch break.  Park your car further away from work or school.  Go to a gym or an exercise class.  Start with 5 to 10 minutes of exercise each day. Build up to 30 minutes of exercise 4 to 6 days a week.  Wear shoes with good support and comfortable clothes.  Stretch before and after working out.  Work out until you breathe harder and your heart beats  faster.  Drink extra water when you exercise.  Do not do so much that you hurt yourself, feel dizzy, or get very short of breath. Exercises that burn about 150 calories:  Running 1  miles in 15 minutes.  Playing volleyball for 45 to 60 minutes.  Washing and waxing a car for 45 to 60 minutes.  Playing touch football for 45 minutes.  Walking 1  miles in 35 minutes.  Pushing a stroller 1  miles in 30 minutes.  Playing basketball for 30 minutes.  Raking leaves for 30 minutes.  Bicycling 5 miles in 30 minutes.  Walking 2 miles in 30 minutes.  Dancing for 30 minutes.  Shoveling snow for 15 minutes.  Swimming laps for 20 minutes.  Walking up stairs for 15 minutes.  Bicycling 4 miles in 15 minutes.  Gardening for 30 to 45 minutes.  Jumping rope for 15 minutes.  Washing windows or floors for 45 to 60 minutes. Document Released: 11/14/2010 Document Revised: 01/04/2012 Document Reviewed: 11/14/2010 [facility] Patient Information 2015 [place], [place]. This information is not intended to replace advice given to you by your health care provider. Make sure you discuss any questions you have with your health care provider.

## 2015-01-10 NOTE — Assessment & Plan Note (Signed)
BP Readings from Last 3 Encounters:  01/10/15 149/97  12/06/14 130/80  08/09/14 155/104    Lab Results  Component Value Date   NA 142 12/17/2014   K 4.3 12/17/2014   CREATININE 1.31* 12/17/2014    Assessment: Blood pressure control: mildly elevated Progress toward BP goal:  unchanged Comments: none  Plan: Medications:  continue current medications Norvasc 10, Lotension 40, Lasix 120 mg qd, Cardura 2, Coreg 25 mg bid  Other plans: check BMET today if elevating then reduce dose of Lasix likely to 80 mg, f/u in 02/2015

## 2015-01-10 NOTE — Assessment & Plan Note (Signed)
Pt will make f/u appt with cards to check on stent

## 2015-01-10 NOTE — Assessment & Plan Note (Signed)
Lab Results  Component Value Date   HGBA1C 9.1 12/06/2014   HGBA1C 9.7 08/09/2014   HGBA1C 7.8 05/10/2014     Assessment: Diabetes control: poor control (HgbA1C >9%) Progress toward A1C goal:  unchanged Comments: cbgs improved on Prandin w/o evidence of hypoglycemia on meter readings   Plan: Medications:  continue current medications Lantus 44 units, Prandin 1 mg tid  Home glucose monitoring: Frequency: 2 times a day Timing: before meals Instruction/counseling given: no instruction/counseling  Other plans: disc exercise. F/u 02/2015 repeat A1C at that time

## 2015-01-11 LAB — BASIC METABOLIC PANEL WITH GFR
BUN: 20 mg/dL (ref 6–23)
CO2: 28 meq/L (ref 19–32)
Calcium: 10 mg/dL (ref 8.4–10.5)
Chloride: 103 mEq/L (ref 96–112)
Creat: 1.11 mg/dL — ABNORMAL HIGH (ref 0.50–1.10)
GFR, Est African American: 63 mL/min
GFR, Est Non African American: 55 mL/min — ABNORMAL LOW
Glucose, Bld: 117 mg/dL — ABNORMAL HIGH (ref 70–99)
POTASSIUM: 3.9 meq/L (ref 3.5–5.3)
SODIUM: 139 meq/L (ref 135–145)

## 2015-01-14 ENCOUNTER — Other Ambulatory Visit: Payer: Self-pay | Admitting: Internal Medicine

## 2015-01-14 DIAGNOSIS — I10 Essential (primary) hypertension: Secondary | ICD-10-CM

## 2015-01-15 NOTE — Progress Notes (Signed)
Case discussed with Dr. McLean at time of visit.  We reviewed the resident's history and exam and pertinent patient test results.  I agree with the assessment, diagnosis, and plan of care documented in the resident's note.  

## 2015-02-05 ENCOUNTER — Encounter: Payer: Self-pay | Admitting: Gastroenterology

## 2015-02-25 ENCOUNTER — Other Ambulatory Visit: Payer: Self-pay | Admitting: Internal Medicine

## 2015-03-13 ENCOUNTER — Encounter: Payer: Self-pay | Admitting: *Deleted

## 2015-03-28 ENCOUNTER — Encounter: Payer: Self-pay | Admitting: Internal Medicine

## 2015-04-01 ENCOUNTER — Other Ambulatory Visit: Payer: Self-pay | Admitting: Pharmacist

## 2015-04-01 ENCOUNTER — Other Ambulatory Visit: Payer: Self-pay | Admitting: *Deleted

## 2015-04-01 DIAGNOSIS — M25572 Pain in left ankle and joints of left foot: Secondary | ICD-10-CM

## 2015-04-01 NOTE — Telephone Encounter (Signed)
Pt last seen in March. DM uncontrolled. Cancelled her June appt with her PCP. Pls tell her to sch an appt for June and then I will refill her med.

## 2015-04-02 MED ORDER — TRAMADOL HCL 50 MG PO TABS: 50.0000 mg | ORAL_TABLET | Freq: Four times a day (QID) | ORAL | Status: DC | PRN

## 2015-04-02 NOTE — Telephone Encounter (Signed)
[facility] - getting 120 every 30 days. Was increased from 45 to 120 without updating contract. No UDS. Cancelled PCP appt now has appt Dr Ronnald Ramp 7[facility] to deal with uncontrolled DM and recheck HTN. Doubt pain can also be addressed at that appt and likely will need F/U for DM anyway.  1. Fill tramadol for total 2 months 2. Ask appt be sch new PCP [place] within next 60 days to FU DM, HTN, and pain and get new contract 3. UDS when pt comes tomorrow for [facility] appt. Results to be addressed PCP appt next 60 days

## 2015-04-02 NOTE — Telephone Encounter (Signed)
Pt scheduled for OV tomorrow.  

## 2015-04-02 NOTE — Telephone Encounter (Signed)
Rx called in 

## 2015-04-03 ENCOUNTER — Ambulatory Visit (INDEPENDENT_AMBULATORY_CARE_PROVIDER_SITE_OTHER): Payer: Medicare Other | Admitting: Internal Medicine

## 2015-04-03 ENCOUNTER — Encounter: Payer: Self-pay | Admitting: Internal Medicine

## 2015-04-03 VITALS — BP 155/95 | HR 87 | Temp 98.4°F | Ht 63.0 in | Wt 219.7 lb

## 2015-04-03 DIAGNOSIS — F329 Major depressive disorder, single episode, unspecified: Secondary | ICD-10-CM

## 2015-04-03 DIAGNOSIS — M25561 Pain in right knee: Secondary | ICD-10-CM

## 2015-04-03 DIAGNOSIS — E1142 Type 2 diabetes mellitus with diabetic polyneuropathy: Secondary | ICD-10-CM | POA: Diagnosis not present

## 2015-04-03 DIAGNOSIS — I10 Essential (primary) hypertension: Secondary | ICD-10-CM | POA: Diagnosis not present

## 2015-04-03 DIAGNOSIS — M25569 Pain in unspecified knee: Secondary | ICD-10-CM

## 2015-04-03 DIAGNOSIS — Z794 Long term (current) use of insulin: Secondary | ICD-10-CM

## 2015-04-03 DIAGNOSIS — G8929 Other chronic pain: Secondary | ICD-10-CM | POA: Diagnosis not present

## 2015-04-03 DIAGNOSIS — F32A Depression, unspecified: Secondary | ICD-10-CM

## 2015-04-03 LAB — POCT GLYCOSYLATED HEMOGLOBIN (HGB A1C): Hemoglobin A1C: 7.7

## 2015-04-03 LAB — GLUCOSE, CAPILLARY: GLUCOSE-CAPILLARY: 141 mg/dL — AB (ref 65–99)

## 2015-04-03 MED ORDER — DULOXETINE HCL 20 MG PO CPEP: 40.0000 mg | ORAL_CAPSULE | Freq: Every day | ORAL | Status: DC

## 2015-04-03 NOTE — Progress Notes (Signed)
Subjective:   Patient ID: ATZI KETCHEN female   DOB: 24-Aug-1956 59 y.o.   MRN: [id number]  HPI: [name] is a 59 y.o. female w/ PMHx of HTN, HLD, DM type II, RAS, OSA, and depression, presents to the clinic today for a follow-up visit regarding her DM and HTN. Patient was seen in the clinic by Dr. Aundra Dubin on 12/07/14 at which time her Lantus was increased to 44 units daily and she was started on Prandin 1 mg tid w/ meals (in addition to her Metformin 500 mg bid). Since that time it seems that her CBG's have been significantly improved. According to her meter, her average CBG is 143 w/ a range of 124-157. She denies any significant symptoms of hypoglycemia and says she has not noted any significantly low blood sugars.   Her BP is slightly elevated today, however, she states she has not taken her BP meds yet this AM.   The patient has also had recent knee pain that has been bothering her for which she is taking Tramadol and requiring more than she was previously contracted for. She states the last time she took Tramadol was last night (04/02/15). She was given a refill for 2 months worth of Tramadol, at which time she is to return and discuss her pain management w/ per PCP.    Current Outpatient Prescriptions  Medication Sig Dispense Refill  . amLODipine (NORVASC) 10 MG tablet Take 1 tablet (10 mg total) by mouth daily. 90 tablet 1  . aspirin 81 MG EC tablet Take 81 mg by mouth daily.      . benazepril (LOTENSIN) 40 MG tablet Take 1 tablet (40 mg total) by mouth daily. 90 tablet 1  . carvedilol (COREG) 25 MG tablet TAKE ONE TABLET BY MOUTH TWICE DAILY 180 tablet 0  . DULoxetine (CYMBALTA) 20 MG capsule Take 2 capsules (40 mg total) by mouth daily. 60 capsule 0  . furosemide (LASIX) 80 MG tablet Take 1.5 tablets daily 135 tablet 0  . glucose blood (RELION PRIME TEST) test strip To use in glucose monitor to obtain blood sugar 3 to 4 times daily. diag code 250.62. Insulin dependent 100 each 5    . glucose blood test strip 250.0 Use as instructed 100 each 12  . insulin glargine (LANTUS) 100 UNIT/ML injection INJECT 44 UNITS UNDER THE SKIN AT BEDTIME 10 vial 5  . Insulin Pen Needle (B-D UF III MINI PEN NEEDLES) 31G X 5 MM MISC 1 Device by Does not apply route 3 (three) times daily before meals. Check daily 3X before meals 100 each 12  . Insulin Syringe-Needle U-100 (INSULIN SYRINGE .5CC/31GX5/16") 31G X 5/16" 0.5 ML MISC Inject 1 Dose into the skin daily. The patient is insulin requiring, ICD 10 code E11.42. The patient tests 2 times per day. 100 each 12  . metFORMIN (GLUCOPHAGE) 500 MG tablet TAKE ONE TABLET BY MOUTH TWICE DAILY 60 tablet 5  . pravastatin (PRAVACHOL) 80 MG tablet Daily at night 90 tablet 3  . repaglinide (PRANDIN) 1 MG tablet Take 1 tablet (1 mg total) by mouth 3 (three) times daily before meals. 90 tablet 1  . terazosin (HYTRIN) 2 MG capsule Take 1 capsule (2 mg total) by mouth at bedtime. 90 capsule 3  . traMADol (ULTRAM) 50 MG tablet Take 1 tablet (50 mg total) by mouth every 6 (six) hours as needed. 120 tablet 1  . Vitamin D, Ergocalciferol, (DRISDOL) 50000 UNITS CAPS capsule Take 1 capsule (  50,000 Units total) by mouth every 7 (seven) days. 8 capsule 0   No current facility-administered medications for this visit.   Review of Systems  General: Denies fever, diaphoresis, appetite change, and fatigue.  Respiratory: Denies SOB, cough, and wheezing.   Cardiovascular: Denies chest pain and palpitations.  Gastrointestinal: Denies nausea, vomiting, abdominal pain, and diarrhea Musculoskeletal: Positive for knee pain. Denies myalgias, back pain, and gait problem.  Neurological: Denies dizziness, syncope, weakness, lightheadedness, and headaches.  Psychiatric/Behavioral: Denies mood changes, sleep disturbance, and agitation.   Objective:   Physical Exam: Filed Vitals:   04/03/15 0915  BP: 155/95  Pulse: 87  Temp: 98.4 F (36.9 C)  TempSrc: Oral  Height: 5\' 3"   (1.6 m)  Weight: 219 lb 11.2 oz (99.655 kg)  SpO2: 100%    General: Overweight AA female, alert, cooperative, NAD. HEENT: PERRL, EOMI. Moist mucus membranes Neck: Full range of motion without pain, supple, no lymphadenopathy or carotid bruits Lungs: Clear to ascultation bilaterally, normal work of respiration, no wheezes, rales, rhonchi Heart: RRR, no murmurs, gallops, or rubs Abdomen: Soft, non-tender, non-distended, BS + Extremities: No cyanosis, clubbing, or edema Neurologic: Alert & oriented x3, cranial nerves II-XII intact, strength grossly intact, sensation intact to light touch   Assessment & Plan:   Please see problem based assessment and plan.

## 2015-04-03 NOTE — Assessment & Plan Note (Signed)
Still having depression symptoms but states that she has not taken her Cymbalta in about 1 month. Says she doesn't have any refills.  -Refill Cymbalta. Patient understands she is to take this every day. Also explained that this might also benefit her w/ respect to her chronic pain.

## 2015-04-03 NOTE — Assessment & Plan Note (Addendum)
Takes Tramadol for this. Pain contract from 2014 says she is to receive #90 Tramadol per month, however, she seems to have been receiving #120 per month. Called clinic yesterday, given Tramadol #120 for 2 months, at which time she is to return and discuss her pain management with her new PCP.  -Checked UDS today; should be positive for tramadol as she states she took it last night.

## 2015-04-03 NOTE — Progress Notes (Signed)
Internal Medicine Clinic Attending  Case discussed with Dr. Jones at the time of the visit.  We reviewed the resident's history and exam and pertinent patient test results.  I agree with the assessment, diagnosis, and plan of care documented in the resident's note.  

## 2015-04-03 NOTE — Assessment & Plan Note (Signed)
BP Readings from Last 3 Encounters:  04/03/15 155/95  01/10/15 149/97  12/06/14 130/80    Lab Results  Component Value Date   NA 139 01/10/2015   K 3.9 01/10/2015   CREATININE 1.11* 01/10/2015    Assessment: Comments: Patient is taking Coreg 25 mg bid, Lasix 120 mg daily, Lotensin 40 mg daily, Norvasc 10 mg daily, and Terazosin 2 mg qhs. The patient states that she has not taken her BP meds this AM, which would explain her elevated BP. Explained to her that she is to take her BP medications PRIOR to her next visit so changes can be made to her regimen if her BP is still elevated after she takes her medications. She stated she would do this next time. If continues to be elevated, she will either need an additional agent given that her dosages are all at their maximum, or one medication should be stopped and a stronger one added.   Plan: Medications:  continue current medications Educational resources provided: brochure (denial) Self management tools provided:   Other plans: RTC in 2 months.

## 2015-04-03 NOTE — Patient Instructions (Signed)
1. Please schedule an appointment in 2 months with your PCP to discuss your pain management further.   2. Please take all medications as previously prescribed with the following changes:  CONTINUE CYMBALTA. Make sure you take this daily. Refill has been sent to your pharmacy.   GOOD JOB MANAGING YOUR BLOOD SUGARS! KEEP UP THE GOOD WORK!  3. If you have worsening of your symptoms or new symptoms arise, please call the clinic [phone]), or go to the ER immediately if symptoms are severe.

## 2015-04-03 NOTE — Assessment & Plan Note (Signed)
Lab Results  Component Value Date   HGBA1C 7.7 04/03/2015   HGBA1C 9.1 12/06/2014   HGBA1C 9.7 08/09/2014     Assessment: Diabetes control: good control (HgbA1C at goal) Progress toward A1C goal:  improved Comments: Patient has been quite compliant w/ Metformin 500 mg bid, Lantus 44 units, and Prandin 1 mg tid. Average CBG's 143.   Plan: Medications:  continue current medications Home glucose monitoring: Frequency: once a day Timing: before breakfast Instruction/counseling given: discussed diet Educational resources provided: brochure (denial) Self management tools provided: copy of home glucose meter download Other plans: RTC in 2 months

## 2015-04-04 LAB — PRESCRIPTION ABUSE MONITORING 15P, URINE
Amphetamine/Meth: NEGATIVE ng/mL
BUPRENORPHINE, URINE: NEGATIVE ng/mL
Barbiturate Screen, Urine: NEGATIVE ng/mL
Benzodiazepine Screen, Urine: NEGATIVE ng/mL
CANNABINOID SCRN UR: NEGATIVE ng/mL
Carisoprodol, Urine: NEGATIVE ng/mL
Cocaine Metabolites: NEGATIVE ng/mL
Creatinine, Urine: 95.55 mg/dL (ref >20.0)
Fentanyl, Ur: NEGATIVE ng/mL
METHADONE SCREEN, URINE: NEGATIVE ng/mL
Meperidine, Ur: NEGATIVE ng/mL
OPIATE SCREEN, URINE: NEGATIVE ng/mL
OXYCODONE SCRN UR: NEGATIVE ng/mL
PROPOXYPHENE: NEGATIVE ng/mL
Zolpidem, Urine: NEGATIVE ng/mL

## 2015-04-06 ENCOUNTER — Other Ambulatory Visit: Payer: Self-pay | Admitting: Internal Medicine

## 2015-04-08 LAB — TRAMADOL, URINE
N-DESMETHYL-CIS-TRAMADOL: 2124 ng/mL — ABNORMAL HIGH (ref <100)
TRAMADOL, URINE: 2573 ng/mL — AB (ref <100)

## 2015-04-16 NOTE — Telephone Encounter (Signed)
Patient called with medication questions. Tried to call patient back but unable to reach.

## 2015-04-18 ENCOUNTER — Other Ambulatory Visit: Payer: Self-pay | Admitting: Internal Medicine

## 2015-04-24 ENCOUNTER — Other Ambulatory Visit: Payer: Self-pay | Admitting: Internal Medicine

## 2015-04-24 DIAGNOSIS — E1149 Type 2 diabetes mellitus with other diabetic neurological complication: Secondary | ICD-10-CM

## 2015-04-24 NOTE — Telephone Encounter (Signed)
Pharmacy [facility] [address]

## 2015-04-25 MED ORDER — INSULIN GLARGINE 100 UNIT/ML ~~LOC~~ SOLN: SUBCUTANEOUS | Status: DC

## 2015-05-23 ENCOUNTER — Encounter: Payer: Self-pay | Admitting: Internal Medicine

## 2015-06-05 ENCOUNTER — Other Ambulatory Visit: Payer: Self-pay | Admitting: Internal Medicine

## 2015-06-07 NOTE — Telephone Encounter (Signed)
Rx called in and pt called.  She will make appointment this month.

## 2015-07-16 ENCOUNTER — Encounter: Payer: Self-pay | Admitting: Gastroenterology

## 2015-07-17 ENCOUNTER — Other Ambulatory Visit: Payer: Self-pay | Admitting: Internal Medicine

## 2015-07-23 ENCOUNTER — Other Ambulatory Visit: Payer: Self-pay | Admitting: *Deleted

## 2015-07-23 MED ORDER — CARVEDILOL 25 MG PO TABS: 25.0000 mg | ORAL_TABLET | Freq: Two times a day (BID) | ORAL | Status: DC

## 2015-07-29 ENCOUNTER — Ambulatory Visit (INDEPENDENT_AMBULATORY_CARE_PROVIDER_SITE_OTHER): Payer: Medicare Other | Admitting: Internal Medicine

## 2015-07-29 ENCOUNTER — Encounter: Payer: Self-pay | Admitting: Internal Medicine

## 2015-07-29 VITALS — BP 141/82 | HR 76 | Temp 98.0°F | Wt 216.9 lb

## 2015-07-29 DIAGNOSIS — Z23 Encounter for immunization: Secondary | ICD-10-CM | POA: Diagnosis not present

## 2015-07-29 DIAGNOSIS — G8929 Other chronic pain: Secondary | ICD-10-CM

## 2015-07-29 DIAGNOSIS — I10 Essential (primary) hypertension: Secondary | ICD-10-CM

## 2015-07-29 DIAGNOSIS — Z7984 Long term (current) use of oral hypoglycemic drugs: Secondary | ICD-10-CM | POA: Diagnosis not present

## 2015-07-29 DIAGNOSIS — E559 Vitamin D deficiency, unspecified: Secondary | ICD-10-CM | POA: Diagnosis not present

## 2015-07-29 DIAGNOSIS — Z794 Long term (current) use of insulin: Secondary | ICD-10-CM

## 2015-07-29 DIAGNOSIS — F32A Depression, unspecified: Secondary | ICD-10-CM

## 2015-07-29 DIAGNOSIS — E1142 Type 2 diabetes mellitus with diabetic polyneuropathy: Secondary | ICD-10-CM

## 2015-07-29 DIAGNOSIS — M25561 Pain in right knee: Secondary | ICD-10-CM

## 2015-07-29 DIAGNOSIS — Z0000 Encounter for general adult medical examination without abnormal findings: Secondary | ICD-10-CM

## 2015-07-29 DIAGNOSIS — F329 Major depressive disorder, single episode, unspecified: Secondary | ICD-10-CM

## 2015-07-29 LAB — POCT GLYCOSYLATED HEMOGLOBIN (HGB A1C): HEMOGLOBIN A1C: 7.7

## 2015-07-29 LAB — GLUCOSE, CAPILLARY: GLUCOSE-CAPILLARY: 102 mg/dL — AB (ref 65–99)

## 2015-07-29 MED ORDER — METFORMIN HCL 500 MG PO TABS: ORAL_TABLET | ORAL | Status: DC

## 2015-07-29 MED ORDER — TRAMADOL HCL 50 MG PO TABS: 50.0000 mg | ORAL_TABLET | Freq: Four times a day (QID) | ORAL | Status: DC | PRN

## 2015-07-29 NOTE — Patient Instructions (Signed)

## 2015-07-29 NOTE — Assessment & Plan Note (Signed)
Depression symptoms have improved on Cymbalta 40mg  daily.   -Continue Cymbalta and reminded patient that this is a daily medication -Explained how this medication may also provide benefit regarding her diabetic neuropathy

## 2015-07-29 NOTE — Progress Notes (Signed)
Patient ID: Kristy [name], female   DOB: April 11, 1956, 59 y.o.   MRN: [id number]   Subjective:   Patient ID: Kristy [name] female   DOB: [birth year] 59 y.o.   MRN: [id number]  HPI: [name] is a 59 y.o. female with PMHx of HTN, HLD, type II DM, renal artery stenosis, OSA, and depression presents to the [facility] today for a follow-up visit regarding her HTN, DM, and chronic pain.  Patient was last seen in the clinic by Dr. Ronnald Ramp on June 8.  At that time her blood pressure was elevated at 155/95.  No changes were made to her medications at that time because patient had not taken her medications prior to her office visit.  Today, her blood pressure is still above goal at 141/82.  Patient states compliance with all her medications, including BP medications.  However, also states she has not taken all of her BP meds this morning.  Patient also here for follow up of her diabetes.  She reports good glycemic control with blood sugars ranging between 115-146 with an average of 131 according to her glucometer.  States she checks her glucose BID, however, her meter has not recorded anything for the past month and a half.   Patient also has chronic right knee pain secondary to knee replacement surgery that she reports Tramadol works well for.  She reports taking her Tramadol 50mg  once or twice daily for pain relief.  Patient has no other complaints today and reports feeling well.    Past Medical History  Diagnosis Date  . Hypertension   . Hyperlipidemia   . Diabetes mellitus   . 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 Albertine Patricia  . Sleep apnea 2007     status post polysomnogram 2007 , suggested use of CPAP  . Ventricular hypertrophy 2006     a 2-D echo in 2006, ejection fraction 55%, mild tricuspid  regurgitation noted as well, 2-D echo January 19, [id number] showed diastolic dysfunction with LVEF normal of 65%  . Colon polyps   . Colon polyps   . Diverticulosis    Current Outpatient Prescriptions  Medication Sig Dispense Refill  . amLODipine (NORVASC) 10 MG tablet TAKE ONE TABLET BY MOUTH DAILY 90 tablet 0  . aspirin 81 MG EC tablet Take 81 mg by mouth daily.      . benazepril (LOTENSIN) 40 MG tablet Take 1 tablet (40 mg total) by mouth daily. 90 tablet 1  . benazepril (LOTENSIN) 40 MG tablet TAKE ONE TABLET BY MOUTH ONCE DAILY 30 tablet 0  . carvedilol (COREG) 25 MG tablet Take 1 tablet (25 mg total) by mouth 2 (two) times daily. 180 tablet 0  . DULoxetine (CYMBALTA) 20 MG capsule Take 2 capsules (40 mg total) by mouth daily. 60 capsule 5  . furosemide (LASIX) 80 MG tablet Take 1.5 tablets daily 135 tablet 0  . glucose blood (RELION PRIME TEST) test strip To use in glucose monitor to obtain blood sugar 3 to 4 times daily. diag code 250.62. Insulin dependent 100 each 5  . glucose blood test strip 250.0 Use as instructed 100 each 12  . insulin glargine (LANTUS) 100 UNIT/ML injection INJECT 44 UNITS UNDER THE SKIN AT BEDTIME 10 vial 3  . Insulin Pen Needle (B-D  UF III MINI PEN NEEDLES) 31G X 5 MM MISC 1 Device by Does not apply route 3 (three) times daily before meals. Check daily 3X before meals 100 each 12  . Insulin Syringe-Needle U-100 (INSULIN SYRINGE .5CC/31GX5/16") 31G X 5/16" 0.5 ML MISC Inject 1 Dose into the skin daily. The patient is insulin requiring, ICD 10 code E11.42. The patient tests 2 times per day. 100 each 12  . metFORMIN (GLUCOPHAGE) 500 MG tablet Take 1 tablet (500 mg total) by mouth in the morning. Take 2 tablets (1000mg  total) by mouth in the evening. 90 tablet 5  . pravastatin (PRAVACHOL) 80 MG tablet TAKE ONE TABLET BY MOUTH DAILY AT NIGHT 90 tablet 0  . terazosin (HYTRIN) 2 MG capsule Take 1 capsule (2 mg total) by mouth at bedtime. 90 capsule 3  . repaglinide (PRANDIN)  1 MG tablet Take 1 tablet (1 mg total) by mouth 3 (three) times daily before meals. (Patient not taking: Reported on 07/29/2015) 90 tablet 1  . traMADol (ULTRAM) 50 MG tablet Take 1 tablet (50 mg total) by mouth every 6 (six) hours as needed. 120 tablet 0  . Vitamin D, Ergocalciferol, (DRISDOL) 50000 UNITS CAPS capsule Take 1 capsule (50,000 Units total) by mouth every 7 (seven) days. (Patient not taking: Reported on 07/29/2015) 8 capsule 0   No current facility-administered medications for this visit.   Family History  Problem Relation Age of Onset  . Diabetes Mother   . Cancer Mother   . Diabetes Father   . Stroke Neg Hx   . Cancer Other     breast cancer died in her 64s   Social History   Social History  . Marital Status: Married    Spouse Name: N/A  . Number of Children: N/A  . Years of Education: N/A   Social History Main Topics  . Smoking status: Former Smoker -- 0.33 packs/day for 25 years    Quit date: 10/26/2005  . Smokeless tobacco: Never Used  . Alcohol Use: 0.0 oz/week    0 Standard drinks or equivalent per week     Comment: occasional  . Drug Use: No  . Sexual Activity: Not Currently   Other Topics Concern  . None   Social History Narrative   Lives with husband and two sons.    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   Completed to 11th grade.   Review of Systems: Review of Systems  Constitutional: Negative for fever, chills and weight loss.  HENT: Negative for congestion, hearing loss and sore throat.   Eyes: Negative for blurred vision and pain.  Respiratory: Negative for cough, sputum production and shortness of breath.   Cardiovascular: Negative for chest pain, palpitations, claudication and leg swelling.  Gastrointestinal: Negative for nausea, vomiting, diarrhea and constipation.  Genitourinary: Negative for dysuria, frequency and flank pain.  Musculoskeletal: Positive for joint pain (right knee pain).  Skin: Negative for rash.    Neurological: Negative for dizziness and tingling.  Psychiatric/Behavioral: Negative for depression. The patient is not nervous/anxious.      Objective:  Physical Exam: Filed Vitals:   07/29/15 0832 07/29/15 0913  BP: 172/100 141/82  Pulse: 90 76  Temp: 98 F (36.7 C)   TempSrc: Oral   Weight: 216 lb 14.4 oz (98.385 kg)   SpO2: 99%    Physical Exam  Constitutional: She is oriented to person, place, and time. She appears well-developed and well-nourished. No distress.  HENT:  Head: Normocephalic  and atraumatic.  Right Ear: External ear normal.  Left Ear: External ear normal.  Eyes: Conjunctivae and EOM are normal.  Neck: Neck supple. No JVD present.  Cardiovascular: Normal rate, regular rhythm, normal heart sounds and intact distal pulses.   Pulmonary/Chest: Effort normal and breath sounds normal.  Abdominal: Soft. Bowel sounds are normal.  Musculoskeletal: She exhibits no edema or tenderness.  Right knee with no obvious swelling or deformity.  She does have visible scars from previous surgery. Left knee with no obvious swelling or deformity.  Lymphadenopathy:    She has no cervical adenopathy.  Neurological: She is alert and oriented to person, place, and time.  Skin: Skin is warm and dry.  Psychiatric: She has a normal mood and affect. Her behavior is normal.    Assessment & Plan:  Please see Problem List for Assessment and Plan.

## 2015-07-29 NOTE — Assessment & Plan Note (Addendum)
Vitamin D was low in February 2016 at 13, was supplemented with vitamin D 50,000 units every 7 days.  Patient reports that she took this prescription for 8 weeks but has not taken any supplementation since. -will check vitamin D today -depending on level will consider adding [phone] units daily   ADDENDUM 08/01/2015 -Vitamin D came back still low at 15.2 -Prescription sent to pharmacy for Vitamin D 1000mg  daily

## 2015-07-29 NOTE — Assessment & Plan Note (Signed)
-  Flu shot today -Diabetic foot exam done today -Needs Pap smear.  Will provide at next visit in 2 months.

## 2015-07-29 NOTE — Assessment & Plan Note (Addendum)
Lab Results  Component Value Date   HGBA1C 7.7 07/29/2015   HGBA1C 6.6 09/30/2010    Assessment:  Diabetes control: decent control  Progress toward A1C goal: same as last Hgb A1c  Comments: patient reports not taking her Prandin 1mg  TID, average CBGs 131 according to glucometer.  Her A1c is the same as 3 months ago.  Plans:  Medications: Continue Lantus 44 units daily.  Will change her Metformin dosing to be 500mg  in the AM and 1000mg  in the PM.  As she is not taking her Prandin, we will discontinue that medication and see how she tolerates the Metformin change.   On aspirin: Yes    On statin: Yes  On ACE-I/ ARB: Yes    Home glucose monitoring:   Frequency: BID, encouraged her to be consistent with this.   Timing: before meals  Other: discussed diet and exercise with helping her HTN and also discussed how this will help with her DM control as well.  Patient also has been taking Cymbalta 40mg  daily for depression and reports some improvement in her depression symptoms.  Explained how this medication can also help with her diabetic neuropathy as well.  Will also check urine microalbumin:creatine today.

## 2015-07-29 NOTE — Assessment & Plan Note (Signed)
BP Readings from Last 3 Encounters:  07/29/15 141/82  04/03/15 155/95  01/10/15 149/97       Component Value Date/Time   NA 139 01/10/2015 1534   K 3.9 01/10/2015 1534   CREATININE 1.11* 01/10/2015 1534   CREATININE 1.10 07/07/2011 0914    Assessment:  Blood pressure control: slightly elevated above goal of 130/90  Progress toward BP goal: improved from previous visit   Comments: has not taken all her medications this morning  Plan:  Medications: amlodipine 10mg  daily, benazepril 40mg  daily, carvedilol 25mg  BID, Lasix 120mg  daily, and Terazosin 2mg  qhs.  Will continue this current regimen for now and reassess at follow up visit.  Since she is already on 5 medications, if BP continues to be elevated, we will need to consider an additional agent or the addition of a stronger agent while stopping one of her current agents.  Other plans: encouraged exercise and diet modifications. Patient states she will make effort to go on daily walks even if just around her house.  RTC in 2 months.

## 2015-07-29 NOTE — Assessment & Plan Note (Signed)
Takes Tramadol for this and reports good pain relief on once or twice daily dosing.   -check UDS today, should be positive for Tramadol -refill Tramadol #120 for 2 months

## 2015-07-30 LAB — MICROALBUMIN / CREATININE URINE RATIO
Creatinine, Urine: 125.9 mg/dL
MICROALB/CREAT RATIO: 152.6 mg/g creat — ABNORMAL HIGH (ref 0.0–30.0)
MICROALBUM., U, RANDOM: 192.1 ug/mL

## 2015-07-30 LAB — VITAMIN D 25 HYDROXY (VIT D DEFICIENCY, FRACTURES): Vit D, 25-Hydroxy: 15.2 ng/mL — ABNORMAL LOW (ref 30.0–100.0)

## 2015-08-01 MED ORDER — CHOLECALCIFEROL 25 MCG (1000 UT) PO CAPS: 1000.0000 [IU] | ORAL_CAPSULE | Freq: Every day | ORAL | Status: DC

## 2015-08-01 NOTE — Addendum Note (Signed)
Addended by: Mignon Pine on: 08/01/2015 02:10 PM   Modules accepted: Orders

## 2015-08-02 NOTE — Progress Notes (Signed)
Internal Medicine Clinic Attending  I saw and evaluated the patient.  I personally confirmed the key portions of the history and exam documented by Dr. Wallace and I reviewed pertinent patient test results.  The assessment, diagnosis, and plan were formulated together and I agree with the documentation in the resident's note. 

## 2015-08-03 LAB — PRESCRIPTION ABUSE MONITORING 17P, URINE
6-Acetylmorphine, Urine: NEGATIVE ng/mL
Amphetamine Scrn, Ur: NEGATIVE ng/mL
BARBITURATE SCREEN URINE: NEGATIVE ng/mL
BENZODIAZEPINE SCREEN, URINE: NEGATIVE ng/mL
Buprenorphine, Urine: NEGATIVE ng/mL
CANNABINOIDS UR QL SCN: NEGATIVE ng/mL
CARISOPRODOL/MEPROBAMATE, UR: NEGATIVE ng/mL
Cocaine (Metab) Scrn, Ur: NEGATIVE ng/mL
Creatinine(Crt), U: 142.9 mg/dL (ref 20.0–300.0)
EDDP, Urine: NEGATIVE ng/mL
Fentanyl, Urine: NEGATIVE pg/mL
MDMA Screen, Urine: NEGATIVE ng/mL
METHADONE SCREEN, URINE: NEGATIVE ng/mL
Meperidine Screen, Urine: NEGATIVE ng/mL
Nitrite Urine, Quantitative: NEGATIVE ug/mL
OPIATE SCREEN URINE: NEGATIVE ng/mL
OXYCODONE+OXYMORPHONE UR QL SCN: NEGATIVE ng/mL
PHENCYCLIDINE QUANTITATIVE URINE: NEGATIVE ng/mL
Ph of Urine: 6.2 (ref 4.5–8.9)
Propoxyphene Scrn, Ur: NEGATIVE ng/mL
SPECIFIC GRAVITY: 1.017
TAPENTADOL, URINE: NEGATIVE ng/mL

## 2015-08-03 LAB — TRAMADOL (GC/MS), URINE
TRAMADOL: POSITIVE — AB
Tramadol (GC/MS): 6350 ng/mL

## 2015-08-22 ENCOUNTER — Other Ambulatory Visit: Payer: Self-pay | Admitting: Internal Medicine

## 2015-08-23 NOTE — Telephone Encounter (Signed)
Rx called in to pharmacy per Dr Juleen China. Hilda Blades Lydell Moga RN 08/23/15 11:45AM

## 2015-08-26 ENCOUNTER — Other Ambulatory Visit: Payer: Self-pay | Admitting: *Deleted

## 2015-08-26 DIAGNOSIS — F329 Major depressive disorder, single episode, unspecified: Secondary | ICD-10-CM

## 2015-08-26 DIAGNOSIS — I10 Essential (primary) hypertension: Secondary | ICD-10-CM

## 2015-08-26 DIAGNOSIS — E1149 Type 2 diabetes mellitus with other diabetic neurological complication: Secondary | ICD-10-CM

## 2015-08-26 DIAGNOSIS — F32A Depression, unspecified: Secondary | ICD-10-CM

## 2015-08-26 MED ORDER — AMLODIPINE BESYLATE 10 MG PO TABS: 10.0000 mg | ORAL_TABLET | Freq: Every day | ORAL | Status: DC

## 2015-08-26 MED ORDER — PRAVASTATIN SODIUM 80 MG PO TABS: 80.0000 mg | ORAL_TABLET | Freq: Every day | ORAL | Status: DC

## 2015-08-26 MED ORDER — CARVEDILOL 25 MG PO TABS: 25.0000 mg | ORAL_TABLET | Freq: Two times a day (BID) | ORAL | Status: DC

## 2015-08-26 MED ORDER — DULOXETINE HCL 20 MG PO CPEP: 40.0000 mg | ORAL_CAPSULE | Freq: Every day | ORAL | Status: DC

## 2015-08-26 MED ORDER — FUROSEMIDE 80 MG PO TABS: ORAL_TABLET | ORAL | Status: DC

## 2015-08-26 MED ORDER — INSULIN GLARGINE 100 UNIT/ML ~~LOC~~ SOLN: SUBCUTANEOUS | Status: DC

## 2015-08-27 ENCOUNTER — Other Ambulatory Visit: Payer: Self-pay | Admitting: Internal Medicine

## 2015-08-27 NOTE — Telephone Encounter (Signed)
Called [facility], they rec'd the scripts last pm, called pt she had not spoken to the pharmacy today

## 2015-08-27 NOTE — Telephone Encounter (Signed)
Pt states no meds at the [facility]. Please call pt back.

## 2015-10-17 ENCOUNTER — Other Ambulatory Visit: Payer: Self-pay | Admitting: *Deleted

## 2015-10-18 MED ORDER — TRAMADOL HCL 50 MG PO TABS: 50.0000 mg | ORAL_TABLET | Freq: Four times a day (QID) | ORAL | Status: DC | PRN

## 2015-10-22 ENCOUNTER — Telehealth: Payer: Self-pay

## 2015-10-22 NOTE — Telephone Encounter (Signed)
Pt called regarding tramadol rx.  Called pharmacy to ensure on file.  Pt aware.

## 2015-10-22 NOTE — Telephone Encounter (Signed)
Rx called in to pharmacy. 

## 2015-10-31 ENCOUNTER — Ambulatory Visit (INDEPENDENT_AMBULATORY_CARE_PROVIDER_SITE_OTHER): Payer: Medicare Other | Admitting: Internal Medicine

## 2015-10-31 ENCOUNTER — Encounter: Payer: Self-pay | Admitting: Dietician

## 2015-10-31 VITALS — BP 163/101 | HR 97 | Temp 98.1°F | Ht 63.0 in | Wt 216.5 lb

## 2015-10-31 DIAGNOSIS — Z0000 Encounter for general adult medical examination without abnormal findings: Secondary | ICD-10-CM

## 2015-10-31 DIAGNOSIS — F331 Major depressive disorder, recurrent, moderate: Secondary | ICD-10-CM

## 2015-10-31 DIAGNOSIS — Z794 Long term (current) use of insulin: Secondary | ICD-10-CM

## 2015-10-31 DIAGNOSIS — I10 Essential (primary) hypertension: Secondary | ICD-10-CM | POA: Diagnosis not present

## 2015-10-31 DIAGNOSIS — F321 Major depressive disorder, single episode, moderate: Secondary | ICD-10-CM

## 2015-10-31 DIAGNOSIS — Z79899 Other long term (current) drug therapy: Secondary | ICD-10-CM

## 2015-10-31 DIAGNOSIS — Z7984 Long term (current) use of oral hypoglycemic drugs: Secondary | ICD-10-CM | POA: Diagnosis not present

## 2015-10-31 DIAGNOSIS — E785 Hyperlipidemia, unspecified: Secondary | ICD-10-CM

## 2015-10-31 DIAGNOSIS — E1142 Type 2 diabetes mellitus with diabetic polyneuropathy: Secondary | ICD-10-CM | POA: Diagnosis not present

## 2015-10-31 LAB — GLUCOSE, CAPILLARY: GLUCOSE-CAPILLARY: 175 mg/dL — AB (ref 65–99)

## 2015-10-31 LAB — POCT GLYCOSYLATED HEMOGLOBIN (HGB A1C): HEMOGLOBIN A1C: 7.2

## 2015-10-31 MED ORDER — HYDROCHLOROTHIAZIDE 12.5 MG PO CAPS: 25.0000 mg | ORAL_CAPSULE | Freq: Every day | ORAL | Status: DC

## 2015-10-31 MED ORDER — DULOXETINE HCL 30 MG PO CPEP: 60.0000 mg | ORAL_CAPSULE | Freq: Every day | ORAL | Status: DC

## 2015-10-31 NOTE — Assessment & Plan Note (Signed)
Assessment/Plan: - due for Pap smear, given information on Pap smear clinic to have this done - due for eye exam, referral placed

## 2015-10-31 NOTE — Assessment & Plan Note (Signed)
Assessment: Previously, patient reported improved depression symptoms on Cymbalta 20mg  BID but today she reports worsening depression symptoms of feeling down some days and, most notably, not having the motivation to "get up and go".  Her depression may also be contributing to her non-compliance with other BP meds, too.  PHQ-9 filled out with score of 10 and noting that it is somewhat difficult for her to do work, take care of things at home, or get along with other people.  Plan: - Increase Cymbalta to 30mg  BID - Continue assessing at follow up, may need to change to different agent if depression not well controlled on SNRI - Consider CBT, counseling in the future as well

## 2015-10-31 NOTE — Assessment & Plan Note (Signed)
Assessment: Lipid panel from February 2016 with TC 202, HDL 47, LDL 122.  ASCVD 10-year risk is 29.7% and even 17% if BP was at goal.  Her recommendation is to be on a high-intensity statin.  Assessment: - continue Pravastatin 80mg  daily but consider switch to Atorvastatin

## 2015-10-31 NOTE — Patient Instructions (Signed)
Thank you for coming to see me today. It was a pleasure. Today we talked about:   1.  Blood Pressure: STOP taking Lasix.  START taking HCTZ 25mg  daily.  Keep a log of your blood pressures 3-4 times per week and take you medicine everyday.  Please follow up in 1 month to check your BP. 2.  Depression: We have increased your Cymbalta to 30mg  twice per day. 3.  Diabetes: You are doing a great job! I placed a referral today for your annual eye exam.  Please follow-up with me  in 1 month.  If you have any questions or concerns, please do not hesitate to call the office at [phone].  Take Care,   Jule Ser, DO

## 2015-10-31 NOTE — Progress Notes (Signed)
Patient ID: Kristy [name], female   DOB: 07-29-1956, 60 y.o.   MRN: [id number]   Subjective:   Patient ID: Kristy [name] female   DOB: 31-Aug-1956 60 y.o.   MRN: [id number]  HPI: [name] is a 60 y.o. female with past medical history as detailed below who presents today for follow up of her DM, HTN, depression.  Please see A&P for details of patients medical conditions discussed at today's visit.    Past Medical History  Diagnosis Date  . Hypertension   . Hyperlipidemia   . Diabetes mellitus   . 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 Albertine Patricia  . Sleep apnea 2007     status post polysomnogram 2007 , suggested use of CPAP  . Ventricular hypertrophy 2006     a 2-D echo in 2006, ejection fraction 55%, mild tricuspid regurgitation noted as well, 2-D echo January 19, [id number] showed diastolic dysfunction with LVEF normal of 65%  . Colon polyps   . Colon polyps   . Diverticulosis    Current Outpatient Prescriptions  Medication Sig Dispense Refill  . amLODipine (NORVASC) 10 MG tablet Take 1 tablet (10 mg total) by mouth daily. 90 tablet 3  . aspirin 81 MG EC tablet Take 81 mg by mouth daily.      . benazepril (LOTENSIN) 40 MG tablet TAKE ONE TABLET BY MOUTH ONCE DAILY 30 tablet 2  . carvedilol (COREG) 25 MG tablet Take 1 tablet (25 mg total) by mouth 2 (two) times daily. 180 tablet 3  . Cholecalciferol 1000 UNITS capsule Take 1 capsule (1,000 Units total) by mouth daily. 30 capsule 2  . DULoxetine (CYMBALTA) 30 MG capsule Take 2 capsules (60 mg total) by mouth daily. 180 capsule 2  . glucose blood (RELION PRIME TEST) test strip To use in glucose monitor to obtain blood sugar 3 to 4 times daily. diag code 250.62. Insulin dependent 100 each 5  . glucose blood test strip 250.0 Use  as instructed 100 each 12  . hydrochlorothiazide (MICROZIDE) 12.5 MG capsule Take 2 capsules (25 mg total) by mouth daily. 60 capsule 2  . insulin glargine (LANTUS) 100 UNIT/ML injection INJECT 44 UNITS UNDER THE SKIN AT BEDTIME 30 vial 3  . Insulin Pen Needle (B-D UF III MINI PEN NEEDLES) 31G X 5 MM MISC 1 Device by Does not apply route 3 (three) times daily before meals. Check daily 3X before meals 100 each 12  . Insulin Syringe-Needle U-100 (INSULIN SYRINGE .5CC/31GX5/16") 31G X 5/16" 0.5 ML MISC Inject 1 Dose into the skin daily. The patient is insulin requiring, ICD 10 code E11.42. The patient tests 2 times per day. 100 each 12  . metFORMIN (GLUCOPHAGE) 500 MG tablet Take 1 tablet (500 mg total) by mouth in the morning. Take 2 tablets (1000mg  total) by mouth in the evening. 90 tablet 5  . pravastatin (PRAVACHOL) 80 MG tablet Take 1 tablet (80 mg total) by mouth daily. 90 tablet 3  . repaglinide (PRANDIN) 1 MG tablet Take 1 tablet (1 mg total) by mouth 3 (three) times daily before meals. (Patient not taking: Reported on 07/29/2015) 90 tablet 1  . terazosin (HYTRIN) 2 MG capsule Take 1 capsule (2 mg total) by mouth at bedtime. 90 capsule 3  . traMADol (  ULTRAM) 50 MG tablet Take 1 tablet (50 mg total) by mouth every 6 (six) hours as needed. 120 tablet 1  . Vitamin D, Ergocalciferol, (DRISDOL) 50000 UNITS CAPS capsule Take 1 capsule (50,000 Units total) by mouth every 7 (seven) days. (Patient not taking: Reported on 07/29/2015) 8 capsule 0   No current facility-administered medications for this visit.   Family History  Problem Relation Age of Onset  . Diabetes Mother   . Cancer Mother   . Diabetes Father   . Stroke Neg Hx   . Cancer Other     breast cancer died in her 44s   Social History   Social History  . Marital Status: Married    Spouse Name: N/A  . Number of Children: N/A  . Years of Education: 37   Social History Main Topics  . Smoking status: Former Smoker -- 0.33 packs/day for  25 years    Quit date: 10/26/2005  . Smokeless tobacco: Never Used  . Alcohol Use: 0.0 oz/week    0 Standard drinks or equivalent per week     Comment: occasional  . Drug Use: No  . Sexual Activity: Not Currently   Other Topics Concern  . Not on file   Social History Narrative   Lives with husband and two sons.    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   Completed to 11th grade.   Review of Systems: Review of Systems  Constitutional: Negative for fever and chills.  HENT: Negative for congestion and sore throat.   Eyes: Negative for blurred vision and pain.  Respiratory: Negative for cough and shortness of breath.   Cardiovascular: Negative for chest pain and leg swelling.  Gastrointestinal: Negative for nausea and vomiting.  Genitourinary: Negative for dysuria.  Musculoskeletal: Positive for joint pain. Negative for falls.  Neurological: Negative for dizziness, loss of consciousness and headaches.  Psychiatric/Behavioral: Positive for depression. Negative for suicidal ideas. The patient does not have insomnia.     Objective:  Physical Exam: Filed Vitals:   10/31/15 1426  BP: 163/101  Pulse: 97  Temp: 98.1 F (36.7 C)  TempSrc: Oral  Height: 5\' 3"  (1.6 m)  Weight: 216 lb 8 oz (98.204 kg)  SpO2: 100%   Physical Exam  Constitutional: She is oriented to person, place, and time. She appears well-developed and well-nourished.  HENT:  Head: Normocephalic and atraumatic.  Eyes: EOM are normal.  Neck: Normal range of motion.  Cardiovascular: Normal rate, regular rhythm and normal heart sounds.   No murmur heard. Pulmonary/Chest: Effort normal and breath sounds normal.  Musculoskeletal: Normal range of motion.  Neurological: She is alert and oriented to person, place, and time.  Psychiatric: She has a normal mood and affect. Thought content normal.    Assessment & Plan:   Please see problem list for assessment and plan.  Case discussed with Dr.  Eppie Gibson.

## 2015-10-31 NOTE — Assessment & Plan Note (Signed)
Assessment: Patient is on 5 BP medicines prior to visit today: Terazosin 2mg  QHS, Lasix 120mg  daily, Carvedilol 25mg  BID, Amlodipine 10mg  daily, and Benazepril 40mg  daily.  She has had elevated blood pressures in the past as well as worsening microalbumin/creatine ratio.  Today her BP is 163/101.  She did not take her BP meds this morning because she states they make her tired.  I have asked her on prior visit to take BP meds as directed even on days she is to see doctor because it helps to determine how adequate her BP regimen is.  Her worsening microalbuminuria and elevated BP concern me that she is non-compliant on other days as well.  She does not check her BP regularly outside of doctor visits.  Plan: - Continue Terazosin, Carvedilol, Benazepril, and Amlodipine at current doses - STOP Lasix - START HCTZ 25mg  daily.  Patient previously has been on this and we are hopeful this will enable her to get better BP control - RTC in 1 month for BP check, BMET - Gave patient BP log and asked her to check her pressures 3-4 times per week at home of grocery store

## 2015-10-31 NOTE — Assessment & Plan Note (Signed)
Lab Results  Component Value Date   HGBA1C 7.2 10/31/2015   HGBA1C 6.6 09/30/2010    Assessment:  Diabetes control: decent control  Progress toward A1C goal: improved from 3 months ago  Comments: patient reports not taking her Prandin 1mg  TID, but does take metformin and Lantus as directed.  She did not bring meter today.  Plans:  Medications: Continue Lantus 44 units daily.  Will continue her Metformin dosing of 500mg  in the AM and 1000mg  in the PM.  Discontinue Prandin since she is not taking.  May consider going up on Metformin if A1C above goal in 3 months.   On aspirin: Yes    On statin: Yes  On ACE-I/ ARB: Yes    Home glucose monitoring:   Frequency: BID, encouraged her to be consistent with this.   Timing: before meals  Other: discussed diet and exercise with helping her HTN and also discussed how this will help with her DM control as well.

## 2015-11-02 NOTE — Progress Notes (Signed)
I saw and evaluated the patient.  I personally confirmed the key portions of Dr. Wallace's history and exam and reviewed pertinent patient test results.  The assessment, diagnosis, and plan were formulated together and I agree with the documentation in the resident's note. 

## 2015-11-02 NOTE — Addendum Note (Signed)
Addended by: Oval Linsey D on: 11/02/2015 09:03 AM   Modules accepted: Medications

## 2015-11-28 ENCOUNTER — Encounter: Payer: Self-pay | Admitting: Internal Medicine

## 2015-12-18 ENCOUNTER — Other Ambulatory Visit: Payer: Self-pay | Admitting: Internal Medicine

## 2015-12-18 NOTE — Telephone Encounter (Signed)
Pt asking for refill for tramadol

## 2015-12-20 MED ORDER — TRAMADOL HCL 50 MG PO TABS: 50.0000 mg | ORAL_TABLET | Freq: Four times a day (QID) | ORAL | Status: DC | PRN

## 2015-12-20 NOTE — Telephone Encounter (Signed)
Called in.

## 2016-01-06 ENCOUNTER — Other Ambulatory Visit: Payer: Self-pay | Admitting: Internal Medicine

## 2016-01-06 ENCOUNTER — Other Ambulatory Visit: Payer: Self-pay

## 2016-01-06 DIAGNOSIS — Z1231 Encounter for screening mammogram for malignant neoplasm of breast: Secondary | ICD-10-CM

## 2016-01-14 ENCOUNTER — Other Ambulatory Visit: Payer: Self-pay | Admitting: *Deleted

## 2016-01-14 DIAGNOSIS — I10 Essential (primary) hypertension: Secondary | ICD-10-CM

## 2016-01-15 MED ORDER — HYDROCHLOROTHIAZIDE 12.5 MG PO CAPS: 25.0000 mg | ORAL_CAPSULE | Freq: Every day | ORAL | Status: DC

## 2016-01-15 MED ORDER — BENAZEPRIL HCL 40 MG PO TABS: 40.0000 mg | ORAL_TABLET | Freq: Every day | ORAL | Status: DC

## 2016-01-22 ENCOUNTER — Ambulatory Visit
Admission: RE | Admit: 2016-01-22 | Discharge: 2016-01-22 | Disposition: A | Discharge status: Home / Self Care | Payer: Medicare Other | Source: Ambulatory Visit

## 2016-01-22 DIAGNOSIS — Z1231 Encounter for screening mammogram for malignant neoplasm of breast: Secondary | ICD-10-CM

## 2016-01-27 ENCOUNTER — Other Ambulatory Visit: Payer: Self-pay

## 2016-01-28 MED ORDER — "INSULIN SYRINGE 31G X 5/16"" 0.5 ML MISC": 1.0000 | Freq: Every day | Status: DC

## 2016-01-29 ENCOUNTER — Ambulatory Visit (INDEPENDENT_AMBULATORY_CARE_PROVIDER_SITE_OTHER): Payer: Medicare Other | Admitting: Internal Medicine

## 2016-01-29 ENCOUNTER — Encounter: Payer: Self-pay | Admitting: Internal Medicine

## 2016-01-29 VITALS — BP 160/77 | HR 102 | Temp 98.1°F | Ht 63.0 in | Wt 215.6 lb

## 2016-01-29 DIAGNOSIS — F321 Major depressive disorder, single episode, moderate: Secondary | ICD-10-CM | POA: Diagnosis not present

## 2016-01-29 DIAGNOSIS — E785 Hyperlipidemia, unspecified: Secondary | ICD-10-CM

## 2016-01-29 DIAGNOSIS — I10 Essential (primary) hypertension: Secondary | ICD-10-CM

## 2016-01-29 DIAGNOSIS — Z79899 Other long term (current) drug therapy: Secondary | ICD-10-CM

## 2016-01-29 DIAGNOSIS — E1142 Type 2 diabetes mellitus with diabetic polyneuropathy: Secondary | ICD-10-CM

## 2016-01-29 DIAGNOSIS — Z794 Long term (current) use of insulin: Secondary | ICD-10-CM | POA: Diagnosis not present

## 2016-01-29 LAB — POCT GLYCOSYLATED HEMOGLOBIN (HGB A1C): HEMOGLOBIN A1C: 7.5

## 2016-01-29 LAB — GLUCOSE, CAPILLARY: Glucose-Capillary: 123 mg/dL — ABNORMAL HIGH (ref 65–99)

## 2016-01-29 NOTE — Assessment & Plan Note (Signed)
-  check lipid panel  

## 2016-01-29 NOTE — Assessment & Plan Note (Signed)
Pt reports depression improved since increasing cymbalta to 60mg  daily. -cont current med

## 2016-01-29 NOTE — Patient Instructions (Signed)
Thank you for your visit today.   Please return to the internal medicine clinic in 1 month(s) or sooner if needed.     I have made the following additions/changes to your medications:  Continue current medications  You need an eye exam.  You need to have a pap smear.   Please be sure to bring all of your medications with you to every visit; this includes herbal supplements, vitamins, eye drops, and any over-the-counter medications.   Should you have any questions regarding your medications and/or any new or worsening symptoms, please be sure to call the clinic at [phone].   If you believe that you are suffering from a life threatening condition or one that may result in the loss of limb or function, then you should call 911 and proceed to the nearest Emergency Department.   A healthy lifestyle and preventative care can promote health and wellness.   Maintain regular health, dental, and eye exams.  Eat a healthy diet. Foods like vegetables, fruits, whole grains, low-fat dairy products, and lean protein foods contain the nutrients you need without too many calories. Decrease your intake of foods high in solid fats, added sugars, and salt. Get information about a proper diet from your caregiver, if necessary.  Regular physical exercise is one of the most important things you can do for your health. Most adults should get at least 150 minutes of moderate-intensity exercise (any activity that increases your heart rate and causes you to sweat) each week. In addition, most adults need muscle-strengthening exercises on 2 or more days a week.   Maintain a healthy weight. The body mass index (BMI) is a screening tool to identify possible weight problems. It provides an estimate of body fat based on height and weight. Your caregiver can help determine your BMI, and can help you achieve or maintain a healthy weight. For adults 20 years and older:  A BMI below 18.5 is considered  underweight.  A BMI of 18.5 to 24.9 is normal.  A BMI of 25 to 29.9 is considered overweight.  A BMI of 30 and above is considered obese. DASH Eating Plan DASH stands for "Dietary Approaches to Stop Hypertension." The DASH eating plan is a healthy eating plan that has been shown to reduce high blood pressure (hypertension). Additional health benefits may include reducing the risk of type 2 diabetes mellitus, heart disease, and stroke. The DASH eating plan may also help with weight loss. WHAT DO I NEED TO KNOW ABOUT THE DASH EATING PLAN? For the DASH eating plan, you will follow these general guidelines:  Choose foods with a percent daily value for sodium of less than 5% (as listed on the food label).  Use salt-free seasonings or herbs instead of table salt or sea salt.  Check with your health care provider or pharmacist before using salt substitutes.  Eat lower-sodium products, often labeled as "lower sodium" or "no salt added."  Eat fresh foods.  Eat more vegetables, fruits, and low-fat dairy products.  Choose whole grains. Look for the word "whole" as the first word in the ingredient list.  Choose fish and skinless chicken or [place] more often than red meat. Limit fish, poultry, and meat to 6 oz (170 g) each day.  Limit sweets, desserts, sugars, and sugary drinks.  Choose heart-healthy fats.  Limit cheese to 1 oz (28 g) per day.  Eat more home-cooked food and less restaurant, buffet, and fast food.  Limit fried foods.  Cook foods using  methods other than frying.  Limit canned vegetables. If you do use them, rinse them well to decrease the sodium.  When eating at a restaurant, ask that your food be prepared with less salt, or no salt if possible. WHAT FOODS CAN I EAT? Seek help from a dietitian for individual calorie needs. Grains Whole grain or whole wheat bread. Brown rice. Whole grain or whole wheat pasta. Quinoa, bulgur, and whole grain cereals. Low-sodium cereals.  Corn or whole wheat flour tortillas. Whole grain cornbread. Whole grain crackers. Low-sodium crackers. Vegetables Fresh or frozen vegetables (raw, steamed, roasted, or grilled). Low-sodium or reduced-sodium tomato and vegetable juices. Low-sodium or reduced-sodium tomato sauce and paste. Low-sodium or reduced-sodium canned vegetables.  Fruits All fresh, canned (in natural juice), or frozen fruits. Meat and Other Protein Products Ground beef (85% or leaner), grass-fed beef, or beef trimmed of fat. Skinless chicken or [place]. Ground chicken or [place]. Pork trimmed of fat. All fish and seafood. Eggs. Dried beans, peas, or lentils. Unsalted nuts and seeds. Unsalted canned beans. Dairy Low-fat dairy products, such as skim or 1% milk, 2% or reduced-fat cheeses, low-fat ricotta or cottage cheese, or plain low-fat yogurt. Low-sodium or reduced-sodium cheeses. Fats and Oils Tub margarines without trans fats. Light or reduced-fat mayonnaise and salad dressings (reduced sodium). Avocado. Safflower, olive, or canola oils. Natural peanut or almond butter. Other Unsalted popcorn and pretzels. The items listed above may not be a complete list of recommended foods or beverages. Contact your dietitian for more options. WHAT FOODS ARE NOT RECOMMENDED? Grains White bread. White pasta. White rice. Refined cornbread. Bagels and croissants. Crackers that contain trans fat. Vegetables Creamed or fried vegetables. Vegetables in a cheese sauce. Regular canned vegetables. Regular canned tomato sauce and paste. Regular tomato and vegetable juices. Fruits Dried fruits. Canned fruit in light or heavy syrup. Fruit juice. Meat and Other Protein Products Fatty cuts of meat. Ribs, chicken wings, bacon, sausage, bologna, salami, chitterlings, fatback, hot dogs, bratwurst, and packaged luncheon meats. Salted nuts and seeds. Canned beans with salt. Dairy Whole or 2% milk, cream, half-and-half, and cream cheese. Whole-fat or  sweetened yogurt. Full-fat cheeses or blue cheese. Nondairy creamers and whipped toppings. Processed cheese, cheese spreads, or cheese curds. Condiments Onion and garlic salt, seasoned salt, table salt, and sea salt. Canned and packaged gravies. Worcestershire sauce. Tartar sauce. Barbecue sauce. Teriyaki sauce. Soy sauce, including reduced sodium. Steak sauce. Fish sauce. Oyster sauce. Cocktail sauce. Horseradish. Ketchup and mustard. Meat flavorings and tenderizers. Bouillon cubes. Hot sauce. Tabasco sauce. Marinades. Taco seasonings. Relishes. Fats and Oils Butter, stick margarine, lard, shortening, ghee, and bacon fat. Coconut, palm kernel, or palm oils. Regular salad dressings. Other Pickles and olives. Salted popcorn and pretzels. The items listed above may not be a complete list of foods and beverages to avoid. Contact your dietitian for more information. WHERE CAN I FIND MORE INFORMATION? National Heart, Lung, and Blood Institute: travelstabloid.com   This information is not intended to replace advice given to you by your health care provider. Make sure you discuss any questions you have with your health care provider.   Document Released: 10/01/2011 Document Revised: 11/02/2014 Document Reviewed: 08/16/2013 Elsevier Interactive Patient Education [employer].

## 2016-01-29 NOTE — Progress Notes (Signed)
Patient ID: KALLIN BOLINSKY, female   DOB: 09-24-1956, 60 y.o.   MRN: [id number]     Subjective:   Patient ID: LAINEE WISHON female    DOB: 03-08-1956 60 y.o.    MRN: [id number] Health Maintenance Due: Health Maintenance Due  Topic Date Due  . PAP SMEAR  05/06/1977  . OPHTHALMOLOGY EXAM  06/15/2015  . LIPID PANEL  12/18/2015  . HEMOGLOBIN A1C  01/29/2016    _________________________________________________  HPI: Ms.Cyrah AIMAN SCHLIES is a 60 y.o. female here for a follow up visit for HTN.  Pt has a PMH outlined below.  Please see problem-based charting assessment and plan for further status of patient's chronic medical problems addressed at today's visit.  PMH: Past Medical History  Diagnosis Date  . Hypertension   . Hyperlipidemia   . Diabetes mellitus   . 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 Albertine Patricia  . Sleep apnea 2007     status post polysomnogram 2007 , suggested use of CPAP  . Ventricular hypertrophy 2006     a 2-D echo in 2006, ejection fraction 55%, mild tricuspid regurgitation noted as well, 2-D echo January 19, [id number] showed diastolic dysfunction with LVEF normal of 65%  . Colon polyps   . Colon polyps   . Diverticulosis     Medications: Current Outpatient Prescriptions on File Prior to Visit  Medication Sig Dispense Refill  . amLODipine (NORVASC) 10 MG tablet Take 1 tablet (10 mg total) by mouth daily. 90 tablet 3  . aspirin 81 MG EC tablet Take 81 mg by mouth daily.      . benazepril (LOTENSIN) 40 MG tablet Take 1 tablet (40 mg total) by mouth daily. 90 tablet 1  . carvedilol (COREG) 25 MG tablet Take 1 tablet (25 mg total) by mouth 2 (two) times daily. 180 tablet 3  . Cholecalciferol 1000 UNITS capsule Take 1 capsule (1,000 Units total) by mouth daily. 30  capsule 2  . DULoxetine (CYMBALTA) 30 MG capsule Take 2 capsules (60 mg total) by mouth daily. 180 capsule 2  . glucose blood (RELION PRIME TEST) test strip To use in glucose monitor to obtain blood sugar 3 to 4 times daily. diag code 250.62. Insulin dependent 100 each 5  . glucose blood test strip 250.0 Use as instructed 100 each 12  . hydrochlorothiazide (MICROZIDE) 12.5 MG capsule Take 2 capsules (25 mg total) by mouth daily. 180 capsule 1  . insulin glargine (LANTUS) 100 UNIT/ML injection INJECT 44 UNITS UNDER THE SKIN AT BEDTIME 30 vial 3  . Insulin Pen Needle (B-D UF III MINI PEN NEEDLES) 31G X 5 MM MISC 1 Device by Does not apply route 3 (three) times daily before meals. Check daily 3X before meals 100 each 12  . Insulin Syringe-Needle U-100 (INSULIN SYRINGE .5CC/31GX5/16") 31G X 5/16" 0.5 ML MISC Inject 1 Dose into the skin daily. The patient is insulin requiring, ICD 10 code E11.42. The patient tests 2 times per day. 100 each 12  . metFORMIN (GLUCOPHAGE) 500 MG tablet Take 1 tablet (500 mg total) by mouth in the morning. Take 2 tablets (1000mg  total) by mouth in the evening. 90 tablet 5  . pravastatin (PRAVACHOL) 80 MG tablet Take 1 tablet (80 mg total) by mouth daily. 90 tablet 3  .  terazosin (HYTRIN) 2 MG capsule Take 1 capsule (2 mg total) by mouth at bedtime. 90 capsule 3  . traMADol (ULTRAM) 50 MG tablet Take 1 tablet (50 mg total) by mouth every 6 (six) hours as needed. 120 tablet 1  . Vitamin D, Ergocalciferol, (DRISDOL) 50000 UNITS CAPS capsule Take 1 capsule (50,000 Units total) by mouth every 7 (seven) days. (Patient not taking: Reported on 07/29/2015) 8 capsule 0   No current facility-administered medications on file prior to visit.    Allergies: No Known Allergies  FH: Family History  Problem Relation Age of Onset  . Diabetes Mother   . Cancer Mother   . Diabetes Father   . Stroke Neg Hx   . Cancer Other     breast cancer died in her 56s    [place]: Social History    Social History  . Marital Status: Married    Spouse Name: N/A  . Number of Children: N/A  . Years of Education: 40   Social History Main Topics  . Smoking status: Former Smoker -- 0.33 packs/day for 25 years    Quit date: 10/26/2005  . Smokeless tobacco: Never Used  . Alcohol Use: 0.0 oz/week    0 Standard drinks or equivalent per week     Comment: occasional  . Drug Use: No  . Sexual Activity: Not Currently   Other Topics Concern  . None   Social History Narrative   Lives with husband and two sons.    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   Completed to 11th grade.    Review of Systems: Constitutional: Negative for fever, chills and weight loss.  Eyes: Negative for blurred vision.  Respiratory: Negative for cough and shortness of breath.  Cardiovascular: Negative for chest pain, palpitations and +leg swelling.  Gastrointestinal: Negative for nausea, vomiting, abdominal pain, diarrhea, constipation and blood in stool.  Genitourinary: Negative for dysuria, urgency and frequency.  Musculoskeletal: Negative for myalgias and back pain.  Neurological: Negative for dizziness, weakness and headaches.     Objective:   Vital Signs: Filed Vitals:   01/29/16 1409  BP: 160/77  Pulse: 102  Temp: 98.1 F (36.7 C)  TempSrc: Oral  Height: 5\' 3"  (1.6 m)  Weight: 215 lb 9.6 oz (97.796 kg)  SpO2: 100%      BP Readings from Last 3 Encounters:  01/29/16 160/77  10/31/15 163/101  07/29/15 141/82    Physical Exam: Constitutional: Vital signs reviewed.  Patient is in NAD and cooperative with exam.  Head: Normocephalic and atraumatic. Eyes: EOMI, conjunctivae nl, no scleral icterus.  Neck: Supple. Cardiovascular: RRR, no MRG. Pulmonary/Chest: normal effort, CTAB, no wheezes, rales, or rhonchi. Abdominal: Soft. NT/ND +BS. Neurological: A&O x3, cranial nerves II-XII are grossly intact, moving all extremities. Extremities: 2+DP b/l; trace LE edema. Skin:  Warm, dry and intact. No rash.   Assessment & Plan:   Assessment and plan was discussed and formulated with my attending.

## 2016-01-29 NOTE — Assessment & Plan Note (Addendum)
-  check HA1c today  -check lipid panel -reminded her to schedule an eye appointment

## 2016-01-30 LAB — LIPID PANEL
CHOL/HDL RATIO: 4.4 ratio (ref 0.0–4.4)
Cholesterol, Total: 196 mg/dL (ref 100–199)
HDL: 45 mg/dL (ref >39)
LDL CALC: 127 mg/dL — AB (ref 0–99)
Triglycerides: 119 mg/dL (ref 0–149)
VLDL CHOLESTEROL CAL: 24 mg/dL (ref 5–40)

## 2016-02-13 ENCOUNTER — Other Ambulatory Visit: Payer: Self-pay | Admitting: Internal Medicine

## 2016-02-13 MED ORDER — TRAMADOL HCL 50 MG PO TABS: 50.0000 mg | ORAL_TABLET | Freq: Four times a day (QID) | ORAL | Status: DC | PRN

## 2016-02-13 NOTE — Telephone Encounter (Signed)
Tramadol refill [facility]

## 2016-02-13 NOTE — Telephone Encounter (Signed)
Based on refill hx refill would be due in next few days.  Last OV 4/5 and has upcoming apt in May

## 2016-02-14 NOTE — Telephone Encounter (Signed)
rx called in- will inform patient

## 2016-02-24 NOTE — Progress Notes (Signed)
Internal Medicine Clinic Attending  Case discussed with Dr. Gill soon after the resident saw the patient.  We reviewed the resident's history and exam and pertinent patient test results.  I agree with the assessment, diagnosis, and plan of care documented in the resident's note.  

## 2016-03-10 ENCOUNTER — Ambulatory Visit (INDEPENDENT_AMBULATORY_CARE_PROVIDER_SITE_OTHER): Payer: Medicare Other | Admitting: Internal Medicine

## 2016-03-10 ENCOUNTER — Encounter: Payer: Self-pay | Admitting: Internal Medicine

## 2016-03-10 VITALS — BP 152/93 | HR 93 | Temp 98.0°F | Resp 18 | Ht 63.0 in | Wt 217.3 lb

## 2016-03-10 DIAGNOSIS — E559 Vitamin D deficiency, unspecified: Secondary | ICD-10-CM | POA: Diagnosis not present

## 2016-03-10 DIAGNOSIS — E1142 Type 2 diabetes mellitus with diabetic polyneuropathy: Secondary | ICD-10-CM | POA: Diagnosis not present

## 2016-03-10 DIAGNOSIS — F321 Major depressive disorder, single episode, moderate: Secondary | ICD-10-CM

## 2016-03-10 DIAGNOSIS — Z0000 Encounter for general adult medical examination without abnormal findings: Secondary | ICD-10-CM

## 2016-03-10 DIAGNOSIS — I10 Essential (primary) hypertension: Secondary | ICD-10-CM

## 2016-03-10 MED ORDER — CHOLECALCIFEROL 25 MCG (1000 UT) PO CAPS: 1000.0000 [IU] | ORAL_CAPSULE | Freq: Every day | ORAL | Status: DC

## 2016-03-10 MED ORDER — OLMESARTAN-AMLODIPINE-HCTZ 40-10-25 MG PO TABS: 1.0000 | ORAL_TABLET | Freq: Every day | ORAL | Status: DC

## 2016-03-10 MED ORDER — DULOXETINE HCL 60 MG PO CPEP: 60.0000 mg | ORAL_CAPSULE | Freq: Every day | ORAL | Status: DC

## 2016-03-10 NOTE — Assessment & Plan Note (Signed)
We also discussed needing a pap smear.  Kristy [name] will send her information regarding this.

## 2016-03-10 NOTE — Assessment & Plan Note (Signed)
She is currently complaint with regimen.   -reminded her to get an eye exam

## 2016-03-10 NOTE — Assessment & Plan Note (Addendum)
BP today 152/93, she did not take her meds this AM because it makes her feel bad.  She brought in a BP log and mostly good readings below 140/90.   -I will d/c HCTZ, amlodpine, benazepril, and terazosin due to pill burden -will add olmesartan, amlodipine, HCTZ combination pill -she will continue on carvedilol for now  -f/u in ~8 weeks and will need repeat BMP at that time

## 2016-03-10 NOTE — Patient Instructions (Signed)
Thank you for your visit today.   Please return to the internal medicine clinic in about 8 weeks or sooner if needed.     I have made the following additions/changes to your medications:  Please discontinue amlodipine, benazepril, hydrochlorothiazide, and terazosin. I have replaced these with a combination pill that you should take daily.  It is a combination of olmesartan, amlodipine, and hydrochlorothiazide.  Please continue carvedilol for now.    Please be sure to bring all of your medications with you to every visit; this includes herbal supplements, vitamins, eye drops, and any over-the-counter medications.   Should you have any questions regarding your medications and/or any new or worsening symptoms, please be sure to call the clinic at [phone].   If you believe that you are suffering from a life threatening condition or one that may result in the loss of limb or function, then you should call 911 and proceed to the nearest Emergency Department.   A healthy lifestyle and preventative care can promote health and wellness.   Maintain regular health, dental, and eye exams.  Eat a healthy diet. Foods like vegetables, fruits, whole grains, low-fat dairy products, and lean protein foods contain the nutrients you need without too many calories. Decrease your intake of foods high in solid fats, added sugars, and salt. Get information about a proper diet from your caregiver, if necessary.  Regular physical exercise is one of the most important things you can do for your health. Most adults should get at least 150 minutes of moderate-intensity exercise (any activity that increases your heart rate and causes you to sweat) each week. In addition, most adults need muscle-strengthening exercises on 2 or more days a week.   Maintain a healthy weight. The body mass index (BMI) is a screening tool to identify possible weight problems. It provides an estimate of body fat based on height and  weight. Your caregiver can help determine your BMI, and can help you achieve or maintain a healthy weight. For adults 20 years and older:  A BMI below 18.5 is considered underweight.  A BMI of 18.5 to 24.9 is normal.  A BMI of 25 to 29.9 is considered overweight.  A BMI of 30 and above is considered obese.

## 2016-03-10 NOTE — Progress Notes (Signed)
Patient ID: Kristy [name], female   DOB: 03-Feb-1956, 60 y.o.   MRN: [id number]     Subjective:   Patient ID: Kristy [name] female    DOB: Apr 08, 1956 60 y.o.    MRN: [id number] Health Maintenance Due: Health Maintenance Due  Topic Date Due  . PAP SMEAR  05/06/1977  . OPHTHALMOLOGY EXAM  06/15/2015    _________________________________________________  HPI: [name] is a 60 y.o. female here for HTN f/u.  Pt has a PMH outlined below.  Please see problem-based charting assessment and plan for further status of patient's chronic medical problems addressed at today's visit.  PMH: Past Medical History  Diagnosis Date  . Hypertension   . Hyperlipidemia   . Diabetes mellitus   . 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 Albertine Patricia  . Sleep apnea 2007     status post polysomnogram 2007 , suggested use of CPAP  . Ventricular hypertrophy 2006     a 2-D echo in 2006, ejection fraction 55%, mild tricuspid regurgitation noted as well, 2-D echo January 19, [id number] showed diastolic dysfunction with LVEF normal of 65%  . Colon polyps   . Colon polyps   . Diverticulosis     Medications: Current Outpatient Prescriptions on File Prior to Visit  Medication Sig Dispense Refill  . aspirin 81 MG EC tablet Take 81 mg by mouth daily.      . carvedilol (COREG) 25 MG tablet Take 1 tablet (25 mg total) by mouth 2 (two) times daily. 180 tablet 3  . glucose blood (RELION PRIME TEST) test strip To use in glucose monitor to obtain blood sugar 3 to 4 times daily. diag code 250.62. Insulin dependent 100 each 5  . glucose blood test strip 250.0 Use as instructed 100 each 12  . insulin glargine (LANTUS) 100 UNIT/ML injection INJECT 44 UNITS UNDER THE SKIN AT BEDTIME 30 vial 3  . Insulin Pen Needle  (B-D UF III MINI PEN NEEDLES) 31G X 5 MM MISC 1 Device by Does not apply route 3 (three) times daily before meals. Check daily 3X before meals 100 each 12  . Insulin Syringe-Needle U-100 (INSULIN SYRINGE .5CC/31GX5/16") 31G X 5/16" 0.5 ML MISC Inject 1 Dose into the skin daily. The patient is insulin requiring, ICD 10 code E11.42. The patient tests 2 times per day. 100 each 12  . metFORMIN (GLUCOPHAGE) 500 MG tablet Take 1 tablet (500 mg total) by mouth in the morning. Take 2 tablets (1000mg  total) by mouth in the evening. 90 tablet 5  . pravastatin (PRAVACHOL) 80 MG tablet Take 1 tablet (80 mg total) by mouth daily. 90 tablet 3  . traMADol (ULTRAM) 50 MG tablet Take 1 tablet (50 mg total) by mouth every 6 (six) hours as needed. 120 tablet 1   No current facility-administered medications on file prior to visit.    Allergies: No Known Allergies  FH: Family History  Problem Relation Age of Onset  . Diabetes Mother   . Cancer Mother   . Diabetes Father   . Stroke Neg Hx   . Cancer Other     breast cancer died in her 67s    [place]: Social History   Social History  . Marital Status: Married    Spouse Name: N/A  .  Number of Children: N/A  . Years of Education: 31   Social History Main Topics  . Smoking status: Former Smoker -- 0.33 packs/day for 25 years    Quit date: 10/26/2005  . Smokeless tobacco: Never Used  . Alcohol Use: 0.0 oz/week    0 Standard drinks or equivalent per week     Comment: occasional  . Drug Use: No  . Sexual Activity: Not Currently   Other Topics Concern  . None   Social History Narrative   Lives with husband and two sons.    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   Completed to 11th grade.    Review of Systems: Constitutional: Negative for fever, chills.  Eyes: Negative for blurred vision.  Respiratory: Negative for cough and shortness of breath.  Cardiovascular: Negative for chest pain.  Gastrointestinal: Negative for nausea,  vomiting. Neurological: Negative for dizziness.   Objective:   Vital Signs: Filed Vitals:   03/10/16 1417 03/10/16 1426  BP: 172/88 152/93  Pulse: 94 93  Temp: 98 F (36.7 C)   TempSrc: Oral   Resp: 18   Height: 5\' 3"  (1.6 m)   Weight: 217 lb 4.8 oz (98.567 kg)   SpO2: 100%       BP Readings from Last 3 Encounters:  03/10/16 152/93  01/29/16 160/77  10/31/15 163/101    Physical Exam: Constitutional: Vital signs reviewed.  Patient is in NAD and cooperative with exam.  Head: Normocephalic and atraumatic. Eyes: EOMI, conjunctivae nl, no scleral icterus.  Neck: Supple. Cardiovascular: RRR, no MRG. Pulmonary/Chest: normal effort, CTAB, no wheezes, rales, or rhonchi. Abdominal: Soft. NT/ND +BS. Neurological: A&O x3, cranial nerves II-XII are grossly intact, moving all extremities. Extremities: No LE edema. Skin: Warm, dry and intact. No rash.   Assessment & Plan:   Assessment and plan was discussed and formulated with my attending.

## 2016-03-10 NOTE — Assessment & Plan Note (Signed)
She should be taking 1000 units vit D daily but was not aware.   -refilled vitamin D 1000 units today

## 2016-03-10 NOTE — Progress Notes (Signed)
Case discussed with Dr. Gill soon after the resident saw the patient.  We reviewed the resident's history and exam and pertinent patient test results.  I agree with the assessment, diagnosis, and plan of care documented in the resident's note. 

## 2016-03-10 NOTE — Assessment & Plan Note (Signed)
She is doing well on cymbalta.  PHQ2: 0.  However, she is taking 60mg  (2 tabs of the 30mg ) -d/c 30mg  cymbalta -add cymbalta 60mg  daily (refilled today)

## 2016-03-27 ENCOUNTER — Other Ambulatory Visit: Payer: Self-pay

## 2016-03-27 DIAGNOSIS — E1149 Type 2 diabetes mellitus with other diabetic neurological complication: Secondary | ICD-10-CM

## 2016-03-27 MED ORDER — INSULIN GLARGINE 100 UNIT/ML ~~LOC~~ SOLN: SUBCUTANEOUS | Status: DC

## 2016-03-27 NOTE — Telephone Encounter (Signed)
Pt requesting Lantus to be filled @ [facility] on [address]

## 2016-04-07 ENCOUNTER — Other Ambulatory Visit: Payer: Self-pay

## 2016-04-07 MED ORDER — TRAMADOL HCL 50 MG PO TABS: 50.0000 mg | ORAL_TABLET | Freq: Four times a day (QID) | ORAL | Status: DC | PRN

## 2016-04-07 MED ORDER — "INSULIN SYRINGE 31G X 5/16"" 0.5 ML MISC": 1.0000 | Freq: Every day | Status: DC

## 2016-04-07 NOTE — Telephone Encounter (Signed)
Pt requesting tramadol and insulin syringe-needle to be filled @ [facility] on [address]

## 2016-04-10 ENCOUNTER — Other Ambulatory Visit: Payer: Self-pay

## 2016-04-10 NOTE — Telephone Encounter (Signed)
Tramadol has been called to [facility]

## 2016-04-10 NOTE — Telephone Encounter (Signed)
Pt states the tramadol is not at the pharmacy. Please call pt back.

## 2016-05-07 ENCOUNTER — Encounter: Payer: Self-pay | Admitting: Internal Medicine

## 2016-05-25 ENCOUNTER — Other Ambulatory Visit: Payer: Self-pay

## 2016-05-25 DIAGNOSIS — E1142 Type 2 diabetes mellitus with diabetic polyneuropathy: Secondary | ICD-10-CM

## 2016-05-25 MED ORDER — METFORMIN HCL 500 MG PO TABS: ORAL_TABLET | ORAL | 5 refills | Status: DC

## 2016-05-25 NOTE — Telephone Encounter (Signed)
Requesting metformin to be filled @ [facility] on [address]

## 2016-05-26 ENCOUNTER — Other Ambulatory Visit: Payer: Self-pay | Admitting: Internal Medicine

## 2016-05-26 DIAGNOSIS — E1149 Type 2 diabetes mellitus with other diabetic neurological complication: Secondary | ICD-10-CM

## 2016-05-26 NOTE — Telephone Encounter (Signed)
Last appt 5/16.  Next appt 05/28/16

## 2016-05-28 ENCOUNTER — Encounter: Payer: Self-pay | Admitting: Internal Medicine

## 2016-05-28 ENCOUNTER — Ambulatory Visit (INDEPENDENT_AMBULATORY_CARE_PROVIDER_SITE_OTHER): Payer: Medicare Other | Admitting: Internal Medicine

## 2016-05-28 VITALS — BP 151/78 | HR 85 | Temp 97.9°F | Ht 62.0 in | Wt 217.1 lb

## 2016-05-28 DIAGNOSIS — I10 Essential (primary) hypertension: Secondary | ICD-10-CM | POA: Diagnosis not present

## 2016-05-28 DIAGNOSIS — Z87891 Personal history of nicotine dependence: Secondary | ICD-10-CM

## 2016-05-28 DIAGNOSIS — I701 Atherosclerosis of renal artery: Secondary | ICD-10-CM

## 2016-05-28 DIAGNOSIS — Z79899 Other long term (current) drug therapy: Secondary | ICD-10-CM

## 2016-05-28 DIAGNOSIS — Z794 Long term (current) use of insulin: Secondary | ICD-10-CM

## 2016-05-28 DIAGNOSIS — E1142 Type 2 diabetes mellitus with diabetic polyneuropathy: Secondary | ICD-10-CM | POA: Diagnosis not present

## 2016-05-28 LAB — POCT GLYCOSYLATED HEMOGLOBIN (HGB A1C): HEMOGLOBIN A1C: 8.1

## 2016-05-28 LAB — GLUCOSE, CAPILLARY: GLUCOSE-CAPILLARY: 139 mg/dL — AB (ref 65–99)

## 2016-05-28 MED ORDER — METFORMIN HCL 1000 MG PO TABS: 1000.0000 mg | ORAL_TABLET | Freq: Two times a day (BID) | ORAL | 5 refills | Status: DC

## 2016-05-28 NOTE — Patient Instructions (Addendum)
Thank you for coming to see me today. It was a pleasure. Today we talked about:   BP: Please keep a log of your Blood pressure and bring in 2 weeks for a follow up appointment.  Watch your salt intake.  I have attached some information on low salt diets.  Diabetes: continue your insulin at current dose.  I have increased your metformin to 1000mg  twice per day.  I have also referred you to [facility] for nutrition management.  Please follow-up with [place] in 2 weeks.  If you have any questions or concerns, please do not hesitate to call the office at [phone].  Take Care,   Jule Ser, DO DASH Eating Plan DASH stands for "Dietary Approaches to Stop Hypertension." The DASH eating plan is a healthy eating plan that has been shown to reduce high blood pressure (hypertension). Additional health benefits may include reducing the risk of type 2 diabetes mellitus, heart disease, and stroke. The DASH eating plan may also help with weight loss. WHAT DO I NEED TO KNOW ABOUT THE DASH EATING PLAN? For the DASH eating plan, you will follow these general guidelines:  Choose foods with a percent daily value for sodium of less than 5% (as listed on the food label).  Use salt-free seasonings or herbs instead of table salt or sea salt.  Check with your health care provider or pharmacist before using salt substitutes.  Eat lower-sodium products, often labeled as "lower sodium" or "no salt added."  Eat fresh foods.  Eat more vegetables, fruits, and low-fat dairy products.  Choose whole grains. Look for the word "whole" as the first word in the ingredient list.  Choose fish and skinless chicken or [place] more often than red meat. Limit fish, poultry, and meat to 6 oz (170 g) each day.  Limit sweets, desserts, sugars, and sugary drinks.  Choose heart-healthy fats.  Limit cheese to 1 oz (28 g) per day.  Eat more home-cooked food and less restaurant, buffet, and fast food.  Limit fried  foods.  Cook foods using methods other than frying.  Limit canned vegetables. If you do use them, rinse them well to decrease the sodium.  When eating at a restaurant, ask that your food be prepared with less salt, or no salt if possible. WHAT FOODS CAN I EAT? Seek help from a dietitian for individual calorie needs. Grains Whole grain or whole wheat bread. Brown rice. Whole grain or whole wheat pasta. Quinoa, bulgur, and whole grain cereals. Low-sodium cereals. Corn or whole wheat flour tortillas. Whole grain cornbread. Whole grain crackers. Low-sodium crackers. Vegetables Fresh or frozen vegetables (raw, steamed, roasted, or grilled). Low-sodium or reduced-sodium tomato and vegetable juices. Low-sodium or reduced-sodium tomato sauce and paste. Low-sodium or reduced-sodium canned vegetables.  Fruits All fresh, canned (in natural juice), or frozen fruits. Meat and Other Protein Products Ground beef (85% or leaner), grass-fed beef, or beef trimmed of fat. Skinless chicken or [place]. Ground chicken or [place]. Pork trimmed of fat. All fish and seafood. Eggs. Dried beans, peas, or lentils. Unsalted nuts and seeds. Unsalted canned beans. Dairy Low-fat dairy products, such as skim or 1% milk, 2% or reduced-fat cheeses, low-fat ricotta or cottage cheese, or plain low-fat yogurt. Low-sodium or reduced-sodium cheeses. Fats and Oils Tub margarines without trans fats. Light or reduced-fat mayonnaise and salad dressings (reduced sodium). Avocado. Safflower, olive, or canola oils. Natural peanut or almond butter. Other Unsalted popcorn and pretzels. The items listed above may not be a complete  list of recommended foods or beverages. Contact your dietitian for more options. WHAT FOODS ARE NOT RECOMMENDED? Grains White bread. White pasta. White rice. Refined cornbread. Bagels and croissants. Crackers that contain trans fat. Vegetables Creamed or fried vegetables. Vegetables in a cheese sauce. Regular  canned vegetables. Regular canned tomato sauce and paste. Regular tomato and vegetable juices. Fruits Dried fruits. Canned fruit in light or heavy syrup. Fruit juice. Meat and Other Protein Products Fatty cuts of meat. Ribs, chicken wings, bacon, sausage, bologna, salami, chitterlings, fatback, hot dogs, bratwurst, and packaged luncheon meats. Salted nuts and seeds. Canned beans with salt. Dairy Whole or 2% milk, cream, half-and-half, and cream cheese. Whole-fat or sweetened yogurt. Full-fat cheeses or blue cheese. Nondairy creamers and whipped toppings. Processed cheese, cheese spreads, or cheese curds. Condiments Onion and garlic salt, seasoned salt, table salt, and sea salt. Canned and packaged gravies. Worcestershire sauce. Tartar sauce. Barbecue sauce. Teriyaki sauce. Soy sauce, including reduced sodium. Steak sauce. Fish sauce. Oyster sauce. Cocktail sauce. Horseradish. Ketchup and mustard. Meat flavorings and tenderizers. Bouillon cubes. Hot sauce. Tabasco sauce. Marinades. Taco seasonings. Relishes. Fats and Oils Butter, stick margarine, lard, shortening, ghee, and bacon fat. Coconut, palm kernel, or palm oils. Regular salad dressings. Other Pickles and olives. Salted popcorn and pretzels. The items listed above may not be a complete list of foods and beverages to avoid. Contact your dietitian for more information. WHERE CAN I FIND MORE INFORMATION? National Heart, Lung, and Blood Institute: travelstabloid.com   This information is not intended to replace advice given to you by your health care provider. Make sure you discuss any questions you have with your health care provider.   Document Released: 10/01/2011 Document Revised: 11/02/2014 Document Reviewed: 08/16/2013 Elsevier Interactive Patient Education [employer].

## 2016-05-29 LAB — BMP8+ANION GAP
Anion Gap: 16 mmol/L (ref 10.0–18.0)
BUN / CREAT RATIO: 17 (ref 12–28)
BUN: 21 mg/dL (ref 8–27)
CO2: 24 mmol/L (ref 18–29)
CREATININE: 1.25 mg/dL — AB (ref 0.57–1.00)
Calcium: 10.5 mg/dL — ABNORMAL HIGH (ref 8.7–10.3)
Chloride: 99 mmol/L (ref 96–106)
GFR calc Af Amer: 54 mL/min/{1.73_m2} — ABNORMAL LOW (ref >59)
GFR calc non Af Amer: 47 mL/min/{1.73_m2} — ABNORMAL LOW (ref >59)
Glucose: 143 mg/dL — ABNORMAL HIGH (ref 65–99)
Potassium: 4.6 mmol/L (ref 3.5–5.2)
Sodium: 139 mmol/L (ref 134–144)

## 2016-05-30 NOTE — Assessment & Plan Note (Addendum)
BP Readings from Last 3 Encounters:  05/28/16 (!) 151/78  03/10/16 (!) 152/93  01/29/16 (!) 160/77   A: Her BP is elevated today at 151/78.  She states she took her meds this morning.  Took her BP as well and was 138/90.  She brought a BP log last visit and most readings were less than 140/90.   She is currently on combination olmesartan-amlodipine-hctz 40-10-25mg  and carvedilol 25mg  BID.  Body mass index is 39.71 kg/m.  P: - continue current management.  Given another BP log and asked to follow up in 1 month.  If BP consistently elevated, can consider adding back Terazosin or other options such as hydralazine. - BMET checked today showed slight worsening of her creatinine from 1 year ago.  Her calcium also minimally elevated.  Will repeat at follow up in 1 month. - recommended diet and exercise as well as some weight loss to better control her BP.

## 2016-05-30 NOTE — Assessment & Plan Note (Signed)
A: Given her elevated BP's have asked patient to follow up with cardiology as has not seen in a while to check on stent.  P: - follow up with cardiology.

## 2016-05-30 NOTE — Assessment & Plan Note (Signed)
Lab Results  Component Value Date   HGBA1C 8.1 05/28/2016   A: A1c has deteriorated from April when it was 7.5.  She is on Lantus 44units QHS and metformin 500mg  QAM and 1000mg  QPM.  She is due for eye exam but states that she had this done a couple months ago.  Records not currently available to me, but in the past has had mild, non-proliferative retinopathy.  She reports slipping up[ frequently and not adhering to a DM diet.  P: - obtain records from eye doctor - continue Lantus at current dose - increase metformin to 1000mg  BID - encouraged better adherence to DM diet.

## 2016-05-30 NOTE — Progress Notes (Signed)
CC: DM and BP follow up  HPI:  [name] is a 60 y.o. woman with a past medical history listed below here today for follow up of her DM and HTN.  For details of today's visit and the status of her chronic medical issues please refer to the assessment and plan.   Past Medical History:  Diagnosis Date  . Colon polyps   . Colon polyps   . Diabetes mellitus   . Diverticulosis   . Hyperlipidemia   . Hypertension   . 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 Albertine Patricia  . Sleep apnea 2007    status post polysomnogram 2007 , suggested use of CPAP  . Ventricular hypertrophy 2006    a 2-D echo in 2006, ejection fraction 55%, mild tricuspid regurgitation noted as well, 2-D echo January 19, [id number] showed diastolic dysfunction with LVEF normal of 65%    Review of Systems:   Review of Systems  Constitutional: Negative for chills and fever.  Respiratory: Negative for shortness of breath.   Cardiovascular: Negative for chest pain.     Physical Exam:  Vitals:   05/28/16 1539  BP: (!) 151/78  Pulse: 85  Temp: 97.9 F (36.6 C)  TempSrc: Oral  SpO2: 98%  Weight: 217 lb 1.6 oz (98.5 kg)  Height: 5\' 2"  (1.575 m)   Physical Exam  Constitutional: She is oriented to person, place, and time and well-developed, well-nourished, and in no distress.  Cardiovascular: Normal rate and regular rhythm.   Pulmonary/Chest: Effort normal.  Neurological: She is alert and oriented to person, place, and time.  Psychiatric: Mood and affect normal.    Assessment & Plan:   See Encounters Tab for problem based charting.  Patient discussed with Dr. Eppie Gibson.  Essential hypertension BP Readings from Last 3 Encounters:  05/28/16 (!) 151/78  03/10/16 (!) 152/93  01/29/16 (!) 160/77    A: Her BP is elevated today at 151/78.  She states she took her meds this morning.  Took her BP as well and was 138/90.  She brought a BP log last visit and most readings were less than 140/90.   She is currently on combination olmesartan-amlodipine-hctz 40-10-25mg  and carvedilol 25mg  BID.  Body mass index is 39.71 kg/m.  P: - continue current management.  Given another BP log and asked to follow up in 1 month.  If BP consistently elevated, can consider adding back Terazosin or other options such as hydralazine. - BMET checked today showed slight worsening of her creatinine from 1 year ago.  Her calcium also minimally elevated.  Will repeat at follow up in 1 month. - recommended diet and exercise as well as some weight loss to better control her BP.  RENAL ARTERY STENOSIS A: Given her elevated BP's have asked patient to follow up with cardiology as has not seen in a while to check on stent.  P: - follow up with cardiology.  DM type 2 with diabetic peripheral neuropathy Lab Results  Component Value Date   HGBA1C 8.1 05/28/2016   A: A1c has deteriorated from April when it was 7.5.  She is on Lantus 44units QHS and metformin 500mg  QAM and 1000mg  QPM.  She is due for eye exam but states that she had this done a couple months ago.  Records not currently available to me, but in the past has had mild, non-proliferative retinopathy.  She reports slipping up[ frequently and not adhering to a DM diet.  P: - obtain records from eye doctor - continue Lantus at current dose - increase metformin to 1000mg  BID - encouraged better adherence to DM diet.

## 2016-06-08 NOTE — Progress Notes (Signed)
Case discussed with Dr. Wallace at the time of the visit.  We reviewed the resident's history and exam and pertinent patient test results.  I agree with the assessment, diagnosis and plan of care documented in the resident's note. 

## 2016-06-22 ENCOUNTER — Other Ambulatory Visit: Payer: Self-pay | Admitting: Internal Medicine

## 2016-06-22 NOTE — Telephone Encounter (Signed)
Last refill written 6/13 w/1 refill. Last ov 8/3; next ov 8/31.

## 2016-06-22 NOTE — Telephone Encounter (Signed)
Requesting refill on tramadol.  [facility] on [address]. Pt is aware of 48-72 hours turn around time.

## 2016-06-23 MED ORDER — TRAMADOL HCL 50 MG PO TABS: 50.0000 mg | ORAL_TABLET | Freq: Four times a day (QID) | ORAL | 1 refills | Status: DC | PRN

## 2016-06-23 NOTE — Telephone Encounter (Signed)
Tramadol rx called to [facility]. 

## 2016-06-25 ENCOUNTER — Encounter: Payer: Self-pay | Admitting: Internal Medicine

## 2016-06-25 ENCOUNTER — Ambulatory Visit (INDEPENDENT_AMBULATORY_CARE_PROVIDER_SITE_OTHER): Payer: Medicare Other | Admitting: Internal Medicine

## 2016-06-25 VITALS — BP 161/99 | HR 98 | Temp 98.0°F | Ht 62.0 in | Wt 216.9 lb

## 2016-06-25 DIAGNOSIS — I701 Atherosclerosis of renal artery: Secondary | ICD-10-CM | POA: Diagnosis not present

## 2016-06-25 DIAGNOSIS — I10 Essential (primary) hypertension: Secondary | ICD-10-CM | POA: Diagnosis not present

## 2016-06-25 DIAGNOSIS — Z79899 Other long term (current) drug therapy: Secondary | ICD-10-CM | POA: Diagnosis not present

## 2016-06-25 LAB — GLUCOSE, CAPILLARY: Glucose-Capillary: 173 mg/dL — ABNORMAL HIGH (ref 65–99)

## 2016-06-25 NOTE — Patient Instructions (Signed)
Thank you for coming to see me today. It was a pleasure. Today we talked about:   Blood pressure: - continue your current medications as is. - keep checking your blood pressure at home.  Bring your cuff to your next appointment. - please call your cardiologist to schedule an appointment.  I will let you know if anything is abnormal on your lab work.  Please follow-up with me in 2 months.  If you have any questions or concerns, please do not hesitate to call the office at [phone].  Take Care,   Jule Ser, DO

## 2016-06-25 NOTE — Progress Notes (Signed)
   CC: here for HTN follow up and hypercalcemia  HPI:  Kristy [name] is a 60 y.o. woman with a past medical history listed below here today for follow up of her HTN and elevated serum calcium.  For details of today's visit and the status of her chronic medical issues please refer to the assessment and plan.   Past Medical History:  Diagnosis Date  . Colon polyps   . Colon polyps   . Diabetes mellitus   . Diverticulosis   . Hyperlipidemia   . Hypertension   . 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 Albertine Patricia  . Sleep apnea 2007    status post polysomnogram 2007 , suggested use of CPAP  . Ventricular hypertrophy 2006    a 2-D echo in 2006, ejection fraction 55%, mild tricuspid regurgitation noted as well, 2-D echo January 19, [id number] showed diastolic dysfunction with LVEF normal of 65%    Review of Systems:   Review of Systems  Constitutional: Negative.   Respiratory: Negative for cough and shortness of breath.   Cardiovascular: Negative for chest pain.     Physical Exam:  Vitals:   06/25/16 1559  BP: (!) 161/99  Pulse: 98  Temp: 98 F (36.7 C)  TempSrc: Oral  SpO2: 99%  Weight: 216 lb 14.4 oz (98.4 kg)  Height: 5\' 2"  (1.575 m)   Physical Exam  Constitutional: She is oriented to person, place, and time and well-developed, well-nourished, and in no distress.  HENT:  Head: Normocephalic and atraumatic.  Eyes: EOM are normal.  Cardiovascular: Normal rate and regular rhythm.   Pulmonary/Chest: Effort normal.  Neurological: She is alert and oriented to person, place, and time.  Skin: Skin is warm and dry.  Psychiatric: Mood and affect normal.    Assessment & Plan:   See Encounters Tab for problem based charting.  Patient discussed with Dr. Eppie Gibson    Essential hypertension BP Readings from Last 3 Encounters:  06/25/16 (!) 161/99  05/28/16 (!) 151/78  03/10/16 (!) 152/93   A: Her in office BP reading his elevated again today.  However, she brought with her a BP log that shows much better BP control at home with most readings in the 123XX123 systolic. She is currently on combination olmesartan-amlodipine-hctz 40-10-25mg  and carvedilol 25mg  BID.  Body mass index is 39.67 kg/m.  P: - continue current management with no changes to medications given BP control at home.  I have asked her to continue checking BP at home and keeping a log as well as to bring her cuff to next appointment to ensure accuracy. - BMET today showed improvement in her creatinine from a few weeks ago. - recommended diet and exercise as well as weight loss.  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  Has not yet made appointment to follow up with cardiology.  P: - recommended she schedule follow up with her cardiologist (Dr. Burt Knack)  Hypercalcemia A: Calcium was minimally elevated last visit at 10.5.  She is on a couple of offending medications that could be the source (HCTZ and cholecalciferol).   P: - repeat BMET today shows normalization of her calcium.

## 2016-06-26 LAB — BMP8+ANION GAP
Anion Gap: 19 mmol/L — ABNORMAL HIGH (ref 10.0–18.0)
BUN / CREAT RATIO: 15 (ref 12–28)
BUN: 17 mg/dL (ref 8–27)
CO2: 23 mmol/L (ref 18–29)
CREATININE: 1.13 mg/dL — AB (ref 0.57–1.00)
Calcium: 10 mg/dL (ref 8.7–10.3)
Chloride: 99 mmol/L (ref 96–106)
GFR calc Af Amer: 61 mL/min/{1.73_m2} (ref >59)
GFR, EST NON AFRICAN AMERICAN: 53 mL/min/{1.73_m2} — AB (ref >59)
Glucose: 178 mg/dL — ABNORMAL HIGH (ref 65–99)
Potassium: 4.2 mmol/L (ref 3.5–5.2)
Sodium: 141 mmol/L (ref 134–144)

## 2016-06-26 NOTE — Assessment & Plan Note (Signed)
BP Readings from Last 3 Encounters:  06/25/16 (!) 161/99  05/28/16 (!) 151/78  03/10/16 (!) 152/93   A: Her in office BP reading his elevated again today.  However, she brought with her a BP log that shows much better BP control at home with most readings in the 123XX123 systolic. She is currently on combination olmesartan-amlodipine-hctz 40-10-25mg  and carvedilol 25mg  BID.  Body mass index is 39.67 kg/m.  P: - continue current management with no changes to medications given BP control at home.  I have asked her to continue checking BP at home and keeping a log as well as to bring her cuff to next appointment to ensure accuracy. - BMET today showed improvement in her creatinine from a few weeks ago. - recommended diet and exercise as well as weight loss.

## 2016-06-26 NOTE — Assessment & Plan Note (Signed)
A:  Secondary to fibromuscular dysplasia s/p balloon angioplasty on left renal artery.  Has not yet made appointment to follow up with cardiology.  P: - recommended she schedule follow up with her cardiologist (Dr. Burt Knack)

## 2016-06-26 NOTE — Assessment & Plan Note (Signed)
A: Calcium was minimally elevated last visit at 10.5.  She is on a couple of offending medications that could be the source (HCTZ and cholecalciferol).   P: - repeat BMET today shows normalization of her calcium.

## 2016-06-26 NOTE — Progress Notes (Signed)
Case discussed with Dr. Wallace soon after the resident saw the patient.  We reviewed the resident's history and exam and pertinent patient test results.  I agree with the assessment, diagnosis and plan of care documented in the resident's note. 

## 2016-06-30 ENCOUNTER — Ambulatory Visit: Payer: Self-pay | Admitting: Dietician

## 2016-07-13 ENCOUNTER — Other Ambulatory Visit: Payer: Self-pay | Admitting: *Deleted

## 2016-07-13 MED ORDER — OLMESARTAN-AMLODIPINE-HCTZ 40-10-25 MG PO TABS: 1.0000 | ORAL_TABLET | Freq: Every day | ORAL | 0 refills | Status: DC

## 2016-07-30 NOTE — Addendum Note (Signed)
Addended by: Hulan Fray on: 07/30/2016 08:05 PM   Modules accepted: Orders

## 2016-08-25 ENCOUNTER — Other Ambulatory Visit: Payer: Self-pay | Admitting: Internal Medicine

## 2016-08-25 NOTE — Telephone Encounter (Signed)
Pain med refill °

## 2016-08-26 MED ORDER — TRAMADOL HCL 50 MG PO TABS: 50.0000 mg | ORAL_TABLET | Freq: Four times a day (QID) | ORAL | 1 refills | Status: DC | PRN

## 2016-08-27 NOTE — Telephone Encounter (Signed)
Tramadol rx called to [facility]. 

## 2016-09-25 ENCOUNTER — Other Ambulatory Visit: Payer: Self-pay | Admitting: Pharmacist

## 2016-09-25 NOTE — Patient Outreach (Signed)
Outreach call to [employer] regarding her request for follow up from the [facility] Medication Adherence Campaign. Called and spoke with patient. HIPAA identifiers verified and verbal consent received.   Ms. Ponciano reports that she takes her pravastatin daily, as directed. However, reports that she believes that she misses a dose of her medications maybe once or twice a week. Discuss with patient a variety of strategies for helping her to remember to take her medications such as using a pillbox, timing of taking the medications and placing her medications where she will see them to help her to remember. Discuss with patient the importance of medication adherence. Patient verbalizes understanding. Ms. Klutts states that she will try placing her medication bottle on the table where she eats her evening meal, as she has seen her son do this and believes that it may be beneficial to her as well. Patient reports that she has no small children in the home. Patient denies any issues with medication side effects or cost.    Provide patient with my phone number for future medication questions or concerns. Provide patient with my phone number.  Harlow Asa, PharmD Clinical Pharmacist [facility] [phone]

## 2016-10-12 ENCOUNTER — Encounter: Payer: Self-pay | Admitting: Internal Medicine

## 2016-11-05 ENCOUNTER — Ambulatory Visit (INDEPENDENT_AMBULATORY_CARE_PROVIDER_SITE_OTHER): Payer: Medicare Other | Admitting: Internal Medicine

## 2016-11-05 VITALS — BP 155/95 | HR 95 | Temp 97.9°F | Ht 62.0 in | Wt 217.3 lb

## 2016-11-05 DIAGNOSIS — F321 Major depressive disorder, single episode, moderate: Secondary | ICD-10-CM

## 2016-11-05 DIAGNOSIS — I10 Essential (primary) hypertension: Secondary | ICD-10-CM

## 2016-11-05 DIAGNOSIS — E1142 Type 2 diabetes mellitus with diabetic polyneuropathy: Secondary | ICD-10-CM | POA: Diagnosis not present

## 2016-11-05 DIAGNOSIS — Z79899 Other long term (current) drug therapy: Secondary | ICD-10-CM

## 2016-11-05 DIAGNOSIS — Z794 Long term (current) use of insulin: Secondary | ICD-10-CM

## 2016-11-05 DIAGNOSIS — Z23 Encounter for immunization: Secondary | ICD-10-CM

## 2016-11-05 DIAGNOSIS — Z6839 Body mass index (BMI) 39.0-39.9, adult: Secondary | ICD-10-CM | POA: Diagnosis not present

## 2016-11-05 DIAGNOSIS — E1149 Type 2 diabetes mellitus with other diabetic neurological complication: Secondary | ICD-10-CM

## 2016-11-05 DIAGNOSIS — Z87891 Personal history of nicotine dependence: Secondary | ICD-10-CM

## 2016-11-05 DIAGNOSIS — Z0000 Encounter for general adult medical examination without abnormal findings: Secondary | ICD-10-CM

## 2016-11-05 LAB — POCT GLYCOSYLATED HEMOGLOBIN (HGB A1C): HEMOGLOBIN A1C: 10.3

## 2016-11-05 LAB — GLUCOSE, CAPILLARY: Glucose-Capillary: 276 mg/dL — ABNORMAL HIGH (ref 65–99)

## 2016-11-05 MED ORDER — BUPROPION HCL ER (XL) 150 MG PO TB24: ORAL_TABLET | ORAL | 0 refills | Status: DC

## 2016-11-05 MED ORDER — TRAMADOL HCL 50 MG PO TABS: 50.0000 mg | ORAL_TABLET | Freq: Four times a day (QID) | ORAL | 1 refills | Status: DC | PRN

## 2016-11-05 MED ORDER — INSULIN GLARGINE 100 UNIT/ML ~~LOC~~ SOLN
SUBCUTANEOUS | 3 refills | Status: DC
Start: 2016-11-05 — End: 2016-12-25

## 2016-11-05 NOTE — Patient Instructions (Addendum)
Thank you for coming to see me today. It was a pleasure. Today we talked about:   Depression: - Please taper your Cymbalta over the next two weeks:  - Week 1: take every other day  - Week 2: take every 3 days then STOP - Start taking Wellbutrin by taking 150mg  for 1 week, then increase to 300mg  daily afterwards.  Take in the morning. - I have referred you to a counselor.  Diabetes: - we have increased your Lantus to 50 units in the evening. - please check your blood sugars more frequently the next 2-3 weeks and bring meter to follow up appointment  Please follow-up with [place] in 3 weeks.  If you have any questions or concerns, please do not hesitate to call the office at [phone].  Take Care,   Jule Ser, DO

## 2016-11-05 NOTE — Progress Notes (Signed)
CC: here for depression and DM follow up  HPI:  [name] is a 61 y.o. woman with a past medical history listed below here today for follow up of her DM and depression.  For details of today's visit and the status of her chronic medical issues please refer to the assessment and plan.   Past Medical History:  Diagnosis Date  . Colon polyps   . Colon polyps   . Diabetes mellitus   . Diverticulosis   . Hyperlipidemia   . Hypertension   . 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 Albertine Patricia  . Sleep apnea 2007    status post polysomnogram 2007 , suggested use of CPAP  . Ventricular hypertrophy 2006    a 2-D echo in 2006, ejection fraction 55%, mild tricuspid regurgitation noted as well, 2-D echo November 13, 9602 showed diastolic dysfunction with LVEF normal of 65%    Review of Systems:  Please see pertinent ROS reviewed in HPI and problem based charting.   Physical Exam:  Vitals:   11/05/16 1535  BP: (!) 155/95  Pulse: 95  Temp: 97.9 F (36.6 C)  TempSrc: Oral  SpO2: 100%  Weight: 217 lb 4.8 oz (98.6 kg)  Height: 5\' 2"  (1.575 m)   Physical Exam  Constitutional: She is oriented to person, place, and time and well-developed, well-nourished, and in no distress.  HENT:  Head: Normocephalic and atraumatic.  Eyes: Conjunctivae and EOM are normal.  Pulmonary/Chest: Effort normal.  Neurological: She is alert and oriented to person, place, and time.  Skin: Skin is warm and dry.  Psychiatric: Mood and affect normal.     Assessment & Plan:   See Encounters Tab for problem based charting.  Patient discussed with Dr. Evette Doffing.  Essential hypertension BP Readings from Last 3 Encounters:  11/05/16 (!) 155/95  06/25/16 (!) 161/99  05/28/16 (!) 151/78   A:  BP  is above goal today.  Reports checking at home couple times per week and well-controlled.  Previously, she has brought with her a BP log that shows much better BP control at home with most readings in the 540'J systolic. She is currently on combination olmesartan-amlodipine-hctz 40-10-25mg  and carvedilol 25mg  BID.  She did not bring log or cuff today.  Denies headaches, chest pain, lightheadedness, blurry vision. Body mass index is 39.74 kg/m.   P: - did not make any changes today given multiple other medication changes - f/u in 2 weeks with BP cuff and log - recommended diet and exercise as well as weight loss.  DM type 2 with diabetic peripheral neuropathy Lab Results  Component Value Date   HGBA1C 10.3 11/05/2016    A:  A1c deteriorated furhter today.  Reports adherence to metformin and lantus. Reports trying to eat right but has lots of indiscretion centered around her depression.  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  Denies polyuria or polydipsia.  P: - increase Lantus to 50mg  QHS.  Asked her to check sugars more frequently and f/u in 2-3 weeks to titrate - foot exam done today - she states went to Dr. Nicki Reaper on [address] for eye exam  - encouraged better adherence to DM diet - consider adding GLP-1 or SGLT-2 in future  Moderate major depression ([place]) Depression  screen [facility] 2/9 11/05/2016 06/25/2016 05/28/2016  Decreased Interest 3 0 0  Down, Depressed, Hopeless 3 0 0  PHQ - 2 Score 6 0 0  Altered sleeping 3 - -  Tired, decreased energy 3 - -  Change in appetite 0 - -  Feeling bad or failure about yourself  1 - -  Trouble concentrating 0 - -  Moving slowly or fidgety/restless 1 - -  Suicidal thoughts 0 - -  PHQ-9 Score 14 - -  Difficult doing work/chores Extremely dIfficult - -   A: PHQ-9 today is 14.  Has been well-controlled over many months.  On Cymbalta 60mg  daily and reports does not think it is working anymore.  She says she lacks motivation to do  anything, barely leaves the house, is stressed with her husband, and feels tired.  TSH normal in 2015.  Denies feeling unsafe.  Denies SI/HI.  P: - d/c cymbalta for now.  Discussed 2 week taper - start wellbutrin 150mg  daily, then increase to 300mg  daily - if symptoms uncontrolled on this, would consider reinstating SSRI/SNRI to her depression regimen - referral to psychology for counseling

## 2016-11-07 NOTE — Assessment & Plan Note (Signed)
Lab Results  Component Value Date   HGBA1C 10.3 11/05/2016    A:  A1c deteriorated furhter today.  Reports adherence to metformin and lantus. Reports trying to eat right but has lots of indiscretion centered around her depression.  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  Denies polyuria or polydipsia.  P: - increase Lantus to 50mg  QHS.  Asked her to check sugars more frequently and f/u in 2-3 weeks to titrate - foot exam done today - she states went to Dr. Nicki Reaper on [address] for eye exam  - encouraged better adherence to DM diet - consider adding GLP-1 or SGLT-2 in future

## 2016-11-07 NOTE — Assessment & Plan Note (Signed)
BP Readings from Last 3 Encounters:  11/05/16 (!) 155/95  06/25/16 (!) 161/99  05/28/16 (!) 151/78   A:  BP is above goal today.  Reports checking at home couple times per week and well-controlled.  Previously, she has brought with her a BP log that shows much better BP control at home with most readings in the 037'V systolic. She is currently on combination olmesartan-amlodipine-hctz 40-10-25mg  and carvedilol 25mg  BID.  She did not bring log or cuff today.  Denies headaches, chest pain, lightheadedness, blurry vision. Body mass index is 39.74 kg/m.   P: - did not make any changes today given multiple other medication changes - f/u in 2 weeks with BP cuff and log - recommended diet and exercise as well as weight loss.

## 2016-11-07 NOTE — Assessment & Plan Note (Signed)
Depression screen [facility] 2/9 11/05/2016 06/25/2016 05/28/2016  Decreased Interest 3 0 0  Down, Depressed, Hopeless 3 0 0  PHQ - 2 Score 6 0 0  Altered sleeping 3 - -  Tired, decreased energy 3 - -  Change in appetite 0 - -  Feeling bad or failure about yourself  1 - -  Trouble concentrating 0 - -  Moving slowly or fidgety/restless 1 - -  Suicidal thoughts 0 - -  PHQ-9 Score 14 - -  Difficult doing work/chores Extremely dIfficult - -   A: PHQ-9 today is 14.  Has been well-controlled over many months.  On Cymbalta 60mg  daily and reports does not think it is working anymore.  She says she lacks motivation to do anything, barely leaves the house, is stressed with her husband, and feels tired.  TSH normal in 2015.  Denies feeling unsafe.  Denies SI/HI.  P: - d/c cymbalta for now.  Discussed 2 week taper - start wellbutrin 150mg  daily, then increase to 300mg  daily - if symptoms uncontrolled on this, would consider reinstating SSRI/SNRI to her depression regimen - referral to psychology for counseling

## 2016-11-10 ENCOUNTER — Telehealth: Payer: Self-pay

## 2016-11-10 NOTE — Progress Notes (Signed)
Internal Medicine Clinic Attending  Case discussed with Dr. Wallace at the time of the visit.  We reviewed the resident's history and exam and pertinent patient test results.  I agree with the assessment, diagnosis, and plan of care documented in the resident's note.  

## 2016-11-10 NOTE — Telephone Encounter (Signed)
Called patient regarding psych referral preference. Patient reports that behavioral health referral is fine with her. Will make referral to [facility]

## 2016-12-03 ENCOUNTER — Encounter: Payer: Self-pay | Admitting: Internal Medicine

## 2016-12-03 ENCOUNTER — Ambulatory Visit (INDEPENDENT_AMBULATORY_CARE_PROVIDER_SITE_OTHER): Payer: Medicare Other | Admitting: Internal Medicine

## 2016-12-03 ENCOUNTER — Ambulatory Visit: Payer: Medicare Other | Admitting: Pharmacist

## 2016-12-03 ENCOUNTER — Ambulatory Visit: Payer: Self-pay | Admitting: Pharmacist

## 2016-12-03 DIAGNOSIS — I10 Essential (primary) hypertension: Secondary | ICD-10-CM | POA: Diagnosis not present

## 2016-12-03 DIAGNOSIS — Z87891 Personal history of nicotine dependence: Secondary | ICD-10-CM

## 2016-12-03 DIAGNOSIS — Z794 Long term (current) use of insulin: Secondary | ICD-10-CM

## 2016-12-03 DIAGNOSIS — Z79899 Other long term (current) drug therapy: Secondary | ICD-10-CM

## 2016-12-03 DIAGNOSIS — F331 Major depressive disorder, recurrent, moderate: Secondary | ICD-10-CM

## 2016-12-03 DIAGNOSIS — Z9114 Patient's other noncompliance with medication regimen: Secondary | ICD-10-CM

## 2016-12-03 DIAGNOSIS — F321 Major depressive disorder, single episode, moderate: Secondary | ICD-10-CM

## 2016-12-03 DIAGNOSIS — E1142 Type 2 diabetes mellitus with diabetic polyneuropathy: Secondary | ICD-10-CM | POA: Diagnosis not present

## 2016-12-03 DIAGNOSIS — Z719 Counseling, unspecified: Secondary | ICD-10-CM

## 2016-12-03 LAB — GLUCOSE, CAPILLARY: Glucose-Capillary: 144 mg/dL — ABNORMAL HIGH (ref 65–99)

## 2016-12-03 MED ORDER — METFORMIN HCL 1000 MG PO TABS: 1000.0000 mg | ORAL_TABLET | Freq: Two times a day (BID) | ORAL | 5 refills | Status: DC

## 2016-12-03 MED ORDER — PRAVASTATIN SODIUM 80 MG PO TABS: 80.0000 mg | ORAL_TABLET | Freq: Every day | ORAL | 3 refills | Status: DC

## 2016-12-03 MED ORDER — CARVEDILOL 25 MG PO TABS: 25.0000 mg | ORAL_TABLET | Freq: Two times a day (BID) | ORAL | 3 refills | Status: DC

## 2016-12-03 MED ORDER — OLMESARTAN-AMLODIPINE-HCTZ 40-10-25 MG PO TABS: 1.0000 | ORAL_TABLET | Freq: Every day | ORAL | 3 refills | Status: DC

## 2016-12-03 NOTE — Progress Notes (Signed)
CC: here for BP, DM, and Depression follow up.  HPI:  Kristy [name] is a 61 y.o. woman with a past medical history listed below here today for follow up of her DM, BP, and depression.  For details of today's visit and the status of her chronic medical issues please refer to the assessment and plan.   Past Medical History:  Diagnosis Date  . Colon polyps   . Colon polyps   . Diabetes mellitus   . Diverticulosis   . Hyperlipidemia   . Hypertension   . 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 Albertine Patricia  . Sleep apnea 2007    status post polysomnogram 2007 , suggested use of CPAP  . Ventricular hypertrophy 2006    a 2-D echo in 2006, ejection fraction 55%, mild tricuspid regurgitation noted as well, 2-D echo November 14, 1759 showed diastolic dysfunction with LVEF normal of 65%    Review of Systems:  Please see pertinent ROS reviewed in HPI and problem based charting.   Physical Exam:  Vitals:   12/03/16 1434 12/03/16 1545  BP: (!) 157/95 (!) 151/97  Pulse: (!) 109 (!) 102  Temp: 98 F (36.7 C)   TempSrc: Oral   SpO2: 95%   Weight: 217 lb 12.8 oz (98.8 kg)   Height: 5\' 2"  (1.575 m)    Physical Exam  Constitutional: She is oriented to person, place, and time and well-developed, well-nourished, and in no distress.  HENT:  Head: Normocephalic and atraumatic.  Cardiovascular:  Mild tachycardia. Regular rhythm.  Pulmonary/Chest: Effort normal and breath sounds normal.  Neurological: She is alert and oriented to person, place, and time.  Skin: Skin is warm and dry.  Psychiatric: Mood and affect normal.     Assessment & Plan:   See Encounters Tab for problem based charting.  Patient discussed with Dr. Beryle Beams.  Essential hypertension BP Readings from  Last 3 Encounters:  12/03/16 (!) 151/97  11/05/16 (!) 155/95  06/25/16 (!) 161/99   A: She is here for BP follow up from about 4 weeks ago.  She did not bring her BP log or cuff.  She has been more active since starting Wellbutrin.  Reports home readings in the SBP 120-130s.  None higher than 150 per her report.  Reports adherence.  However, pharmacy reports her Coreg was last filled 07/24/16 for 90 days.  She brings this pill bottle in with her that is dated from September and has many pills in it.  Pharmacy records indicate better adherence with her olmesartan-amlodipine-HCTZ combo pill.  Denies blurry vision, lightheadedness, or headaches.  P: - continue current regimen in light of non-adherence - f/u in 2 months with BP cuff and log.  Asked patient for 2 week f/u but she preferred 2 months when she will be due for her A1c.  Asked her to call clinic for sooner appointment if BP consistently greater than 607 systolic or 90 diastolic.  DM type 2 with diabetic peripheral neuropathy A: She is here for DM f/u from about 4 weeks ago.  Did not bring her meter but reports sugars in the 140-150s.  Is able to accurately recall being on Lantus 50units QHS and metformin 1000mg  BID.  Denies polyuria or polydipsia.  P: - continue current regimen - f/u  in 2 months for A1c recheck  Moderate major depression [facility] Depression screen [facility] 2/9 12/03/2016 11/05/2016 06/25/2016  Decreased Interest 0 3 0  Down, Depressed, Hopeless 0 3 0  PHQ - 2 Score 0 6 0  Altered sleeping - 3 -  Tired, decreased energy - 3 -  Change in appetite - 0 -  Feeling bad or failure about yourself  - 1 -  Trouble concentrating - 0 -  Moving slowly or fidgety/restless - 1 -  Suicidal thoughts - 0 -  PHQ-9 Score - 14 -  Difficult doing work/chores - Extremely dIfficult -   A: PHQ today is 0 after starting Wellbutrin and she reports feeling better, less depressed, and able to be more active.  P: - continue Wellbutrin - if  symptoms worsen, would consider adding back SSRI/SNRI to her depression regimen - she is still waiting to hear from [facility] after missing their initial call.  Phone number was provided today.

## 2016-12-03 NOTE — Patient Instructions (Signed)
Thank you for coming to see me today. It was a pleasure. Today we talked about:   Diabetes: Please follow up in 2 months to repeat your A1C.  Keep checking your blood sugars like you are doing and watching your diet.  High Blood Pressure: You blood pressure is above goal. Our pharmacists indicate that you may not be taking your Coreg consistently.  This is important to take twice per day every day.  Continue taking your other medications too. Keep a blood pressure log and let us know if your daily BP is greater than 140 on a consistent basis.  Please follow-up with me in 2 months.  If you have any questions or concerns, please do not hesitate to call the office at [phone].  Take Care,   Jule Ser, DO

## 2016-12-04 NOTE — Assessment & Plan Note (Signed)
BP Readings from Last 3 Encounters:  12/03/16 (!) 151/97  11/05/16 (!) 155/95  06/25/16 (!) 161/99   A: She is here for BP follow up from about 4 weeks ago.  She did not bring her BP log or cuff.  She has been more active since starting Wellbutrin.  Reports home readings in the SBP 120-130s.  None higher than 150 per her report.  Reports adherence.  However, pharmacy reports her Coreg was last filled 07/24/16 for 90 days.  She brings this pill bottle in with her that is dated from September and has many pills in it.  Pharmacy records indicate better adherence with her olmesartan-amlodipine-HCTZ combo pill.  Denies blurry vision, lightheadedness, or headaches.  P: - continue current regimen in light of non-adherence - f/u in 2 months with BP cuff and log.  Asked patient for 2 week f/u but she preferred 2 months when she will be due for her A1c.  Asked her to call clinic for sooner appointment if BP consistently greater than 195 systolic or 90 diastolic.

## 2016-12-04 NOTE — Assessment & Plan Note (Signed)
A: She is here for DM f/u from about 4 weeks ago.  Did not bring her meter but reports sugars in the 140-150s.  Is able to accurately recall being on Lantus 50units QHS and metformin 1000mg  BID.  Denies polyuria or polydipsia.  P: - continue current regimen - f/u in 2 months for A1c recheck

## 2016-12-04 NOTE — Progress Notes (Signed)
S: Kristy [name] is a 61 y.o. female reports to clinic for pharmacist-physician co-visit   No Known Allergies Medication Sig  aspirin 81 MG EC tablet Take 81 mg by mouth daily.    buPROPion (WELLBUTRIN XL) 150 MG 24 hr tablet Take 150mg  (1 tablet) every morning for 1 week, then increase to 300mg  (2 tablets) every morning after that.  carvedilol (COREG) 25 MG tablet Take 1 tablet (25 mg total) by mouth 2 (two) times daily.  Cholecalciferol 1000 units capsule Take 1 capsule (1,000 Units total) by mouth daily.  glucose blood (RELION PRIME TEST) test strip To use in glucose monitor to obtain blood sugar 3 to 4 times daily. diag code 250.62. Insulin dependent  glucose blood test strip 250.0 Use as instructed  insulin glargine (LANTUS) 100 UNIT/ML injection INJECT 50 UNITS INTO THE SKIN AT BEDTIME.  Insulin Pen Needle (B-D UF III MINI PEN NEEDLES) 31G X 5 MM MISC 1 Device by Does not apply route 3 (three) times daily before meals. Check daily 3X before meals  Insulin Syringe-Needle U-100 (INSULIN SYRINGE .5CC/31GX5/16") 31G X 5/16" 0.5 ML MISC Inject 1 Dose into the skin daily. The patient is insulin requiring, ICD 10 code E11.42. Insulin 1 time daily  metFORMIN (GLUCOPHAGE) 1000 MG tablet Take 1 tablet (1,000 mg total) by mouth 2 (two) times daily with a meal.  Olmesartan-Amlodipine-HCTZ 40-10-25 MG TABS Take 1 tablet by mouth daily.  pravastatin (PRAVACHOL) 80 MG tablet Take 1 tablet (80 mg total) by mouth daily.  traMADol (ULTRAM) 50 MG tablet Take 1 tablet (50 mg total) by mouth every 6 (six) hours as needed (may fill 30 days after last refill).   Past Medical History:  Diagnosis Date  . Colon polyps   . Colon polyps   . Diabetes mellitus   . Diverticulosis   . Hyperlipidemia   . Hypertension   . 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 Albertine Patricia  . Sleep apnea 2007    status post polysomnogram 2007 , suggested use of CPAP  . Ventricular hypertrophy 2006    a 2-D echo in 2006, ejection fraction 55%, mild tricuspid regurgitation noted as well, 2-D echo November 13, 4625 showed diastolic dysfunction with LVEF normal of 65%   Social History   Social History  . Marital status: Married    Spouse name: N/A  . Number of children: N/A  . Years of education: 60   Social History Main Topics  . Smoking status: Former Smoker    Packs/day: 0.33    Years: 25.00    Quit date: 10/26/2005  . Smokeless tobacco: Never Used  . Alcohol use 0.0 oz/week     Comment: occasional  . Drug use: No  . Sexual activity: Not Currently   Other Topics Concern  . Not on file   Social History Narrative   Lives with husband and two sons.    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   Completed to 11th grade.   Family History  Problem Relation Age of Onset  . Diabetes Mother   . Cancer Mother   . Diabetes Father   . Stroke Neg Hx   . Cancer Other     breast cancer died in her 3s   O:  Component Value Date/Time   CHOL 196 01/29/2016 1447   HDL 45 01/29/2016 1447   TRIG 119 01/29/2016 1447   AST 33 06/09/2011 0937   ALT 45 (H) 06/09/2011 0937   NA 141 06/25/2016 1646   K 4.2 06/25/2016 1646   CL 99 06/25/2016 1646   CO2 23 06/25/2016 1646   GLUCOSE 178 (H) 06/25/2016 1646   GLUCOSE 117 (H) 01/10/2015 1534   HGBA1C 10.3 11/05/2016 1546   HGBA1C 6.6 09/30/2010 1444   BUN 17 06/25/2016 1646   CREATININE 1.13 (H) 06/25/2016 1646   CREATININE 1.11 (H) 01/10/2015 1534   CALCIUM 10.0 06/25/2016 1646   GFRAA 61 06/25/2016 1646   GFRAA 63 01/10/2015 1534   WBC 7.8 08/09/2014 1156   HGB 15.7 (H) 08/09/2014 1156   HCT 44.9 08/09/2014 1156   PLT 232 08/09/2014 1156   TSH 1.241 08/09/2014 1156   Ht Readings from Last 2 Encounters:  12/03/16 5\' 2"  (1.575 m)   11/05/16 5\' 2"  (1.575 m)   Wt Readings from Last 2 Encounters:  12/03/16 217 lb 12.8 oz (98.8 kg)  11/05/16 217 lb 4.8 oz (98.6 kg)   There is no height or weight on file to calculate BMI. BP Readings from Last 3 Encounters:  12/03/16 (!) 151/97  11/05/16 (!) 155/95  06/25/16 (!) 161/99   A/P: Reviewed medications with the patient, including name, instructions, indication, goals of therapy, potential side effects, importance of adherence, and safe use.  Patient brought medication bottles to visit. Of note, pravastatin, carvedilol, and metformin were last filled in Sep 2017 but pills remain in the bottle. Reinforced the importance of adherence and processed refills  Patient verbalized understanding by repeating back information and was advised to contact me if further medication-related questions arise. Patient was also provided an information handout.  An after visit summary was provided and patient advised to follow up if any changes in condition or questions regarding medications arise.   The patient verbalized understanding of information provided by repeating back concepts discussed.

## 2016-12-04 NOTE — Assessment & Plan Note (Signed)
Depression screen [facility] 2/9 12/03/2016 11/05/2016 06/25/2016  Decreased Interest 0 3 0  Down, Depressed, Hopeless 0 3 0  PHQ - 2 Score 0 6 0  Altered sleeping - 3 -  Tired, decreased energy - 3 -  Change in appetite - 0 -  Feeling bad or failure about yourself  - 1 -  Trouble concentrating - 0 -  Moving slowly or fidgety/restless - 1 -  Suicidal thoughts - 0 -  PHQ-9 Score - 14 -  Difficult doing work/chores - Extremely dIfficult -   A: PHQ today is 0 after starting Wellbutrin and she reports feeling better, less depressed, and able to be more active.  P: - continue Wellbutrin - if symptoms worsen, would consider adding back SSRI/SNRI to her depression regimen - she is still waiting to hear from [facility] after missing their initial call.  Phone number was provided today.

## 2016-12-07 NOTE — Progress Notes (Signed)
Medicine attending: Medical history, presenting problems, physical findings, and medications, reviewed with resident physician Dr Andrew Wallace on the day of the patient visit and I concur with his evaluation and management plan. 

## 2016-12-09 ENCOUNTER — Encounter (HOSPITAL_COMMUNITY): Payer: Self-pay

## 2016-12-11 ENCOUNTER — Telehealth: Payer: Self-pay

## 2016-12-11 ENCOUNTER — Other Ambulatory Visit: Payer: Self-pay | Admitting: Internal Medicine

## 2016-12-11 DIAGNOSIS — Z1231 Encounter for screening mammogram for malignant neoplasm of breast: Secondary | ICD-10-CM

## 2016-12-11 NOTE — Telephone Encounter (Signed)
Spoke w/ pt she stated all her scripts were not at pharm, called pharm, she has all refills there, they were last week waiting on the truck for pravastatin but it is there now, they rec'd all 4 scripts dr Juleen China sent in, the pharmacist stated she needs to call or come to [place] for assistance with meds, informed pt and she stated she would call the pharmacist

## 2016-12-11 NOTE — Telephone Encounter (Signed)
Needs to speak with a nurse regarding meds. Please call back.

## 2016-12-25 ENCOUNTER — Other Ambulatory Visit: Payer: Self-pay | Admitting: Internal Medicine

## 2016-12-25 DIAGNOSIS — E1149 Type 2 diabetes mellitus with other diabetic neurological complication: Secondary | ICD-10-CM

## 2016-12-25 MED ORDER — BUPROPION HCL ER (XL) 300 MG PO TB24: 300.0000 mg | ORAL_TABLET | Freq: Every day | ORAL | 1 refills | Status: DC

## 2016-12-25 MED ORDER — INSULIN GLARGINE 100 UNIT/ML ~~LOC~~ SOLN: SUBCUTANEOUS | 3 refills | Status: DC

## 2016-12-28 ENCOUNTER — Ambulatory Visit (HOSPITAL_COMMUNITY): Payer: Self-pay | Admitting: Psychology

## 2016-12-29 ENCOUNTER — Encounter: Payer: Self-pay | Admitting: Internal Medicine

## 2017-01-04 ENCOUNTER — Other Ambulatory Visit: Payer: Self-pay | Admitting: Internal Medicine

## 2017-01-06 NOTE — Telephone Encounter (Signed)
Checking status of refill

## 2017-01-07 ENCOUNTER — Other Ambulatory Visit: Payer: Self-pay | Admitting: Internal Medicine

## 2017-01-07 NOTE — Telephone Encounter (Signed)
Tramadol rx called to [facility]. 

## 2017-01-19 ENCOUNTER — Encounter (INDEPENDENT_AMBULATORY_CARE_PROVIDER_SITE_OTHER): Payer: Self-pay

## 2017-01-19 ENCOUNTER — Ambulatory Visit (INDEPENDENT_AMBULATORY_CARE_PROVIDER_SITE_OTHER): Payer: 59 | Admitting: Psychology

## 2017-01-19 ENCOUNTER — Encounter (HOSPITAL_COMMUNITY): Payer: Self-pay | Admitting: Psychology

## 2017-01-19 DIAGNOSIS — F321 Major depressive disorder, single episode, moderate: Secondary | ICD-10-CM | POA: Diagnosis not present

## 2017-01-19 NOTE — Progress Notes (Signed)
Comprehensive Clinical Assessment (CCA) Note  01/19/2017 Kristy [name] [id number]  Visit Diagnosis:      ICD-9-CM ICD-10-CM   1. Moderate major depression (HCC) 296.22 F32.1       CCA Part One  Part One has been completed on paper by the patient.  (See scanned document in Chart Review)  CCA Part Two A  Intake/Chief Complaint:  CCA Intake With Chief Complaint CCA Part Two Date: 01/19/17 CCA Part Two Time: 1103 Chief Complaint/Presenting Problem: pt presents for counseling of depressed mood, low motivation. loss of interest.  pt reported that she has been depressed moods for a couple of years.  pt reported that she started on new medication in Jan 2018 by her PCP and this seems to be helping, previous medication didn't help.  pt reported that her major stressor is marital stress.  pt reports that she has been married 40 years and just feels irritated and annoyed by her husband's lack of engagement, self centeredness, driving her friends and family away.  pt reports that they live together but no relationship anymore.   Patients Currently Reported Symptoms/Problems: pt reports that her mood is depressed, pt reports that loss of interest, doesn't go out of the house for a week sometimes and this is unlike her. Pt reports she hasn't been to church for 2 years and this is unlike her.  pt reports wakes throught the night.  Pt reports that she has been getting up again around 9am and this is improvement w/ change in medication.  Pt reports that generally doens't feel motivation for things.  pt denies irritability, pt denies anxiety and worries.  pt denies any SI or HI.  pt denies any hx of trauma.    Collateral Involvement: none Individual's Strengths: seeking tx.  enjoys Advice worker shopping and yard Press photographer.  pt positive relationship w/ kids.  Individual's Preferences: get back out and enjoy life Type of Services Patient Feels Are Needed: counseling  Mental Health Symptoms Depression:   Depression: Change in energy/activity, Sleep (too much or little) (loss of interest and depressed mood)  Mania:  Mania: N/A  Anxiety:   Anxiety: N/A  Psychosis:  Psychosis: N/A  Trauma:  Trauma: N/A  Obsessions:  Obsessions: N/A  Compulsions:  Compulsions: N/A  Inattention:  Inattention: N/A  Hyperactivity/Impulsivity:  Hyperactivity/Impulsivity: N/A  Oppositional/Defiant Behaviors:  Oppositional/Defiant Behaviors: N/A  Borderline Personality:  Emotional Irregularity: N/A  Other Mood/Personality Symptoms:      Mental Status Exam Appearance and self-care  Stature:  Stature: Average  Weight:  Weight: Overweight  Clothing:  Clothing: Neat/clean  Grooming:  Grooming: Normal  Cosmetic use:  Cosmetic Use: Age appropriate  Posture/gait:  Posture/Gait: Normal  Motor activity:  Motor Activity: Not Remarkable  Sensorium  Attention:  Attention: Normal  Concentration:  Concentration: Normal  Orientation:  Orientation: X5  Recall/memory:  Recall/Memory: Normal  Affect and Mood  Affect:  Affect: Appropriate  Mood:  Mood: Depressed  Relating  Eye contact:  Eye Contact: Normal  Facial expression:  Facial Expression: Responsive  Attitude toward examiner:  Attitude Toward Examiner: Cooperative  Thought and Language  Speech flow: Speech Flow: Normal  Thought content:  Thought Content: Appropriate to mood and circumstances  Preoccupation:     Hallucinations:     Organization:     Transport planner of Knowledge:  Fund of Knowledge: Average  Intelligence:  Intelligence: Average  Abstraction:  Abstraction: Normal  Judgement:  Judgement: Normal  Reality Testing:  Reality Testing: Adequate  Insight:  Insight: Good  Decision Making:  Decision Making: Normal  Social Functioning  Social Maturity:  Social Maturity: Responsible  Social Judgement:  Social Judgement: Normal  Stress  Stressors:  Stressors:  (relationship w/ husband)  Coping Ability:  Coping Ability: Deficient supports,  Research officer, political party Deficits:     Supports:      Family and Psychosocial History: Family history Marital status: Married Number of Years Married: 62 What types of issues is patient dealing with in the relationship?: pt reports that lack of engagement and interaction.  husband likes to do things way he wants- doens't consider others.   Additional relationship information: one daughter- her 12y/o granddaughter and 2 sons are currently living in the home.  Are you sexually active?: No Does patient have children?: Yes How many children?: 6 How is patient's relationship with their children?: 4 daughters age 30, 49, 35, 42 and 2 sons age 62 and 85.    Childhood History:  Childhood History By whom was/is the patient raised?: Mother Additional childhood history information: parents separated when she was in 88th grade.   Description of patient's relationship with caregiver when they were a child: Pt reports good relationship w/ her mother.  pt reports her father went to prison for 2 years when she was in the 4th grade and when released went to live w/ his girlfriend and little interaction with.  Patient's description of current relationship with people who raised him/her: both parents are deceased- mom 5years ago; dad 2years ago.  Does patient have siblings?: Yes Number of Siblings: 4 Description of patient's current relationship with siblings: 2 sisters age 65 and 5. Pt reports she talks w/ her younger sister about every other day.  pt reports that since her mother died her other sister doesn't want anything to do w/ her.   1/2 sistera dn 1/2 brother limited contact. 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  Has patient ever been in the [employer]?: No Are There Guns or Other Weapons in [place]?: No  Education: Education Last Grade Completed: 10 Did Teacher, adult education From [employer]?: No Did Physicist, medical?: No  Religion: Religion/Spirituality Are You A Religious Person?: Yes What is Your Religious Affiliation?: Baptist How Might This Affect Treatment?: wont  Leisure/Recreation: Leisure / Recreation Leisure and Hobbies: Advice worker shopping, yard Hotel manager, Materials engineer, church  Exercise/Diet: Exercise/Diet Do You Exercise?: No Have You Gained or Lost A Significant Amount of Weight in the Past Six Months?: No Do You Follow a Special Diet?: No Do You Have Any Trouble Sleeping?: No  CCA Part Two C  Alcohol/Drug Use: Alcohol / Drug Use History of alcohol / drug use?: No history of alcohol / drug abuse  CCA Part Three  ASAM's:  Six Dimensions of Multidimensional Assessment  Dimension 1:  Acute Intoxication and/or Withdrawal Potential:     Dimension 2:  Biomedical Conditions and Complications:     Dimension 3:  Emotional, Behavioral, or Cognitive Conditions and Complications:     Dimension 4:  Readiness to Change:     Dimension 5:  Relapse, Continued use, or Continued Problem Potential:     Dimension 6:  Recovery/Living Environment:      Substance use Disorder (SUD)    Social Function:  Social Functioning Social Maturity: Responsible Social  Judgement: Normal  Stress:  Stress Stressors:  (relationship w/ husband) Coping Ability: Deficient supports, Exhausted Patient Takes Medications The Way The Doctor Instructed?: Yes Priority Risk: Low Acuity  Risk Assessment- Self-Harm Potential: Risk Assessment For Self-Harm Potential Thoughts of Self-Harm: No current thoughts Method: No plan  Risk Assessment -Dangerous to Others Potential: Risk Assessment For Dangerous to Others Potential Method: No Plan  DSM5 Diagnoses: Patient Active Problem List   Diagnosis Date Noted  . Hypercalcemia 06/26/2016  . Vitamin D deficiency 12/07/2014  . Moderate major depression ([place]) 08/09/2014  . Healthcare maintenance 08/23/2013  . 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 Essential hypertension 12/20/2012  . DM type 2 with diabetic peripheral neuropathy ([place]) 12/06/2012  . Diabetic polyneuropathy ([place]) 12/06/2012  . RENAL ARTERY STENOSIS 11/06/2008  . MICROALBUMINURIA 05/12/2007  . Dyslipidemia 07/21/2006    Patient Centered Plan: Patient is on the following Treatment Plan(s):  Depression  Recommendations for Services/Supports/Treatments: Recommendations for Services/Supports/Treatments Recommendations For Services/Supports/Treatments: Individual Therapy  Treatment Plan Summary:    Pt to f/u w/ biweekly counseling to assist coping w/ MDD.  Pt to continue w/ PCP re: medication management.   Jan Fireman

## 2017-01-21 DIAGNOSIS — H35033 Hypertensive retinopathy, bilateral: Secondary | ICD-10-CM | POA: Diagnosis not present

## 2017-01-21 DIAGNOSIS — H524 Presbyopia: Secondary | ICD-10-CM | POA: Diagnosis not present

## 2017-01-21 LAB — HM DIABETES EYE EXAM

## 2017-01-22 ENCOUNTER — Ambulatory Visit
Admission: RE | Admit: 2017-01-22 | Discharge: 2017-01-22 | Disposition: A | Discharge status: Home / Self Care | Payer: Medicare Other | Source: Ambulatory Visit | Attending: Orthopedic Surgery | Admitting: Orthopedic Surgery

## 2017-01-22 DIAGNOSIS — Z1231 Encounter for screening mammogram for malignant neoplasm of breast: Secondary | ICD-10-CM | POA: Diagnosis not present

## 2017-02-04 ENCOUNTER — Ambulatory Visit (INDEPENDENT_AMBULATORY_CARE_PROVIDER_SITE_OTHER): Payer: Medicare Other | Admitting: Internal Medicine

## 2017-02-04 ENCOUNTER — Encounter: Payer: Self-pay | Admitting: Internal Medicine

## 2017-02-04 VITALS — BP 152/92 | HR 88 | Temp 97.9°F | Resp 20 | Ht 62.0 in | Wt 217.0 lb

## 2017-02-04 DIAGNOSIS — E1149 Type 2 diabetes mellitus with other diabetic neurological complication: Secondary | ICD-10-CM

## 2017-02-04 DIAGNOSIS — Z794 Long term (current) use of insulin: Secondary | ICD-10-CM

## 2017-02-04 DIAGNOSIS — Z0000 Encounter for general adult medical examination without abnormal findings: Secondary | ICD-10-CM

## 2017-02-04 DIAGNOSIS — I10 Essential (primary) hypertension: Secondary | ICD-10-CM

## 2017-02-04 DIAGNOSIS — Z6839 Body mass index (BMI) 39.0-39.9, adult: Secondary | ICD-10-CM

## 2017-02-04 DIAGNOSIS — Z87891 Personal history of nicotine dependence: Secondary | ICD-10-CM

## 2017-02-04 DIAGNOSIS — E1142 Type 2 diabetes mellitus with diabetic polyneuropathy: Secondary | ICD-10-CM

## 2017-02-04 DIAGNOSIS — Z79899 Other long term (current) drug therapy: Secondary | ICD-10-CM | POA: Diagnosis not present

## 2017-02-04 LAB — POCT GLYCOSYLATED HEMOGLOBIN (HGB A1C): Hemoglobin A1C: 8.4

## 2017-02-04 LAB — GLUCOSE, CAPILLARY: Glucose-Capillary: 118 mg/dL — ABNORMAL HIGH (ref 65–99)

## 2017-02-04 MED ORDER — "INSULIN SYRINGE 31G X 5/16"" 0.5 ML MISC": 1.0000 | Freq: Every day | 3 refills | Status: DC

## 2017-02-04 MED ORDER — INSULIN GLARGINE 100 UNIT/ML ~~LOC~~ SOLN
SUBCUTANEOUS | 3 refills | Status: DC
Start: 2017-02-04 — End: 2017-09-28

## 2017-02-04 MED ORDER — ACCU-CHEK FASTCLIX LANCETS MISC: 5 refills | Status: DC

## 2017-02-04 MED ORDER — GLUCOSE BLOOD VI STRP: ORAL_STRIP | 5 refills | Status: DC

## 2017-02-04 MED ORDER — ACCU-CHEK GUIDE W/DEVICE KIT: 1.0000 | PACK | Freq: Two times a day (BID) | 1 refills | Status: DC

## 2017-02-04 NOTE — Assessment & Plan Note (Signed)
BP Readings from Last 3 Encounters:  02/04/17 (!) 152/92  12/03/16 (!) 151/97  11/05/16 (!) 155/95   Assessment:  HTN uncontrolled. She reports taking her medication today.  Denies any chest pain, lightheadedness or vision changes.  There has been concern on previous visits for non-adherence.  She also reports at home readings of 737-366K systolic, however, she has not brought in her BP cuff to a clinic visit.  Reports taking BP 1 time per week on average.  Plan: - given her report of well-controlled BP at home, I have asked her to RTC in 2 weeks with BP log and to check her BP about 3 times per week - will not make changes today, but if continues to be uncontrolled would recommend adjustment in her medications - discussed diet and exercise as well as weight loss. Body mass index is 39.69 kg/m.  - for now, continue combination olmesartan-amlodipine-hctz 40-10-25mg  and carvedilol 25mg  BID

## 2017-02-04 NOTE — Assessment & Plan Note (Signed)
Lab Results  Component Value Date   HGBA1C 8.4 02/04/2017   Assessment: Improving glycemic control. A1c today is 8.4, improved from 10.3 last time.  She is on Lantus 50units QHS and metformin 1000mg  BID.  She reports having her eye exam last week and there were no new issues.  Her meter today shows a daily average of 138 with range of 93-202.  She has dietary indiscretions with eating lots of carbs (bread, pasta) but is able to avoid sugary drinks for the most part.  Plan: - increase Lantus to 52 units QHS - better dietary decisions.  She declines referral to our DM educator today - continue metformin 1000mg  BID - I think with improved dietary choices and minimal increase in insulin, we can achieve her A1c goal if she is motivated to do so.

## 2017-02-04 NOTE — Progress Notes (Signed)
Patient was provided with a sample Accu chek guide meter today per her request. Meter set up was done and patient was educated how to use the meter. Request prescriptions for supplies for it.  Severin Bou, Butch Penny, [place] 02/04/2017 3:43 PM.

## 2017-02-04 NOTE — Progress Notes (Signed)
CC: here for DM follow up  HPI:  Kristy [name] is a 61 y.o. woman with a past medical history listed below here today for follow up of her diabetes.  For details of today's visit and the status of her chronic medical issues please refer to the assessment and plan.   Past Medical History:  Diagnosis Date  . Colon polyps   . Colon polyps   . Diabetes mellitus   . Diverticulosis   . Hyperlipidemia   . Hypertension   . 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 Albertine Patricia  . Sleep apnea 2007    status post polysomnogram 2007 , suggested use of CPAP  . Ventricular hypertrophy 2006    a 2-D echo in 2006, ejection fraction 55%, mild tricuspid regurgitation noted as well, 2-D echo November 13, 1608 showed diastolic dysfunction with LVEF normal of 65%    Review of Systems:  Please see pertinent ROS reviewed in HPI and problem based charting.   Physical Exam:  Vitals:   02/04/17 1437  BP: (!) 152/92  Pulse: 88  Resp: 20  Temp: 97.9 F (36.6 C)  TempSrc: Oral  SpO2: 100%  Weight: 217 lb (98.4 kg)  Height: 5\' 2"  (1.575 m)   Physical Exam  Constitutional: She is oriented to person, place, and time and well-developed, well-nourished, and in no distress.  HENT:  Head: Normocephalic and atraumatic.  Eyes: Conjunctivae and EOM are normal.  Cardiovascular: Normal rate and regular rhythm.   Pulmonary/Chest: Effort normal and breath sounds normal.  Musculoskeletal: Normal range of motion. She exhibits no edema.  Neurological: She is alert and oriented to person, place, and time.  Skin: Skin is warm and dry.  Psychiatric: Mood and affect normal.     Assessment & Plan:   See Encounters Tab for problem based charting.  Patient discussed with Dr. Dareen Piano.  Essential  hypertension BP Readings from Last 3 Encounters:  02/04/17 (!) 152/92  12/03/16 (!) 151/97  11/05/16 (!) 155/95   Assessment:  HTN uncontrolled. She reports taking her medication today.  Denies any chest pain, lightheadedness or vision changes.  There has been concern on previous visits for non-adherence.  She also reports at home readings of 960-454U systolic, however, she has not brought in her BP cuff to a clinic visit.  Reports taking BP 1 time per week on average.  Plan: - given her report of well-controlled BP at home, I have asked her to RTC in 2 weeks with BP log and to check her BP about 3 times per week - will not make changes today, but if continues to be uncontrolled would recommend adjustment in her medications - discussed diet and exercise as well as weight loss. Body mass index is 39.69 kg/m.  - for now, continue combination olmesartan-amlodipine-hctz 40-10-25mg  and carvedilol 25mg  BID   DM type 2 with diabetic peripheral neuropathy Lab Results  Component Value Date   HGBA1C 8.4 02/04/2017   Assessment: Improving glycemic control. A1c today is 8.4, improved from 10.3 last time.  She is on Lantus 50units QHS and metformin 1000mg  BID.  She reports having her eye exam last week and there were no new issues.  Her meter today shows a daily average of 138 with range of 93-202.  She has dietary  indiscretions with eating lots of carbs (bread, pasta) but is able to avoid sugary drinks for the most part.  Plan: - increase Lantus to 52 units QHS - better dietary decisions.  She declines referral to our DM educator today - continue metformin 1000mg  BID - I think with improved dietary choices and minimal increase in insulin, we can achieve her A1c goal if she is motivated to do so.  Healthcare maintenance Assessment/ Plan: She is due for eye exam and pap smear per our records.  She reports having eye exam last week. I offered pap smear today.  She prefers female provider and  morning appointment so have asked her to schedule in [facility]

## 2017-02-04 NOTE — Patient Instructions (Signed)
Thank you for coming to see me today. It was a pleasure. Today we talked about:   Pap Smear: please schedule an appointment for this in our [facility] clinic with a female provider.  Please also bring your blood pressure log to this appointment  Diabetes: increase your Lantus to 52 units at bedtime.  Keep up the good work  Please follow-up with [place] in 1-2 weeks for Blood pressure and Pap smear.  If you have any questions or concerns, please do not hesitate to call the office at [phone].  Take Care,   Jule Ser, DO

## 2017-02-04 NOTE — Assessment & Plan Note (Signed)
Assessment/ Plan: She is due for eye exam and pap smear per our records.  She reports having eye exam last week. I offered pap smear today.  She prefers female provider and morning appointment so have asked her to schedule in [facility]

## 2017-02-09 NOTE — Progress Notes (Signed)
Internal Medicine Clinic Attending  Case discussed with Dr. Wallace at the time of the visit.  We reviewed the resident's history and exam and pertinent patient test results.  I agree with the assessment, diagnosis, and plan of care documented in the resident's note.  

## 2017-02-10 ENCOUNTER — Encounter: Payer: Self-pay | Admitting: *Deleted

## 2017-02-18 ENCOUNTER — Ambulatory Visit (HOSPITAL_COMMUNITY): Payer: 59 | Admitting: Psychology

## 2017-02-18 ENCOUNTER — Ambulatory Visit (INDEPENDENT_AMBULATORY_CARE_PROVIDER_SITE_OTHER): Payer: Medicare Other | Admitting: Internal Medicine

## 2017-02-18 ENCOUNTER — Other Ambulatory Visit: Payer: Self-pay | Admitting: Internal Medicine

## 2017-02-18 ENCOUNTER — Encounter (INDEPENDENT_AMBULATORY_CARE_PROVIDER_SITE_OTHER): Payer: Self-pay

## 2017-02-18 ENCOUNTER — Encounter: Payer: Self-pay | Admitting: Internal Medicine

## 2017-02-18 VITALS — BP 138/81 | HR 88 | Temp 98.2°F | Ht 62.0 in | Wt 215.1 lb

## 2017-02-18 DIAGNOSIS — Z1151 Encounter for screening for human papillomavirus (HPV): Secondary | ICD-10-CM

## 2017-02-18 DIAGNOSIS — Z124 Encounter for screening for malignant neoplasm of cervix: Secondary | ICD-10-CM

## 2017-02-18 DIAGNOSIS — I10 Essential (primary) hypertension: Secondary | ICD-10-CM

## 2017-02-18 DIAGNOSIS — Z87891 Personal history of nicotine dependence: Secondary | ICD-10-CM

## 2017-02-18 DIAGNOSIS — Z79899 Other long term (current) drug therapy: Secondary | ICD-10-CM

## 2017-02-18 DIAGNOSIS — Z01419 Encounter for gynecological examination (general) (routine) without abnormal findings: Secondary | ICD-10-CM

## 2017-02-18 DIAGNOSIS — E669 Obesity, unspecified: Secondary | ICD-10-CM | POA: Diagnosis not present

## 2017-02-18 NOTE — Progress Notes (Signed)
   CC: Hypertension follow up, pap smear  HPI:  [name] is a 61 y.o. woman with PMHx as noted below who presents today for follow up of her hypertension and a pap smear.  HTN: BP 138/81 today. She is taking Coreg 25 mg BID and Olmesartan-Amlodipine-HCTZ 40-10-25 mg daily. She reports her home BPs have been in the 161W-960A systolic.   Pap smear: Reports it has been over 30 years since her last pap smear. She does not recall any prior abnormal pap smears. She denies any vaginal discharge, itching, or bleeding. She stopped having menstrual periods about 2-3 years ago. She does not wish to have STD testing today. Reports she was last sexually active with her husband about 7 years ago.   Past Medical History:  Diagnosis Date  . Colon polyps   . Colon polyps   . Diabetes mellitus   . Diverticulosis   . Hyperlipidemia   . Hypertension   . 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 Albertine Patricia  . Sleep apnea 2007    status post polysomnogram 2007 , suggested use of CPAP  . Ventricular hypertrophy 2006    a 2-D echo in 2006, ejection fraction 55%, mild tricuspid regurgitation noted as well, 2-D echo November 14, 5407 showed diastolic dysfunction with LVEF normal of 65%    Review of Systems:   All negative except per HPI  Physical Exam:  Vitals:   02/18/17 1323  BP: 138/81  Pulse: 88  Temp: 98.2 F (36.8 C)  TempSrc: Oral  SpO2: 99%  Weight: 215 lb 1.6 oz (97.6 kg)  Height: 5\' 2"  (1.575 m)   General: Obese woman in NAD CV: RRR, no m/g/r GU: External genitalia appear normal. Vaginal canal and cervix appear normal. No discharge noted.   Assessment & Plan:   See Encounters Tab for problem based charting.  Patient discussed with Dr. Evette Doffing

## 2017-02-18 NOTE — Patient Instructions (Signed)
General Instructions: - Remember to bring a home blood pressure log when you follow up with Dr. Juleen China - Will call you with pap smear results   Please bring your medicines with you each time you come to clinic.  Medicines may include prescription medications, over-the-counter medications, herbal remedies, eye drops, vitamins, or other pills.   Progress Toward Treatment Goals:  Treatment Goal 04/03/2015  Hemoglobin A1C improved  Blood pressure unchanged  Prevent falls -    Self Care Goals & Plans:  Self Care Goal 12/03/2016  Manage my medications take my medicines as prescribed; bring my medications to every visit; refill my medications on time  Monitor my health keep track of my blood glucose; bring my glucose meter and log to each visit  Eat healthy foods drink diet soda or water instead of juice or soda; eat more vegetables; eat foods that are low in salt; eat baked foods instead of fried foods; eat fruit for snacks and desserts  Be physically active find an activity I enjoy  Prevent falls -  Meeting treatment goals maintain the current self-care plan    Home Blood Glucose Monitoring 02/04/2017  Check my blood sugar once a day  When to check my blood sugar -     Care Management & Community Referrals:  Referral 04/03/2015  Referrals made for care management support none needed  Referrals made to community resources none

## 2017-02-19 NOTE — Assessment & Plan Note (Signed)
Pap smear performed today. Will follow up results.

## 2017-02-19 NOTE — Assessment & Plan Note (Signed)
BP controlled with Coreg 25 mg BID and Olmesartan-Amlodipine-HCTZ 40-10-25 mg daily. Advised to bring her BP log when she follows up with her PCP in 3 months.

## 2017-02-22 NOTE — Progress Notes (Signed)
Internal Medicine Clinic Attending  Case discussed with Dr. Rivet at the time of the visit.  We reviewed the resident's history and exam and pertinent patient test results.  I agree with the assessment, diagnosis, and plan of care documented in the resident's note.  

## 2017-02-23 LAB — PAP LB, RFX HPV ASCU: PAP Smear Comment: 0

## 2017-03-02 ENCOUNTER — Telehealth: Payer: Self-pay | Admitting: Internal Medicine

## 2017-03-02 NOTE — Telephone Encounter (Signed)
Tried calling patient on all 3 phone numbers but no answer and unable to leave message. If patient returns call, please notify that pap smear results were negative. Will try again later.

## 2017-03-08 ENCOUNTER — Other Ambulatory Visit: Payer: Self-pay | Admitting: Internal Medicine

## 2017-03-08 MED ORDER — TRAMADOL HCL 50 MG PO TABS: ORAL_TABLET | ORAL | 1 refills | Status: DC

## 2017-03-08 NOTE — Telephone Encounter (Signed)
PT NEEDS REFILL ON PAIN MEDICATION, TO [facility]

## 2017-03-08 NOTE — Telephone Encounter (Signed)
Last rx written 01/06/17 x 1 refill. Last appt 02/18/17. Next appt 8/9/18UDS 07/28/17.

## 2017-03-09 NOTE — Telephone Encounter (Signed)
Tramadol rx called to [facility] at [place] pt's request.

## 2017-05-04 ENCOUNTER — Other Ambulatory Visit: Payer: Self-pay | Admitting: Internal Medicine

## 2017-05-10 ENCOUNTER — Other Ambulatory Visit: Payer: Self-pay

## 2017-05-10 NOTE — Telephone Encounter (Signed)
Requesting tramadol to be filled @ [facility] on [address].

## 2017-05-12 MED ORDER — TRAMADOL HCL 50 MG PO TABS: ORAL_TABLET | ORAL | 1 refills | Status: DC

## 2017-05-12 NOTE — Telephone Encounter (Signed)
Pt is calling back to speak with a nurse about Tramadol. Please call back.  

## 2017-05-12 NOTE — Telephone Encounter (Signed)
Last rx written 03/08/17.  Next appt 06/03/17.

## 2017-05-12 NOTE — Telephone Encounter (Addendum)
Tramadol rx called to [facility]. Pt called and informed.

## 2017-05-20 ENCOUNTER — Encounter: Payer: Self-pay | Admitting: Internal Medicine

## 2017-06-03 ENCOUNTER — Encounter: Payer: Self-pay | Admitting: Internal Medicine

## 2017-06-03 ENCOUNTER — Ambulatory Visit (INDEPENDENT_AMBULATORY_CARE_PROVIDER_SITE_OTHER): Payer: Medicare Other | Admitting: Internal Medicine

## 2017-06-03 VITALS — BP 157/95 | HR 86 | Temp 98.3°F | Ht 62.0 in | Wt 215.4 lb

## 2017-06-03 DIAGNOSIS — I10 Essential (primary) hypertension: Secondary | ICD-10-CM | POA: Diagnosis not present

## 2017-06-03 DIAGNOSIS — Z79899 Other long term (current) drug therapy: Secondary | ICD-10-CM

## 2017-06-03 DIAGNOSIS — Z794 Long term (current) use of insulin: Secondary | ICD-10-CM | POA: Diagnosis not present

## 2017-06-03 DIAGNOSIS — N183 Chronic kidney disease, stage 3 unspecified: Secondary | ICD-10-CM

## 2017-06-03 DIAGNOSIS — Z87891 Personal history of nicotine dependence: Secondary | ICD-10-CM | POA: Diagnosis not present

## 2017-06-03 DIAGNOSIS — E1142 Type 2 diabetes mellitus with diabetic polyneuropathy: Secondary | ICD-10-CM

## 2017-06-03 DIAGNOSIS — E785 Hyperlipidemia, unspecified: Secondary | ICD-10-CM

## 2017-06-03 LAB — POCT GLYCOSYLATED HEMOGLOBIN (HGB A1C): HEMOGLOBIN A1C: 7.4

## 2017-06-03 LAB — GLUCOSE, CAPILLARY: GLUCOSE-CAPILLARY: 223 mg/dL — AB (ref 65–99)

## 2017-06-03 MED ORDER — METFORMIN HCL ER 500 MG PO TB24
2000.0000 mg | ORAL_TABLET | Freq: Every day | ORAL | 5 refills | Status: DC
Start: 2017-06-03 — End: 2017-11-26

## 2017-06-03 MED ORDER — SPIRONOLACTONE 25 MG PO TABS: 25.0000 mg | ORAL_TABLET | Freq: Every day | ORAL | 3 refills | Status: DC

## 2017-06-03 NOTE — Patient Instructions (Signed)
Thank you for coming to see me today. It was a pleasure. Today we talked about:   Diabetes: - you are doing great!  Your A1c is almost at goal less than 7 - Stop taking the metformin twice per day - I have sent a NEW prescription for daily metformin.  You will still take 2000mg  daily (4 pills)  Blood Pressure: - I am adding spironolactone to your regimen.  Take this medication 1 time per day.  Please follow-up with me in 1 month.  I'll let you know about any abnormal labs  If you have any questions or concerns, please do not hesitate to call the office at [phone].  Take Care,   Jule Ser, DO

## 2017-06-03 NOTE — Assessment & Plan Note (Signed)
Assessment: She is on pravastatin 80mg  daily.  She reports adherence.  Plan: - Check lipids today to assess for adherence.

## 2017-06-03 NOTE — Assessment & Plan Note (Addendum)
BP Readings from Last 3 Encounters:  06/03/17 (!) 157/95  02/18/17 138/81  02/04/17 (!) 152/92   Assessment:  BP remains above goal in this patient with history of DM.  Initial 157/95 and repeat was 165/96.  Reports taking her meds today.  No HA, blurry vision, CP.  Plan: - continue combination olmesartan-amlodipine-hctz 40-10-25mg  daily - continue carvedilol 25mg  BID - add spironolactone 25mg  daily - BMET today - RTC 1 month for follow up with me.  Addendum:  - creatinine 1.4 today, increased from 11 months ago when it was 1.1.  Since 2010, has fluctuated between 1-1.4.  Will plan to repeat BMET at f/u in 1 month with starting of spironolactone.  Further CKD evaluation at that time as well.  Called patient and spoke with her to update.

## 2017-06-03 NOTE — Assessment & Plan Note (Signed)
Assessment: A1c today is 7.4, improved from 8.4 in April 2018.  She reports "trying to eat right but it is hard to do".  She reports adherence to Lantus 52 units QHS and metformin 1000mg  BID.  She reports the metformin upsets her stomach taking BID.  Denies any symptoms of hyper or hypoglycemia.  Plan: - Stop metformin 1000mg  BID - Start metformin XR 1000mg  daily to help with any GI upset.  May need to transition to another oral agent if not tolerated. - Continue Lantus 52 units QHS - Recommended continued dietary modification to reach goal.

## 2017-06-03 NOTE — Progress Notes (Addendum)
   CC: here for f/u BP and DM  HPI:  [name] is a 61 y.o. woman with a past medical history listed below here today for follow up of her HTN and DM.  For details of today's visit and the status of her chronic medical issues please refer to the assessment and plan.   Past Medical History:  Diagnosis Date  . Colon polyps   . Colon polyps   . Diabetes mellitus   . Diverticulosis   . Hyperlipidemia   . Hypertension   . 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 Albertine Patricia  . Sleep apnea 2007    status post polysomnogram 2007 , suggested use of CPAP  . Ventricular hypertrophy 2006    a 2-D echo in 2006, ejection fraction 55%, mild tricuspid regurgitation noted as well, 2-D echo November 13, 1948 showed diastolic dysfunction with LVEF normal of 65%   Review of Systems:  Please see pertinent ROS reviewed in HPI and problem based charting.   Physical Exam:  Vitals:   06/03/17 1316  BP: (!) 157/95  Pulse: 86  Temp: 98.3 F (36.8 C)  TempSrc: Oral  SpO2: 100%  Weight: 215 lb 6.4 oz (97.7 kg)  Height: 5\' 2"  (1.575 m)   General: sitting in chair, NAD HEENT: [place]/AT, EOMI, no scleral icterus Cardiac: RRR Pulm: normal effort Ext: warm and well perfused, no pedal edema Neuro: alert and oriented X3, cranial nerves II-XII grossly intact   Assessment & Plan:   See Encounters Tab for problem based charting.  Patient discussed with Dr. Angelia Mould .  Essential hypertension BP Readings from Last 3 Encounters:  06/03/17 (!) 157/95  02/18/17 138/81  02/04/17 (!) 152/92   Assessment:  BP remains above goal in this patient with history of DM.  Initial 157/95 and repeat was 165/96.  Reports taking her meds today.  No HA, blurry vision, CP.  Plan: - continue combination  olmesartan-amlodipine-hctz 40-10-25mg  daily - continue carvedilol 25mg  BID - add spironolactone 25mg  daily - BMET today - RTC 1 month for follow up with me.  DM type 2 with diabetic peripheral neuropathy (HCC) Assessment: A1c today is 7.4, improved from 8.4 in April 2018.  She reports "trying to eat right but it is hard to do".  She reports adherence to Lantus 52 units QHS and metformin 1000mg  BID.  She reports the metformin upsets her stomach taking BID.  Denies any symptoms of hyper or hypoglycemia.  Plan: - Stop metformin 1000mg  BID - Start metformin XR 1000mg  daily to help with any GI upset.  May need to transition to another oral agent if not tolerated. - Continue Lantus 52 units QHS - Recommended continued dietary modification to reach goal.  Dyslipidemia Assessment: She is on pravastatin 80mg  daily.  She reports adherence.  Plan: - Check lipids today to assess for adherence.

## 2017-06-04 DIAGNOSIS — N183 Chronic kidney disease, stage 3 (moderate): Secondary | ICD-10-CM

## 2017-06-04 DIAGNOSIS — N184 Chronic kidney disease, stage 4 (severe): Secondary | ICD-10-CM | POA: Insufficient documentation

## 2017-06-04 LAB — BMP8+ANION GAP
ANION GAP: 15 mmol/L (ref 10.0–18.0)
BUN/Creatinine Ratio: 14 (ref 12–28)
BUN: 21 mg/dL (ref 8–27)
CHLORIDE: 100 mmol/L (ref 96–106)
CO2: 22 mmol/L (ref 20–29)
CREATININE: 1.48 mg/dL — AB (ref 0.57–1.00)
Calcium: 10.1 mg/dL (ref 8.7–10.3)
GFR calc Af Amer: 44 mL/min/{1.73_m2} — ABNORMAL LOW (ref >59)
GFR calc non Af Amer: 38 mL/min/{1.73_m2} — ABNORMAL LOW (ref >59)
Glucose: 239 mg/dL — ABNORMAL HIGH (ref 65–99)
POTASSIUM: 4.1 mmol/L (ref 3.5–5.2)
SODIUM: 137 mmol/L (ref 134–144)

## 2017-06-04 LAB — CBC WITH DIFFERENTIAL/PLATELET
BASOS ABS: 0.1 10*3/uL (ref 0.0–0.2)
BASOS: 1 %
EOS (ABSOLUTE): 0.1 10*3/uL (ref 0.0–0.4)
Eos: 1 %
Hematocrit: 41.9 % (ref 34.0–46.6)
Hemoglobin: 13.1 g/dL (ref 11.1–15.9)
IMMATURE GRANS (ABS): 0.1 10*3/uL (ref 0.0–0.1)
IMMATURE GRANULOCYTES: 1 %
LYMPHS: 22 %
Lymphocytes Absolute: 1.7 10*3/uL (ref 0.7–3.1)
MCH: 25.6 pg — ABNORMAL LOW (ref 26.6–33.0)
MCHC: 31.3 g/dL — ABNORMAL LOW (ref 31.5–35.7)
MCV: 82 fL (ref 79–97)
Monocytes Absolute: 0.6 10*3/uL (ref 0.1–0.9)
Monocytes: 7 %
NEUTROS PCT: 68 %
Neutrophils Absolute: 5.5 10*3/uL (ref 1.4–7.0)
Platelets: 245 10*3/uL (ref 150–379)
RBC: 5.12 x10E6/uL (ref 3.77–5.28)
RDW: 15.3 % (ref 12.3–15.4)
WBC: 8 10*3/uL (ref 3.4–10.8)

## 2017-06-04 LAB — LIPID PANEL
CHOLESTEROL TOTAL: 161 mg/dL (ref 100–199)
Chol/HDL Ratio: 3.7 ratio (ref 0.0–4.4)
HDL: 44 mg/dL (ref >39)
LDL CALC: 89 mg/dL (ref 0–99)
Triglycerides: 140 mg/dL (ref 0–149)
VLDL Cholesterol Cal: 28 mg/dL (ref 5–40)

## 2017-06-07 NOTE — Progress Notes (Signed)
Internal Medicine Clinic Attending  Case discussed with Dr. Wallace at the time of the visit.  We reviewed the resident's history and exam and pertinent patient test results.  I agree with the assessment, diagnosis, and plan of care documented in the resident's note.  

## 2017-06-24 ENCOUNTER — Encounter: Payer: Self-pay | Admitting: Internal Medicine

## 2017-07-15 ENCOUNTER — Ambulatory Visit (INDEPENDENT_AMBULATORY_CARE_PROVIDER_SITE_OTHER): Payer: Medicare Other | Admitting: Internal Medicine

## 2017-07-15 ENCOUNTER — Encounter: Payer: Self-pay | Admitting: Internal Medicine

## 2017-07-15 VITALS — BP 185/97 | HR 90 | Temp 97.9°F | Ht 62.0 in | Wt 217.9 lb

## 2017-07-15 DIAGNOSIS — N183 Chronic kidney disease, stage 3 unspecified: Secondary | ICD-10-CM

## 2017-07-15 DIAGNOSIS — Z23 Encounter for immunization: Secondary | ICD-10-CM | POA: Diagnosis not present

## 2017-07-15 DIAGNOSIS — I129 Hypertensive chronic kidney disease with stage 1 through stage 4 chronic kidney disease, or unspecified chronic kidney disease: Secondary | ICD-10-CM

## 2017-07-15 DIAGNOSIS — M1711 Unilateral primary osteoarthritis, right knee: Secondary | ICD-10-CM | POA: Diagnosis not present

## 2017-07-15 DIAGNOSIS — I10 Essential (primary) hypertension: Secondary | ICD-10-CM

## 2017-07-15 DIAGNOSIS — Z79891 Long term (current) use of opiate analgesic: Secondary | ICD-10-CM | POA: Diagnosis not present

## 2017-07-15 DIAGNOSIS — Z79899 Other long term (current) drug therapy: Secondary | ICD-10-CM

## 2017-07-15 DIAGNOSIS — E1122 Type 2 diabetes mellitus with diabetic chronic kidney disease: Secondary | ICD-10-CM

## 2017-07-15 LAB — GLUCOSE, CAPILLARY: GLUCOSE-CAPILLARY: 107 mg/dL — AB (ref 65–99)

## 2017-07-15 MED ORDER — SPIRONOLACTONE 50 MG PO TABS: 50.0000 mg | ORAL_TABLET | Freq: Every day | ORAL | 1 refills | Status: DC

## 2017-07-15 MED ORDER — SPIRONOLACTONE 50 MG PO TABS: 25.0000 mg | ORAL_TABLET | Freq: Every day | ORAL | 1 refills | Status: DC

## 2017-07-15 MED ORDER — TRAMADOL HCL 50 MG PO TABS: ORAL_TABLET | ORAL | 1 refills | Status: DC

## 2017-07-15 NOTE — Progress Notes (Signed)
   CC: here for HTN and CKD follow up  HPI:  [name] is a 61 y.o. woman with a past medical history listed below here today for follow up of her HTN and CKD.  For details of today's visit and the status of her chronic medical issues please refer to the assessment and plan.   Past Medical History:  Diagnosis Date  . Colon polyps   . Colon polyps   . Diabetes mellitus   . Diverticulosis   . Hyperlipidemia   . Hypertension   . 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 Albertine Patricia  . Sleep apnea 2007    status post polysomnogram 2007 , suggested use of CPAP  . Ventricular hypertrophy 2006    a 2-D echo in 2006, ejection fraction 55%, mild tricuspid regurgitation noted as well, 2-D echo November 13, 6142 showed diastolic dysfunction with LVEF normal of 65%   Review of Systems:  Please see pertinent ROS reviewed in HPI and problem based charting.   Physical Exam:  Vitals:   07/15/17 1558  BP: (!) 185/97  Pulse: 90  Temp: 97.9 F (36.6 C)  TempSrc: Oral  SpO2: 98%  Weight: 217 lb 14.4 oz (98.8 kg)  Height: 5\' 2"  (1.575 m)   General: NAD HEENT: NCAT, EOMI, no scleral icterus Pulm: normal effort Neuro: alert and oriented X3, cranial nerves II-XII grossly intact   Assessment & Plan:   See Encounters Tab for problem based charting.  Patient discussed with Dr. Angelia Mould.  Essential hypertension BP Readings from Last 3 Encounters:  07/15/17 (!) 185/97  06/03/17 (!) 157/95  02/18/17 138/81   Assessment: BP has deteriorated since August visit despite the addition of Spironolactone.  She reports adherence and brought her medication bottles in today, however, I am concerned for this.  HR today is 90 despite 25mg  BID dose of Coreg.  She denies any vision change,  HA, CP.  Plan: - increase spironolactone to 50mg  daily - continue olmesartan-amlodipine-hctz 40-10-25mg  daily - continue coreg 25mg  BID - BMET today with stable electrolytes and CKD - RTC 2 months  - will reach out to pharmacy to assess adherence  CKD (chronic kidney disease) stage 3, GFR 30-59 ml/min Assessment: Stable CKD 3 likely due to longstanding HTN and diabetes.  No recent UA or baseline CKD screening labs as best I can tell.  She reports seeing Dr. Moshe Cipro several years ago with nephrology but has not been back to follow up recently.  Plan: - UA today with 2+ proteinuria.  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   -refill Tramadol #120 for 2 months

## 2017-07-15 NOTE — Patient Instructions (Signed)
Referral placed to the kidney doctor.  They will call you for appointment.  We have increased your Spironolactone to 50mg  daily.  I will let you know of any abnormal labs.

## 2017-07-16 LAB — MICROSCOPIC EXAMINATION: Casts: NONE SEEN /lpf

## 2017-07-16 LAB — URINALYSIS, ROUTINE W REFLEX MICROSCOPIC
BILIRUBIN UA: NEGATIVE
Glucose, UA: NEGATIVE
Ketones, UA: NEGATIVE
Leukocytes, UA: NEGATIVE
NITRITE UA: NEGATIVE
PH UA: 7 (ref 5.0–7.5)
RBC UA: NEGATIVE
Specific Gravity, UA: 1.014 (ref 1.005–1.030)
UUROB: 1 mg/dL (ref 0.2–1.0)

## 2017-07-16 NOTE — Assessment & Plan Note (Signed)
Assessment: Stable CKD 3 likely due to longstanding HTN and diabetes.  No recent UA or baseline CKD screening labs as best I can tell.  She reports seeing Dr. Moshe Cipro several years ago with nephrology but has not been back to follow up recently.  Plan: - UA today with 2+ proteinuria.  Will attempt to quantify this at next follow up with [facility] - BMET today with stable CKD 3 - SPEP, IFE, and light chains ordered - CBC 1 month ago without anemia - referral placed to nephrology today for continued follow up

## 2017-07-16 NOTE — Assessment & Plan Note (Signed)
Takes Tramadol for right knee OA and reports good pain relief on once or twice daily dosing.   -refill Tramadol #120 for 2 months

## 2017-07-16 NOTE — Progress Notes (Signed)
Internal Medicine Clinic Attending  Case discussed with Dr. Wallace at the time of the visit.  We reviewed the resident's history and exam and pertinent patient test results.  I agree with the assessment, diagnosis, and plan of care documented in the resident's note.  

## 2017-07-16 NOTE — Assessment & Plan Note (Signed)
BP Readings from Last 3 Encounters:  07/15/17 (!) 185/97  06/03/17 (!) 157/95  02/18/17 138/81   Assessment: BP has deteriorated since August visit despite the addition of Spironolactone.  She reports adherence and brought her medication bottles in today, however, I am concerned for this.  HR today is 90 despite 25mg  BID dose of Coreg.  She denies any vision change, HA, CP.  Plan: - increase spironolactone to 50mg  daily - continue olmesartan-amlodipine-hctz 40-10-25mg  daily - continue coreg 25mg  BID - BMET today with stable electrolytes and CKD - RTC 2 months  - will reach out to pharmacy to assess adherence

## 2017-07-19 LAB — IMMUNOFIXATION ELECTROPHORESIS
IGA/IMMUNOGLOBULIN A, SERUM: 161 mg/dL (ref 87–352)
IGG (IMMUNOGLOBIN G), SERUM: 1160 mg/dL (ref 700–1600)
IgM (Immunoglobulin M), Srm: 51 mg/dL (ref 26–217)
Total Protein: 6.9 g/dL (ref 6.0–8.5)

## 2017-07-19 LAB — BMP8+ANION GAP
ANION GAP: 11 mmol/L (ref 10.0–18.0)
BUN/Creatinine Ratio: 10 — ABNORMAL LOW (ref 12–28)
BUN: 14 mg/dL (ref 8–27)
CALCIUM: 10.2 mg/dL (ref 8.7–10.3)
CO2: 27 mmol/L (ref 20–29)
CREATININE: 1.35 mg/dL — AB (ref 0.57–1.00)
Chloride: 105 mmol/L (ref 96–106)
GFR calc Af Amer: 49 mL/min/{1.73_m2} — ABNORMAL LOW (ref >59)
GFR, EST NON AFRICAN AMERICAN: 42 mL/min/{1.73_m2} — AB (ref >59)
Glucose: 102 mg/dL — ABNORMAL HIGH (ref 65–99)
POTASSIUM: 4.4 mmol/L (ref 3.5–5.2)
Sodium: 143 mmol/L (ref 134–144)

## 2017-07-19 LAB — PROTEIN ELECTROPHORESIS, SERUM, WITH REFLEX
A/G Ratio: 1 (ref 0.7–1.7)
ALBUMIN ELP: 3.5 g/dL (ref 2.9–4.4)
Alpha 1: 0.2 g/dL (ref 0.0–0.4)
Alpha 2: 0.8 g/dL (ref 0.4–1.0)
BETA: 1.1 g/dL (ref 0.7–1.3)
GAMMA GLOBULIN: 1.3 g/dL (ref 0.4–1.8)
Globulin, Total: 3.4 g/dL (ref 2.2–3.9)
Interpretation(See Below): 0

## 2017-07-19 LAB — KAPPA/LAMBDA LIGHT CHAINS
IG KAPPA FREE LIGHT CHAIN: 28.6 mg/L — AB (ref 3.3–19.4)
IG LAMBDA FREE LIGHT CHAIN: 19.7 mg/L (ref 5.7–26.3)
KAPPA/LAMBDA FLC RATIO: 1.45 (ref 0.26–1.65)

## 2017-07-19 LAB — IMMUNOFIXATION REFLEX, SERUM

## 2017-07-20 ENCOUNTER — Telehealth: Payer: Self-pay | Admitting: Pharmacist

## 2017-07-20 NOTE — Progress Notes (Signed)
Prescription history from [facility] pharmacist per Dr. Juleen China requestion on BP medications (reports consistent fill history on all meds, 90-day supplies of each): Carvedilol 05/26/17, olmesartan-amlodipine-HCTZ 05/25/17, spironolactone 07/03/17  Contacted patient to inquire whether she might need help with medications. She reports doing well and states she has no side effects since spironolactone was increased from 25 mg to 50 mg. I asked whether patient has BP monitor at home and she states she has one, but does not check BP. Advised patient to monitor and log home BP, and provided education. Patient did admit she gets "nervous at appointments since childhood", and agrees to track home BP to bring in for next office visit.

## 2017-07-28 ENCOUNTER — Encounter (HOSPITAL_COMMUNITY): Payer: Self-pay | Admitting: Psychology

## 2017-07-28 NOTE — Progress Notes (Signed)
Kristy [name] is a 61 y.o. female patient discharged from counseling as cancelled all f/u, not returning after intially appoitnment.   Outpatient Therapist Discharge Summary  SOUA LENK    April 12, 1956   Admission Date: 01/19/17   Discharge Date:  07/28/17 Reason for Discharge:  Pt didn't want to return Diagnosis:  MDD Comments:  Pt called front office 02/15/17 informing to cancel appointments as unable to afford.  Nelson Chimes .        Jan Fireman, LPC

## 2017-09-28 ENCOUNTER — Other Ambulatory Visit: Payer: Self-pay

## 2017-09-28 ENCOUNTER — Other Ambulatory Visit: Payer: Self-pay | Admitting: Internal Medicine

## 2017-09-28 DIAGNOSIS — E1149 Type 2 diabetes mellitus with other diabetic neurological complication: Secondary | ICD-10-CM

## 2017-09-28 MED ORDER — TRAMADOL HCL 50 MG PO TABS: ORAL_TABLET | ORAL | 1 refills | Status: DC

## 2017-09-28 MED ORDER — INSULIN GLARGINE 100 UNIT/ML ~~LOC~~ SOLN: SUBCUTANEOUS | 3 refills | Status: DC

## 2017-09-28 NOTE — Telephone Encounter (Signed)
traMADol (ULTRAM) 50 MG tablet   ,insulin glargine (LANTUS) 100 UNIT/ML injection, Refill request @ [facility] on [address]. Per patient the pharmacy is still waiting for the office to reply back.

## 2017-09-29 NOTE — Telephone Encounter (Signed)
Call from pt about Tramadol refill. Tramadol rx called to [place]; pt aware.

## 2017-10-07 ENCOUNTER — Ambulatory Visit (INDEPENDENT_AMBULATORY_CARE_PROVIDER_SITE_OTHER): Payer: Medicare Other | Admitting: Internal Medicine

## 2017-10-07 ENCOUNTER — Encounter: Payer: Self-pay | Admitting: Internal Medicine

## 2017-10-07 VITALS — BP 165/82 | HR 94 | Temp 97.9°F | Ht 62.0 in | Wt 219.1 lb

## 2017-10-07 DIAGNOSIS — I739 Peripheral vascular disease, unspecified: Secondary | ICD-10-CM | POA: Insufficient documentation

## 2017-10-07 DIAGNOSIS — E785 Hyperlipidemia, unspecified: Secondary | ICD-10-CM

## 2017-10-07 DIAGNOSIS — N183 Chronic kidney disease, stage 3 unspecified: Secondary | ICD-10-CM

## 2017-10-07 DIAGNOSIS — Z794 Long term (current) use of insulin: Secondary | ICD-10-CM | POA: Diagnosis not present

## 2017-10-07 DIAGNOSIS — G4733 Obstructive sleep apnea (adult) (pediatric): Secondary | ICD-10-CM | POA: Diagnosis not present

## 2017-10-07 DIAGNOSIS — I129 Hypertensive chronic kidney disease with stage 1 through stage 4 chronic kidney disease, or unspecified chronic kidney disease: Secondary | ICD-10-CM | POA: Diagnosis not present

## 2017-10-07 DIAGNOSIS — E1122 Type 2 diabetes mellitus with diabetic chronic kidney disease: Secondary | ICD-10-CM

## 2017-10-07 DIAGNOSIS — I10 Essential (primary) hypertension: Secondary | ICD-10-CM

## 2017-10-07 DIAGNOSIS — Z79899 Other long term (current) drug therapy: Secondary | ICD-10-CM

## 2017-10-07 DIAGNOSIS — Z8679 Personal history of other diseases of the circulatory system: Secondary | ICD-10-CM

## 2017-10-07 DIAGNOSIS — E1151 Type 2 diabetes mellitus with diabetic peripheral angiopathy without gangrene: Secondary | ICD-10-CM

## 2017-10-07 DIAGNOSIS — E1142 Type 2 diabetes mellitus with diabetic polyneuropathy: Secondary | ICD-10-CM | POA: Diagnosis not present

## 2017-10-07 LAB — POCT GLYCOSYLATED HEMOGLOBIN (HGB A1C): Hemoglobin A1C: 8.4

## 2017-10-07 LAB — GLUCOSE, CAPILLARY: GLUCOSE-CAPILLARY: 212 mg/dL — AB (ref 65–99)

## 2017-10-07 NOTE — Assessment & Plan Note (Addendum)
BP Readings from Last 3 Encounters:  10/07/17 (!) 165/82  07/15/17 (!) 185/97  06/03/17 (!) 157/95   Assessment: Her BP continues to be resistant to current medical therapy.  She has a hx of OSA with recommended CPAP use from prior notes but has not used one in several years.  She has a hx of RAS with angioplasty several years ago but last follow up with cardiology in 2012 was reassuring.  She does not use regular NSAIDs, she does not use tobacco products, labs do not suggest hyperaldosteronism, and she reports adherence to medications.  Last visit, due adherence was a concern and our pharmacist reached out to her pharmacy.  They confirmed regular prescriptions being filled every 90-days.  She reports that she monitors her BP at home about 1 time per week and has readings in the 957-473 systolic range.  She did not bring these readings to clinic, but she reports that her BP is always high when at the doctor office.  Additionally, her CKD 3 is likely playing a role at this point.  Plan: - we will continue with current regimen of spironolactone 50mg  daily, olmesartan-HCTZ-amlodipine 40-25-10mg  combination daily, and carvedilol 25mg  BID - in the future, could consider transition from HCTZ to chlorthalidone - will order sleep study in order to reassess her OSA and need for CPAP as a cause of her resistant HTN - pending sleep study evaluation, will consider referral back to cardiology for reassessment of her RAS.

## 2017-10-07 NOTE — Assessment & Plan Note (Signed)
Assessment: She has hx of OSA with need for CPAP in the past but has not been treated for several years.  I believe this could be contributing to her difficult to control HTN.  She reports frequent snoring.  Plan: - Order placed for sleep study today.

## 2017-10-07 NOTE — Progress Notes (Signed)
CC: here for f/u of her HTN and DM  HPI:  [name] is a 61 y.o. woman with a past medical history listed below here today for follow up of her HTN and DM.  For details of today's visit and the status of her chronic medical issues please refer to the assessment and plan.   Past Medical History:  Diagnosis Date  . Colon polyps   . Colon polyps   . Diabetes mellitus   . Diverticulosis   . Hyperlipidemia   . Hypertension   . 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 Albertine Patricia  . Sleep apnea 2007    status post polysomnogram 2007 , suggested use of CPAP  . Ventricular hypertrophy 2006    a 2-D echo in 2006, ejection fraction 55%, mild tricuspid regurgitation noted as well, 2-D echo November 13, 3352 showed diastolic dysfunction with LVEF normal of 65%   Review of Systems:  Please see pertinent ROS reviewed in HPI and problem based charting.   Physical Exam:  Vitals:   10/07/17 1320  BP: (!) 165/82  Pulse: 94  Temp: 97.9 F (36.6 C)  TempSrc: Oral  SpO2: 99%  Weight: 219 lb 1.6 oz (99.4 kg)  Height: 5\' 2"  (1.575 m)   Physical Exam  Constitutional: She is oriented to person, place, and time and well-developed, well-nourished, and in no distress.  HENT:  Head: Normocephalic and atraumatic.  Cardiovascular: Normal rate and regular rhythm.  Her peripheral pulses are diminished at the dorsalis pedis.  Pulmonary/Chest: Effort normal and breath sounds normal.  Musculoskeletal: Normal range of motion.  Neurological: She is alert and oriented to person, place, and time.  Skin: Skin is warm and dry.  Psychiatric: Mood and affect normal.     Assessment & Plan:   See Encounters Tab for problem based charting.  Patient discussed with Dr. Evette Doffing .  Essential  hypertension BP Readings from Last 3 Encounters:  10/07/17 (!) 165/82  07/15/17 (!) 185/97  06/03/17 (!) 157/95   Assessment: Her BP continues to be resistant to current medical therapy.  She has a hx of OSA with recommended CPAP use from prior notes but has not used one in several years.  She has a hx of RAS with angioplasty several years ago but last follow up with cardiology in 2012 was reassuring.  She does not use regular NSAIDs, she does not use tobacco products, labs do not suggest hyperaldosteronism, and she reports adherence to medications.  Last visit, due adherence was a concern and our pharmacist reached out to her pharmacy.  They confirmed regular prescriptions being filled every 90-days.  She reports that she monitors her BP at home about 1 time per week and has readings in the 562-563 systolic range.  She did not bring these readings to clinic, but she reports that her BP is always high when at the doctor office.  Additionally, her CKD 3 is likely playing a role at this point.  Plan: - we will continue with current regimen of spironolactone 50mg  daily, olmesartan-HCTZ-amlodipine 40-25-10mg  combination daily, and carvedilol 25mg  BID - in the future, could consider transition from HCTZ to chlorthalidone - will order sleep study in order to reassess her OSA and need for CPAP as a cause of her resistant HTN -  pending sleep study evaluation, will consider referral back to cardiology for reassessment of her RAS.   Sleep apnea Assessment: She has hx of OSA with need for CPAP in the past but has not been treated for several years.  I believe this could be contributing to her difficult to control HTN.  She reports frequent snoring.  Plan: - Order placed for sleep study today.  DM type 2 with diabetic peripheral neuropathy ([place]) Assessment: A1c today has gone from 7.4 to 8.4 since August 2018.  She reports adherence to her insulin regimen and metformin XR daily.  She does report dietary  indiscretion of frequent high carbohydrate intake.  She has not had any issues with hyper- or hypoglycemia.  Her meter today shows an average of 130 but only 18 tests since September.  She reports checking her sugars 1 time per day in the morning before breakfast.  Plan: - continue with Metformin XR 2000mg  daily - continue with Lantus 52 units QHS - recommended improve dietary choices regarding glycemic control - RTC 3 months for A1c, BMET  CKD (chronic kidney disease) stage 3, GFR 30-59 ml/min Assessment: She has stable CKD 3 from labs obtained in September this year.  Other work up for her CKD has been unrevealing thus far and I suspect this is due to her longstanding HTN and DM.  Last visit, her UA had 2+proteinuria.  Plan: - Urine protein to creatinine ratio was obtained today to quantify her proteinuria - She has follow up with nephrology scheduled for next month.  Claudication [facility]) Assessment: She described symptoms of claudication today with achy pain with exertion in her legs from knee down that improves with rest.  Her symptoms were not classically in her muscles as would be expected with claudication but with her history of HTN, DM, and HLD we elected to do ABI here in the clinic.  On exam, her pulses were palpable but felt diminished at the dorsalis pedis.  Plan: - ABI were normal with left 1.1 and right 1.1. - Suspect her pain in legs may be more arthritic and recommended she continue with her goals of diet and exercise.

## 2017-10-07 NOTE — Patient Instructions (Signed)
Thank you for coming to see me today. It was a pleasure. Today we talked about:   Blood pressure and sleep apnea: - we have ordered a sleep study for you to obtain a CPAP.  Keep taking your medications as prescribed for right now  Leg pains: - we are going to do a test in our clinic to make sure the blood flow in your legs is okay.  Diabetes: - Your A1c today is 8.4.  This is an increase from last time.  Continue with your current medications - Try to make sure and avoid excess sugary drinks and carbohydrates. - Continue to take your metformin and insulin as scheduled.  Kidney disease:  - follow up with the kidney doctors as scheduled.  Please follow-up with me in 3 months or so  If you have any questions or concerns, please do not hesitate to call the office at [phone].  Take Care,   Jule Ser, DO

## 2017-10-07 NOTE — Assessment & Plan Note (Signed)
Assessment: She has stable CKD 3 from labs obtained in September this year.  Other work up for her CKD has been unrevealing thus far and I suspect this is due to her longstanding HTN and DM.  Last visit, her UA had 2+proteinuria.  Plan: - Urine protein to creatinine ratio was obtained today to quantify her proteinuria - She has follow up with nephrology scheduled for next month.

## 2017-10-07 NOTE — Assessment & Plan Note (Signed)
Assessment: She described symptoms of claudication today with achy pain with exertion in her legs from knee down that improves with rest.  Her symptoms were not classically in her muscles as would be expected with claudication but with her history of HTN, DM, and HLD we elected to do ABI here in the clinic.  On exam, her pulses were palpable but felt diminished at the dorsalis pedis.  Plan: - ABI were normal with left 1.1 and right 1.1. - Suspect her pain in legs may be more arthritic and recommended she continue with her goals of diet and exercise.

## 2017-10-07 NOTE — Assessment & Plan Note (Signed)
Assessment: A1c today has gone from 7.4 to 8.4 since August 2018.  She reports adherence to her insulin regimen and metformin XR daily.  She does report dietary indiscretion of frequent high carbohydrate intake.  She has not had any issues with hyper- or hypoglycemia.  Her meter today shows an average of 130 but only 18 tests since September.  She reports checking her sugars 1 time per day in the morning before breakfast.  Plan: - continue with Metformin XR 2000mg  daily - continue with Lantus 52 units QHS - recommended improve dietary choices regarding glycemic control - RTC 3 months for A1c, BMET

## 2017-10-08 LAB — PROTEIN / CREATININE RATIO, URINE
Creatinine, Urine: 164.2 mg/dL
PROTEIN/CREAT RATIO: 495 mg/g{creat} — AB (ref 0–200)
Protein, Ur: 81.2 mg/dL

## 2017-10-08 NOTE — Progress Notes (Signed)
Internal Medicine Clinic Attending  Case discussed with Dr. Wallace at the time of the visit.  We reviewed the resident's history and exam and pertinent patient test results.  I agree with the assessment, diagnosis, and plan of care documented in the resident's note.  

## 2017-10-21 DIAGNOSIS — E1122 Type 2 diabetes mellitus with diabetic chronic kidney disease: Secondary | ICD-10-CM | POA: Diagnosis not present

## 2017-10-21 DIAGNOSIS — N183 Chronic kidney disease, stage 3 (moderate): Secondary | ICD-10-CM | POA: Diagnosis not present

## 2017-10-21 DIAGNOSIS — I701 Atherosclerosis of renal artery: Secondary | ICD-10-CM | POA: Diagnosis not present

## 2017-10-21 DIAGNOSIS — I129 Hypertensive chronic kidney disease with stage 1 through stage 4 chronic kidney disease, or unspecified chronic kidney disease: Secondary | ICD-10-CM | POA: Diagnosis not present

## 2017-11-01 ENCOUNTER — Other Ambulatory Visit: Payer: Self-pay

## 2017-11-01 NOTE — Telephone Encounter (Signed)
traMADol (ULTRAM) 50 MG tablet, refill request @ [facility] on [address].

## 2017-11-01 NOTE — Telephone Encounter (Signed)
Next appt scheduled  01/06/18 with PCP. 

## 2017-11-01 NOTE — Telephone Encounter (Signed)
[facility] - they forgot to add the 1 refill when it was called to them on 12/5. Stated they will get rx ready for pt.

## 2017-11-10 ENCOUNTER — Ambulatory Visit (HOSPITAL_BASED_OUTPATIENT_CLINIC_OR_DEPARTMENT_OTHER): Payer: Medicare Other | Attending: Oncology | Admitting: Internal Medicine

## 2017-11-10 VITALS — Ht 62.0 in | Wt 215.0 lb

## 2017-11-10 DIAGNOSIS — G4733 Obstructive sleep apnea (adult) (pediatric): Secondary | ICD-10-CM | POA: Insufficient documentation

## 2017-11-14 DIAGNOSIS — G4733 Obstructive sleep apnea (adult) (pediatric): Secondary | ICD-10-CM | POA: Diagnosis not present

## 2017-11-14 NOTE — Procedures (Signed)
    Patient Name: Kristy [name], [place] Date: 11/10/2017 Gender: Female D.O.B: 06/25/1956 Age (years): 61 Referring Provider: Murriel Hopper Height (inches): 32 Interpreting Physician: Baird Lyons MD, ABSM Weight (lbs): 215 RPSGT: Zadie Rhine BMI: 39 MRN: [id number] Neck Size: 15.50 <br> <br> CLINICAL INFORMATION Sleep Study Type: NPSG  Indication for sleep study: Snoring  Epworth Sleepiness Score: 4  SLEEP STUDY TECHNIQUE As per the AASM Manual for the Scoring of Sleep and Associated Events v2.3 (April 2016) with a hypopnea requiring 4% desaturations.  The channels recorded and monitored were frontal, central and occipital EEG, electrooculogram (EOG), submentalis EMG (chin), nasal and oral airflow, thoracic and abdominal wall motion, anterior tibialis EMG, snore microphone, electrocardiogram, and pulse oximetry.  MEDICATIONS Medications self-administered by patient taken the night of the study : none reported  SLEEP ARCHITECTURE The study was initiated at 10:43:46 PM and ended at 5:16:22 AM.  Sleep onset time was 51.9 minutes and the sleep efficiency was 83.8%. The total sleep time was 329.0 minutes.  Stage REM latency was 163.5 minutes.  The patient spent 3.34% of the night in stage N1 sleep, 79.48% in stage N2 sleep, 0.00% in stage N3 and 17.17% in REM.  Alpha intrusion was absent.  Supine sleep was 13.68%.  RESPIRATORY PARAMETERS The overall apnea/hypopnea index (AHI) was 31.7 per hour. There were 42 total apneas, including 42 obstructive, 0 central and 0 mixed apneas. There were 132 hypopneas and 37 RERAs.  The AHI during Stage REM sleep was 82.8 per hour.  AHI while supine was 92.0 per hour.  The mean oxygen saturation was 93.10%. The minimum SpO2 during sleep was 83.00%.  soft snoring was noted during this study.  CARDIAC DATA The 2 lead EKG demonstrated sinus rhythm. The mean heart rate was 83.70 beats per minute. Other EKG findings include:  PVCs.  LEG MOVEMENT DATA The total PLMS were 4 with a resulting PLMS index of 0.73. Associated arousal with leg movement index was 0.0 .  IMPRESSIONS - Moderate to severe obstructive sleep apnea occurred during this study (AHI = 31.7/h). - No significant central sleep apnea occurred during this study (CAI = 0.0/h). - Mild oxygen desaturation was noted during this study (Min O2 = 83.00%). - The patient snored with soft snoring volume. - EKG findings include PVCs. - Clinically significant periodic limb movements did not occur during sleep. No significant associated arousals.  DIAGNOSIS - Obstructive Sleep Apnea (327.23 [G47.33 ICD-10])  RECOMMENDATIONS - CPAP titration or AutoPAP is recommended. Other options would be based on clinical judgment. - Positional therapy avoiding supine position during sleep. - Be careful with alcohol, sedatives and other CNS depressants that may worsen sleep apnea and disrupt normal sleep architecture. - Sleep hygiene should be reviewed to assess factors that may improve sleep quality. - Weight management and regular exercise should be initiated or continued if appropriate.  [Electronically signed] 11/14/2017 01:47 PM  Baird Lyons MD, [place], American Board of Sleep Medicine   NPI: [id number]                         [place], [facility]  ELECTRONICALLY SIGNED ON:  11/14/2017, 1:45 PM [place] PH: [phone]   FX: [phone] [place]

## 2017-11-16 ENCOUNTER — Other Ambulatory Visit: Payer: Self-pay | Admitting: *Deleted

## 2017-11-16 ENCOUNTER — Other Ambulatory Visit: Payer: Self-pay | Admitting: Internal Medicine

## 2017-11-16 DIAGNOSIS — G4733 Obstructive sleep apnea (adult) (pediatric): Secondary | ICD-10-CM

## 2017-11-16 DIAGNOSIS — I10 Essential (primary) hypertension: Secondary | ICD-10-CM

## 2017-11-16 MED ORDER — PRAVASTATIN SODIUM 80 MG PO TABS: 80.0000 mg | ORAL_TABLET | Freq: Every day | ORAL | 3 refills | Status: DC

## 2017-11-17 ENCOUNTER — Telehealth: Payer: Self-pay | Admitting: Internal Medicine

## 2017-11-17 NOTE — Telephone Encounter (Signed)
Due to Medicare, the Extra Help program or a patient assistance would likely be best. I can reach out to patient to work on it.

## 2017-11-17 NOTE — Telephone Encounter (Signed)
Dr. Maudie Mercury, Do you think a copay card would help?

## 2017-11-17 NOTE — Telephone Encounter (Signed)
HER INSULIN IS 189.00, SHE CAN NOT AFFORD CAN WE CHANGE TO CHEAPER MEDICATION, Kristy [name] [phone]

## 2017-11-24 ENCOUNTER — Encounter: Payer: Self-pay | Admitting: Pharmacist

## 2017-11-24 ENCOUNTER — Telehealth: Payer: Self-pay | Admitting: Internal Medicine

## 2017-11-24 ENCOUNTER — Telehealth: Payer: Self-pay | Admitting: Pharmacist

## 2017-11-24 NOTE — Telephone Encounter (Signed)
Talked to Mannie Stabile - stated she called pt last week but will call her today.

## 2017-11-24 NOTE — Telephone Encounter (Signed)
Patient called on Wednesday about her insulin she haven't heard anything, she is calling back today, pt is unable to afford the insulin

## 2017-11-26 ENCOUNTER — Ambulatory Visit: Payer: Medicare Other | Admitting: Pharmacist

## 2017-11-26 VITALS — BP 161/96 | HR 90

## 2017-11-26 DIAGNOSIS — E1142 Type 2 diabetes mellitus with diabetic polyneuropathy: Secondary | ICD-10-CM

## 2017-11-26 DIAGNOSIS — E1342 Other specified diabetes mellitus with diabetic polyneuropathy: Secondary | ICD-10-CM

## 2017-11-26 DIAGNOSIS — I10 Essential (primary) hypertension: Secondary | ICD-10-CM

## 2017-11-26 DIAGNOSIS — F321 Major depressive disorder, single episode, moderate: Secondary | ICD-10-CM

## 2017-11-26 DIAGNOSIS — E785 Hyperlipidemia, unspecified: Secondary | ICD-10-CM

## 2017-11-26 MED ORDER — OLMESARTAN-AMLODIPINE-HCTZ 40-10-25 MG PO TABS: 1.0000 | ORAL_TABLET | Freq: Every day | ORAL | 3 refills | Status: DC

## 2017-11-26 MED ORDER — BUPROPION HCL ER (XL) 300 MG PO TB24: 300.0000 mg | ORAL_TABLET | Freq: Every day | ORAL | 0 refills | Status: DC

## 2017-11-26 MED ORDER — PREGABALIN 50 MG PO CAPS: 50.0000 mg | ORAL_CAPSULE | Freq: Three times a day (TID) | ORAL | 0 refills | Status: DC

## 2017-11-26 MED ORDER — ROSUVASTATIN CALCIUM 20 MG PO TABS: 20.0000 mg | ORAL_TABLET | Freq: Every day | ORAL | 3 refills | Status: DC

## 2017-11-26 MED ORDER — METFORMIN HCL ER 500 MG PO TB24: 500.0000 mg | ORAL_TABLET | Freq: Two times a day (BID) | ORAL | 3 refills | Status: DC

## 2017-11-26 MED ORDER — CARVEDILOL 25 MG PO TABS: 25.0000 mg | ORAL_TABLET | Freq: Two times a day (BID) | ORAL | 3 refills | Status: DC

## 2017-11-26 MED ORDER — SPIRONOLACTONE 50 MG PO TABS: 50.0000 mg | ORAL_TABLET | Freq: Every day | ORAL | 3 refills | Status: DC

## 2017-11-26 NOTE — Progress Notes (Signed)
Called patient to offer help with medications. Patient requested a visit---appointment scheduled.

## 2017-11-26 NOTE — Progress Notes (Signed)
lyruS: Kristy [name] is a 62 y.o. female reports to clinical pharmacist appointment for medication support. Patient did not bring medication bottles.  No Known Allergies Medication Sig  ACCU-CHEK FASTCLIX LANCETS MISC Check blood sugar two times a day  aspirin 81 MG EC tablet Take 81 mg by mouth daily.    Blood Glucose Monitoring Suppl (ACCU-CHEK GUIDE) w/Device KIT 1 each by Does not apply route 2 (two) times daily.  buPROPion (WELLBUTRIN XL) 300 MG 24 hr tablet Take 1 tablet (300 mg total) by mouth daily.  carvedilol (COREG) 25 MG tablet Take 1 tablet (25 mg total) by mouth 2 (two) times daily.  Cholecalciferol 1000 units capsule Take 1 capsule (1,000 Units total) by mouth daily.  glucose blood (ACCU-CHEK GUIDE) test strip Check blood sugar two times a day  insulin glargine (LANTUS) 100 UNIT/ML injection INJECT 52 UNITS INTO THE SKIN AT BEDTIME.  Insulin Pen Needle (B-D UF III MINI PEN NEEDLES) 31G X 5 MM MISC 1 Device by Does not apply route 3 (three) times daily before meals. Check daily 3X before meals  Insulin Syringe-Needle U-100 (INSULIN SYRINGE .5CC/31GX5/16") 31G X 5/16" 0.5 ML MISC Inject 1 Dose into the skin daily. The patient is insulin requiring, ICD 10 code E11.42. Insulin 1 time daily  metFORMIN (GLUCOPHAGE XR) 500 MG 24 hr tablet Take 1 tablet (500 mg total) by mouth 2 (two) times daily with a meal.  Olmesartan-Amlodipine-HCTZ 40-10-25 MG TABS Take 1 tablet by mouth daily.  rosuvastatin (CRESTOR) 20 MG tablet Take 1 tablet (20 mg total) by mouth daily.  spironolactone (ALDACTONE) 50 MG tablet Take 1 tablet (50 mg total) by mouth daily.  traMADol (ULTRAM) 50 MG tablet TAKE ONE TABLET BY MOUTH EVERY 6 HOURS AS NEEDED (MUST FILL 30 DAYS AFTER LAST REFILL).   Past Medical History:  Diagnosis Date  . Colon polyps   . Colon polyps   . Diabetes mellitus   . Diverticulosis   . Hyperlipidemia   . Hypertension   . 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 Albertine Patricia  . Sleep apnea 2007    status post polysomnogram 2007 , suggested use of CPAP  . Ventricular hypertrophy 2006    a 2-D echo in 2006, ejection fraction 55%, mild tricuspid regurgitation noted as well, 2-D echo November 13, 2776 showed diastolic dysfunction with LVEF normal of 65%   Social History   Socioeconomic History  . Marital status: Married    Spouse name: Not on file  . Number of children: Not on file  . Years of education: 72  . Highest education level: Not on file  Social Needs  . Financial resource strain: Not on file  . Food insecurity - worry: Not on file  . Food insecurity - inability: Not on file  . Transportation needs - medical: Not on file  . Transportation needs - non-medical: Not on file  Occupational History  . Not on file  Tobacco Use  . Smoking status: Former Smoker    Packs/day: 0.33    Years: 25.00    Pack years: 8.25    Last attempt to quit: 10/26/2005    Years since quitting: 12.0  . Smokeless tobacco: Never Used  Substance and Sexual Activity  . Alcohol use: No    Alcohol/week: 0.0  oz  . Drug use: No  . Sexual activity: Not Currently  Other Topics Concern  . Not on file  Social History Narrative   Lives with husband and two sons.    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   Completed to 11th grade.   Family History  Problem Relation Age of Onset  . Diabetes Mother   . Cancer Mother   . Diabetes Father   . Cancer Other        breast cancer died in her 31s  . Bipolar disorder Son   . Bipolar disorder Daughter   . Alcohol abuse Son   . Drug abuse Son   . Breast cancer Cousin   . Stroke Neg Hx    O: Component Value Date/Time   CHOL 161 06/03/2017 1322   HDL 44 06/03/2017 1322   TRIG 140 06/03/2017 1322   AST 33 06/09/2011  0937   ALT 45 (H) 06/09/2011 0937   NA 143 07/15/2017 1642   K 4.4 07/15/2017 1642   CL 105 07/15/2017 1642   CO2 27 07/15/2017 1642   GLUCOSE 102 (H) 07/15/2017 1642   GLUCOSE 117 (H) 01/10/2015 1534   HGBA1C 8.4 10/07/2017 1403   HGBA1C 6.6 09/30/2010 1444   BUN 14 07/15/2017 1642   CREATININE 1.35 (H) 07/15/2017 1642   CREATININE 1.11 (H) 01/10/2015 1534   CALCIUM 10.2 07/15/2017 1642   GFRNONAA 42 (L) 07/15/2017 1642   GFRNONAA 55 (L) 01/10/2015 1534   GFRAA 49 (L) 07/15/2017 1642   GFRAA 63 01/10/2015 1534   WBC 8.0 06/03/2017 1322   WBC 7.8 08/09/2014 1156   HGB 13.1 06/03/2017 1322   HCT 41.9 06/03/2017 1322   PLT 245 06/03/2017 1322   TSH 1.241 08/09/2014 1156   Ht Readings from Last 2 Encounters:  11/10/17 '5\' 2"'$  (1.575 m)  10/07/17 '5\' 2"'$  (1.575 m)   Wt Readings from Last 2 Encounters:  11/10/17 215 lb (97.5 kg)  10/07/17 219 lb 1.6 oz (99.4 kg)   There is no height or weight on file to calculate BMI. BP Readings from Last 3 Encounters:  11/26/17 (!) 161/96  10/07/17 (!) 165/82  07/15/17 (!) 185/97    A/P:  Patient having challenges meeting BP and A1C goals due to medication cost.  Referred patient to Medicare Extra Help program. Patient also requested generic prescriptions to be transferred to [place] for free generic program through [employer]. Prescriptions sent for generic medications.  Switched patient from pravastatin to rosuvastatin per Dr. Juleen China note from Jan 2017. At that time, patient was unable to afford high-intensity statin therapy (rosuvastatin is now $0 copay). 10-year CV risk score 27.1%; with optimal BP management: 15.8%. Other considerations: DM, claudication, renal artery stenosis.  Patient reports taking only 500 mg of metformin BID. She states she tried 100 mg BID but could not tolerate. Continue metformin 500 mg BID due to tolerability and renal function. Samples of Lantus provided to patient today.  Patient also  reports neuropathic pain and left shoulder pain not well controlled with tramadol. Will explore formulary options and discuss with PCP. She does have a PCP appointment scheduled on 01/06/18. Patient was advised to schedule an [facility] appointment if needing evaluation sooner. Recommended topical OTC pain agents in the meantime.  The patient verbalized understanding of information provided by repeating back concepts discussed.   30 minutes spent face-to-face with the patient during the encounter. 50% of time spent on  education. 50% of time was spent on assessment, plan, and coordination of care.

## 2017-12-02 ENCOUNTER — Other Ambulatory Visit: Payer: Self-pay | Admitting: Pharmacist

## 2017-12-02 ENCOUNTER — Ambulatory Visit: Payer: Medicare Other | Admitting: Pharmacist

## 2017-12-02 ENCOUNTER — Encounter: Payer: Self-pay | Admitting: Pharmacist

## 2017-12-02 DIAGNOSIS — E1142 Type 2 diabetes mellitus with diabetic polyneuropathy: Secondary | ICD-10-CM

## 2017-12-03 ENCOUNTER — Ambulatory Visit (HOSPITAL_BASED_OUTPATIENT_CLINIC_OR_DEPARTMENT_OTHER): Payer: Medicare Other | Attending: Internal Medicine | Admitting: Internal Medicine

## 2017-12-03 ENCOUNTER — Other Ambulatory Visit: Payer: Self-pay

## 2017-12-03 VITALS — Ht 62.0 in | Wt 215.0 lb

## 2017-12-03 DIAGNOSIS — G4733 Obstructive sleep apnea (adult) (pediatric): Secondary | ICD-10-CM

## 2017-12-03 MED ORDER — TRAMADOL HCL 50 MG PO TABS: ORAL_TABLET | ORAL | 1 refills | Status: DC

## 2017-12-03 NOTE — Telephone Encounter (Signed)
Needs to speak with a nurse about pain med. Please call back.  

## 2017-12-03 NOTE — Telephone Encounter (Signed)
Informed tramadol has been approved & sent to [facility].

## 2017-12-10 NOTE — Progress Notes (Signed)
error 

## 2017-12-11 DIAGNOSIS — G4733 Obstructive sleep apnea (adult) (pediatric): Secondary | ICD-10-CM

## 2017-12-11 NOTE — Procedures (Signed)
    Patient Name: Kristy [name], Kristy [name] Date: 12/03/2017 Gender: Female D.O.B: 05-Feb-1956 Age (years): 61 Referring Provider: Bartholomew Crews Height (inches): 36 Interpreting Physician: Baird Lyons MD, ABSM Weight (lbs): 215 RPSGT: Laren Everts BMI: 39 MRN: [id number] Neck Size: 15.50 <br> <br> CLINICAL INFORMATION The patient is referred for a CPAP titration to treat sleep apnea. Date of NPSG, Split Night or HST:   11/10/17   NPSG  AHI 37.7/ hr, desaturation to 83%, body weight 251 lbs  SLEEP STUDY TECHNIQUE As per the AASM Manual for the Scoring of Sleep and Associated Events v2.3 (April 2016) with a hypopnea requiring 4% desaturations.  The channels recorded and monitored were frontal, central and occipital EEG, electrooculogram (EOG), submentalis EMG (chin), nasal and oral airflow, thoracic and abdominal wall motion, anterior tibialis EMG, snore microphone, electrocardiogram, and pulse oximetry. Continuous positive airway pressure (CPAP) was initiated at the beginning of the study and titrated to treat sleep-disordered breathing.  MEDICATIONS Medications self-administered by patient taken the night of the study : none reported  TECHNICIAN COMMENTS Comments added by technician: NONE Comments added by scorer: N/A  RESPIRATORY PARAMETERS Optimal PAP Pressure (cm): 13 AHI at Optimal Pressure (/hr): 0.0 Overall Minimal O2 (%): 90.00 Supine % at Optimal Pressure (%): 11 Minimal O2 at Optimal Pressure (%): 92.0   SLEEP ARCHITECTURE The study was initiated at 10:46:47 PM and ended at 5:13:45 AM.  Sleep onset time was 37.6 minutes and the sleep efficiency was 77.7%. The total sleep time was 300.5 minutes.  The patient spent 5.32% of the night in stage N1 sleep, 74.54% in stage N2 sleep, 0.00% in stage N3 and 20.13% in REM.Stage REM latency was 109.0 minutes  Wake after sleep onset was 48.9. Alpha intrusion was absent. Supine sleep was 18.97%.  CARDIAC DATA The 2 lead EKG  demonstrated sinus rhythm. The mean heart rate was 94.31 beats per minute. Other EKG findings include: PVCs.  LEG MOVEMENT DATA The total Periodic Limb Movements of Sleep (PLMS) were 0. The PLMS index was 0.00. A PLMS index of <15 is considered normal in adults.  IMPRESSIONS - The optimal PAP pressure was 13 cm of water. - Central sleep apnea was not noted during this titration (CAI = 0.0/h). - Significant oxygen desaturations were not observed during this titration (min O2 = 90.00%). - The patient snored with moderate snoring volume during this titration study. - 2-lead EKG demonstrated: PVCs - Clinically significant periodic limb movements were not noted during this study. Arousals associated with PLMs were rare.  DIAGNOSIS - Obstructive Sleep Apnea (327.23 [G47.33 ICD-10])  RECOMMENDATIONS - Trial of CPAP therapy on 13 cm H2O.  Patient used a Small size Resmed Full Face Mask AirFit F20 mask and heated humidification. - Be careful with alcohol, sedatives and other CNS depressants that may worsen sleep apnea and disrupt normal sleep architecture. - Sleep hygiene should be reviewed to assess factors that may improve sleep quality. - Weight management and regular exercise should be initiated or continued.  [Electronically signed] 12/11/2017 03:00 PM  Baird Lyons MD, [place], American Board of Sleep Medicine   NPI: [id number]                         [place], [place] [facility]  ELECTRONICALLY SIGNED ON:  12/11/2017, 2:57 PM [place] PH: [phone]   FX: [phone] [place]

## 2017-12-14 ENCOUNTER — Other Ambulatory Visit: Payer: Self-pay | Admitting: Internal Medicine

## 2017-12-14 DIAGNOSIS — G4733 Obstructive sleep apnea (adult) (pediatric): Secondary | ICD-10-CM

## 2017-12-14 NOTE — Progress Notes (Signed)
OSA on problem list. Sending to PCP

## 2017-12-17 ENCOUNTER — Other Ambulatory Visit: Payer: Self-pay | Admitting: Pharmacist

## 2017-12-17 DIAGNOSIS — E1149 Type 2 diabetes mellitus with other diabetic neurological complication: Secondary | ICD-10-CM

## 2017-12-17 MED ORDER — INSULIN GLARGINE 100 UNIT/ML ~~LOC~~ SOLN: SUBCUTANEOUS | 3 refills | Status: DC

## 2017-12-17 NOTE — Progress Notes (Signed)
Price check for Lantus, still $180, will discuss with patient (possible deductible)

## 2018-01-06 ENCOUNTER — Encounter: Payer: Self-pay | Admitting: Internal Medicine

## 2018-01-06 ENCOUNTER — Ambulatory Visit (INDEPENDENT_AMBULATORY_CARE_PROVIDER_SITE_OTHER): Payer: Medicare Other | Admitting: Internal Medicine

## 2018-01-06 ENCOUNTER — Other Ambulatory Visit: Payer: Self-pay

## 2018-01-06 VITALS — BP 143/91 | HR 100 | Temp 97.0°F | Ht 62.0 in | Wt 213.3 lb

## 2018-01-06 DIAGNOSIS — Z794 Long term (current) use of insulin: Secondary | ICD-10-CM

## 2018-01-06 DIAGNOSIS — F321 Major depressive disorder, single episode, moderate: Secondary | ICD-10-CM

## 2018-01-06 DIAGNOSIS — G4733 Obstructive sleep apnea (adult) (pediatric): Secondary | ICD-10-CM

## 2018-01-06 DIAGNOSIS — E1142 Type 2 diabetes mellitus with diabetic polyneuropathy: Secondary | ICD-10-CM | POA: Diagnosis not present

## 2018-01-06 DIAGNOSIS — I10 Essential (primary) hypertension: Secondary | ICD-10-CM

## 2018-01-06 DIAGNOSIS — Z79899 Other long term (current) drug therapy: Secondary | ICD-10-CM | POA: Diagnosis not present

## 2018-01-06 LAB — GLUCOSE, CAPILLARY: GLUCOSE-CAPILLARY: 163 mg/dL — AB (ref 65–99)

## 2018-01-06 LAB — POCT GLYCOSYLATED HEMOGLOBIN (HGB A1C): Hemoglobin A1C: 7.8

## 2018-01-06 NOTE — Assessment & Plan Note (Signed)
Lab Results  Component Value Date   HGBA1C 7.8 01/06/2018     Her A1c has fluctuated over the last year or so.  In January 2018 it was 10.3 then improved to 8.4 shortly after.  In December 2018 it was 8.4.  She is on Lantus 52 units QHS and metformin.  She is only able to tolerate 500mg  BID due to GI side effects.  Plan: - Given her improvement from 8.4 to 7.8 will continue with her current regimen - RTC 3 months - foot exam done today

## 2018-01-06 NOTE — Patient Instructions (Signed)
Thank you for coming to see me today. It was a pleasure. Today we talked about:   Diabetes: your A1c has improved since December.  Keep up the good work and continue your current medications as prescribed for now.  Keep watching what you eat and exercise to help get this down closer to 7.  We are working on getting you a CPAP for your sleep apnea.  For Buproprion: please gradually taper the dose (eg, over 2 to 4 weeks) to allow for the detection of reemerging symptoms.  Take half your dose every day for 1 week, then half your dose every other day for 1 week, then every 3 days for 1 week, then stop  Please follow-up with me in 3 months.   If you have any questions or concerns, please do not hesitate to call the office at [phone].  Take Care,   Jule Ser, DO

## 2018-01-06 NOTE — Assessment & Plan Note (Signed)
Depression screen [facility] 2/9 01/06/2018 10/07/2017 07/15/2017  Decreased Interest 3 1 2   Down, Depressed, Hopeless 3 1 2   PHQ - 2 Score 6 2 4   Altered sleeping 3 0 3  Tired, decreased energy 3 0 0  Change in appetite 3 0 2  Feeling bad or failure about yourself  0 0 0  Trouble concentrating 0 0 1  Moving slowly or fidgety/restless 0 0 0  Suicidal thoughts 0 0 0  PHQ-9 Score 15 2 10   Difficult doing work/chores Not difficult at all Not difficult at all Not difficult at all  Some encounter information is confidential and restricted. Go to Review Flowsheets activity to see all data.  Some recent data might be hidden   Her PHQ is 15 today mostly centered around her sleeping trouble and she attributes this to her Wellbutrin and would like to come off this medication for the time being.  We discussed that Wellbutrin tends to be more activating but a trial off medication is reasonable.  Additionally, I hope her fatigue and disrupted sleep improves with treatment of her OSA.  Plan: - Trial off Wellbutrin.  She was given instructions on how to appropriately taper. - RTC 3 months.

## 2018-01-06 NOTE — Assessment & Plan Note (Signed)
BP Readings from Last 3 Encounters:  01/06/18 (!) 143/91  12/02/17 (!) 157/83  11/26/17 (!) 161/96   Her BP is better today.  She is on 5 agents that includes Coreg 25mg  BID, spironolactone 50mg  daily, olmesartan-amlodipine-HCTZ 40-10-25mg  combination.  She reports adherence although did not take her medications yet today.  We evaluated her OSA through a sleep study and she was recommended to be on CPAP.  We are currently working on arranging this.   Plan: - Continue with spironolactone 50mg  daily, olmesartan-HCTZ-amlodipine 40-25-10mg  combination daily, and carvedilol 25mg  BID - Follow up obtaining CPAP.  Hopefully treating her OSA will help improve her BP. - RTC 3 months.

## 2018-01-06 NOTE — Progress Notes (Signed)
CC: here for DM and HTN  HPI:  [name] is a 62 y.o. woman with a past medical history listed below here today for follow up of her DM and HTN.  For details of today's visit and the status of her chronic medical issues please refer to the assessment and plan.   Past Medical History:  Diagnosis Date  . Colon polyps   . Colon polyps   . Diabetes mellitus   . Diverticulosis   . Hyperlipidemia   . Hypertension   . 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 Albertine Patricia  . Sleep apnea 2007    status post polysomnogram 2007 , suggested use of CPAP  . Ventricular hypertrophy 2006    a 2-D echo in 2006, ejection fraction 55%, mild tricuspid regurgitation noted as well, 2-D echo November 13, 1608 showed diastolic dysfunction with LVEF normal of 65%   Review of Systems:  Review of Systems  Constitutional: Positive for malaise/fatigue. Negative for fever.  Respiratory: Negative for cough.   Cardiovascular: Negative for chest pain.  Musculoskeletal: Positive for joint pain.  Psychiatric/Behavioral: Positive for depression.    Physical Exam:  Vitals:   01/06/18 1322  BP: (!) 143/91  Pulse: 100  Temp: (!) 97 F (36.1 C)  TempSrc: Oral  SpO2: 100%  Weight: 213 lb 4.8 oz (96.8 kg)  Height: 5\' 2"  (9.604 m)   Physical Exam  Constitutional: She is oriented to person, place, and time and well-developed, well-nourished, and in no distress.  HENT:  Head: Normocephalic and atraumatic.  Cardiovascular: Normal rate and regular rhythm.  Pulmonary/Chest: Effort normal and breath sounds normal. She has no wheezes. She has no rales.  Neurological: She is alert and oriented to person, place, and time.  Skin: Skin is warm and dry.  Psychiatric: Mood and affect normal.     Assessment & Plan:   See Encounters Tab for problem based charting.  Patient discussed with Dr. Eppie Gibson.  Essential hypertension BP Readings from Last 3 Encounters:  01/06/18 (!) 143/91  12/02/17 (!) 157/83  11/26/17 (!) 161/96   Her BP is better today.  She is on 5 agents that includes Coreg 25mg  BID, spironolactone 50mg  daily, olmesartan-amlodipine-HCTZ 40-10-25mg  combination.  She reports adherence although did not take her medications yet today.  We evaluated her OSA through a sleep study and she was recommended to be on CPAP.  We are currently working on arranging this.   Plan: - Continue with spironolactone 50mg  daily, olmesartan-HCTZ-amlodipine 40-25-10mg  combination daily, and carvedilol 25mg  BID - Follow up obtaining CPAP.  Hopefully treating her OSA will help improve her BP. - RTC 3 months.   Obstructive sleep apnea She had her sleep study done last month and was recommended to be on CPAP to which she is agreeable to trying.  We have placed the order for this and are awaiting it to be completed.  Plan: - Follow up once she obtains her CPAP with improvement in her symptoms.   Moderate major depression ([place]) Depression screen [facility] 2/9 01/06/2018 10/07/2017 07/15/2017  Decreased Interest 3 1 2   Down, Depressed, Hopeless 3 1 2   PHQ - 2 Score 6 2 4   Altered sleeping 3 0 3  Tired, decreased energy 3 0 0  Change in appetite 3 0 2  Feeling bad or failure about yourself  0 0 0  Trouble concentrating 0 0 1  Moving slowly or fidgety/restless 0 0 0  Suicidal thoughts 0 0 0  PHQ-9 Score 15 2 10   Difficult doing work/chores Not difficult at all Not difficult at all Not difficult at all  Some encounter information is confidential and restricted. Go to Review Flowsheets activity to see all data.  Some recent data might be hidden   Her PHQ is 15 today mostly centered around her sleeping trouble and she attributes this to her Wellbutrin and would like to come off this medication  for the time being.  We discussed that Wellbutrin tends to be more activating but a trial off medication is reasonable.  Additionally, I hope her fatigue and disrupted sleep improves with treatment of her OSA.  Plan: - Trial off Wellbutrin.  She was given instructions on how to appropriately taper. - RTC 3 months.   DM type 2 with diabetic peripheral neuropathy [facility]) Lab Results  Component Value Date   HGBA1C 7.8 01/06/2018     Her A1c has fluctuated over the last year or so.  In January 2018 it was 10.3 then improved to 8.4 shortly after.  In December 2018 it was 8.4.  She is on Lantus 52 units QHS and metformin.  She is only able to tolerate 500mg  BID due to GI side effects.  Plan: - Given her improvement from 8.4 to 7.8 will continue with her current regimen - RTC 3 months - foot exam done today

## 2018-01-06 NOTE — Assessment & Plan Note (Signed)
She had her sleep study done last month and was recommended to be on CPAP to which she is agreeable to trying.  We have placed the order for this and are awaiting it to be completed.  Plan: - Follow up once she obtains her CPAP with improvement in her symptoms.

## 2018-01-10 ENCOUNTER — Other Ambulatory Visit: Payer: Self-pay | Admitting: Internal Medicine

## 2018-01-10 DIAGNOSIS — Z1231 Encounter for screening mammogram for malignant neoplasm of breast: Secondary | ICD-10-CM

## 2018-01-10 NOTE — Progress Notes (Signed)
Case discussed with Dr. Wallace soon after the resident saw the patient.  We reviewed the resident's history and exam and pertinent patient test results.  I agree with the assessment, diagnosis and plan of care documented in the resident's note. 

## 2018-01-12 DIAGNOSIS — H5310 Unspecified subjective visual disturbances: Secondary | ICD-10-CM | POA: Diagnosis not present

## 2018-01-17 DIAGNOSIS — G4733 Obstructive sleep apnea (adult) (pediatric): Secondary | ICD-10-CM | POA: Diagnosis not present

## 2018-01-20 ENCOUNTER — Other Ambulatory Visit: Payer: Self-pay | Admitting: Internal Medicine

## 2018-01-20 DIAGNOSIS — F321 Major depressive disorder, single episode, moderate: Secondary | ICD-10-CM

## 2018-01-28 ENCOUNTER — Ambulatory Visit
Admission: RE | Admit: 2018-01-28 | Discharge: 2018-01-28 | Disposition: A | Discharge status: Home / Self Care | Payer: Medicare Other | Source: Ambulatory Visit | Attending: Internal Medicine | Admitting: Internal Medicine

## 2018-01-28 DIAGNOSIS — Z1231 Encounter for screening mammogram for malignant neoplasm of breast: Secondary | ICD-10-CM | POA: Diagnosis not present

## 2018-01-31 ENCOUNTER — Other Ambulatory Visit: Payer: Self-pay

## 2018-01-31 DIAGNOSIS — E1142 Type 2 diabetes mellitus with diabetic polyneuropathy: Secondary | ICD-10-CM

## 2018-01-31 NOTE — Telephone Encounter (Signed)
traMADol (ULTRAM) 50 MG tablet  Refill request @ [facility] on [address]. Per patient the pharmacy is still waiting for the clinic to reply back.

## 2018-02-01 MED ORDER — TRAMADOL HCL 50 MG PO TABS
ORAL_TABLET | ORAL | 1 refills | Status: DC
Start: 2018-02-01 — End: 2018-03-23

## 2018-02-02 DIAGNOSIS — H524 Presbyopia: Secondary | ICD-10-CM | POA: Diagnosis not present

## 2018-02-02 LAB — HM DIABETES EYE EXAM

## 2018-02-04 ENCOUNTER — Other Ambulatory Visit: Payer: Self-pay | Admitting: Pharmacist

## 2018-02-04 DIAGNOSIS — E1142 Type 2 diabetes mellitus with diabetic polyneuropathy: Secondary | ICD-10-CM

## 2018-02-04 MED ORDER — INSULIN GLARGINE 100 UNIT/ML SOLOSTAR PEN: 52.0000 [IU] | PEN_INJECTOR | Freq: Every day | SUBCUTANEOUS | 11 refills | Status: DC

## 2018-02-07 ENCOUNTER — Encounter: Payer: Self-pay | Admitting: *Deleted

## 2018-02-16 DIAGNOSIS — Z01818 Encounter for other preprocedural examination: Secondary | ICD-10-CM | POA: Diagnosis not present

## 2018-02-16 DIAGNOSIS — H25812 Combined forms of age-related cataract, left eye: Secondary | ICD-10-CM | POA: Diagnosis not present

## 2018-02-16 DIAGNOSIS — H25811 Combined forms of age-related cataract, right eye: Secondary | ICD-10-CM | POA: Diagnosis not present

## 2018-02-16 DIAGNOSIS — E119 Type 2 diabetes mellitus without complications: Secondary | ICD-10-CM | POA: Diagnosis not present

## 2018-02-17 DIAGNOSIS — G4733 Obstructive sleep apnea (adult) (pediatric): Secondary | ICD-10-CM | POA: Diagnosis not present

## 2018-02-18 DIAGNOSIS — G4733 Obstructive sleep apnea (adult) (pediatric): Secondary | ICD-10-CM | POA: Diagnosis not present

## 2018-03-03 DIAGNOSIS — H2512 Age-related nuclear cataract, left eye: Secondary | ICD-10-CM | POA: Diagnosis not present

## 2018-03-03 DIAGNOSIS — H25812 Combined forms of age-related cataract, left eye: Secondary | ICD-10-CM | POA: Diagnosis not present

## 2018-03-14 ENCOUNTER — Telehealth: Payer: Self-pay | Admitting: *Deleted

## 2018-03-14 NOTE — Telephone Encounter (Addendum)
Received fax from [facility] with the following: "Please clarify atypical directions for metformin ER 500 mg. Typically this formulation is dosed once a day." Will route to Pharm D and PCP for review. Hubbard Hartshorn, RN, BSN

## 2018-03-16 NOTE — Telephone Encounter (Signed)
Thanks Dr. Kim!

## 2018-03-16 NOTE — Telephone Encounter (Signed)
I usually split the dose as BID due to pill size (fewer pills to take in 1 sitting) and to minimize GI intolerance, especially if they are already taking other BID medications such as carvedilol. I called [facility] to clarify. Thank you!

## 2018-03-19 DIAGNOSIS — G4733 Obstructive sleep apnea (adult) (pediatric): Secondary | ICD-10-CM | POA: Diagnosis not present

## 2018-03-23 ENCOUNTER — Other Ambulatory Visit: Payer: Self-pay | Admitting: Internal Medicine

## 2018-03-23 DIAGNOSIS — E1142 Type 2 diabetes mellitus with diabetic polyneuropathy: Secondary | ICD-10-CM

## 2018-03-23 NOTE — Telephone Encounter (Signed)
Last rx written 02/01/18. Last OV 01/06/18. Next OV /20/19.

## 2018-03-30 ENCOUNTER — Other Ambulatory Visit: Payer: Self-pay | Admitting: Pharmacist

## 2018-03-30 DIAGNOSIS — E1142 Type 2 diabetes mellitus with diabetic polyneuropathy: Secondary | ICD-10-CM

## 2018-03-30 NOTE — Telephone Encounter (Signed)
Hi Dr Maudie Mercury,  I refilled this prescription 5 days ago for Ms Delafuente and received a refill request today.  Do you know if there was an error in receiving the one 5 days ago?  Thanks!  Mitzi Hansen

## 2018-04-14 ENCOUNTER — Other Ambulatory Visit: Payer: Self-pay

## 2018-04-14 ENCOUNTER — Other Ambulatory Visit: Payer: Self-pay | Admitting: Pharmacist

## 2018-04-14 ENCOUNTER — Ambulatory Visit: Payer: Medicare Other | Admitting: Pharmacist

## 2018-04-14 ENCOUNTER — Encounter (INDEPENDENT_AMBULATORY_CARE_PROVIDER_SITE_OTHER): Payer: Self-pay

## 2018-04-14 ENCOUNTER — Ambulatory Visit (INDEPENDENT_AMBULATORY_CARE_PROVIDER_SITE_OTHER): Payer: Medicare Other | Admitting: Internal Medicine

## 2018-04-14 ENCOUNTER — Encounter: Payer: Self-pay | Admitting: Internal Medicine

## 2018-04-14 VITALS — BP 171/91 | HR 89 | Temp 98.1°F | Ht 62.0 in | Wt 212.4 lb

## 2018-04-14 DIAGNOSIS — E1142 Type 2 diabetes mellitus with diabetic polyneuropathy: Secondary | ICD-10-CM

## 2018-04-14 DIAGNOSIS — I10 Essential (primary) hypertension: Secondary | ICD-10-CM | POA: Diagnosis not present

## 2018-04-14 DIAGNOSIS — Z794 Long term (current) use of insulin: Secondary | ICD-10-CM | POA: Diagnosis not present

## 2018-04-14 DIAGNOSIS — Z79899 Other long term (current) drug therapy: Secondary | ICD-10-CM | POA: Diagnosis not present

## 2018-04-14 DIAGNOSIS — G4733 Obstructive sleep apnea (adult) (pediatric): Secondary | ICD-10-CM

## 2018-04-14 DIAGNOSIS — Z9119 Patient's noncompliance with other medical treatment and regimen: Secondary | ICD-10-CM

## 2018-04-14 LAB — GLUCOSE, CAPILLARY: Glucose-Capillary: 225 mg/dL — ABNORMAL HIGH (ref 65–99)

## 2018-04-14 LAB — POCT GLYCOSYLATED HEMOGLOBIN (HGB A1C): Hemoglobin A1C: 9.8 % — AB (ref 4.0–5.6)

## 2018-04-14 MED ORDER — ACCU-CHEK FASTCLIX LANCETS MISC: 5 refills | Status: DC

## 2018-04-14 MED ORDER — LIRAGLUTIDE 18 MG/3ML ~~LOC~~ SOPN: PEN_INJECTOR | SUBCUTANEOUS | 5 refills | Status: DC

## 2018-04-14 NOTE — Assessment & Plan Note (Signed)
BP Readings from Last 3 Encounters:  04/14/18 (!) 171/91  01/06/18 (!) 143/91  12/02/17 (!) 157/83   Her BP is uncontrolled today despite a 5 drug regimen suggestive of non-adherence.  She denies any NSAID or tobacco use.  Body mass index is 38.85 kg/m.  She has been evaluated for sleep apnea but has been non-adherent with her CPAP and has actually returned her machine to [place].  She does not check her BP at home and reports that she did not take her medication this morning as they make her feel drowsy.  Plan: - Continue spironolactone 50mg  daily, olmesartan-HCTZ-amlodipine 40-25-10mg  combination daily, and carvedilol 25mg  BID - Reinforced importance of adherence, diet, exercise, and weight loss. - RTC 3 months.  WIll need BMET at that time.

## 2018-04-14 NOTE — Assessment & Plan Note (Signed)
She has been non-adherent to her CPAP and returned the device recently to the company.  Her sleep report from May showed 53% usage and only 3 days of over 4 hours use.  She does however report feeling more rested and sleeping better since stopping her CPAP therapy.  Plan: - No further work up.  Can continue to reassess her willingness to try CPAP again with another type of mask potentially as I believe treating her OSA will help her hypertension as well

## 2018-04-14 NOTE — Progress Notes (Signed)
CC: here for f/u DM, OSA, and HTN  HPI:  [name] is a 62 y.o. woman with a past medical history listed below here today for follow up of her DM, OSA, and HTN.  For details of today's visit and the status of her chronic medical issues please refer to the assessment and plan.   Past Medical History:  Diagnosis Date  . Colon polyps   . Colon polyps   . Diabetes mellitus   . Diverticulosis   . Hyperlipidemia   . Hypertension   . 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 Albertine Patricia  . Sleep apnea 2007    status post polysomnogram 2007 , suggested use of CPAP  . Ventricular hypertrophy 2006    a 2-D echo in 2006, ejection fraction 55%, mild tricuspid regurgitation noted as well, 2-D echo November 13, 3298 showed diastolic dysfunction with LVEF normal of 65%   Review of Systems:  Please see pertinent ROS reviewed in HPI and problem based charting.   Physical Exam:  Vitals:   04/14/18 1318  BP: (!) 171/91  Pulse: 89  Temp: 98.1 F (36.7 C)  TempSrc: Oral  SpO2: 100%  Weight: 212 lb 6.4 oz (96.3 kg)  Height: 5\' 2"  (1.575 m)   Physical Exam  Constitutional: She is oriented to person, place, and time and well-developed, well-nourished, and in no distress.  HENT:  Head: Normocephalic and atraumatic.  Eyes: Conjunctivae and EOM are normal.  Neck: Normal range of motion.  Cardiovascular: Normal rate and regular rhythm.  Pulmonary/Chest: Effort normal and breath sounds normal. She has no wheezes. She has no rales.  Musculoskeletal: She exhibits no edema.  Neurological: She is alert and oriented to person, place, and time.  Skin: Skin is warm and dry.  Psychiatric: Mood and affect normal.    Assessment & Plan:   See Encounters Tab for problem based  charting.  Patient discussed with Dr. Beryle Beams .  Essential hypertension BP Readings from Last 3 Encounters:  04/14/18 (!) 171/91  01/06/18 (!) 143/91  12/02/17 (!) 157/83   Her BP is uncontrolled today despite a 5 drug regimen suggestive of non-adherence.  She denies any NSAID or tobacco use.  Body mass index is 38.85 kg/m.  She has been evaluated for sleep apnea but has been non-adherent with her CPAP and has actually returned her machine to [place].  She does not check her BP at home and reports that she did not take her medication this morning as they make her feel drowsy.  Plan: - Continue spironolactone 50mg  daily, olmesartan-HCTZ-amlodipine 40-25-10mg  combination daily, and carvedilol 25mg  BID - Reinforced importance of adherence, diet, exercise, and weight loss. - RTC 3 months.  WIll need BMET at that time.   Obstructive sleep apnea She has been non-adherent to her CPAP and returned the device recently to the company.  Her sleep report from May showed 53% usage and only 3 days of over 4 hours use.  She does however report feeling more rested and sleeping better since stopping her CPAP therapy.  Plan: - No further work up.  Can continue to reassess her willingness to try CPAP again with another type of mask potentially as I believe treating her OSA will help her hypertension as well  DM type  2 with diabetic peripheral neuropathy (HCC) A1c has deteriorated since March.  She reports adherence to Lantus 52 units QHS and metformin BID.  She did not bring her meter to clinic today and reports sometimes checking her sugars BID.  She reports that her average reading is "one something".  She reports that she is trying to work on exercise and decreasing her sugar intake from beverages and carbohydrates.  Plan: - Continue Lantus 52 units QHS - Continue Metformin BID - Add Victoza 0.6mg  daily for 1 week then increase to 1.2mg  daily thereafter - RTC 3 months

## 2018-04-14 NOTE — Progress Notes (Signed)
Medicine attending: Medical history, presenting problems, physical findings, and medications, reviewed with resident physician Dr Andrew Wallace on the day of the patient visit and I concur with his evaluation and management plan. 

## 2018-04-14 NOTE — Assessment & Plan Note (Signed)
A1c has deteriorated since March.  She reports adherence to Lantus 52 units QHS and metformin BID.  She did not bring her meter to clinic today and reports sometimes checking her sugars BID.  She reports that her average reading is "one something".  She reports that she is trying to work on exercise and decreasing her sugar intake from beverages and carbohydrates.  Plan: - Continue Lantus 52 units QHS - Continue Metformin BID - Add Victoza 0.6mg  daily for 1 week then increase to 1.2mg  daily thereafter - RTC 3 months

## 2018-04-14 NOTE — Patient Instructions (Addendum)
Thank you for coming to see me today. It was a pleasure. Today we talked about:   Blood pressure and diabetes.  Your diabetes has been less controlled in recent months and your A1c went from 7.8 to 9.8.  I would like to add a new medication to your regimen to take once daily to see if we can improve this.  It is called Victoza.  Your Blood pressure is high today as well.  Please continue taking your medications daily.  Please follow-up with [place] in 3 months.  If you have any questions or concerns, please do not hesitate to call the office at [phone].  Take Care,   Jule Ser, DO Liraglutide injection What is this medicine? LIRAGLUTIDE (LIR a GLOO tide) is used to improve blood sugar control in adults with type 2 diabetes. This medicine may be used with other diabetes medicines. This drug may also reduce the risk of heart attack or stroke if you have type 2 diabetes and risk factors for heart disease. This medicine may be used for other purposes; ask your health care provider or pharmacist if you have questions. COMMON BRAND NAME(S): Victoza What should I tell my health care provider before I take this medicine? They need to know if you have any of these conditions: -endocrine tumors (MEN 2) or if someone in your family had these tumors -gallbladder disease -high cholesterol -history of alcohol abuse problem -history of pancreatitis -kidney disease or if you are on dialysis -liver disease -previous swelling of the tongue, face, or lips with difficulty breathing, difficulty swallowing, hoarseness, or tightening of the throat -stomach problems -thyroid cancer or if someone in your family had thyroid cancer -an unusual or allergic reaction to liraglutide, other medicines, foods, dyes, or preservatives -pregnant or trying to get pregnant -breast-feeding How should I use this medicine? This medicine is for injection under the skin of your upper leg, stomach area, or upper arm.  You will be taught how to prepare and give this medicine. Use exactly as directed. Take your medicine at regular intervals. Do not take it more often than directed. It is important that you put your used needles and syringes in a special sharps container. Do not put them in a trash can. If you do not have a sharps container, call your pharmacist or healthcare provider to get one. A special MedGuide will be given to you by the pharmacist with each prescription and refill. Be sure to read this information carefully each time. Talk to your pediatrician regarding the use of this medicine in children. Special care may be needed. Overdosage: If you think you have taken too much of this medicine contact a poison control center or emergency room at once. NOTE: This medicine is only for you. Do not share this medicine with others. What if I miss a dose? If you miss a dose, take it as soon as you can. If it is almost time for your next dose, take only that dose. Do not take double or extra doses. What may interact with this medicine? -other medicines for diabetes Many medications may cause changes in blood sugar, these include: -alcohol containing beverages -antiviral medicines for HIV or AIDS -aspirin and aspirin-like drugs -certain medicines for blood pressure, heart disease, irregular heart beat -chromium -diuretics -female hormones, such as estrogens or progestins, birth control pills -fenofibrate -gemfibrozil -isoniazid -lanreotide -female hormones or anabolic steroids -MAOIs like Carbex, Eldepryl, Marplan, Nardil, and Parnate -medicines for weight loss -medicines for allergies, asthma,  cold, or cough -medicines for depression, anxiety, or psychotic disturbances -niacin -nicotine -NSAIDs, medicines for pain and inflammation, like ibuprofen or naproxen -octreotide -pasireotide -pentamidine -phenytoin -probenecid -quinolone antibiotics such as ciprofloxacin, levofloxacin, ofloxacin -some  herbal dietary supplements -steroid medicines such as prednisone or cortisone -sulfamethoxazole; trimethoprim -thyroid hormones Some medications can hide the warning symptoms of low blood sugar (hypoglycemia). You may need to monitor your blood sugar more closely if you are taking one of these medications. These include: -beta-blockers, often used for high blood pressure or heart problems (examples include atenolol, metoprolol, propranolol) -clonidine -guanethidine -reserpine This list may not describe all possible interactions. Give your health care provider a list of all the medicines, herbs, non-prescription drugs, or dietary supplements you use. Also tell them if you smoke, drink alcohol, or use illegal drugs. Some items may interact with your medicine. What should I watch for while using this medicine? Visit your doctor or health care professional for regular checks on your progress. Drink plenty of fluids while taking this medicine. Check with your doctor or health care professional if you get an attack of severe diarrhea, nausea, and vomiting. The loss of too much body fluid can make it dangerous for you to take this medicine. A test called the HbA1C (A1C) will be monitored. This is a simple blood test. It measures your blood sugar control over the last 2 to 3 months. You will receive this test every 3 to 6 months. Learn how to check your blood sugar. Learn the symptoms of low and high blood sugar and how to manage them. Always carry a quick-source of sugar with you in case you have symptoms of low blood sugar. Examples include hard sugar candy or glucose tablets. Make sure others know that you can choke if you eat or drink when you develop serious symptoms of low blood sugar, such as seizures or unconsciousness. They must get medical help at once. Tell your doctor or health care professional if you have high blood sugar. You might need to change the dose of your medicine. If you are sick or  exercising more than usual, you might need to change the dose of your medicine. Do not skip meals. Ask your doctor or health care professional if you should avoid alcohol. Many nonprescription cough and cold products contain sugar or alcohol. These can affect blood sugar. Pens should never be shared. Even if the needle is changed, sharing may result in passing of viruses like hepatitis or HIV. Wear a medical ID bracelet or chain, and carry a card that describes your disease and details of your medicine and dosage times. What side effects may I notice from receiving this medicine? Side effects that you should report to your doctor or health care professional as soon as possible: -allergic reactions like skin rash, itching or hives, swelling of the face, lips, or tongue -breathing problems -diarrhea that continues or is severe -lump or swelling on the neck -severe nausea -signs and symptoms of infection like fever or chills; cough; sore throat; pain or trouble passing urine -signs and symptoms of low blood sugar such as feeling anxious, confusion, dizziness, increased hunger, unusually weak or tired, sweating, shakiness, cold, irritable, headache, blurred vision, fast heartbeat, loss of consciousness -signs and symptoms of kidney injury like trouble passing urine or change in the amount of urine -trouble swallowing -unusual stomach upset or pain -vomiting Side effects that usually do not require medical attention (report to your doctor or health care professional if they continue  or are bothersome): -constipation -decreased appetite -diarrhea -fatigue -headache -nausea -pain, redness, or irritation at site where injected -stomach upset -stuffy or runny nose This list may not describe all possible side effects. Call your doctor for medical advice about side effects. You may report side effects to FDA at 1-800-FDA-1088. Where should I keep my medicine? Keep out of the reach of  children. Store unopened pen in a refrigerator between 2 and 8 degrees C (36 and 46 degrees F). Do not freeze or use if the medicine has been frozen. Protect from light and excessive heat. After you first use the pen, it can be stored at room temperature between 15 and 30 degrees C (59 and 86 degrees F) or in a refrigerator. Throw away your used pen after 30 days or after the expiration date, whichever comes first. Do not store your pen with the needle attached. If the needle is left on, medicine may leak from the pen. NOTE: This sheet is a summary. It may not cover all possible information. If you have questions about this medicine, talk to your doctor, pharmacist, or health care provider.  2018 Elsevier/Gold Standard (2016-10-29 14:39:40)

## 2018-04-14 NOTE — Progress Notes (Signed)
Patient was seen today in a co-visit between the physician and pharmacist.  See documentation under Dr. Alcario Drought visit for details.

## 2018-04-15 DIAGNOSIS — H43812 Vitreous degeneration, left eye: Secondary | ICD-10-CM | POA: Diagnosis not present

## 2018-04-15 DIAGNOSIS — H35412 Lattice degeneration of retina, left eye: Secondary | ICD-10-CM | POA: Diagnosis not present

## 2018-04-23 ENCOUNTER — Encounter: Payer: Self-pay | Admitting: *Deleted

## 2018-04-29 ENCOUNTER — Other Ambulatory Visit: Payer: Self-pay

## 2018-04-29 ENCOUNTER — Ambulatory Visit (INDEPENDENT_AMBULATORY_CARE_PROVIDER_SITE_OTHER): Payer: Medicare Other | Admitting: Internal Medicine

## 2018-04-29 VITALS — BP 154/95 | HR 102 | Temp 97.5°F | Ht 62.0 in | Wt 212.2 lb

## 2018-04-29 DIAGNOSIS — G4733 Obstructive sleep apnea (adult) (pediatric): Secondary | ICD-10-CM

## 2018-04-29 DIAGNOSIS — Z760 Encounter for issue of repeat prescription: Secondary | ICD-10-CM | POA: Diagnosis not present

## 2018-04-29 DIAGNOSIS — I701 Atherosclerosis of renal artery: Secondary | ICD-10-CM | POA: Diagnosis not present

## 2018-04-29 DIAGNOSIS — I150 Renovascular hypertension: Secondary | ICD-10-CM | POA: Diagnosis not present

## 2018-04-29 DIAGNOSIS — Z9119 Patient's noncompliance with other medical treatment and regimen: Secondary | ICD-10-CM

## 2018-04-29 DIAGNOSIS — Z79899 Other long term (current) drug therapy: Secondary | ICD-10-CM | POA: Diagnosis not present

## 2018-04-29 DIAGNOSIS — E119 Type 2 diabetes mellitus without complications: Secondary | ICD-10-CM

## 2018-04-29 DIAGNOSIS — I773 Arterial fibromuscular dysplasia: Secondary | ICD-10-CM | POA: Insufficient documentation

## 2018-04-29 DIAGNOSIS — F1190 Opioid use, unspecified, uncomplicated: Secondary | ICD-10-CM

## 2018-04-29 DIAGNOSIS — Z95820 Peripheral vascular angioplasty status with implants and grafts: Secondary | ICD-10-CM | POA: Diagnosis not present

## 2018-04-29 DIAGNOSIS — G8929 Other chronic pain: Secondary | ICD-10-CM | POA: Diagnosis not present

## 2018-04-29 DIAGNOSIS — Q798 Other congenital malformations of musculoskeletal system: Secondary | ICD-10-CM

## 2018-04-29 DIAGNOSIS — M1990 Unspecified osteoarthritis, unspecified site: Secondary | ICD-10-CM

## 2018-04-29 DIAGNOSIS — Z79891 Long term (current) use of opiate analgesic: Secondary | ICD-10-CM

## 2018-04-29 MED ORDER — TRAMADOL HCL 50 MG PO TABS: ORAL_TABLET | ORAL | 1 refills | Status: DC

## 2018-04-29 NOTE — Assessment & Plan Note (Signed)
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 She has been on this medication for many years and continues to report good pain relief. Reviewed [place] Controlled Substance Database which was appropriate; she refills this monthly and we are the sole prescriber.      Plan: Refill of Tramadol 50mg  #120 with 1 refill given today. I've asked her to RTC around the time she will need a repeat A1c. I see that her last pain contract on file seems to have been signed in 2014, she may benefit from implementing a new pain contract with her new PCP.

## 2018-04-29 NOTE — Progress Notes (Signed)
   CC: Medication refill  HPI:  [name] is a 62 y.o. F with medical history as outlined below who present today for Tramadol refill. She has no acute complaints.   For details regarding today's visit and the status of their chronic medical issues, please refer to the assessment and plan.  Past Medical History:  Diagnosis Date  . Colon polyps   . Colon polyps   . Diabetes mellitus   . Diverticulosis   . Hyperlipidemia   . Hypertension   . 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 Albertine Patricia  . Sleep apnea 2007    status post polysomnogram 2007 , suggested use of CPAP  . Ventricular hypertrophy 2006    a 2-D echo in 2006, ejection fraction 55%, mild tricuspid regurgitation noted as well, 2-D echo November 13, 2374 showed diastolic dysfunction with LVEF normal of 65%   Review of Systems:   General: Denies fevers, chills, weight loss, fatigue HEENT: Denies changes in vision, sore throat, dysphagia Cardiac: Denies CP, SOB, palpitations Pulmonary: Denies cough, wheezes Abd: Denies abdominal pain, changes in bowels Extremities: Admits to right knee pain. Denies weakness or swelling. Denied falls.  Physical Exam: General: Alert, in no acute distress. Pleasant and conversant HEENT: No icterus, injection or ptosis. No hoarseness or dysarthria  Cardiac: RRR, no murmur Pulmonary: CTA BL with normal WOB on RA. Able to speak in complete sentences Abd: Soft, non-tender. +bs Extremities: Warm, perfused. No significant pedal edema.   Vitals:   04/29/18 1459  BP: (!) 154/95  Pulse: (!) 102  Temp: (!) 97.5 F (36.4 C)  TempSrc: Oral  SpO2: 100%  Weight: 212 lb 3.2 oz (96.3 kg)  Height: 5\' 2"  (1.575 m)   Body mass index is 38.81 kg/m.  Assessment & Plan:   See  Encounters Tab for problem based charting.  Patient discussed with Dr. Beryle Beams

## 2018-04-29 NOTE — Assessment & Plan Note (Addendum)
Assessment: Prior PCP notes reviewed, patient with resistant hypertension felt to be related to medication and CPAP (OSA) non-adherance. She is prescribed an impressive drug regimen including Spironolactone 50mg , Olmesartan 40mg , HCTZ 25mg , Amlodipine 10mg  and Coreg 25mg  BID. BP is elevated today at 154/95 and pulse 102; she did not take her AM Coreg which could explain these findings.    Plan: Chart review shows patient with history of renal artery stenosis/fibromuscular dysplasia s/p stent 2007. This was patent in 2012. While medication and CPAP non-adherance could be responsible for her persistently elevated BPs; consider renal artery stenosis vs fibromuscular dysplasia as another possible etiology. Will defer management to her PCP.

## 2018-04-29 NOTE — Patient Instructions (Addendum)
It was great meeting you today! I'm glad you are doing well.   I'm glad the Tramadol is still working for your pain. I've sent in a refill to your pharmacy.   It will be important schedule an appointment with your new primary care physician, Dr. Donne Hazel, so you can discuss your chronic pain, diabetes and high blood pressure. Please try to schedule this within the next 1-2 months.

## 2018-05-02 NOTE — Progress Notes (Signed)
Medicine attending: Medical history, presenting problems, physical findings, and medications, reviewed with resident physician Dr Bethany Molt on the day of the patient visit and I concur with her evaluation and management plan. 

## 2018-05-06 DIAGNOSIS — H43812 Vitreous degeneration, left eye: Secondary | ICD-10-CM | POA: Diagnosis not present

## 2018-05-06 DIAGNOSIS — H35412 Lattice degeneration of retina, left eye: Secondary | ICD-10-CM | POA: Diagnosis not present

## 2018-05-12 DIAGNOSIS — H2511 Age-related nuclear cataract, right eye: Secondary | ICD-10-CM | POA: Diagnosis not present

## 2018-05-12 DIAGNOSIS — H25811 Combined forms of age-related cataract, right eye: Secondary | ICD-10-CM | POA: Diagnosis not present

## 2018-05-13 ENCOUNTER — Telehealth: Payer: Self-pay | Admitting: Internal Medicine

## 2018-05-30 ENCOUNTER — Other Ambulatory Visit: Payer: Self-pay | Admitting: Pharmacist

## 2018-05-30 DIAGNOSIS — E1142 Type 2 diabetes mellitus with diabetic polyneuropathy: Secondary | ICD-10-CM

## 2018-05-30 MED ORDER — LIRAGLUTIDE 18 MG/3ML ~~LOC~~ SOPN: PEN_INJECTOR | SUBCUTANEOUS | 5 refills | Status: DC

## 2018-05-30 NOTE — Progress Notes (Signed)
Patient requested transfer Victoza from [place] to [place].

## 2018-06-09 ENCOUNTER — Encounter: Payer: Self-pay | Admitting: Internal Medicine

## 2018-06-09 ENCOUNTER — Other Ambulatory Visit: Payer: Self-pay

## 2018-06-09 ENCOUNTER — Ambulatory Visit (INDEPENDENT_AMBULATORY_CARE_PROVIDER_SITE_OTHER): Payer: Medicare Other | Admitting: Internal Medicine

## 2018-06-09 VITALS — BP 146/100 | HR 110 | Temp 97.8°F | Ht 62.0 in | Wt 201.6 lb

## 2018-06-09 DIAGNOSIS — N183 Chronic kidney disease, stage 3 unspecified: Secondary | ICD-10-CM

## 2018-06-09 DIAGNOSIS — E1142 Type 2 diabetes mellitus with diabetic polyneuropathy: Secondary | ICD-10-CM

## 2018-06-09 DIAGNOSIS — I150 Renovascular hypertension: Secondary | ICD-10-CM

## 2018-06-09 DIAGNOSIS — E1122 Type 2 diabetes mellitus with diabetic chronic kidney disease: Secondary | ICD-10-CM | POA: Diagnosis not present

## 2018-06-09 DIAGNOSIS — Z794 Long term (current) use of insulin: Secondary | ICD-10-CM

## 2018-06-09 DIAGNOSIS — I129 Hypertensive chronic kidney disease with stage 1 through stage 4 chronic kidney disease, or unspecified chronic kidney disease: Secondary | ICD-10-CM | POA: Diagnosis not present

## 2018-06-09 DIAGNOSIS — Z79899 Other long term (current) drug therapy: Secondary | ICD-10-CM

## 2018-06-09 LAB — GLUCOSE, CAPILLARY
GLUCOSE-CAPILLARY: 169 mg/dL — AB (ref 70–99)
Glucose-Capillary: 186 mg/dL — ABNORMAL HIGH (ref 70–99)

## 2018-06-09 LAB — POCT GLYCOSYLATED HEMOGLOBIN (HGB A1C): Hemoglobin A1C: 8.5 % — AB (ref 4.0–5.6)

## 2018-06-09 NOTE — Patient Instructions (Addendum)
It was nice seeing you today. Thank you for choosing [facility] for your Primary Care.   Today we talked about:  1) Diabetes: I will call you with the results of you a1c 2) High blood pressure: If your kidney labs are okay today, I'd like to increase the dose of your Spironolactone from 50mg  to 100mg  daily   FOLLOW-UP INSTRUCTIONS When: 3 months For: diabetes, blood pressure What to bring: glucometer and all medications  Please contact the clinic if you have any problems, or need to be seen sooner.

## 2018-06-09 NOTE — Assessment & Plan Note (Addendum)
BP Readings from Last 3 Encounters:  06/09/18 (!) 146/100  04/29/18 (!) 154/95  04/14/18 (!) 171/91   Mildly improved today. Reports adherence with spironolactone, olmesartan, amlodipine, HCTZ, and coreg. Checks it at home with 140/90 readings. Denies headaches, vision changes, chest pain, sob.  Plan: - BMP today, if K+ is normal, will increase Spironolactone to 100mg  daily.  - Discussed diet and exercise

## 2018-06-09 NOTE — Progress Notes (Signed)
   CC: diabetes  HPI:  Kristy [name] is a 62 y.o. female with PMHx as listed below who presents for management of chronic conditions.  Please see encounter tab for full details of HPI  Past Medical History:  Diagnosis Date  . Colon polyps   . Colon polyps   . Diabetes mellitus   . Diverticulosis   . Hyperlipidemia   . Hypertension   . 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 Albertine Patricia  . Sleep apnea 2007    status post polysomnogram 2007 , suggested use of CPAP  . Ventricular hypertrophy 2006    a 2-D echo in 2006, ejection fraction 55%, mild tricuspid regurgitation noted as well, 2-D echo November 14, 5927 showed diastolic dysfunction with LVEF normal of 65%    Physical Exam:  Vitals:   06/09/18 1349  BP: (!) 146/100  Pulse: (!) 110  Temp: 97.8 F (36.6 C)  SpO2: 100%  Weight: 201 lb 9.6 oz (91.4 kg)  Height: 5\' 2"  (1.575 m)   Gen: Well appearing, NAD, overweight CV: RRR, no murmurs, no JVD Pulm: Normal effort, CTA throughout, no wheezing Abd: Soft, NT, ND, normal BS.  Ext: Warm, no edema, normal joints Skin:  No rashes   Assessment & Plan:   See Encounters Tab for problem based charting.  Patient seen with Dr. Dareen Piano

## 2018-06-09 NOTE — Assessment & Plan Note (Addendum)
Last a1c 9.8 two months ago. Glucometer download with 17 readings. Avg is 111, low 84 and high 163. Denies symptomatic hypoglycemia. Reports compliance with metformin 500mg  BID (not able to take 1,000mg  BID due to GI upset), Lantus 52U nightly, and Victoza 1.2mg .   Plan: - a1c today is 8.5. Mild improvement. Has some overlap with previous a1c 2 months ago.  - continue current regiment - f./u 3 months

## 2018-06-09 NOTE — Assessment & Plan Note (Addendum)
Follows at [facility] and reports being told her kidney function was normal but does not remember if that was this year or last year.   Plan: - BMP - urine microalb/creatine ratio - HTN and DM management - continue ARB  ADDENDUM: 06/13/18 at 4:12pm - repeat creatine is 2.9 (up from 1.3). Called patient. Instructed her to quit taking HCTZ and Olmesartan. Unfortunately, these two medicines are in a combo pill with amlodipine so she will have to stop taking amlodipine as well. Instructed her to schedule an appt in [facility] this week for repeat BMP and medication management.  - at next visit, please continue holding HCTZ and Olmesartan. You can prescribe a single agent Amlodipine and increase Spironolactone to 100mg   - microalbumin/creatine ratio also elevated. Holding ARB in the setting of worsening creatine

## 2018-06-10 LAB — BMP8+ANION GAP
Anion Gap: 20 mmol/L — ABNORMAL HIGH (ref 10.0–18.0)
BUN / CREAT RATIO: 13 (ref 12–28)
BUN: 39 mg/dL — ABNORMAL HIGH (ref 8–27)
CALCIUM: 10.9 mg/dL — AB (ref 8.7–10.3)
CHLORIDE: 99 mmol/L (ref 96–106)
CO2: 21 mmol/L (ref 20–29)
Creatinine, Ser: 2.94 mg/dL — ABNORMAL HIGH (ref 0.57–1.00)
GFR calc Af Amer: 19 mL/min/{1.73_m2} — ABNORMAL LOW (ref >59)
GFR, EST NON AFRICAN AMERICAN: 16 mL/min/{1.73_m2} — AB (ref >59)
GLUCOSE: 163 mg/dL — AB (ref 65–99)
POTASSIUM: 4.4 mmol/L (ref 3.5–5.2)
Sodium: 140 mmol/L (ref 134–144)

## 2018-06-10 LAB — MICROALBUMIN / CREATININE URINE RATIO
Creatinine, Urine: 305.3 mg/dL
MICROALB/CREAT RATIO: 179.9 mg/g{creat} — AB (ref 0.0–30.0)
Microalbumin, Urine: 549.1 ug/mL

## 2018-06-13 NOTE — Progress Notes (Signed)
Internal Medicine Clinic Attending  I saw and evaluated the patient.  I personally confirmed the key portions of the history and exam documented by Dr. Donne Hazel and I reviewed pertinent patient test results.  The assessment, diagnosis, and plan were formulated together and I agree with the documentation in the resident's note.  Patient noted to have worsening creatinine on BMP now up to 2.9. Would advise patient to hold ARB and HCTZ and follow up in [facility] this week for repeat blood work.

## 2018-06-15 ENCOUNTER — Other Ambulatory Visit: Payer: Self-pay

## 2018-06-15 ENCOUNTER — Ambulatory Visit (INDEPENDENT_AMBULATORY_CARE_PROVIDER_SITE_OTHER): Payer: Medicare Other | Admitting: Internal Medicine

## 2018-06-15 VITALS — BP 145/96 | HR 102 | Temp 98.0°F | Ht 62.0 in | Wt 203.2 lb

## 2018-06-15 DIAGNOSIS — N183 Chronic kidney disease, stage 3 unspecified: Secondary | ICD-10-CM

## 2018-06-15 DIAGNOSIS — E1142 Type 2 diabetes mellitus with diabetic polyneuropathy: Secondary | ICD-10-CM

## 2018-06-15 DIAGNOSIS — N2581 Secondary hyperparathyroidism of renal origin: Secondary | ICD-10-CM

## 2018-06-15 DIAGNOSIS — I150 Renovascular hypertension: Secondary | ICD-10-CM | POA: Diagnosis not present

## 2018-06-15 DIAGNOSIS — I129 Hypertensive chronic kidney disease with stage 1 through stage 4 chronic kidney disease, or unspecified chronic kidney disease: Secondary | ICD-10-CM

## 2018-06-15 DIAGNOSIS — E1122 Type 2 diabetes mellitus with diabetic chronic kidney disease: Secondary | ICD-10-CM

## 2018-06-15 DIAGNOSIS — Z79899 Other long term (current) drug therapy: Secondary | ICD-10-CM

## 2018-06-15 MED ORDER — VERAPAMIL HCL 80 MG PO TABS: 80.0000 mg | ORAL_TABLET | Freq: Three times a day (TID) | ORAL | 3 refills | Status: DC

## 2018-06-15 MED ORDER — VERAPAMIL HCL 80 MG PO TABS: 80.0000 mg | ORAL_TABLET | Freq: Three times a day (TID) | ORAL | 11 refills | Status: DC

## 2018-06-15 NOTE — Patient Instructions (Addendum)
Ms. Kohls,  It was a pleasure taking care of you today at the clinic.  Your blood pressure is still a little elevated and here are my recommendations after today's visit:  1. STOP olmesartan-amlodipine-hydrochlorothiazide 2. TAKE spironolactone 50 mg daily 3. TAKE Coreg 25 mg twice daily 4. TAKE verapamil 80 mg 3 times a day.  I am getting blood work to check for your kidneys.  I would like for you to continue checking your blood pressures and bring a log next week  I would like to see you back in 1 week for blood pressure check.  ~Dr. Eileen Stanford

## 2018-06-15 NOTE — Assessment & Plan Note (Addendum)
Renovascular hypertension: Her BP today is still elevated at 145/96 with pulse of 102.  Her last clinic visit on 8/15, her BP was 146/100.  She has since been told to hold olmesartan-amlodipine-HCTZ due to worsening renal function noted on BMP and currently only taking spironolactone 50 mg daily and Coreg 25 mg twice daily.   Plan  -Continue to hold olmesartan-amlodipine-HCTZ -Continue spironolactone 50 mg daily -Continue Coreg 25 mg twice daily -START verapamil 80 mg TID (for BP control and renal protection) -RTC in 1 week for BP check

## 2018-06-15 NOTE — Assessment & Plan Note (Addendum)
Chronic kidney disease: Previously found to have CKD stage III with progression into stage IV based on recent BMP with GFR of 16.  She was seen at the clinic last week and was found to have gradually increasing creatinine and elevated microalbumin/creatinine ratio.  She was asked to hold her combination antihypertensive (olmesartan-amlodipine-HCTZ) and only take spironolactone 50 mg daily.  CMP was ordered today and currently awaiting results.   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  If CMP shows elevated total protein and persistently elevated calcium, consideration should be given to work patient up for multiple myeloma and obtain PTH, UPEP, SPEP, kappa/lambda light chains and IFE.  In 2018 she had multiple myeloma work-up which was essentially negative but it is good to note that she did not have elevated calcium at that point.

## 2018-06-15 NOTE — Progress Notes (Signed)
   CC: Follow-up blood pressure, diabetes and medication management.  HPI:  [name] is a 62 y.o. with medical history as listed below presenting for management of chronic medical problems.  See problem based assessment below for further details.  Chronic kidney disease: Previously found to have CKD stage III with progression into stage IV based on recent BMP with GFR of 16.  She was seen at the clinic last week and was found to have gradually increasing creatinine and elevated microalbumin/creatinine ratio.  She was asked to hold her combination antihypertensive (olmesartan-amlodipine-HCTZ) and only take spironolactone 50 mg daily.  CMP was ordered today and currently awaiting results.   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  If CMP shows elevated total protein and persistently elevated calcium, consideration should be given to work patient up for multiple myeloma and obtain PTH, UPEP, SPEP, kappa/lambda light chains and IFE.  In 2018 she had multiple myeloma work-up which was essentially negative but it is good to note that she did not have elevated calcium at that point.  Renovascular hypertension: Her BP today is still elevated at 145/96 with pulse of 102.  Her last clinic visit on 8/15, her BP was 146/100.  She has since been told to hold olmesartan-amlodipine-HCTZ due to worsening renal function noted on BMP and currently only taking spironolactone 50 mg daily and Coreg 25 mg twice daily.   Plan  -Continue to hold olmesartan-amlodipine-HCTZ -Continue spironolactone 50 mg daily -Continue Coreg 25 mg twice daily -START verapamil 80 mg TID (for BP control and renal protection) -RTC in 1 week for BP check    Past Medical History:  Diagnosis Date  . Colon polyps     . Colon polyps   . Diabetes mellitus   . Diverticulosis   . Hyperlipidemia   . Hypertension   . 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 Albertine Patricia  . Sleep apnea 2007    status post polysomnogram 2007 , suggested use of CPAP  . Ventricular hypertrophy 2006    a 2-D echo in 2006, ejection fraction 55%, mild tricuspid regurgitation noted as well, 2-D echo November 13, 3333 showed diastolic dysfunction with LVEF normal of 65%   Review of Systems: As per HPI  Physical Exam:  Vitals:   06/15/18 1427  BP: (!) 145/96  Pulse: (!) 102  Temp: 98 F (36.7 C)  TempSrc: Oral  SpO2: 99%  Weight: 203 lb 3.2 oz (92.2 kg)  Height: '5\' 2"'$  (1.575 m)   Constitutional: In no acute distress Cardiovascular: Normal S1 and S2.  No murmurs, gallops, rubs Respiratory: Clear to auscultation bilaterally. Abdomen: Bowel sounds present, soft, nontender to palpation Extremity: No lower extremity edema.  Assessment & Plan:   See Encounters Tab for problem based charting.  Patient discussed with Dr. Daryll Drown

## 2018-06-16 LAB — CMP14 + ANION GAP
A/G RATIO: 1.6 (ref 1.2–2.2)
ALBUMIN: 4.2 g/dL (ref 3.6–4.8)
ALT: 16 IU/L (ref 0–32)
ANION GAP: 15 mmol/L (ref 10.0–18.0)
AST: 15 IU/L (ref 0–40)
Alkaline Phosphatase: 80 IU/L (ref 39–117)
BUN / CREAT RATIO: 11 — AB (ref 12–28)
BUN: 26 mg/dL (ref 8–27)
Bilirubin Total: 0.5 mg/dL (ref 0.0–1.2)
CALCIUM: 10.4 mg/dL — AB (ref 8.7–10.3)
CO2: 22 mmol/L (ref 20–29)
CREATININE: 2.27 mg/dL — AB (ref 0.57–1.00)
Chloride: 106 mmol/L (ref 96–106)
GFR, EST AFRICAN AMERICAN: 26 mL/min/{1.73_m2} — AB (ref >59)
GFR, EST NON AFRICAN AMERICAN: 22 mL/min/{1.73_m2} — AB (ref >59)
GLOBULIN, TOTAL: 2.6 g/dL (ref 1.5–4.5)
Glucose: 96 mg/dL (ref 65–99)
POTASSIUM: 4.5 mmol/L (ref 3.5–5.2)
SODIUM: 143 mmol/L (ref 134–144)
TOTAL PROTEIN: 6.8 g/dL (ref 6.0–8.5)

## 2018-06-19 ENCOUNTER — Encounter: Payer: Self-pay | Admitting: Internal Medicine

## 2018-06-19 NOTE — Progress Notes (Signed)
Internal Medicine Clinic Attending  I saw and evaluated the patient.  I personally confirmed the key portions of the history and exam documented by Dr. Agyei and I reviewed pertinent patient test results.  The assessment, diagnosis, and plan were formulated together and I agree with the documentation in the resident's note.  

## 2018-06-20 ENCOUNTER — Telehealth: Payer: Self-pay | Admitting: Internal Medicine

## 2018-06-20 ENCOUNTER — Encounter: Payer: Self-pay | Admitting: Internal Medicine

## 2018-06-20 NOTE — Telephone Encounter (Signed)
Please see letter encounter from today. Patient notified of results, to expect letter in the mail and will repeat lab at 06/24/2018 appt. Patient appreciative. Hubbard Hartshorn, RN, BSN

## 2018-06-20 NOTE — Telephone Encounter (Signed)
Pt missed call, pls contact back [phone]

## 2018-06-20 NOTE — Progress Notes (Signed)
I called patient to discuss CMP but was unable to reach her.  Overall her CKD 4 is stable and calcium still minimally elevated at 10.4 (upper limit of normal is 10.3).

## 2018-06-24 ENCOUNTER — Other Ambulatory Visit: Payer: Self-pay

## 2018-06-24 ENCOUNTER — Ambulatory Visit (INDEPENDENT_AMBULATORY_CARE_PROVIDER_SITE_OTHER): Payer: Medicare Other | Admitting: Internal Medicine

## 2018-06-24 ENCOUNTER — Encounter: Payer: Self-pay | Admitting: Internal Medicine

## 2018-06-24 VITALS — BP 139/75 | HR 89 | Temp 97.8°F | Ht 62.0 in | Wt 203.2 lb

## 2018-06-24 DIAGNOSIS — N183 Chronic kidney disease, stage 3 unspecified: Secondary | ICD-10-CM

## 2018-06-24 DIAGNOSIS — I129 Hypertensive chronic kidney disease with stage 1 through stage 4 chronic kidney disease, or unspecified chronic kidney disease: Secondary | ICD-10-CM

## 2018-06-24 DIAGNOSIS — Z79899 Other long term (current) drug therapy: Secondary | ICD-10-CM | POA: Diagnosis not present

## 2018-06-24 DIAGNOSIS — I150 Renovascular hypertension: Secondary | ICD-10-CM

## 2018-06-24 NOTE — Progress Notes (Signed)
   CC: Follow up CKD, Renovascular HTN   HPI:  [name] is a 62 y.o. with medical history as listed below presenting for follow-up of chronic medical problems.  See problem based assessment of the details.  Chronic kidney disease: This problem is chronic.  She was seen at the clinic on 8/21 and noted to have worsening renal function with medical albuminemia.  She was started on verapamil 80 mg TID for renal protection and BP control and asked to discontinue previous olmesartan-amlodipine-HCTZ.  BMP clinic today showed improved renal function.  She has been established care with [facility] but has not followed up with them.  Plan: - Follow-up with [facility]  Renovascular hypertension: BP much better clinic today with measurement 139/75.  Current antihypertensives include spironolactone 50 mg daily, Coreg 25 mg twice daily and verapamil 80 mg TID.  BP today is the best it has been in a long time.  I would not make any adjustment to her BP meds at this time even though there is concern of spironolactone and her chronic kidney disease with GFR less than 30.  She has normal potassium and will continue her current regimen.  Plan: -Continue spironolactone 50 mg daily, Coreg 25 mg twice daily, verapamil 80 mg TID  Past Medical History:  Diagnosis Date  . Colon polyps   . Colon polyps   . Diabetes mellitus   . Diverticulosis   . Hyperlipidemia   . Hypertension   . 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 Albertine Patricia  . Sleep apnea 2007    status post polysomnogram 2007 , suggested use of CPAP  . Ventricular hypertrophy 2006    a 2-D echo in 2006, ejection fraction 55%, mild tricuspid regurgitation noted as well, 2-D echo November 14, 4399 showed diastolic dysfunction  with LVEF normal of 65%   Review of Systems:  As per HPI  Physical Exam:  Vitals:   06/24/18 1315  BP: 139/75  Pulse: 89  Temp: 97.8 F (36.6 C)  TempSrc: Oral  SpO2: 99%  Weight: 203 lb 3.2 oz (92.2 kg)  Height: 5\' 2"  (1.575 m)   Physical Exam  Constitutional: She is well-developed, well-nourished, and in no distress.  Cardiovascular: Normal rate, regular rhythm and normal heart sounds. Exam reveals no gallop and no friction rub.  No murmur heard. Pulmonary/Chest: Effort normal and breath sounds normal. No respiratory distress. She has no wheezes. She has no rales.  Abdominal: Soft. Bowel sounds are normal. She exhibits no distension. There is no tenderness.  Neurological: She is alert.  Extremity: Trace bilateral pitting edema   Assessment & Plan:   See Encounters Tab for problem based charting.  Patient discussed with Dr. Rebeca Alert

## 2018-06-24 NOTE — Patient Instructions (Signed)
Ms. Dimino,   It was a pleasure taking care of you today and based on your last lab work, your kidneys are doing a little better and your blood pressure is controlled today.   Here are my recommendations after today's visit: 1. Please make an appointment with [facility] for follow up  2. I am getting blood work today 3. We will see you back at the clinic in 1-2 months.  4. Continue all your medications as directed   ~Dr. Eileen Stanford

## 2018-06-25 ENCOUNTER — Encounter: Payer: Self-pay | Admitting: Internal Medicine

## 2018-06-25 LAB — BMP8+ANION GAP
Anion Gap: 14 mmol/L (ref 10.0–18.0)
BUN/Creatinine Ratio: 11 — ABNORMAL LOW (ref 12–28)
BUN: 22 mg/dL (ref 8–27)
CO2: 21 mmol/L (ref 20–29)
Calcium: 10 mg/dL (ref 8.7–10.3)
Chloride: 105 mmol/L (ref 96–106)
Creatinine, Ser: 2.04 mg/dL — ABNORMAL HIGH (ref 0.57–1.00)
GFR calc Af Amer: 29 mL/min/{1.73_m2} — ABNORMAL LOW (ref >59)
GFR calc non Af Amer: 26 mL/min/{1.73_m2} — ABNORMAL LOW (ref >59)
Glucose: 115 mg/dL — ABNORMAL HIGH (ref 65–99)
Potassium: 4.4 mmol/L (ref 3.5–5.2)
Sodium: 140 mmol/L (ref 134–144)

## 2018-06-25 NOTE — Assessment & Plan Note (Signed)
BP much better clinic today with measurement 139/75.  Current antihypertensives include spironolactone 50 mg daily, Coreg 25 mg twice daily and verapamil 80 mg TID.  BP today is the best it has been in a long time.  I would not make any adjustment to her BP meds at this time even though there is concern of spironolactone and her chronic kidney disease with GFR less than 30.  She has normal potassium and will continue her current regimen.  Plan: -Continue spironolactone 50 mg daily, Coreg 25 mg twice daily, verapamil 80 mg TID

## 2018-06-25 NOTE — Assessment & Plan Note (Signed)
This problem is chronic.  She was seen at the clinic on 8/21 and noted to have worsening renal function with medical albuminemia.  She was started on verapamil 80 mg TID for renal protection and BP control and asked to discontinue previous olmesartan-amlodipine-HCTZ.  BMP clinic today showed improved renal function.  She has been established care with [facility] but has not followed up with them.  Plan: - Follow-up with [facility]

## 2018-06-26 NOTE — Progress Notes (Signed)
Internal Medicine Clinic Attending  I saw and evaluated the patient.  I personally confirmed the key portions of the history and exam documented by Dr. Eileen Stanford and I reviewed pertinent patient test results.  The assessment, diagnosis, and plan were formulated together and I agree with the documentation in the resident's note.  Lenice Pressman, M.D., Ph.D.

## 2018-06-27 ENCOUNTER — Other Ambulatory Visit: Payer: Self-pay | Admitting: Internal Medicine

## 2018-06-27 ENCOUNTER — Telehealth: Payer: Self-pay | Admitting: Internal Medicine

## 2018-06-27 DIAGNOSIS — F1190 Opioid use, unspecified, uncomplicated: Secondary | ICD-10-CM

## 2018-06-27 NOTE — Telephone Encounter (Signed)
Called patient to discuss lab results. BMP shows improving renal function as noted with decrease in creatinine. She has follow up appointment with [facility] and will call for appointment

## 2018-06-28 NOTE — Telephone Encounter (Signed)
Next appt scheduled 10/3 with PCP. 

## 2018-06-30 ENCOUNTER — Telehealth: Payer: Self-pay | Admitting: Internal Medicine

## 2018-06-30 NOTE — Telephone Encounter (Signed)
Print option selected on tramadol Rx. Called to Pharmacist at [facility]. Hubbard Hartshorn, RN, BSN

## 2018-07-05 ENCOUNTER — Telehealth: Payer: Self-pay | Admitting: Internal Medicine

## 2018-07-05 NOTE — Telephone Encounter (Signed)
Called pt on her cell phone - informed of Dr Cristal Generous response for decreasing Tramadol to q6hr d/t to worsening kidney fcn Stated she understands but no one had called her. Told her the doctor had tried; stated if she's not at home, call her cell. Lab appt scheduled for tomorrow @ 1130 AM.

## 2018-07-05 NOTE — Telephone Encounter (Signed)
Please schedule patient for lab only visit to recheck kidney function. I reduced her tramadol frequency from q6h to q12h because her kidney function had worsened. The last labs we got showed improvement of creatine but it's still high. I'd like to make sure her creatine is continuing to improve before I increase the frequency of her tramadol back to q6h. She can supplement with Tylenol if she needs extra pain relief. I tried calling her  but no one answered and I didn't get a voicemail.   Thanks,   Dr. Donne Hazel

## 2018-07-05 NOTE — Telephone Encounter (Signed)
Returned call to patient. She states new Rx for tramadol is for #60 1 tab every 12 hours. She states she cannot go that long. She states she has been receiving 120 tabs and taking 1 tab every 6 hours for a long time. Requesting to return to this. Hubbard Hartshorn, RN, BSN

## 2018-07-05 NOTE — Telephone Encounter (Signed)
Pt would like to speak to a nurse regarding pain medicine was reduced; pt contact# [phone]

## 2018-07-06 ENCOUNTER — Telehealth: Payer: Self-pay | Admitting: *Deleted

## 2018-07-06 ENCOUNTER — Other Ambulatory Visit (INDEPENDENT_AMBULATORY_CARE_PROVIDER_SITE_OTHER): Payer: Medicare Other

## 2018-07-06 ENCOUNTER — Other Ambulatory Visit: Payer: Self-pay | Admitting: Internal Medicine

## 2018-07-06 DIAGNOSIS — N183 Chronic kidney disease, stage 3 unspecified: Secondary | ICD-10-CM

## 2018-07-06 NOTE — Telephone Encounter (Signed)
Pt had lab work done ; requesting copy to be faxed to [facility], Dr Moshe Cipro.

## 2018-07-07 LAB — BMP8+ANION GAP
ANION GAP: 12 mmol/L (ref 10.0–18.0)
BUN/Creatinine Ratio: 10 — ABNORMAL LOW (ref 12–28)
BUN: 17 mg/dL (ref 8–27)
CALCIUM: 10.2 mg/dL (ref 8.7–10.3)
CO2: 24 mmol/L (ref 20–29)
CREATININE: 1.74 mg/dL — AB (ref 0.57–1.00)
Chloride: 105 mmol/L (ref 96–106)
GFR calc Af Amer: 36 mL/min/{1.73_m2} — ABNORMAL LOW (ref >59)
GFR, EST NON AFRICAN AMERICAN: 31 mL/min/{1.73_m2} — AB (ref >59)
Glucose: 81 mg/dL (ref 65–99)
POTASSIUM: 4.3 mmol/L (ref 3.5–5.2)
SODIUM: 141 mmol/L (ref 134–144)

## 2018-07-07 NOTE — Telephone Encounter (Signed)
Labs have been faxed to [facility] this morning.

## 2018-07-08 ENCOUNTER — Other Ambulatory Visit: Payer: Self-pay | Admitting: Internal Medicine

## 2018-07-08 DIAGNOSIS — F1190 Opioid use, unspecified, uncomplicated: Secondary | ICD-10-CM

## 2018-07-08 MED ORDER — TRAMADOL HCL 50 MG PO TABS: 50.0000 mg | ORAL_TABLET | Freq: Four times a day (QID) | ORAL | 0 refills | Status: DC | PRN

## 2018-07-12 ENCOUNTER — Other Ambulatory Visit: Payer: Self-pay | Admitting: Internal Medicine

## 2018-07-12 DIAGNOSIS — F1190 Opioid use, unspecified, uncomplicated: Secondary | ICD-10-CM

## 2018-07-15 ENCOUNTER — Other Ambulatory Visit: Payer: Self-pay | Admitting: Internal Medicine

## 2018-07-15 DIAGNOSIS — F1190 Opioid use, unspecified, uncomplicated: Secondary | ICD-10-CM

## 2018-07-15 MED ORDER — TRAMADOL HCL 50 MG PO TABS: 50.0000 mg | ORAL_TABLET | Freq: Two times a day (BID) | ORAL | 0 refills | Status: DC | PRN

## 2018-07-15 NOTE — Telephone Encounter (Signed)
Refill Request- Patient states she received 60 pills and was supposed to be 120 pills.   traMADol (ULTRAM) 50 MG tablet [facility] [place], [place] - [address]

## 2018-07-15 NOTE — Telephone Encounter (Signed)
Rx was refilled on 9/13 but "Print"; was not sent electronically.  I called [facility] - stated she picked up qty #60 on 9/6.

## 2018-07-18 NOTE — Telephone Encounter (Signed)
Her creatinine is still elevated above baseline. She was instructed to take her tramadol only q 12hours and she stated that she understood this to [employer]. I think she needs to be seen in [facility] prior to further refills.

## 2018-07-18 NOTE — Telephone Encounter (Signed)
Thank you :)

## 2018-07-18 NOTE — Telephone Encounter (Signed)
Patient called in. States she has continued to take tramadol q 6 hours due to severe pain. Pharmacy will not give her Rx sent 07/15/2018 as she shouldn't be out of them if she was taking as directed (q12h). She is completely out of tramadol. BMP results in Epic. Hubbard Hartshorn, RN, BSN

## 2018-07-18 NOTE — Telephone Encounter (Addendum)
No [facility] appts today. Given appt tomorrow at 0945. Hubbard Hartshorn, RN, BSN

## 2018-07-19 ENCOUNTER — Other Ambulatory Visit: Payer: Self-pay

## 2018-07-19 ENCOUNTER — Ambulatory Visit (INDEPENDENT_AMBULATORY_CARE_PROVIDER_SITE_OTHER): Payer: Medicare Other | Admitting: Internal Medicine

## 2018-07-19 VITALS — BP 168/104 | HR 87 | Temp 98.0°F | Ht 62.0 in | Wt 202.2 lb

## 2018-07-19 DIAGNOSIS — Z79891 Long term (current) use of opiate analgesic: Secondary | ICD-10-CM | POA: Diagnosis not present

## 2018-07-19 DIAGNOSIS — Z23 Encounter for immunization: Secondary | ICD-10-CM

## 2018-07-19 DIAGNOSIS — M25561 Pain in right knee: Secondary | ICD-10-CM | POA: Diagnosis not present

## 2018-07-19 DIAGNOSIS — G8929 Other chronic pain: Secondary | ICD-10-CM | POA: Diagnosis not present

## 2018-07-19 DIAGNOSIS — M170 Bilateral primary osteoarthritis of knee: Secondary | ICD-10-CM | POA: Diagnosis not present

## 2018-07-19 DIAGNOSIS — N183 Chronic kidney disease, stage 3 unspecified: Secondary | ICD-10-CM

## 2018-07-19 DIAGNOSIS — F1190 Opioid use, unspecified, uncomplicated: Secondary | ICD-10-CM

## 2018-07-19 DIAGNOSIS — Z79899 Other long term (current) drug therapy: Secondary | ICD-10-CM

## 2018-07-19 DIAGNOSIS — I150 Renovascular hypertension: Secondary | ICD-10-CM

## 2018-07-19 DIAGNOSIS — I701 Atherosclerosis of renal artery: Secondary | ICD-10-CM

## 2018-07-19 MED ORDER — TRAMADOL HCL 50 MG PO TABS: 50.0000 mg | ORAL_TABLET | Freq: Four times a day (QID) | ORAL | 0 refills | Status: AC | PRN

## 2018-07-19 NOTE — Assessment & Plan Note (Signed)
Blood pressure is 168/104 today.  Currently on spironolactone 50 mg daily, Coreg 25 mg twice daily, verapamil 80 mg 3 times daily. How ever she endorses that she has not taken her blood pressure medicine today.  Encouraged her to take her medications regularly. Will check her blood pressure at next visit, next week  -Continue spironolactone 50 mg daily, Coreg 25 mg twice daily, verapamil 80 mg 3 times daily -Come back in clinic in 1 week

## 2018-07-19 NOTE — Progress Notes (Signed)
   CC: Chronic pain, pain med. discussion  HPI:  [name] is a 62 y.o. F, with PMHx. listed below, is here in clinic to discussed her pain medicine refill. She has been on pain contract since 2014, due to right knee pain s/p surgery. She used to get Tramadol 50 mg q6 h that decreased to 50 mg BID due to renal impairment. Patient how ever complains of worsening of her knee pain, and mentions that she had to take Tramadol more often (q 6h, as before) to control her knee pain.   Past Medical History:  Diagnosis Date  . Colon polyps   . Colon polyps   . Diabetes mellitus   . Diverticulosis   . Hyperlipidemia   . Hypertension   . 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 Albertine Patricia  . Sleep apnea 2007    status post polysomnogram 2007 , suggested use of CPAP  . Ventricular hypertrophy 2006    a 2-D echo in 2006, ejection fraction 55%, mild tricuspid regurgitation noted as well, 2-D echo November 13, 3233 showed diastolic dysfunction with LVEF normal of 65%    Review of Systems:  Review of Systems  Constitutional: Negative for chills and fever.  Genitourinary: Negative for dysuria, flank pain, hematuria and urgency.  Musculoskeletal: Positive for joint pain.  Neurological: Negative for seizures.    Physical Exam:  Vitals:   07/19/18 0945  BP: (!) 168/104  Pulse: 87  Temp: 98 F (36.7 C)  TempSrc: Oral  SpO2: 100%  Weight: 202 lb 3.2 oz (91.7 kg)  Height: 5\' 2"  (1.575 m)   Physical Exam  Constitutional: She appears well-developed and well-nourished. No distress.  HENT:  Head: Normocephalic and atraumatic.  Eyes: Conjunctivae are normal.  Cardiovascular: Normal rate, regular rhythm and normal heart sounds.  Respiratory: Effort normal and breath sounds normal. No  respiratory distress. She has no wheezes.  GI: Soft. Bowel sounds are normal. She exhibits no distension. There is no tenderness.  Musculoskeletal: Scars on right knee surgery. No tenderness on knees. She exhibits no edema.  Neurological: She is alert.  Skin: She is not diaphoretic. No erythema.  Psychiatric: She has a normal mood and affect. Her behavior is normal. Judgment and thought content normal.   Assessment & Plan:   See Encounters Tab for problem based charting.  Patient seen with Dr. Rebeca Alert

## 2018-07-19 NOTE — Patient Instructions (Addendum)
It was our pleasure taking care of you in our clinic today. You came in to discuss your pain medication. You mentions that Tramadol 50 mg twice per day did not help with your knee pain. (We had reduced the dose due to your kidney function result.) Currently, your last kidney function is normal but we should monitor it. Please follow up with nephrologist as scheduled. I prescribe your Tramadol 50 mg to be taken every 6 h again for short time, but you need to come back to clinic next week so your PCP can decide with you about long term plan. I also send you to the lab for urine test. Please let us know if you have any question.  Thanks, Dr. Myrtie Hawk

## 2018-07-19 NOTE — Assessment & Plan Note (Signed)
She has been on pain contract since 2014, due to right knee pain s/p surgery. She used to get Tramadol 50 mg q6 h that decreased to 50 mg BID due to renal impairment. Patient how ever complains of worsening of her pain, and mentions that she had to take Tramadol more often (q 6h, as before) to control her knee pain. She says that adding Ibuprofen, did not help with her pain. Her last GFR 2 weeks ago came back to normal. [place] database result reviewed and was appropriate so we refill continue Tramadol 50 mg until she come back next week to be reevaluated.   -Urine drug screen test today -Tramadol 50, q 6h #120  -Follow up with you PCP next week  for BMP, UDS result and decide about Tramadol dose based on kidney function and patient pain status -Follow up with nephrologist as sheculed

## 2018-07-19 NOTE — Assessment & Plan Note (Signed)
Has an upcoming appointment with nephrologist.

## 2018-07-22 NOTE — Progress Notes (Signed)
Internal Medicine Clinic Attending  I saw and evaluated the patient.  I personally confirmed the key portions of the history and exam documented by Dr. Myrtie Hawk and I reviewed pertinent patient test results.  The assessment, diagnosis, and plan were formulated together and I agree with the documentation in the resident's note.  Unfortunately, not doing well with decreased frequency of tramadol that was implemented due to worsening of renal function with GFR dropping below 30. GFR is now back above 30 (note correction, GFR is not normal , but currently in mid-30s, consistent with CKD 3b), reluctantly agreed to increase tramadol back to q6h prn, but encouraged her to think about and discuss with PCP at next visit what long-term pain plan might look like as I expect her CKD to continue to progress, necessitating decrease in tramadol to BID dosing.  Lenice Pressman, M.D., Ph.D.

## 2018-07-23 LAB — TOXASSURE SELECT,+ANTIDEPR,UR

## 2018-07-28 ENCOUNTER — Encounter: Payer: Self-pay | Admitting: Internal Medicine

## 2018-07-28 ENCOUNTER — Other Ambulatory Visit: Payer: Self-pay

## 2018-07-28 ENCOUNTER — Ambulatory Visit (INDEPENDENT_AMBULATORY_CARE_PROVIDER_SITE_OTHER): Payer: Medicare Other | Admitting: Internal Medicine

## 2018-07-28 VITALS — BP 160/90 | HR 86 | Temp 97.9°F | Wt 201.9 lb

## 2018-07-28 DIAGNOSIS — N183 Chronic kidney disease, stage 3 unspecified: Secondary | ICD-10-CM

## 2018-07-28 DIAGNOSIS — Z6836 Body mass index (BMI) 36.0-36.9, adult: Secondary | ICD-10-CM

## 2018-07-28 DIAGNOSIS — E1142 Type 2 diabetes mellitus with diabetic polyneuropathy: Secondary | ICD-10-CM

## 2018-07-28 DIAGNOSIS — Z79891 Long term (current) use of opiate analgesic: Secondary | ICD-10-CM

## 2018-07-28 DIAGNOSIS — I701 Atherosclerosis of renal artery: Secondary | ICD-10-CM | POA: Diagnosis not present

## 2018-07-28 DIAGNOSIS — Z794 Long term (current) use of insulin: Secondary | ICD-10-CM

## 2018-07-28 DIAGNOSIS — Z79899 Other long term (current) drug therapy: Secondary | ICD-10-CM

## 2018-07-28 DIAGNOSIS — I150 Renovascular hypertension: Secondary | ICD-10-CM

## 2018-07-28 DIAGNOSIS — E1122 Type 2 diabetes mellitus with diabetic chronic kidney disease: Secondary | ICD-10-CM | POA: Diagnosis not present

## 2018-07-28 DIAGNOSIS — I773 Arterial fibromuscular dysplasia: Secondary | ICD-10-CM

## 2018-07-28 DIAGNOSIS — G8929 Other chronic pain: Secondary | ICD-10-CM | POA: Diagnosis not present

## 2018-07-28 DIAGNOSIS — I1310 Hypertensive heart and chronic kidney disease without heart failure, with stage 1 through stage 4 chronic kidney disease, or unspecified chronic kidney disease: Secondary | ICD-10-CM

## 2018-07-28 DIAGNOSIS — G4733 Obstructive sleep apnea (adult) (pediatric): Secondary | ICD-10-CM

## 2018-07-28 DIAGNOSIS — M170 Bilateral primary osteoarthritis of knee: Secondary | ICD-10-CM

## 2018-07-28 DIAGNOSIS — E663 Overweight: Secondary | ICD-10-CM

## 2018-07-28 LAB — GLUCOSE, CAPILLARY: Glucose-Capillary: 146 mg/dL — ABNORMAL HIGH (ref 70–99)

## 2018-07-28 MED ORDER — DICLOFENAC SODIUM 1 % TD GEL: 2.0000 g | Freq: Four times a day (QID) | TRANSDERMAL | 5 refills | Status: DC

## 2018-07-28 NOTE — Patient Instructions (Signed)
It was nice seeing you today. Thank you for choosing [facility] for your Primary Care.   Today we talked about:   1) Blood pressure: Take 1.5 tablets of Verapamil in the morning and 1.5 tablets at night. Continue Spironolactone and Carvedilol.   2) Arthritis pain: Take tylenol first, as your primary pain medication and then tramadol if you really need it.   3) I'll call you with your lab results    FOLLOW-UP INSTRUCTIONS When: 1 month for lab visit and 4 months for doctor visit  Please contact the clinic if you have any problems, or need to be seen sooner.

## 2018-07-29 DIAGNOSIS — M170 Bilateral primary osteoarthritis of knee: Secondary | ICD-10-CM | POA: Insufficient documentation

## 2018-07-29 DIAGNOSIS — M1712 Unilateral primary osteoarthritis, left knee: Secondary | ICD-10-CM | POA: Insufficient documentation

## 2018-07-29 LAB — BMP8+ANION GAP
ANION GAP: 15 mmol/L (ref 10.0–18.0)
BUN/Creatinine Ratio: 11 — ABNORMAL LOW (ref 12–28)
BUN: 18 mg/dL (ref 8–27)
CALCIUM: 9.8 mg/dL (ref 8.7–10.3)
CO2: 22 mmol/L (ref 20–29)
Chloride: 104 mmol/L (ref 96–106)
Creatinine, Ser: 1.71 mg/dL — ABNORMAL HIGH (ref 0.57–1.00)
GFR calc non Af Amer: 32 mL/min/{1.73_m2} — ABNORMAL LOW (ref >59)
GFR, EST AFRICAN AMERICAN: 36 mL/min/{1.73_m2} — AB (ref >59)
Glucose: 188 mg/dL — ABNORMAL HIGH (ref 65–99)
POTASSIUM: 4.4 mmol/L (ref 3.5–5.2)
SODIUM: 141 mmol/L (ref 134–144)

## 2018-07-29 NOTE — Assessment & Plan Note (Signed)
Renal function has improved and now stabilized at creatine 1.71 with GFR 36. Has appointment with nephrology in December. Will fax over lab results

## 2018-07-29 NOTE — Progress Notes (Signed)
   CC: chronic pain   HPI:  [name] is a 62 y.o. female with fibromuscular dysplasia, renal artery stenosis, HTN, OSA, DM2, CKD stage 3b, osteoarthritis who presents for follow up of chronic diseases.   Past Medical History:  Diagnosis Date  . Colon polyps   . Colon polyps   . Diabetes mellitus   . Diverticulosis   . Hyperlipidemia   . Hypertension   . 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 Albertine Patricia  . Sleep apnea 2007    status post polysomnogram 2007 , suggested use of CPAP  . Ventricular hypertrophy 2006    a 2-D echo in 2006, ejection fraction 55%, mild tricuspid regurgitation noted as well, 2-D echo November 13, 1186 showed diastolic dysfunction with LVEF normal of 65%    Physical Exam:  Vitals:   07/28/18 1517  BP: (!) 160/90  Pulse: 86  Temp: 97.9 F (36.6 C)  TempSrc: Oral  SpO2: 100%  Weight: 201 lb 14.4 oz (91.6 kg)   Gen: Well appearing, NAD CV: RRR, no murmurs Pulm: Normal effort, CTA throughout, no wheezing Ext: Warm, no edema    Assessment & Plan:   See Encounters Tab for problem based charting.  Patient seen with Dr. Evette Doffing

## 2018-07-29 NOTE — Assessment & Plan Note (Signed)
Right knee worse than left. Well controlled with tramadol since 2014 but now with worsening renal function. Reports taking 50mg  tramadol 2-3 times a day.   Plan: - try Tylenol as primary pain reliever and use tramadol for breakthrough - prescribe voltaren gel  - if creatine clearance drops below 30, will need to adjust tramadol dose to 50-100mg  q12h  Addendum: - CrCl 18ml/min or 102ml/min if adjusted for overweight patient

## 2018-07-29 NOTE — Assessment & Plan Note (Signed)
Reports adherence with metformin, lantus, and victoza. Denies symptomatic hypoglycemia. Did not bring glucometer today. Last a1c was 2 months ago and was 8.5  Plan: - future order for lab visit for a1c in one month - f/u with doctor in 4 months

## 2018-07-29 NOTE — Assessment & Plan Note (Signed)
BP Readings from Last 3 Encounters:  07/28/18 (!) 160/90  07/19/18 (!) 168/104  06/24/18 139/75   Better controlled since starting verapamil, spiro, and coreg. Denies headache, chest pain, sob, blurry vision.   Plan: - change verapamil dosing from 80mg  TID to 120mg  BID - check BMP  Addendum: - renal function is stable compared to last check

## 2018-08-01 NOTE — Progress Notes (Signed)
Internal Medicine Clinic Attending  I saw and evaluated the patient.  I personally confirmed the key portions of the history and exam documented by Dr. Vogel and I reviewed pertinent patient test results.  The assessment, diagnosis, and plan were formulated together and I agree with the documentation in the resident's note.  

## 2018-08-05 ENCOUNTER — Telehealth: Payer: Self-pay | Admitting: *Deleted

## 2018-08-05 NOTE — Telephone Encounter (Signed)
Call to [facility] for PA for Diclofenac Gel1% grandfathered approval 10/26/2017 thru 10/25/2018 .  [place] was called and contacted about.  Approved under Voltaren Gel 1%. Attempts to call patient by home phone and cell unable to reach or leave a message.  Sander Nephew, RN 08/05/2018 3:00 PM.

## 2018-08-10 ENCOUNTER — Other Ambulatory Visit: Payer: Self-pay

## 2018-08-10 NOTE — Patient Outreach (Signed)
[facility]) Care Management  08/10/2018  Kristy [name] 24-Apr-1956 [id number]   Medication Adherence call to Mrs. Kristy [name] spoke with patient she ask if we can call her new doctor to send a prescription to [facility] because she does not want to be using [facility] any more patient is due on Rosuvastatin 20 mg.Kristy [name] is showing past due under [place].   [facility] Direct Dial [phone]  Fax [phone] [name]

## 2018-08-29 ENCOUNTER — Other Ambulatory Visit (INDEPENDENT_AMBULATORY_CARE_PROVIDER_SITE_OTHER): Payer: Medicare Other

## 2018-08-29 ENCOUNTER — Encounter (INDEPENDENT_AMBULATORY_CARE_PROVIDER_SITE_OTHER): Payer: Self-pay

## 2018-08-29 ENCOUNTER — Other Ambulatory Visit: Payer: Self-pay | Admitting: Pharmacist

## 2018-08-29 DIAGNOSIS — E1142 Type 2 diabetes mellitus with diabetic polyneuropathy: Secondary | ICD-10-CM

## 2018-08-29 DIAGNOSIS — E559 Vitamin D deficiency, unspecified: Secondary | ICD-10-CM

## 2018-08-29 DIAGNOSIS — E785 Hyperlipidemia, unspecified: Secondary | ICD-10-CM

## 2018-08-29 DIAGNOSIS — I150 Renovascular hypertension: Secondary | ICD-10-CM

## 2018-08-29 DIAGNOSIS — I10 Essential (primary) hypertension: Secondary | ICD-10-CM

## 2018-08-29 LAB — GLUCOSE, CAPILLARY: Glucose-Capillary: 80 mg/dL (ref 70–99)

## 2018-08-29 LAB — POCT GLYCOSYLATED HEMOGLOBIN (HGB A1C): HEMOGLOBIN A1C: 5.9 % — AB (ref 4.0–5.6)

## 2018-08-29 MED ORDER — METFORMIN HCL ER 500 MG PO TB24: 500.0000 mg | ORAL_TABLET | Freq: Two times a day (BID) | ORAL | 3 refills | Status: DC

## 2018-08-29 MED ORDER — ROSUVASTATIN CALCIUM 20 MG PO TABS: 20.0000 mg | ORAL_TABLET | Freq: Every day | ORAL | 3 refills | Status: DC

## 2018-08-29 MED ORDER — CHOLECALCIFEROL 25 MCG (1000 UT) PO CAPS: 1000.0000 [IU] | ORAL_CAPSULE | Freq: Every day | ORAL | 2 refills | Status: DC

## 2018-08-29 MED ORDER — CARVEDILOL 25 MG PO TABS: 25.0000 mg | ORAL_TABLET | Freq: Two times a day (BID) | ORAL | 3 refills | Status: DC

## 2018-08-29 MED ORDER — INSULIN DEGLUDEC-LIRAGLUTIDE 100-3.6 UNIT-MG/ML ~~LOC~~ SOPN: 45.0000 [IU] | PEN_INJECTOR | Freq: Every day | SUBCUTANEOUS | 3 refills | Status: DC

## 2018-08-29 MED ORDER — SPIRONOLACTONE 50 MG PO TABS: 50.0000 mg | ORAL_TABLET | Freq: Every day | ORAL | 3 refills | Status: DC

## 2018-08-29 NOTE — Progress Notes (Signed)
Patient requested transfers to [place]. Prescriptions sent (verapamil was already sent to [facility]).  I also combined the Victoza and Lantus for regimen simplification by switching to Xultophy. Patient education was provided on this change and she verbalized understanding. Will notify PCP.

## 2018-08-30 ENCOUNTER — Telehealth: Payer: Self-pay | Admitting: *Deleted

## 2018-08-30 DIAGNOSIS — E1142 Type 2 diabetes mellitus with diabetic polyneuropathy: Secondary | ICD-10-CM

## 2018-08-30 NOTE — Telephone Encounter (Addendum)
Call to [place] information was given information  for PA for Xultophy 100-3.6 unit/ml.  Information to be sent to [place] for review.  Awaiting decision.  Sander Nephew, RN 08/30/2018 11:56 AM.   Denial received for Xultophy patient will need to try and fail 2 of the follow ing.Tresiba,  Victoza or Soliqua.  Message to be forwarded to Dr. Donne Hazel to consider a change.  Sander Nephew, RN 08/31/2018 9:14 AM

## 2018-08-31 MED ORDER — LIRAGLUTIDE 18 MG/3ML ~~LOC~~ SOPN: 1.2000 mg | PEN_INJECTOR | Freq: Every day | SUBCUTANEOUS | 6 refills | Status: DC

## 2018-08-31 MED ORDER — INSULIN GLARGINE 100 UNIT/ML ~~LOC~~ SOLN: 45.0000 [IU] | Freq: Every day | SUBCUTANEOUS | 11 refills | Status: DC

## 2018-08-31 NOTE — Telephone Encounter (Signed)
Ok. I have represcribed lantus and victoza separately.   Dr. Maudie Mercury, just making you aware Xultophy did not get approved by Ms. Sportsman's insurance.

## 2018-09-07 ENCOUNTER — Other Ambulatory Visit: Payer: Self-pay | Admitting: Internal Medicine

## 2018-09-07 DIAGNOSIS — F1190 Opioid use, unspecified, uncomplicated: Secondary | ICD-10-CM

## 2018-09-08 ENCOUNTER — Telehealth: Payer: Self-pay | Admitting: *Deleted

## 2018-09-08 NOTE — Telephone Encounter (Signed)
Dr Donne Hazel, cover my meds called, they discovered that there was a typo in DOB info, instead of Apr 17, 1956, 11/07/1955 was entered. The rep said that if you want to change back to xultophy at some point it will be approved. This is Pharmacist, hospital

## 2018-09-09 MED ORDER — INSULIN DEGLUDEC-LIRAGLUTIDE 100-3.6 UNIT-MG/ML ~~LOC~~ SOPN: 45.0000 [IU] | PEN_INJECTOR | Freq: Every day | SUBCUTANEOUS | 3 refills | Status: DC

## 2018-09-12 ENCOUNTER — Telehealth: Payer: Self-pay | Admitting: *Deleted

## 2018-09-12 ENCOUNTER — Other Ambulatory Visit: Payer: Self-pay

## 2018-09-12 NOTE — Telephone Encounter (Signed)
Needs to speak with a nurse about   traMADol (ULTRAM) 50 MG tablet.

## 2018-09-12 NOTE — Telephone Encounter (Signed)
Pt calls and states she needs more tramadol than the directions are calling for, she said she was told her labs are better enough to change back to more tramadol. Triage does not see any new labs to confirm this, please clarify or call pt. Thank you

## 2018-09-12 NOTE — Telephone Encounter (Signed)
Called ph#'s for pt, a female answered ph at ph# to call back lm for rtc

## 2018-09-13 ENCOUNTER — Other Ambulatory Visit: Payer: Self-pay | Admitting: Internal Medicine

## 2018-09-13 DIAGNOSIS — F1190 Opioid use, unspecified, uncomplicated: Secondary | ICD-10-CM

## 2018-09-13 MED ORDER — TRAMADOL HCL 50 MG PO TABS: 100.0000 mg | ORAL_TABLET | Freq: Two times a day (BID) | ORAL | 3 refills | Status: DC | PRN

## 2018-09-13 NOTE — Telephone Encounter (Signed)
I called and talked with the patient. I will increase her dose. The new Rx has been sent. Patient is aware and understands.

## 2018-09-15 DIAGNOSIS — E1122 Type 2 diabetes mellitus with diabetic chronic kidney disease: Secondary | ICD-10-CM | POA: Diagnosis not present

## 2018-09-15 DIAGNOSIS — D631 Anemia in chronic kidney disease: Secondary | ICD-10-CM | POA: Diagnosis not present

## 2018-09-15 DIAGNOSIS — I701 Atherosclerosis of renal artery: Secondary | ICD-10-CM | POA: Diagnosis not present

## 2018-09-15 DIAGNOSIS — N183 Chronic kidney disease, stage 3 (moderate): Secondary | ICD-10-CM | POA: Diagnosis not present

## 2018-09-15 DIAGNOSIS — I129 Hypertensive chronic kidney disease with stage 1 through stage 4 chronic kidney disease, or unspecified chronic kidney disease: Secondary | ICD-10-CM | POA: Diagnosis not present

## 2018-09-28 ENCOUNTER — Ambulatory Visit (INDEPENDENT_AMBULATORY_CARE_PROVIDER_SITE_OTHER): Payer: Medicare Other | Admitting: Pharmacist

## 2018-09-28 DIAGNOSIS — E1142 Type 2 diabetes mellitus with diabetic polyneuropathy: Secondary | ICD-10-CM | POA: Diagnosis not present

## 2018-09-28 NOTE — Progress Notes (Addendum)
S: Kristy [name] is a 62 y.o. female reports to clinical pharmacist appointment for diabetes medication management follow up.  No Known Allergies Medication Sig  losartan (COZAAR) 50 MG tablet Take 50 mg by mouth daily.  ACCU-CHEK FASTCLIX LANCETS MISC Check blood sugar two times a day  aspirin 81 MG EC tablet Take 81 mg by mouth daily.    Blood Glucose Monitoring Suppl (ACCU-CHEK GUIDE) w/Device KIT 1 each by Does not apply route 2 (two) times daily.  carvedilol (COREG) 25 MG tablet Take 1 tablet (25 mg total) by mouth 2 (two) times daily.  Cholecalciferol 1000 units capsule Take 1 capsule (1,000 Units total) by mouth daily.  diclofenac sodium (VOLTAREN) 1 % GEL Apply 2 g topically 4 (four) times daily.  glucose blood (ACCU-CHEK GUIDE) test strip Check blood sugar two times a day  Insulin Degludec-Liraglutide (XULTOPHY) 100-3.6 UNIT-MG/ML SOPN Inject 45 Units into the skin daily.  insulin glargine (LANTUS) 100 UNIT/ML injection Inject 0.45 mLs (45 Units total) into the skin at bedtime.  Insulin Pen Needle (B-D UF III MINI PEN NEEDLES) 31G X 5 MM MISC 1 Device by Does not apply route 3 (three) times daily before meals. Check daily 3X before meals  Insulin Syringe-Needle U-100 (INSULIN SYRINGE .5CC/31GX5/16") 31G X 5/16" 0.5 ML MISC Inject 1 Dose into the skin daily. The patient is insulin requiring, ICD 10 code E11.42. Insulin 1 time daily  liraglutide (VICTOZA) 18 MG/3ML SOPN Inject 0.2 mLs (1.2 mg total) into the skin daily.  metFORMIN (GLUCOPHAGE XR) 500 MG 24 hr tablet Take 1 tablet (500 mg total) by mouth 2 (two) times daily with a meal.  rosuvastatin (CRESTOR) 20 MG tablet Take 1 tablet (20 mg total) by mouth daily.  spironolactone (ALDACTONE) 50 MG tablet Take 1 tablet (50 mg total) by mouth daily.  traMADol (ULTRAM) 50 MG tablet Take 2 tablets (100 mg total) by mouth every 12 (twelve) hours as needed.  verapamil (CALAN) 80 MG tablet Take 1 tablet (80 mg total) by mouth 3 (three) times  daily.   Past Medical History:  Diagnosis Date  . Colon polyps   . Colon polyps   . Diabetes mellitus   . Diverticulosis   . Hyperlipidemia   . Hypertension   . 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 Albertine Patricia  . Sleep apnea 2007    status post polysomnogram 2007 , suggested use of CPAP  . Ventricular hypertrophy 2006    a 2-D echo in 2006, ejection fraction 55%, mild tricuspid regurgitation noted as well, 2-D echo November 13, 1608 showed diastolic dysfunction with LVEF normal of 65%   Social History   Socioeconomic History  . Marital status: Married    Spouse name: Not on file  . Number of children: Not on file  . Years of education: 22  . Highest education level: Not on file  Occupational History  . Not on file  Social Needs  . Financial resource strain: Not on file  . Food insecurity:    Worry: Not on file    Inability: Not on file  . Transportation needs:    Medical: Not on file    Non-medical: Not on file  Tobacco Use  . Smoking status: Former Smoker    Packs/day: 0.33    Years:  25.00    Pack years: 8.25    Last attempt to quit: 10/26/2005    Years since quitting: 12.9  . Smokeless tobacco: Never Used  Substance and Sexual Activity  . Alcohol use: No    Alcohol/week: 0.0 standard drinks  . Drug use: No  . Sexual activity: Not on file  Lifestyle  . Physical activity:    Days per week: Not on file    Minutes per session: Not on file  . Stress: Not on file  Relationships  . Social connections:    Talks on phone: Not on file    Gets together: Not on file    Attends religious service: Not on file    Active member of club or organization: Not on file    Attends meetings of clubs or organizations: Not on file    Relationship status: Not on file  Other  Topics Concern  . Not on file  Social History Narrative   Lives with husband and two sons.    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   Completed to 11th grade.   Family History  Problem Relation Age of Onset  . Diabetes Mother   . Cancer Mother   . Diabetes Father   . Cancer Other        breast cancer died in her 44s  . Bipolar disorder Son   . Bipolar disorder Daughter   . Alcohol abuse Son   . Drug abuse Son   . Breast cancer Cousin   . Stroke Neg Hx    O: Component Value Date/Time   CHOL 161 06/03/2017 1322   HDL 44 06/03/2017 1322   LDLCALC 89 06/03/2017 1322   TRIG 140 06/03/2017 1322   GLUCOSE 188 (H) 07/28/2018 1557   GLUCOSE 117 (H) 01/10/2015 1534   HGBA1C 5.9 (A) 08/29/2018 1432   HGBA1C 6.6 09/30/2010 1444   NA 141 07/28/2018 1557   K 4.4 07/28/2018 1557   CL 104 07/28/2018 1557   CO2 22 07/28/2018 1557   BUN 18 07/28/2018 1557   CREATININE 1.71 (H) 07/28/2018 1557   CREATININE 1.11 (H) 01/10/2015 1534   CALCIUM 9.8 07/28/2018 1557   GFRNONAA 32 (L) 07/28/2018 1557   GFRNONAA 55 (L) 01/10/2015 1534   GFRAA 36 (L) 07/28/2018 1557   GFRAA 63 01/10/2015 1534   AST 15 06/15/2018 1513   ALT 16 06/15/2018 1513   WBC 8.0 06/03/2017 1322   WBC 7.8 08/09/2014 1156   HGB 13.1 06/03/2017 1322   HCT 41.9 06/03/2017 1322   PLT 245 06/03/2017 1322   TSH 1.241 08/09/2014 1156   Ht Readings from Last 2 Encounters:  07/19/18 _0  (1.575 m)  06/24/18 _1  (1.575 m)   Wt Readings from Last 2 Encounters:  07/28/18 201 lb 14.4 oz (91.6 kg)  07/19/18 202 lb 3.2 oz (91.7 kg)   There is no height or weight on file to calculate BMI. BP Readings from Last 3 Encounters:  07/28/18 (!) 160/90  07/19/18 (!) 168/104  06/24/18 139/75   A/P: Patient enrolled in intern CGM quality initiative based on clinical criteria. Patient states she has not noticed hypoglycemia on home BG monitoring, but only checks BG once or twice per week.  Documentation for  Freestyle Libre Pro Continuous glucose monitoring Freestyle Libre Pro CGM sensor placed today. Patient was educated about wearing sensor, keeping food, activity and medication log and when to call office. Patient was educated about how  to care for the sensor and not to have an MRI, CT or Diathermy while wearing the sensor. Follow up was arranged with the patient for 1 week.   Lot #: [id number] Serial #: [id number] Expiration Date: 02/23/19  Flossie Dibble, PharmD 09/28/2018 12:52 PM.  An after visit summary was provided and patient advised to follow up in 1 week or sooner if any changes in condition or questions regarding medications arise.   The patient verbalized understanding of information provided by repeating back concepts discussed.   15 minutes spent face-to-face with the patient during the encounter. 50% of time spent on education. 50% of time was spent on assessment and plan.

## 2018-09-28 NOTE — Patient Instructions (Signed)
Please record the time, amount and what food drinks and activities you have while wearing the continuous glucose monitor(CGM) in the folder provided.  Bring the folder with you to follow up appointments  Do not have a CT or an MRI while wearing the CGM.   Please make an appointment for 1 week with me and a doctor for the first of two CGM downloads..   You will also return in 2 weeks to have your second download and the CGM removed.  

## 2018-10-06 ENCOUNTER — Ambulatory Visit: Payer: Medicare Other | Admitting: Pharmacist

## 2018-10-06 ENCOUNTER — Ambulatory Visit (INDEPENDENT_AMBULATORY_CARE_PROVIDER_SITE_OTHER): Payer: Medicare Other | Admitting: Internal Medicine

## 2018-10-06 DIAGNOSIS — E1142 Type 2 diabetes mellitus with diabetic polyneuropathy: Secondary | ICD-10-CM

## 2018-10-06 NOTE — Patient Instructions (Signed)
Patient educated about medication as defined in this encounter and verbalized understanding by repeating back instructions provided.   

## 2018-10-06 NOTE — Progress Notes (Signed)
S: Kristy [name] is a 62 y.o. female reports to clinical pharmacist appointment for CGM for week #1 download.  No Known Allergies Medication Sig  ACCU-CHEK FASTCLIX LANCETS MISC Check blood sugar two times a day  aspirin 81 MG EC tablet Take 81 mg by mouth daily.    Blood Glucose Monitoring Suppl (ACCU-CHEK GUIDE) w/Device KIT 1 each by Does not apply route 2 (two) times daily.  carvedilol (COREG) 25 MG tablet Take 1 tablet (25 mg total) by mouth 2 (two) times daily.  Cholecalciferol 1000 units capsule Take 1 capsule (1,000 Units total) by mouth daily.  diclofenac sodium (VOLTAREN) 1 % GEL Apply 2 g topically 4 (four) times daily.  glucose blood (ACCU-CHEK GUIDE) test strip Check blood sugar two times a day  insulin glargine (LANTUS) 100 UNIT/ML injection Inject 0.45 mLs (45 Units total) into the skin at bedtime. Patient taking differently: Inject 40 Units into the skin at bedtime.   Insulin Pen Needle (B-D UF III MINI PEN NEEDLES) 31G X 5 MM MISC 1 Device by Does not apply route 3 (three) times daily before meals. Check daily 3X before meals  Insulin Syringe-Needle U-100 (INSULIN SYRINGE .5CC/31GX5/16") 31G X 5/16" 0.5 ML MISC Inject 1 Dose into the skin daily. The patient is insulin requiring, ICD 10 code E11.42. Insulin 1 time daily  liraglutide (VICTOZA) 18 MG/3ML SOPN Inject 0.2 mLs (1.2 mg total) into the skin daily. Patient taking differently: Inject 1.8 mg into the skin daily.   losartan (COZAAR) 50 MG tablet Take 50 mg by mouth daily.  metFORMIN (GLUCOPHAGE XR) 500 MG 24 hr tablet Take 1 tablet (500 mg total) by mouth 2 (two) times daily with a meal.  rosuvastatin (CRESTOR) 20 MG tablet Take 1 tablet (20 mg total) by mouth daily.  spironolactone (ALDACTONE) 50 MG tablet Take 1 tablet (50 mg total) by mouth daily.  traMADol (ULTRAM) 50 MG tablet Take 2 tablets (100 mg total) by mouth every 12 (twelve) hours as needed.  verapamil (CALAN) 80 MG tablet Take 1 tablet (80 mg total) by  mouth 3 (three) times daily.   Past Medical History:  Diagnosis Date  . Colon polyps   . Colon polyps   . Diabetes mellitus   . Diverticulosis   . Hyperlipidemia   . Hypertension   . 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 Downey  . Sleep apnea 2007    status post polysomnogram 2007 , suggested use of CPAP  . Ventricular hypertrophy 2006    a 2-D echo in 2006, ejection fraction 55%, mild tricuspid regurgitation noted as well, 2-D echo November 13, 2008 showed diastolic dysfunction with LVEF normal of 65%   Social History   Socioeconomic History  . Marital status: Married    Spouse name: Not on file  . Number of children: Not on file  . Years of education: 11  . Highest education level: Not on file  Occupational History  . Not on file  Social Needs  . Financial resource strain: Not on file  . Food insecurity:    Worry: Not on file    Inability: Not on file  . Transportation needs:    Medical: Not on file    Non-medical: Not on file  Tobacco Use  . Smoking status: Former Smoker      Packs/day: 0.33    Years: 25.00    Pack years: 8.25    Last attempt to quit: 10/26/2005    Years since quitting: 12.9  . Smokeless tobacco: Never Used  Substance and Sexual Activity  . Alcohol use: No    Alcohol/week: 0.0 standard drinks  . Drug use: No  . Sexual activity: Not on file  Lifestyle  . Physical activity:    Days per week: Not on file    Minutes per session: Not on file  . Stress: Not on file  Relationships  . Social connections:    Talks on phone: Not on file    Gets together: Not on file    Attends religious service: Not on file    Active member of club or organization: Not on file    Attends meetings of clubs or organizations: Not on file    Relationship  status: Not on file  Other Topics Concern  . Not on file  Social History Narrative   Lives with husband and two sons.    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   Completed to 11th grade.   Family History  Problem Relation Age of Onset  . Diabetes Mother   . Cancer Mother   . Diabetes Father   . Cancer Other        breast cancer died in her 73s  . Bipolar disorder Son   . Bipolar disorder Daughter   . Alcohol abuse Son   . Drug abuse Son   . Breast cancer Cousin   . Stroke Neg Hx    O:    Component Value Date/Time   CHOL 161 06/03/2017 1322   HDL 44 06/03/2017 1322   LDLCALC 89 06/03/2017 1322   TRIG 140 06/03/2017 1322   GLUCOSE 188 (H) 07/28/2018 1557   GLUCOSE 117 (H) 01/10/2015 1534   HGBA1C 5.9 (A) 08/29/2018 1432   HGBA1C 6.6 09/30/2010 1444   NA 141 07/28/2018 1557   K 4.4 07/28/2018 1557   CL 104 07/28/2018 1557   CO2 22 07/28/2018 1557   BUN 18 07/28/2018 1557   CREATININE 1.71 (H) 07/28/2018 1557   CREATININE 1.11 (H) 01/10/2015 1534   CALCIUM 9.8 07/28/2018 1557   GFRNONAA 32 (L) 07/28/2018 1557   GFRNONAA 55 (L) 01/10/2015 1534   GFRAA 36 (L) 07/28/2018 1557   GFRAA 63 01/10/2015 1534   AST 15 06/15/2018 1513   ALT 16 06/15/2018 1513   WBC 8.0 06/03/2017 1322   WBC 7.8 08/09/2014 1156   HGB 13.1 06/03/2017 1322   HCT 41.9 06/03/2017 1322   PLT 245 06/03/2017 1322   TSH 1.241 08/09/2014 1156   Ht Readings from Last 2 Encounters:  07/19/18 5' 2" (1.575 m)  06/24/18 5' 2" (1.575 m)   Wt Readings from Last 2 Encounters:  07/28/18 201 lb 14.4 oz (91.6 kg)  07/19/18 202 lb 3.2 oz (91.7 kg)   There is no height or weight on file to calculate BMI. BP Readings from Last 3 Encounters:  07/28/18 (!) 160/90  07/19/18 (!) 168/104  06/24/18 139/75   A/P: Patient was seen today in a co-visit with Dr. Tarri Abernethy.  See documentation under Dr. Jerrell Mylar visit for details.

## 2018-10-06 NOTE — Progress Notes (Signed)
S: Kristy [name] is a 62 y.o. female reports to appointment for week #1 CGM download for intern QI initiative.   No Known Allergies Medication Sig  ACCU-CHEK FASTCLIX LANCETS MISC Check blood sugar two times a day  aspirin 81 MG EC tablet Take 81 mg by mouth daily.    Blood Glucose Monitoring Suppl (ACCU-CHEK GUIDE) w/Device KIT 1 each by Does not apply route 2 (two) times daily.  carvedilol (COREG) 25 MG tablet Take 1 tablet (25 mg total) by mouth 2 (two) times daily.  Cholecalciferol 1000 units capsule Take 1 capsule (1,000 Units total) by mouth daily.  diclofenac sodium (VOLTAREN) 1 % GEL Apply 2 g topically 4 (four) times daily.  glucose blood (ACCU-CHEK GUIDE) test strip Check blood sugar two times a day  insulin glargine (LANTUS) 100 UNIT/ML injection Inject 0.45 mLs (45 Units total) into the skin at bedtime. Patient taking differently: Inject 40 Units into the skin at bedtime.   Insulin Pen Needle (B-D UF III MINI PEN NEEDLES) 31G X 5 MM MISC 1 Device by Does not apply route 3 (three) times daily before meals. Check daily 3X before meals  Insulin Syringe-Needle U-100 (INSULIN SYRINGE .5CC/31GX5/16") 31G X 5/16" 0.5 ML MISC Inject 1 Dose into the skin daily. The patient is insulin requiring, ICD 10 code E11.42. Insulin 1 time daily  liraglutide (VICTOZA) 18 MG/3ML SOPN Inject 0.2 mLs (1.2 mg total) into the skin daily. Patient taking differently: Inject 1.8 mg into the skin daily.   losartan (COZAAR) 50 MG tablet Take 50 mg by mouth daily.  metFORMIN (GLUCOPHAGE XR) 500 MG 24 hr tablet Take 1 tablet (500 mg total) by mouth 2 (two) times daily with a meal.  rosuvastatin (CRESTOR) 20 MG tablet Take 1 tablet (20 mg total) by mouth daily.  spironolactone (ALDACTONE) 50 MG tablet Take 1 tablet (50 mg total) by mouth daily.  traMADol (ULTRAM) 50 MG tablet Take 2 tablets (100 mg total) by mouth every 12 (twelve) hours as needed.  verapamil (CALAN) 80 MG tablet Take 1 tablet (80 mg total) by  mouth 3 (three) times daily.   Past Medical History:  Diagnosis Date  . Colon polyps   . Colon polyps   . Diabetes mellitus   . Diverticulosis   . Hyperlipidemia   . Hypertension   . 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 Albertine Patricia  . Sleep apnea 2007    status post polysomnogram 2007 , suggested use of CPAP  . Ventricular hypertrophy 2006    a 2-D echo in 2006, ejection fraction 55%, mild tricuspid regurgitation noted as well, 2-D echo November 14, 7671 showed diastolic dysfunction with LVEF normal of 65%   Social History   Socioeconomic History  . Marital status: Married    Spouse name: Not on file  . Number of children: Not on file  . Years of education: 4  . Highest education level: Not on file  Occupational History  . Not on file  Social Needs  . Financial resource strain: Not on file  . Food insecurity:    Worry: Not on file    Inability: Not on file  . Transportation needs:    Medical: Not on file    Non-medical: Not on file  Tobacco Use  . Smoking status:  Former Smoker    Packs/day: 0.33    Years: 25.00    Pack years: 8.25    Last attempt to quit: 10/26/2005    Years since quitting: 12.9  . Smokeless tobacco: Never Used  Substance and Sexual Activity  . Alcohol use: No    Alcohol/week: 0.0 standard drinks  . Drug use: No  . Sexual activity: Not on file  Lifestyle  . Physical activity:    Days per week: Not on file    Minutes per session: Not on file  . Stress: Not on file  Relationships  . Social connections:    Talks on phone: Not on file    Gets together: Not on file    Attends religious service: Not on file    Active member of club or organization: Not on file    Attends meetings of clubs or organizations: Not on file    Relationship  status: Not on file  Other Topics Concern  . Not on file  Social History Narrative   Lives with husband and two sons.    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   Completed to 11th grade.   Family History  Problem Relation Age of Onset  . Diabetes Mother   . Cancer Mother   . Diabetes Father   . Cancer Other        breast cancer died in her 37s  . Bipolar disorder Son   . Bipolar disorder Daughter   . Alcohol abuse Son   . Drug abuse Son   . Breast cancer Cousin   . Stroke Neg Hx    O:    Component Value Date/Time   CHOL 161 06/03/2017 1322   HDL 44 06/03/2017 1322   LDLCALC 89 06/03/2017 1322   TRIG 140 06/03/2017 1322   GLUCOSE 188 (H) 07/28/2018 1557   GLUCOSE 117 (H) 01/10/2015 1534   HGBA1C 5.9 (A) 08/29/2018 1432   HGBA1C 6.6 09/30/2010 1444   NA 141 07/28/2018 1557   K 4.4 07/28/2018 1557   CL 104 07/28/2018 1557   CO2 22 07/28/2018 1557   BUN 18 07/28/2018 1557   CREATININE 1.71 (H) 07/28/2018 1557   CREATININE 1.11 (H) 01/10/2015 1534   CALCIUM 9.8 07/28/2018 1557   GFRNONAA 32 (L) 07/28/2018 1557   GFRNONAA 55 (L) 01/10/2015 1534   GFRAA 36 (L) 07/28/2018 1557   GFRAA 63 01/10/2015 1534   AST 15 06/15/2018 1513   ALT 16 06/15/2018 1513   WBC 8.0 06/03/2017 1322   WBC 7.8 08/09/2014 1156   HGB 13.1 06/03/2017 1322   HCT 41.9 06/03/2017 1322   PLT 245 06/03/2017 1322   TSH 1.241 08/09/2014 1156   Ht Readings from Last 2 Encounters:  07/19/18 '5\' 2"'$  (1.575 m)  06/24/18 '5\' 2"'$  (1.575 m)   Wt Readings from Last 2 Encounters:  07/28/18 201 lb 14.4 oz (91.6 kg)  07/19/18 202 lb 3.2 oz (91.7 kg)   There is no height or weight on file to calculate BMI. BP Readings from Last 3 Encounters:  07/28/18 (!) 160/90  07/19/18 (!) 168/104  06/24/18 139/75   A/P: Co-visit (Dr. Tarri Abernethy w/ Mannie Stabile, discussed findings and plan with attending physician). Patient presents in no distress and reports no signs or symptoms of concern.  Freestyle  [employer] and Reviewed  Freestyle Libre sensor placed 09/28/18  Week #1 of monitoring  8-day BG average 103, within target range 83% of the  time  Above 180 mg/dL 2% of the time, highest around nighttime  Below 70 mg/dL 15% of the time, lowest around morning  Patient reports currently taking Xultophy (liraglutide-insulin degludec) for diabetes, 45 units daily, but only has 2 days supply remaining and unable to afford the medication. Patient was referred to [facility] Help program. In response to hypoglycemia, the Xultophy was divided into insulin degludec (decreased to 40 units daily) and liraglutide 1.8 mg daily.  Home BG monitoring was also reviewed with patient. She checks BG every other day and different times each day. Home BG meter results were found to correlate with the CGM results. Patient was educated and advised to check home BG at least twice per day if possible, prior to meals. An after visit summary was provided and patient advised to follow up in 1 week or sooner if any changes in condition or questions regarding medications arise.   The patient verbalized understanding of information provided by repeating back concepts discussed.   15 minutes spent face-to-face with the patient during the encounter. 50% of time spent on education. 50% of time was spent on assessment, plan, and coordination of care.

## 2018-10-13 ENCOUNTER — Ambulatory Visit (INDEPENDENT_AMBULATORY_CARE_PROVIDER_SITE_OTHER): Payer: Medicare Other | Admitting: Pharmacist

## 2018-10-13 DIAGNOSIS — Z794 Long term (current) use of insulin: Secondary | ICD-10-CM | POA: Diagnosis not present

## 2018-10-13 DIAGNOSIS — E1142 Type 2 diabetes mellitus with diabetic polyneuropathy: Secondary | ICD-10-CM

## 2018-10-13 MED ORDER — LIRAGLUTIDE 18 MG/3ML ~~LOC~~ SOPN: 1.8000 mg | PEN_INJECTOR | Freq: Every day | SUBCUTANEOUS | 11 refills | Status: DC

## 2018-10-13 MED ORDER — INSULIN GLARGINE 100 UNIT/ML SOLOSTAR PEN: 40.0000 [IU] | PEN_INJECTOR | Freq: Every day | SUBCUTANEOUS | 11 refills | Status: DC

## 2018-10-13 NOTE — Progress Notes (Signed)
S: Kristy [name] is a 62 y.o. female reports to clinical pharmacist appointment for continuous glucose monitoring week 2  No Known Allergies Medication Sig  ACCU-CHEK FASTCLIX LANCETS MISC Check blood sugar two times a day  aspirin 81 MG EC tablet Take 81 mg by mouth daily.    Blood Glucose Monitoring Suppl (ACCU-CHEK GUIDE) w/Device KIT 1 each by Does not apply route 2 (two) times daily.  carvedilol (COREG) 25 MG tablet Take 1 tablet (25 mg total) by mouth 2 (two) times daily.  Cholecalciferol 1000 units capsule Take 1 capsule (1,000 Units total) by mouth daily.  diclofenac sodium (VOLTAREN) 1 % GEL Apply 2 g topically 4 (four) times daily.  glucose blood (ACCU-CHEK GUIDE) test strip Check blood sugar two times a day  Insulin Glargine (LANTUS SOLOSTAR) 100 UNIT/ML Solostar Pen Inject 40 Units into the skin daily.  Insulin Pen Needle (B-D UF III MINI PEN NEEDLES) 31G X 5 MM MISC 1 Device by Does not apply route 3 (three) times daily before meals. Check daily 3X before meals  Insulin Syringe-Needle U-100 (INSULIN SYRINGE .5CC/31GX5/16") 31G X 5/16" 0.5 ML MISC Inject 1 Dose into the skin daily. The patient is insulin requiring, ICD 10 code E11.42. Insulin 1 time daily  liraglutide (VICTOZA) 18 MG/3ML SOPN Inject 0.3 mLs (1.8 mg total) into the skin daily.  losartan (COZAAR) 50 MG tablet Take 50 mg by mouth daily.  metFORMIN (GLUCOPHAGE XR) 500 MG 24 hr tablet Take 1 tablet (500 mg total) by mouth 2 (two) times daily with a meal.  rosuvastatin (CRESTOR) 20 MG tablet Take 1 tablet (20 mg total) by mouth daily.  spironolactone (ALDACTONE) 50 MG tablet Take 1 tablet (50 mg total) by mouth daily.  traMADol (ULTRAM) 50 MG tablet Take 2 tablets (100 mg total) by mouth every 12 (twelve) hours as needed.  verapamil (CALAN) 80 MG tablet Take 1 tablet (80 mg total) by mouth 3 (three) times daily.   Past Medical History:  Diagnosis Date  . Colon polyps   . Colon polyps   . Diabetes mellitus   .  Diverticulosis   . Hyperlipidemia   . Hypertension   . 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 Albertine Patricia  . Sleep apnea 2007    status post polysomnogram 2007 , suggested use of CPAP  . Ventricular hypertrophy 2006    a 2-D echo in 2006, ejection fraction 55%, mild tricuspid regurgitation noted as well, 2-D echo November 13, 3472 showed diastolic dysfunction with LVEF normal of 65%   Social History   Socioeconomic History  . Marital status: Married    Spouse name: Not on file  . Number of children: Not on file  . Years of education: 42  . Highest education level: Not on file  Occupational History  . Not on file  Social Needs  . Financial resource strain: Not on file  . Food insecurity:    Worry: Not on file    Inability: Not on file  . Transportation needs:    Medical: Not on file    Non-medical: Not on file  Tobacco Use  . Smoking status: Former Smoker    Packs/day: 0.33    Years: 25.00    Pack years: 8.25    Last attempt to quit: 10/26/2005  Years since quitting: 12.9  . Smokeless tobacco: Never Used  Substance and Sexual Activity  . Alcohol use: No    Alcohol/week: 0.0 standard drinks  . Drug use: No  . Sexual activity: Not on file  Lifestyle  . Physical activity:    Days per week: Not on file    Minutes per session: Not on file  . Stress: Not on file  Relationships  . Social connections:    Talks on phone: Not on file    Gets together: Not on file    Attends religious service: Not on file    Active member of club or organization: Not on file    Attends meetings of clubs or organizations: Not on file    Relationship status: Not on file  Other Topics Concern  . Not on file  Social History Narrative   Lives with husband and two sons.    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   Completed to 11th grade.   Family History  Problem Relation Age of Onset  . Diabetes Mother   . Cancer Mother   . Diabetes Father   . Cancer Other        breast cancer died in her 69s  . Bipolar disorder Son   . Bipolar disorder Daughter   . Alcohol abuse Son   . Drug abuse Son   . Breast cancer Cousin   . Stroke Neg Hx    O:    Component Value Date/Time   CHOL 161 06/03/2017 1322   HDL 44 06/03/2017 1322   LDLCALC 89 06/03/2017 1322   TRIG 140 06/03/2017 1322   GLUCOSE 188 (H) 07/28/2018 1557   GLUCOSE 117 (H) 01/10/2015 1534   HGBA1C 5.9 (A) 08/29/2018 1432   HGBA1C 6.6 09/30/2010 1444   NA 141 07/28/2018 1557   K 4.4 07/28/2018 1557   CL 104 07/28/2018 1557   CO2 22 07/28/2018 1557   BUN 18 07/28/2018 1557   CREATININE 1.71 (H) 07/28/2018 1557   CREATININE 1.11 (H) 01/10/2015 1534   CALCIUM 9.8 07/28/2018 1557   GFRNONAA 32 (L) 07/28/2018 1557   GFRNONAA 55 (L) 01/10/2015 1534   GFRAA 36 (L) 07/28/2018 1557   GFRAA 63 01/10/2015 1534   AST 15 06/15/2018 1513   ALT 16 06/15/2018 1513   WBC 8.0 06/03/2017 1322   WBC 7.8 08/09/2014 1156   HGB 13.1 06/03/2017 1322   HCT 41.9 06/03/2017 1322   PLT 245 06/03/2017 1322   TSH 1.241 08/09/2014 1156   Ht Readings from Last 2 Encounters:  07/19/18 5' 2" (1.575 m)  06/24/18 5' 2" (1.575 m)   Wt Readings from Last 2 Encounters:  07/28/18 201 lb 14.4 oz (91.6 kg)  07/19/18 202 lb 3.2 oz (91.7 kg)   There is no height or weight on file to calculate BMI. BP Readings from Last 3 Encounters:  07/28/18 (!) 160/90  07/19/18 (!) 168/104  06/24/18 139/75    A/P: Kristy [name] Libre Results Downloaded and Reviewed  Freestyle Libre sensor placed 09/28/18   Week #2 of monitoring  12-day BG average 106, within target range 88% of the time, improved from 83% last week (past 7-day average approx 97%)  Above 180 mg/dL 2% of the time, highest around nighttime  Below 70 mg/dL 10% of  the time, lowest around morning, improved from 15% (past 7 days, hypoglycemia only around 1% of the time)  Patient correctly reports dose of insulin glargine (  40 units daily) and liraglutide 1.8 mg daily. No changes made today. Patient advised to schedule PCP appointment for Jan-Feb 2020.  An after visit summary was provided and patient advised to follow up in 2 months or sooner if any changes in condition or questions regarding medications arise.   The patient verbalized understanding of information provided by repeating back concepts discussed.   15 minutes spent face-to-face with the patient during the encounter. 50% of time spent on education. 50% of time was spent on assessment, plan, and coordination of care.

## 2018-10-13 NOTE — Patient Instructions (Signed)
Patient educated about medication as defined in this encounter and verbalized understanding by repeating back instructions provided.   

## 2018-10-13 NOTE — Addendum Note (Signed)
Addended by: Forde Dandy on: 10/13/2018 11:06 AM   Modules accepted: Level of Service

## 2018-10-17 DIAGNOSIS — I129 Hypertensive chronic kidney disease with stage 1 through stage 4 chronic kidney disease, or unspecified chronic kidney disease: Secondary | ICD-10-CM | POA: Diagnosis not present

## 2018-10-31 ENCOUNTER — Ambulatory Visit: Payer: Medicare Other | Admitting: Pharmacist

## 2018-10-31 DIAGNOSIS — E1142 Type 2 diabetes mellitus with diabetic polyneuropathy: Secondary | ICD-10-CM

## 2018-11-02 NOTE — Progress Notes (Signed)
S: Kristy [name] is a 63 y.o. female reports to clinical pharmacist appointment for help with diabetes management  No Known Allergies Medication Sig  ACCU-CHEK FASTCLIX LANCETS MISC Check blood sugar two times a day  aspirin 81 MG EC tablet Take 81 mg by mouth daily.    Blood Glucose Monitoring Suppl (ACCU-CHEK GUIDE) w/Device KIT 1 each by Does not apply route 2 (two) times daily.  carvedilol (COREG) 25 MG tablet Take 1 tablet (25 mg total) by mouth 2 (two) times daily.  Cholecalciferol 1000 units capsule Take 1 capsule (1,000 Units total) by mouth daily.  diclofenac sodium (VOLTAREN) 1 % GEL Apply 2 g topically 4 (four) times daily.  glucose blood (ACCU-CHEK GUIDE) test strip Check blood sugar two times a day  Insulin Glargine (LANTUS SOLOSTAR) 100 UNIT/ML Solostar Pen Inject 40 Units into the skin daily.  Insulin Pen Needle (B-D UF III MINI PEN NEEDLES) 31G X 5 MM MISC 1 Device by Does not apply route 3 (three) times daily before meals. Check daily 3X before meals  Insulin Syringe-Needle U-100 (INSULIN SYRINGE .5CC/31GX5/16") 31G X 5/16" 0.5 ML MISC Inject 1 Dose into the skin daily. The patient is insulin requiring, ICD 10 code E11.42. Insulin 1 time daily  liraglutide (VICTOZA) 18 MG/3ML SOPN Inject 0.3 mLs (1.8 mg total) into the skin daily.  losartan (COZAAR) 50 MG tablet Take 50 mg by mouth daily.  metFORMIN (GLUCOPHAGE XR) 500 MG 24 hr tablet Take 1 tablet (500 mg total) by mouth 2 (two) times daily with a meal.  rosuvastatin (CRESTOR) 20 MG tablet Take 1 tablet (20 mg total) by mouth daily.  spironolactone (ALDACTONE) 50 MG tablet Take 1 tablet (50 mg total) by mouth daily.  traMADol (ULTRAM) 50 MG tablet Take 2 tablets (100 mg total) by mouth every 12 (twelve) hours as needed.  verapamil (CALAN) 80 MG tablet Take 1 tablet (80 mg total) by mouth 3 (three) times daily.   Past Medical History:  Diagnosis Date  . Colon polyps   . Colon polyps   . Diabetes mellitus   .  Diverticulosis   . Hyperlipidemia   . Hypertension   . 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 Albertine Patricia  . Sleep apnea 2007    status post polysomnogram 2007 , suggested use of CPAP  . Ventricular hypertrophy 2006    a 2-D echo in 2006, ejection fraction 55%, mild tricuspid regurgitation noted as well, 2-D echo November 13, 6710 showed diastolic dysfunction with LVEF normal of 65%   Social History   Socioeconomic History  . Marital status: Married    Spouse name: Not on file  . Number of children: Not on file  . Years of education: 49  . Highest education level: Not on file  Occupational History  . Not on file  Social Needs  . Financial resource strain: Not on file  . Food insecurity:    Worry: Not on file    Inability: Not on file  . Transportation needs:    Medical: Not on file    Non-medical: Not on file  Tobacco Use  . Smoking status: Former Smoker    Packs/day: 0.33    Years: 25.00    Pack years: 8.25    Last attempt to quit: 10/26/2005  Years since quitting: 13.0  . Smokeless tobacco: Never Used  Substance and Sexual Activity  . Alcohol use: No    Alcohol/week: 0.0 standard drinks  . Drug use: No  . Sexual activity: Not on file  Lifestyle  . Physical activity:    Days per week: Not on file    Minutes per session: Not on file  . Stress: Not on file  Relationships  . Social connections:    Talks on phone: Not on file    Gets together: Not on file    Attends religious service: Not on file    Active member of club or organization: Not on file    Attends meetings of clubs or organizations: Not on file    Relationship status: Not on file  Other Topics Concern  . Not on file  Social History Narrative   Lives with husband and two sons.    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   Completed to 11th grade.   Family History  Problem Relation Age of Onset  . Diabetes Mother   . Cancer Mother   . Diabetes Father   . Cancer Other        breast cancer died in her 58s  . Bipolar disorder Son   . Bipolar disorder Daughter   . Alcohol abuse Son   . Drug abuse Son   . Breast cancer Cousin   . Stroke Neg Hx    O:    Component Value Date/Time   CHOL 161 06/03/2017 1322   HDL 44 06/03/2017 1322   LDLCALC 89 06/03/2017 1322   TRIG 140 06/03/2017 1322   GLUCOSE 188 (H) 07/28/2018 1557   GLUCOSE 117 (H) 01/10/2015 1534   HGBA1C 5.9 (A) 08/29/2018 1432   HGBA1C 6.6 09/30/2010 1444   NA 141 07/28/2018 1557   K 4.4 07/28/2018 1557   CL 104 07/28/2018 1557   CO2 22 07/28/2018 1557   BUN 18 07/28/2018 1557   CREATININE 1.71 (H) 07/28/2018 1557   CREATININE 1.11 (H) 01/10/2015 1534   CALCIUM 9.8 07/28/2018 1557   GFRNONAA 32 (L) 07/28/2018 1557   GFRNONAA 55 (L) 01/10/2015 1534   GFRAA 36 (L) 07/28/2018 1557   GFRAA 63 01/10/2015 1534   AST 15 06/15/2018 1513   ALT 16 06/15/2018 1513   WBC 8.0 06/03/2017 1322   WBC 7.8 08/09/2014 1156   HGB 13.1 06/03/2017 1322   HCT 41.9 06/03/2017 1322   PLT 245 06/03/2017 1322   TSH 1.241 08/09/2014 1156   Ht Readings from Last 2 Encounters:  07/19/18 '5\' 2"'$  (1.575 m)  06/24/18 '5\' 2"'$  (1.575 m)   Wt Readings from Last 2 Encounters:  07/28/18 201 lb 14.4 oz (91.6 kg)  07/19/18 202 lb 3.2 oz (91.7 kg)   There is no height or weight on file to calculate BMI. BP Readings from Last 3 Encounters:  07/28/18 (!) 160/90  07/19/18 (!) 168/104  06/24/18 139/75    A/P: Patient reports for follow up with diabetes medication management. She follows with nephrology for renal/HTN management. She is currently taking metformin 500 mg BID, insulin glargine 40 units daily, and liraglutide 1.8 mg daily. No issues or concerns reported today. A1C at goal and DM panel up to date. Patient is  awaiting approval of patient assistance application, samples of insulin and liraglutide provided today.  An after visit summary was provided and patient advised to follow up if any changes in condition or  questions regarding medications arise.   The patient verbalized understanding of information provided by repeating back concepts discussed.

## 2018-11-22 ENCOUNTER — Other Ambulatory Visit: Payer: Self-pay | Admitting: Internal Medicine

## 2018-11-22 DIAGNOSIS — F1190 Opioid use, unspecified, uncomplicated: Secondary | ICD-10-CM

## 2018-11-25 ENCOUNTER — Other Ambulatory Visit: Payer: Self-pay | Admitting: Internal Medicine

## 2018-11-25 ENCOUNTER — Telehealth: Payer: Self-pay | Admitting: Dietician

## 2018-11-25 NOTE — Telephone Encounter (Signed)
Pt calling checking on medicine refill; pt contact [phone]   traMADol (ULTRAM) 50 MG tablet

## 2018-11-25 NOTE — Telephone Encounter (Signed)
Kristy [name] came in looking for a refill on her Victoza and Lantus insulin. We only had Victoza so she was given one sample pen.   Kristy [name], RD 11/25/2018 2:57 PM.

## 2018-11-25 NOTE — Telephone Encounter (Signed)
Rx refill has been pending for 2 days. Message sent to PCP to address. Hubbard Hartshorn, RN, BSN

## 2018-12-07 ENCOUNTER — Ambulatory Visit: Payer: Medicare Other | Admitting: Pharmacist

## 2018-12-07 ENCOUNTER — Ambulatory Visit (INDEPENDENT_AMBULATORY_CARE_PROVIDER_SITE_OTHER): Payer: Medicare Other | Admitting: Internal Medicine

## 2018-12-07 ENCOUNTER — Encounter: Payer: Self-pay | Admitting: Internal Medicine

## 2018-12-07 ENCOUNTER — Other Ambulatory Visit: Payer: Self-pay

## 2018-12-07 VITALS — BP 174/101 | HR 90 | Temp 98.1°F | Ht 62.0 in | Wt 204.6 lb

## 2018-12-07 DIAGNOSIS — Z794 Long term (current) use of insulin: Secondary | ICD-10-CM

## 2018-12-07 DIAGNOSIS — Z79891 Long term (current) use of opiate analgesic: Secondary | ICD-10-CM | POA: Diagnosis not present

## 2018-12-07 DIAGNOSIS — E1142 Type 2 diabetes mellitus with diabetic polyneuropathy: Secondary | ICD-10-CM

## 2018-12-07 DIAGNOSIS — M1711 Unilateral primary osteoarthritis, right knee: Secondary | ICD-10-CM

## 2018-12-07 DIAGNOSIS — I10 Essential (primary) hypertension: Secondary | ICD-10-CM

## 2018-12-07 DIAGNOSIS — F1190 Opioid use, unspecified, uncomplicated: Secondary | ICD-10-CM

## 2018-12-07 DIAGNOSIS — Z79899 Other long term (current) drug therapy: Secondary | ICD-10-CM

## 2018-12-07 LAB — POCT GLYCOSYLATED HEMOGLOBIN (HGB A1C): Hemoglobin A1C: 6.1 % — AB (ref 4.0–5.6)

## 2018-12-07 LAB — GLUCOSE, CAPILLARY: Glucose-Capillary: 115 mg/dL — ABNORMAL HIGH (ref 70–99)

## 2018-12-07 MED ORDER — TRAMADOL HCL 50 MG PO TABS: 100.0000 mg | ORAL_TABLET | Freq: Four times a day (QID) | ORAL | 2 refills | Status: DC | PRN

## 2018-12-07 NOTE — Patient Instructions (Addendum)
It was nice seeing you today. Thank you for choosing [facility] for your Primary Care.   Today we talked about:  1) I resent Tramadol to your pharmacy prescribed as 100mg  every 6 hours as needed. Please tell your kidney doctor about this.  2) Keep up the good work with your sugars and diabetes! I will resend the combo diabetes medicine and see if your insurance will cover it.    FOLLOW-UP INSTRUCTIONS When: 6 months For: diabetes, a1c What to bring: all medications   Please contact the clinic if you have any problems, or need to be seen sooner.

## 2018-12-07 NOTE — Progress Notes (Signed)
Patient was seen today in a co-visit, no medication changes today. See Dr. Cristal Generous note for details.

## 2018-12-07 NOTE — Assessment & Plan Note (Signed)
Discussed pain management for right knee OA. Voltaren gel has been helping. Tylenol does not help. She has been using tramadol as sparingly as possible. Some days only needing 50mg  bid but other days needing 100mg  TID. She understands that we are trying to prevent further kidney damage. She has requested I change the prescription from 100mg  bid to 100mg  q6h. Kidney function has been stable. Her BP is elevated today but she did not take her medicines this morning because they make her sleepy and she didn't know if she'd have someone else to drive her to her appt today.   Plan: - increase tramadol prescription, instructed her to use it sparingly - has f/u with nephrologist in one week, instructed her to discuss tramadol use at that appt as well.

## 2018-12-07 NOTE — Progress Notes (Signed)
   CC: diabetes  HPI:  [name] is a 63 y.o. female with PMHx as listed below who presents for routine follow up.   Please see encounter tab for full details of HPI  Past Medical History:  Diagnosis Date  . Colon polyps   . Colon polyps   . Diabetes mellitus   . Diverticulosis   . Hyperlipidemia   . Hypertension   . 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 Albertine Patricia  . Sleep apnea 2007    status post polysomnogram 2007 , suggested use of CPAP  . Ventricular hypertrophy 2006    a 2-D echo in 2006, ejection fraction 55%, mild tricuspid regurgitation noted as well, 2-D echo November 13, 2949 showed diastolic dysfunction with LVEF normal of 65%    Physical Exam:  Vitals:   12/07/18 1346  BP: (!) 174/101  Pulse: 90  Temp: 98.1 F (36.7 C)  TempSrc: Oral  SpO2: 99%  Weight: 204 lb 9.6 oz (92.8 kg)  Height: 5\' 2"  (1.575 m)   Gen: well appearing female, NAD Pulm: CTAB Cardiac: RRR Ext: no LEE  Assessment & Plan:   See Encounters Tab for problem based charting.  Patient discussed with Dr. Eppie Gibson

## 2018-12-07 NOTE — Assessment & Plan Note (Signed)
Taking lantus 40U daily, victoza 1.8mg  daily and metformin 500mg  bid. Has been seen in clinic for the CGM project and insulin was slightly decreased. She denies symptomatic hypoglycemia and she reports an average CBG of 115. She does not have her glucometer with her today.   Plan: - a1c today: 6.1 (improved from 8.5) - f/u 6 months

## 2018-12-08 NOTE — Progress Notes (Signed)
Case discussed with Dr. Donne Hazel at the time of the visit. We reviewed the resident's history and exam and pertinent patient test results. I agree with the assessment, diagnosis, and plan of care documented in the resident's note.  We failed to address her hypertension today.  At the follow-up visit we will specifically address the hypertension and likely will recommend escalation of her antihypertensive regimen.

## 2018-12-15 ENCOUNTER — Other Ambulatory Visit: Payer: Self-pay | Admitting: Oncology

## 2018-12-15 DIAGNOSIS — F1190 Opioid use, unspecified, uncomplicated: Secondary | ICD-10-CM

## 2018-12-19 ENCOUNTER — Other Ambulatory Visit: Payer: Self-pay | Admitting: Internal Medicine

## 2018-12-19 DIAGNOSIS — Z1231 Encounter for screening mammogram for malignant neoplasm of breast: Secondary | ICD-10-CM

## 2018-12-23 ENCOUNTER — Encounter: Payer: Self-pay | Admitting: Pharmacist

## 2018-12-23 NOTE — Progress Notes (Signed)
Samples of Victoza and Tyler Aas provided to patient. Will also look into [employer] since patient did not qualify for other programs.

## 2019-01-09 ENCOUNTER — Telehealth: Payer: Self-pay | Admitting: Pharmacist

## 2019-01-09 DIAGNOSIS — E1142 Type 2 diabetes mellitus with diabetic polyneuropathy: Secondary | ICD-10-CM

## 2019-01-09 MED ORDER — GLUCOSE BLOOD VI STRP: ORAL_STRIP | 5 refills | Status: DC

## 2019-01-09 NOTE — Progress Notes (Signed)
Patient called requesting refill on test strips, prescription sent.

## 2019-01-30 ENCOUNTER — Ambulatory Visit: Payer: Medicare Other

## 2019-01-30 ENCOUNTER — Encounter: Payer: Self-pay | Admitting: Pharmacist

## 2019-01-31 NOTE — Progress Notes (Addendum)
Patient was given liraglutide samples. Lot [id number] Exp 02/2020 Qty: 3 pen Instructions: 1.8 mg daily  And tresiba Lot [id number] Exp 11/21 Qty: 2 vials Instructions: 40 units daily

## 2019-02-13 ENCOUNTER — Other Ambulatory Visit: Payer: Self-pay | Admitting: Internal Medicine

## 2019-02-13 DIAGNOSIS — F1190 Opioid use, unspecified, uncomplicated: Secondary | ICD-10-CM

## 2019-03-06 ENCOUNTER — Other Ambulatory Visit: Payer: Self-pay | Admitting: Internal Medicine

## 2019-03-06 DIAGNOSIS — F1190 Opioid use, unspecified, uncomplicated: Secondary | ICD-10-CM

## 2019-03-13 ENCOUNTER — Ambulatory Visit
Admission: RE | Admit: 2019-03-13 | Discharge: 2019-03-13 | Disposition: A | Discharge status: Home / Self Care | Payer: Medicare Other | Source: Ambulatory Visit | Attending: Ophthalmology | Admitting: Ophthalmology

## 2019-03-13 ENCOUNTER — Other Ambulatory Visit: Payer: Self-pay

## 2019-03-13 DIAGNOSIS — Z1231 Encounter for screening mammogram for malignant neoplasm of breast: Secondary | ICD-10-CM

## 2019-03-22 ENCOUNTER — Other Ambulatory Visit: Payer: Self-pay | Admitting: Internal Medicine

## 2019-03-22 DIAGNOSIS — F1190 Opioid use, unspecified, uncomplicated: Secondary | ICD-10-CM

## 2019-03-23 NOTE — Telephone Encounter (Signed)
Last appt was 12/07/18.

## 2019-04-10 ENCOUNTER — Other Ambulatory Visit: Payer: Self-pay

## 2019-04-10 ENCOUNTER — Ambulatory Visit: Payer: Medicare Other | Admitting: Pharmacist

## 2019-04-10 DIAGNOSIS — E1142 Type 2 diabetes mellitus with diabetic polyneuropathy: Secondary | ICD-10-CM

## 2019-04-10 NOTE — Progress Notes (Addendum)
Medication Samples have been provided to the patient.  Ozempic was substituted for Trulicity due to samples supply (will switch back to Ozempic at next visit)  Trulicity dulaglutide 1.5mg /0.14mL October 2021 [id number]  Qty: 2 boxes w/ 2 pens each, inject 1.5 mg into skin once weekly  Drug: Insulin degludec Tyler Aas) Strength: 100 units/mL Qty: 4 LOT: [id number] Exp.Date: 06/2020 Dosing instructions: 40 units daily  The patient has been instructed regarding the correct time, dose, and frequency of taking this medication, including desired effects and most common side effects. Patient advised to contact clinic if any questions arise and verbalized understanding by repeat back.  Kristy [name] 6:33 PM 04/10/2019    The patient has been instructed regarding the correct time, dose, and frequency of taking this medication, including desired effects and most common side effects. Patient advised to contact clinic if any questions arise and verbalized understanding by repeat back.  Kristy [name] 6:30 PM 04/10/2019

## 2019-04-10 NOTE — Addendum Note (Signed)
Addended by: Forde Dandy on: 04/10/2019 06:33 PM   Modules accepted: Orders

## 2019-04-11 ENCOUNTER — Other Ambulatory Visit: Payer: Self-pay | Admitting: Internal Medicine

## 2019-04-11 DIAGNOSIS — F1190 Opioid use, unspecified, uncomplicated: Secondary | ICD-10-CM

## 2019-04-11 NOTE — Telephone Encounter (Signed)
Thank you :)

## 2019-04-11 NOTE — Telephone Encounter (Signed)
Can you put her on for a telehealth visit with Dr. Annie Paras today or tomorrow morning to discuss further?  Thanks

## 2019-04-11 NOTE — Telephone Encounter (Signed)
Received refill request from pt's pharmacy for tramadol.  Rx last written on 03/23/19 #60 by attending MD see note below that was copied and pasted from a previous encounter..    Mar 23, 2019 Aldine Contes, MD to Isabelle Course, MD . Ebbie Latus, RN . Imp Triage Nurse Pool    10:43 AM Will refill *1. However, patient appears to be filling this prescription every 15-20 days and will need to discuss with PCP prior to further refills   PCP is not available at this time-will send to attending for review, please advise.Regenia Skeeter, Darlene Cassady6/16/202011:14 AM

## 2019-04-11 NOTE — Telephone Encounter (Signed)
Patient scheduled for telehealth visit tomorrow at 0945. Best number [phone]. Hubbard Hartshorn, RN, BSN

## 2019-04-12 ENCOUNTER — Encounter: Payer: Self-pay | Admitting: Internal Medicine

## 2019-04-12 ENCOUNTER — Ambulatory Visit (INDEPENDENT_AMBULATORY_CARE_PROVIDER_SITE_OTHER): Payer: Medicare Other | Admitting: Internal Medicine

## 2019-04-12 ENCOUNTER — Other Ambulatory Visit: Payer: Self-pay

## 2019-04-12 DIAGNOSIS — F1190 Opioid use, unspecified, uncomplicated: Secondary | ICD-10-CM

## 2019-04-12 DIAGNOSIS — M170 Bilateral primary osteoarthritis of knee: Secondary | ICD-10-CM | POA: Diagnosis not present

## 2019-04-12 DIAGNOSIS — Z79891 Long term (current) use of opiate analgesic: Secondary | ICD-10-CM | POA: Diagnosis not present

## 2019-04-12 DIAGNOSIS — G8929 Other chronic pain: Secondary | ICD-10-CM

## 2019-04-12 MED ORDER — TRAMADOL HCL 50 MG PO TABS: ORAL_TABLET | ORAL | 0 refills | Status: DC

## 2019-04-12 NOTE — Assessment & Plan Note (Signed)
HPI: [name] is a 63 y.o. female with the medical conditions listed below who was called for a televisit regarding her chronic use of tramadol and tramadol refill request. The patient has been on tramadol for bilateral knee OA since 2014. Her dose was recently increased in February from 50mg  BID to 100mg  q6hrs. She states her pain is well-controlled on this dose, and that her pain is severe when she doesn't take the tramadol regularly. She has four pills left and is requesting a refill today. Please see problem based charting for the assessment and plan.   Assessment: PDMP reviewed. Patient is refilling a tramadol prescription for 60 tablets of 50mg  twice per month. It seems she is using less than 100g q6hrs as this would be 240 pills per month and she is only receiving about 120 pills per month. Her last refill was 5/28. Refill today is appropriate. Renal function has been stable enough for current dosing. She has a f/u with nephrology in two days.   Plan - Refill tramadol today 50mg  #60 tablets - Consider change in chronic pain therapy at next visit with PCP

## 2019-04-12 NOTE — Progress Notes (Signed)
   This is a telephone encounter between [facility] and Kristy [name] on 04/12/2019 for chronic opioid use. The visit was conducted with the patient located at home and [name] Home. The patient's identity was confirmed using their DOB and current address. The patient has consented to being evaluated through a telephone encounter and understands the associated risks/benefits. I personally spent 6 minutes on medical discussion.   HPI:   [name] is a 63 y.o. female with the medical conditions listed below who was called for a televisit regarding her chronic use of tramadol and tramadol refill request. The patient has been on tramadol for bilateral knee OA since 2014. Her dose was recently increased in February from 50mg  BID to 100mg  q6hrs. She states her pain is well-controlled on this dose, and that her pain is severe when she doesn't take the tramadol regularly. She has four pills left and is requesting a refill today. Please see problem based charting for the assessment and plan.   Past Medical History:  Diagnosis Date  . Colon polyps   . Colon polyps   . Diabetes mellitus   . Diverticulosis   . Hyperlipidemia   . Hypertension   . 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 Albertine Patricia  . Sleep apnea 2007    status post polysomnogram 2007 , suggested use of CPAP  . Ventricular hypertrophy 2006    a 2-D echo in 2006, ejection fraction 55%, mild tricuspid regurgitation noted as well, 2-D echo November 13, 1153 showed diastolic dysfunction with LVEF normal of 65%    Review of Systems:   Pertinent positives mentioned in HPI. Remainder of all ROS negative.   Assessment & Plan:   Patient discussed with Dr. Angelia Mould

## 2019-04-13 ENCOUNTER — Other Ambulatory Visit: Payer: Self-pay

## 2019-04-13 NOTE — Patient Outreach (Signed)
[facility]) Care Management  04/13/2019  Kristy [name] 1955/12/31 [id number]   Medication Adherence call to [place] spoke with patient she is past due on Metformin Er 500 mg and Rosuvastatin 20 mg patient explain she is taking 1 tablet daily on both medications she has a few and ask if we can call [facility] to [facility]. [name] is showing past due under [place].   [place] Management Direct Dial [phone]  Fax [phone] [email or web address]

## 2019-04-15 ENCOUNTER — Encounter: Payer: Self-pay | Admitting: *Deleted

## 2019-04-17 NOTE — Progress Notes (Signed)
Internal Medicine Clinic Attending  Case discussed with Dr. Dorrell at the time of the visit.  We reviewed the resident's history and exam and pertinent patient test results.  I agree with the assessment, diagnosis, and plan of care documented in the resident's note.    

## 2019-05-01 ENCOUNTER — Other Ambulatory Visit: Payer: Self-pay | Admitting: *Deleted

## 2019-05-01 DIAGNOSIS — F1190 Opioid use, unspecified, uncomplicated: Secondary | ICD-10-CM

## 2019-05-01 MED ORDER — TRAMADOL HCL 50 MG PO TABS: ORAL_TABLET | ORAL | 3 refills | Status: DC

## 2019-05-01 NOTE — Telephone Encounter (Signed)
Attempted to call in refill, however, pharmacy is closed till 2 PM. Will call back later. Hubbard Hartshorn, RN, BSN

## 2019-05-01 NOTE — Telephone Encounter (Signed)
Can you please phone in?  Thank you

## 2019-05-01 NOTE — Telephone Encounter (Signed)
Tramadol Rx called to [facility] at [facility]. Hubbard Hartshorn, RN, BSN

## 2019-05-08 ENCOUNTER — Encounter: Payer: Self-pay | Admitting: Pharmacist

## 2019-05-08 NOTE — Progress Notes (Addendum)
Medication Samples for patient (coordinating with attending physician and triage nurse)  Drug name: Trulicity (dulaglutide)       Strength: 1.5 mg        Qty: 2 boxes (4 pens)  LOT: [id number]  Exp.Date: 04/2020 Dosing instructions: Inject 1.5 mg into skin once weekly  Drug name: Tyler Aas (insulin degludec)       Strength: 100 units/mL        Qty: 2 boxes (4 pens)  LOT: [id number]  Exp.Date: 04/2020 Dosing instructions: Inject 40 units into skin daily  The patient has been instructed regarding the correct time, dose, and frequency of taking this medication, including desired effects and most common side effects.   Flossie Dibble 3:57 PM 05/08/2019

## 2019-05-09 NOTE — Progress Notes (Signed)
Patient's daughter, Ninfa Linden, picked up these medication samples from Dr. Heber Burton. Hubbard Hartshorn, RN, BSN

## 2019-05-15 ENCOUNTER — Telehealth: Payer: Self-pay | Admitting: Pharmacist

## 2019-05-15 DIAGNOSIS — E1142 Type 2 diabetes mellitus with diabetic polyneuropathy: Secondary | ICD-10-CM

## 2019-05-15 NOTE — Progress Notes (Signed)
Patient did not qualify for Medicare Extra Help Program, referred to [facility] for help with cost of Ozempic and [place].

## 2019-05-23 NOTE — Addendum Note (Signed)
Addended by: Hulan Fray on: 05/23/2019 07:13 PM   Modules accepted: Orders

## 2019-05-26 ENCOUNTER — Other Ambulatory Visit: Payer: Self-pay

## 2019-05-26 NOTE — Patient Outreach (Signed)
[facility]) Care Management  05/26/2019  [name] 1955/12/08 [id number]   Medication Adherence call to Kristy [name] Hippa Identifiers Verify spoke with patient she is past due on Losartan 100 mg patient explain she had a death in her family and has not had a chance to order it patient ask if we can call [facility] an order this medication. [facility] will have it ready for patient to pick up.Kristy [name] is showing past due under [place].   [facility] Management Direct Dial [phone]  Fax [phone] [name]

## 2019-06-08 ENCOUNTER — Ambulatory Visit (INDEPENDENT_AMBULATORY_CARE_PROVIDER_SITE_OTHER): Payer: Medicare Other | Admitting: Internal Medicine

## 2019-06-08 ENCOUNTER — Encounter: Payer: Self-pay | Admitting: Internal Medicine

## 2019-06-08 ENCOUNTER — Other Ambulatory Visit: Payer: Self-pay

## 2019-06-08 VITALS — BP 165/111 | HR 103 | Temp 98.3°F | Ht 62.0 in | Wt 203.1 lb

## 2019-06-08 DIAGNOSIS — E1142 Type 2 diabetes mellitus with diabetic polyneuropathy: Secondary | ICD-10-CM

## 2019-06-08 DIAGNOSIS — M79662 Pain in left lower leg: Secondary | ICD-10-CM | POA: Diagnosis not present

## 2019-06-08 DIAGNOSIS — N183 Chronic kidney disease, stage 3 unspecified: Secondary | ICD-10-CM

## 2019-06-08 DIAGNOSIS — E1122 Type 2 diabetes mellitus with diabetic chronic kidney disease: Secondary | ICD-10-CM

## 2019-06-08 DIAGNOSIS — Z9181 History of falling: Secondary | ICD-10-CM

## 2019-06-08 DIAGNOSIS — E785 Hyperlipidemia, unspecified: Secondary | ICD-10-CM

## 2019-06-08 DIAGNOSIS — R600 Localized edema: Secondary | ICD-10-CM | POA: Insufficient documentation

## 2019-06-08 DIAGNOSIS — I129 Hypertensive chronic kidney disease with stage 1 through stage 4 chronic kidney disease, or unspecified chronic kidney disease: Secondary | ICD-10-CM

## 2019-06-08 DIAGNOSIS — M7989 Other specified soft tissue disorders: Secondary | ICD-10-CM

## 2019-06-08 DIAGNOSIS — Z794 Long term (current) use of insulin: Secondary | ICD-10-CM

## 2019-06-08 DIAGNOSIS — I150 Renovascular hypertension: Secondary | ICD-10-CM

## 2019-06-08 DIAGNOSIS — Z79899 Other long term (current) drug therapy: Secondary | ICD-10-CM

## 2019-06-08 LAB — POCT GLYCOSYLATED HEMOGLOBIN (HGB A1C): Hemoglobin A1C: 6 % — AB (ref 4.0–5.6)

## 2019-06-08 LAB — GLUCOSE, CAPILLARY: Glucose-Capillary: 139 mg/dL — ABNORMAL HIGH (ref 70–99)

## 2019-06-08 MED ORDER — TRULICITY 1.5 MG/0.5ML ~~LOC~~ SOAJ: 1.5000 mg | SUBCUTANEOUS | 1 refills | Status: DC

## 2019-06-08 NOTE — Patient Instructions (Signed)
Thank you, [place] for allowing [place] to provide your care today. Today we discussed hypertension and diabetes.    I have ordered cholesterol and BMP labs for you. I will call if any are abnormal.    I have place a referrals to None   I have ordered the following tests: Ultrasound of left lower extremity  We changes the following medications: Continue taking medication as prescribed.  I have given you a 2-week sample of Trulicity.  Please inject this medication once a week.  Please follow-up in 2 weeks.    Should you have any questions or concerns please call the internal medicine clinic at [phone].    Marianna Payment, D.O. [facility]

## 2019-06-08 NOTE — Assessment & Plan Note (Signed)
Patient admits to a 1 month history of leg swelling.  He states that initially occurred after tripping over her door frame and falling down.  She states that she had no direct trauma to the left leg during this time.  The swelling initially subsided but then recurred 2 weeks ago after traveling to [place] to go shopping with her sister.  The swelling has been constant over the last 2 weeks.  She admits to pain along the deep vein perfusion.  Well score of 3.  Plan: - I ordered a Doppler ultrasound of the patient's left lower extremity to rule out deep vein thrombosis.

## 2019-06-08 NOTE — Assessment & Plan Note (Addendum)
Patient's blood pressure was 165/111 today.  She is currently taking several medications for hypertension and states that her blood pressure usually runs about 140/95 at home .  I am not to make any changes to her blood pressure management today and asked the patient to follow-up in 2 weeks with daily blood pressure readings so we can make some adjustments at that time.

## 2019-06-08 NOTE — Assessment & Plan Note (Addendum)
Most recent hemoglobin A1c was 6.0.  Currently taking metformin 062 mg, Trulicity 1.5 mg per 0.5 mL, and glargine 40 units daily.  It is difficult to determine if the patient's A1c is accurate due to chronic kidney disease.  I counseled the patient on increasing daily blood glucose checks for the next 2 weeks.  We will follow-up in 2 weeks to see if the patient needs to adjust her diabetes medication.  Patient has been receiving Trulicity and glargine samples from our clinic.  Patient says she is already had her eye exam this year and had her foot exam today.  plan: -I was able to give the patient a 2-week supply of Trulicity samples. -We do not have any samples for glargine.  I will contact Dr. Maudie Mercury regarding financial assistance for this patient. -Continue taking metformin as prescribed

## 2019-06-08 NOTE — Assessment & Plan Note (Signed)
Patient saw Dr. Moshe Cipro for CKD helping to control blood pressure.

## 2019-06-08 NOTE — Progress Notes (Addendum)
   CC: Left lower extremity swelling  HPI:  [name] is a 63 y.o. female with a past medical history stated bellow and presented today with a one-month history of left lower extremity swelling. Please see problem based assessment and plan for additional details.   Past Medical History:  Diagnosis Date  . Colon polyps   . Colon polyps   . Diabetes mellitus   . Diverticulosis   . Hyperlipidemia   . Hypertension   . 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 Albertine Patricia  . Sleep apnea 2007    status post polysomnogram 2007 , suggested use of CPAP  . Ventricular hypertrophy 2006    a 2-D echo in 2006, ejection fraction 55%, mild tricuspid regurgitation noted as well, 2-D echo November 14, 979 showed diastolic dysfunction with LVEF normal of 65%     Review of Systems: Review of Systems  Constitutional: Negative for chills, fever, malaise/fatigue and weight loss.  Eyes: Negative for blurred vision and double vision.  Cardiovascular: Positive for leg swelling. Negative for chest pain and palpitations. Claudication: left.  Musculoskeletal: Positive for falls (1 month ago).       Left calf pain  Neurological:       Pathic pain in her legs     Vitals:   06/08/19 1438  BP: (!) 165/111  Pulse: (!) 103  Temp: 98.3 F (36.8 C)  TempSrc: Oral  SpO2: 100%  Weight: 203 lb 1.6 oz (92.1 kg)  Height: 5\' 2"  (1.575 m)     Physical Exam: Physical Exam  Constitutional: She is oriented to person, place, and time and well-developed, well-nourished, and in no distress.  Neck: Normal range of motion.  Cardiovascular: Normal rate, regular rhythm, normal heart sounds and intact distal pulses. Exam reveals no gallop and no friction rub.  No murmur heard. Pulmonary/Chest: Effort  normal and breath sounds normal. She exhibits no tenderness.  Musculoskeletal:        General: Tenderness and edema present.     Comments: Left lower extremity swelling compared to right.  Calf pain.   Neurological: She is alert and oriented to person, place, and time.  Skin: Skin is warm and dry.     Assessment & Plan:   See Encounters Tab for problem based charting.  .meds  Patient seen with Dr. Rebeca Alert

## 2019-06-08 NOTE — Addendum Note (Signed)
Addended by: Lawerance Cruel on: 06/08/2019 05:09 PM   Modules accepted: Orders

## 2019-06-09 LAB — LIPID PANEL
Chol/HDL Ratio: 3.4 ratio (ref 0.0–4.4)
Cholesterol, Total: 130 mg/dL (ref 100–199)
HDL: 38 mg/dL — ABNORMAL LOW (ref >39)
LDL Calculated: 63 mg/dL (ref 0–99)
Triglycerides: 145 mg/dL (ref 0–149)
VLDL Cholesterol Cal: 29 mg/dL (ref 5–40)

## 2019-06-09 LAB — BMP8+ANION GAP
Anion Gap: 14 mmol/L (ref 10.0–18.0)
BUN/Creatinine Ratio: 11 — ABNORMAL LOW (ref 12–28)
BUN: 21 mg/dL (ref 8–27)
CO2: 24 mmol/L (ref 20–29)
Calcium: 9.9 mg/dL (ref 8.7–10.3)
Chloride: 106 mmol/L (ref 96–106)
Creatinine, Ser: 1.89 mg/dL — ABNORMAL HIGH (ref 0.57–1.00)
GFR calc Af Amer: 32 mL/min/{1.73_m2} — ABNORMAL LOW (ref >59)
GFR calc non Af Amer: 28 mL/min/{1.73_m2} — ABNORMAL LOW (ref >59)
Glucose: 123 mg/dL — ABNORMAL HIGH (ref 65–99)
Potassium: 4.5 mmol/L (ref 3.5–5.2)
Sodium: 144 mmol/L (ref 134–144)

## 2019-06-12 ENCOUNTER — Other Ambulatory Visit: Payer: Self-pay | Admitting: Pharmacist

## 2019-06-12 DIAGNOSIS — E1142 Type 2 diabetes mellitus with diabetic polyneuropathy: Secondary | ICD-10-CM

## 2019-06-12 MED ORDER — TRULICITY 1.5 MG/0.5ML ~~LOC~~ SOAJ: 1.5000 mg | SUBCUTANEOUS | 2 refills | Status: DC

## 2019-06-12 MED ORDER — BASAGLAR KWIKPEN 100 UNIT/ML ~~LOC~~ SOPN: 40.0000 [IU] | PEN_INJECTOR | Freq: Every day | SUBCUTANEOUS | 2 refills | Status: DC

## 2019-06-12 NOTE — Progress Notes (Signed)
Internal Medicine Clinic Attending  I saw and evaluated the patient.  I personally confirmed the key portions of the history and exam documented by Dr. Marianna Payment and I reviewed pertinent patient test results.  The assessment, diagnosis, and plan were formulated together and I agree with the documentation in the resident's note.  Lenice Pressman, M.D., Ph.D.

## 2019-06-12 NOTE — Progress Notes (Signed)
Applying for [employer] patient assistance program for free access to [employer].

## 2019-06-14 ENCOUNTER — Ambulatory Visit (HOSPITAL_COMMUNITY)
Admission: RE | Admit: 2019-06-14 | Discharge: 2019-06-14 | Disposition: A | Discharge status: Home / Self Care | Payer: Medicare Other | Source: Ambulatory Visit | Attending: Student in an Organized Health Care Education/Training Program | Admitting: Student in an Organized Health Care Education/Training Program

## 2019-06-14 ENCOUNTER — Telehealth: Payer: Self-pay | Admitting: *Deleted

## 2019-06-14 ENCOUNTER — Telehealth: Payer: Self-pay | Admitting: Internal Medicine

## 2019-06-14 ENCOUNTER — Other Ambulatory Visit: Payer: Self-pay

## 2019-06-14 DIAGNOSIS — R600 Localized edema: Secondary | ICD-10-CM | POA: Diagnosis not present

## 2019-06-14 NOTE — Telephone Encounter (Signed)
[place] vascular lab calls, NEGATIVE FOR DVT, HEMATOMA NOTED IN LEFT CALF, POSSIBLY CAUSING SWELLING, PAIN- pt states she fell a few weeks ago striking that area

## 2019-06-14 NOTE — Telephone Encounter (Signed)
Call the patient regarding her ultrasound results.  Discussed with her that she has a hematoma that is likely causing her pain.  This will resolve with time.  States that she understands and agrees with the plan.

## 2019-06-14 NOTE — Progress Notes (Signed)
LLE venous duplex       has been completed. Preliminary results can be found under CV proc through chart review. June Leap, BS, RDMS, RVT    Called results to [facility].

## 2019-06-14 NOTE — Telephone Encounter (Signed)
Forwarding to Dr Marianna Payment

## 2019-06-21 ENCOUNTER — Other Ambulatory Visit: Payer: Self-pay

## 2019-06-21 ENCOUNTER — Encounter: Payer: Self-pay | Admitting: Internal Medicine

## 2019-06-21 ENCOUNTER — Ambulatory Visit (INDEPENDENT_AMBULATORY_CARE_PROVIDER_SITE_OTHER): Payer: Medicare Other | Admitting: Internal Medicine

## 2019-06-21 VITALS — BP 163/108 | HR 90 | Temp 98.1°F | Ht 62.0 in | Wt 203.0 lb

## 2019-06-21 DIAGNOSIS — N183 Chronic kidney disease, stage 3 unspecified: Secondary | ICD-10-CM

## 2019-06-21 DIAGNOSIS — I129 Hypertensive chronic kidney disease with stage 1 through stage 4 chronic kidney disease, or unspecified chronic kidney disease: Secondary | ICD-10-CM | POA: Diagnosis not present

## 2019-06-21 DIAGNOSIS — Z79899 Other long term (current) drug therapy: Secondary | ICD-10-CM

## 2019-06-21 DIAGNOSIS — E1122 Type 2 diabetes mellitus with diabetic chronic kidney disease: Secondary | ICD-10-CM

## 2019-06-21 DIAGNOSIS — I150 Renovascular hypertension: Secondary | ICD-10-CM

## 2019-06-21 DIAGNOSIS — Z7984 Long term (current) use of oral hypoglycemic drugs: Secondary | ICD-10-CM

## 2019-06-21 DIAGNOSIS — E1142 Type 2 diabetes mellitus with diabetic polyneuropathy: Secondary | ICD-10-CM

## 2019-06-21 MED ORDER — HYDROCHLOROTHIAZIDE 25 MG PO TABS: 25.0000 mg | ORAL_TABLET | Freq: Every day | ORAL | 0 refills | Status: DC

## 2019-06-21 MED ORDER — LOSARTAN POTASSIUM 100 MG PO TABS: 100.0000 mg | ORAL_TABLET | Freq: Every day | ORAL | Status: DC

## 2019-06-21 MED ORDER — METFORMIN HCL ER 500 MG PO TB24: 500.0000 mg | ORAL_TABLET | Freq: Every day | ORAL | 3 refills | Status: DC

## 2019-06-21 NOTE — Assessment & Plan Note (Signed)
Patient's diabetes is currently well controlled.  Considering her GFR is 33, I will decrease her metformin dosage to once daily

## 2019-06-21 NOTE — Patient Instructions (Signed)
Thank you, [place] for allowing [place] to provide your care today. Today we discussed hypertension.    I have ordered none today labs for you. I will call if any are abnormal.    I have place a referrals to none today   I have ordered the following tests: None today  I have ordered the following medication/changed the following medications: I have ordered hydrochlorothiazide 25 mg daily for her.  Please follow-up in like her to follow-up in 1 week in [facility] to review her medications.    Should you have any questions or concerns please call the internal medicine clinic at [phone].    Marianna Payment, D.O. [facility]

## 2019-06-21 NOTE — Assessment & Plan Note (Signed)
Patient's previous nephrology appointment from March 2020 states change in losartan to 100 mg daily continue the carvedilol 25 twice daily spironolactone 20 5 in the AM and 50 mg in the evening.  At today's visit the patient's blood pressure continues to remain significantly elevated the patient is not sure exactly which doses of medication she is currently on.  Next nephrology appointment is in 1 week.   Plan: -I will add hydrochlorothiazide 25 mg daily -Considering she is unsure about her medication dosage, I will bring her back in a week and a cc to review her medications.  If she can is still on 50 mg of losartan, then we will increase it to 100 mg daily

## 2019-06-27 ENCOUNTER — Telehealth: Payer: Self-pay

## 2019-06-27 DIAGNOSIS — E1142 Type 2 diabetes mellitus with diabetic polyneuropathy: Secondary | ICD-10-CM

## 2019-06-27 NOTE — Telephone Encounter (Signed)
Called [employer] for f/u on patient assistance application. Rep stated patient signatures and date were not legible, will refax today and f/u in 48h.   Brenton Grills, Student-PharmD 06/27/2019 1:47 PM

## 2019-06-28 ENCOUNTER — Ambulatory Visit (INDEPENDENT_AMBULATORY_CARE_PROVIDER_SITE_OTHER): Payer: Medicare Other | Admitting: Internal Medicine

## 2019-06-28 ENCOUNTER — Encounter: Payer: Self-pay | Admitting: Internal Medicine

## 2019-06-28 ENCOUNTER — Other Ambulatory Visit: Payer: Self-pay

## 2019-06-28 VITALS — BP 142/87 | HR 87 | Temp 98.0°F | Ht 62.0 in | Wt 204.5 lb

## 2019-06-28 DIAGNOSIS — E1122 Type 2 diabetes mellitus with diabetic chronic kidney disease: Secondary | ICD-10-CM | POA: Diagnosis not present

## 2019-06-28 DIAGNOSIS — G4733 Obstructive sleep apnea (adult) (pediatric): Secondary | ICD-10-CM

## 2019-06-28 DIAGNOSIS — I10 Essential (primary) hypertension: Secondary | ICD-10-CM

## 2019-06-28 DIAGNOSIS — N183 Chronic kidney disease, stage 3 (moderate): Secondary | ICD-10-CM | POA: Diagnosis not present

## 2019-06-28 DIAGNOSIS — Z23 Encounter for immunization: Secondary | ICD-10-CM

## 2019-06-28 DIAGNOSIS — I773 Arterial fibromuscular dysplasia: Secondary | ICD-10-CM

## 2019-06-28 DIAGNOSIS — I150 Renovascular hypertension: Secondary | ICD-10-CM

## 2019-06-28 DIAGNOSIS — I129 Hypertensive chronic kidney disease with stage 1 through stage 4 chronic kidney disease, or unspecified chronic kidney disease: Secondary | ICD-10-CM | POA: Diagnosis not present

## 2019-06-28 DIAGNOSIS — E1142 Type 2 diabetes mellitus with diabetic polyneuropathy: Secondary | ICD-10-CM | POA: Diagnosis not present

## 2019-06-28 DIAGNOSIS — Z79899 Other long term (current) drug therapy: Secondary | ICD-10-CM

## 2019-06-28 DIAGNOSIS — F329 Major depressive disorder, single episode, unspecified: Secondary | ICD-10-CM

## 2019-06-28 MED ORDER — AMLODIPINE-ATORVASTATIN 5-40 MG PO TABS: 1.0000 | ORAL_TABLET | Freq: Every day | ORAL | 3 refills | Status: DC

## 2019-06-28 MED ORDER — LOSARTAN POTASSIUM-HCTZ 100-25 MG PO TABS: 1.0000 | ORAL_TABLET | Freq: Every day | ORAL | 3 refills | Status: DC

## 2019-06-28 NOTE — Assessment & Plan Note (Signed)
The patient's blood pressure during this visit was 142/87. The patient is currently taking carvedilol 25mg  bid, hctz 25mg  qd, losartain 100mg  qd. His last blood pressure visits are   BP Readings from Last 3 Encounters:  06/28/19 (!) 142/87  06/21/19 (!) 163/108  06/08/19 (!) 165/111   The patient does not report palpitations, dizziness, chest pain, sob.  Assessment and Plan Amlodipine was not listed on patient's medication list. I have placed it on the list today. The patient should continue all other medications as listed above. Pharmacy spoke with patient during this visit and is working on getting patient medications at a low cost.

## 2019-06-28 NOTE — Assessment & Plan Note (Signed)
Dr. Maudie Mercury provided patient with samples today and she will start patient on xultophy once patient is approved for low cost medications.

## 2019-06-28 NOTE — Patient Instructions (Signed)
It was a pleasure to see you today Kristy [name]. You were seen today to verify your medications. I have updated your list. Please make sure to take all the medications listed on this form. We provided you with insulin samples today and will be sending you refills at low cost subsequently.   If you have any questions or concerns, please call our clinic at [phone] between 9am-5pm and after hours call [phone] and ask for the internal medicine resident on call. If you feel you are having a medical emergency please call 911.   Thank you, we look forward to help you remain healthy!  Lars Mage, MD Internal Medicine PGY3

## 2019-06-28 NOTE — Progress Notes (Signed)
   CC: Follow up hypertension and medication refill  HPI:  Kristy [name] is a 63 y.o. with fibromuscular dysplasia, osa, dm2, ckd3, major depressive disorder who presents for medication refill. Please see problem based charting for evaluation, assessment, and plan.  Past Medical History:  Diagnosis Date  . Colon polyps   . Colon polyps   . Diabetes mellitus   . Diverticulosis   . Hyperlipidemia   . Hypertension   . 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 Albertine Patricia  . Sleep apnea 2007    status post polysomnogram 2007 , suggested use of CPAP  . Ventricular hypertrophy 2006    a 2-D echo in 2006, ejection fraction 55%, mild tricuspid regurgitation noted as well, 2-D echo November 13, 2573 showed diastolic dysfunction with LVEF normal of 65%   Review of Systems:    Review of Systems  Constitutional: Negative for chills and fever.  Respiratory: Negative for cough and shortness of breath.   Cardiovascular: Negative for chest pain.  Gastrointestinal: Negative for abdominal pain, nausea and vomiting.  Neurological: Negative for dizziness and headaches.   Physical Exam:  Vitals:   06/28/19 1500  BP: (!) 142/87  Pulse: 87  Temp: 98 F (36.7 C)  TempSrc: Oral  SpO2: 99%  Weight: 204 lb 8 oz (92.8 kg)  Height: 5\' 2"  (1.575 m)   Physical Exam  Constitutional: Appears well-developed and well-nourished. No distress.  HENT:  Head: Normocephalic and atraumatic.  Eyes: Conjunctivae are normal.  Cardiovascular: Normal rate, regular rhythm and normal heart sounds.  Respiratory: Effort normal and breath sounds normal. No respiratory distress. No wheezes.  GI: Soft. Bowel sounds are normal. No distension. There is no tenderness.  Musculoskeletal: No edema.  Neurological:  Is alert.  Skin: Not diaphoretic. No erythema.  Psychiatric: Normal mood and affect. Behavior is normal. Judgment and thought content normal.   Assessment & Plan:   See Encounters Tab for problem based charting.  Patient discussed with Dr. Angelia Mould

## 2019-06-29 ENCOUNTER — Other Ambulatory Visit: Payer: Self-pay | Admitting: Internal Medicine

## 2019-06-29 DIAGNOSIS — F1190 Opioid use, unspecified, uncomplicated: Secondary | ICD-10-CM

## 2019-07-04 NOTE — Progress Notes (Signed)
Internal Medicine Clinic Attending  Case discussed with Dr. Chundi at the time of the visit.  We reviewed the resident's history and exam and pertinent patient test results.  I agree with the assessment, diagnosis, and plan of care documented in the resident's note. 

## 2019-07-04 NOTE — Telephone Encounter (Signed)
Called [employer] to follow up on [employer] application. Approved throught 10/26/2019. Shipment should be received by Outpatient Pharmacy in 12-14 business days. Next shipment cannot go out until 09/21/19, please send order before this date to ensure it is in queue as shipments for Medicare patients cannot go out after 09/25/19 and needs to be processed before 11/30. Patient will need to reapply after 10/27/2019 and all documents must be dated on or after 10/27/2019. [employer] Patient ID #[id number]  Brenton Grills, Student-PharmD 07/04/2019 11:42 AM

## 2019-07-05 NOTE — Telephone Encounter (Signed)
Pt requesting a call back [phone]

## 2019-07-06 MED ORDER — XULTOPHY 100-3.6 UNIT-MG/ML ~~LOC~~ SOPN: 40.0000 [IU] | PEN_INJECTOR | Freq: Every day | SUBCUTANEOUS | 2 refills | Status: DC

## 2019-07-06 NOTE — Addendum Note (Signed)
Addended by: Jodean Lima on: 07/06/2019 01:30 PM   Modules accepted: Level of Service

## 2019-07-11 NOTE — Addendum Note (Signed)
Addended by: Jodean Lima on: 07/11/2019 02:11 PM   Modules accepted: Level of Service

## 2019-07-11 NOTE — Progress Notes (Signed)
Internal Medicine Clinic Attending  I saw and evaluated the patient.  I personally confirmed the key portions of the history and exam documented by Dr. Marianna Payment and I reviewed pertinent patient test results.  The assessment, diagnosis, and plan were formulated together and I agree with the documentation in the resident's note.    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 Losartan was recently increased by her nephrologist but patient does not have her medications with her and is unsure of the dosage. Per last nephrology note, should be on losartan 100 mg daily, carvedilol 25 mg BID, and spironolactone 25 mg qAM and 50 mg qPM. BP today is 183/106. Agree with adding HCTZ with close follow up. May benefit from appointment with clinic pharmacist for medication review.

## 2019-07-21 LAB — HM DIABETES EYE EXAM

## 2019-07-24 ENCOUNTER — Encounter: Payer: Self-pay | Admitting: *Deleted

## 2019-07-25 ENCOUNTER — Other Ambulatory Visit: Payer: Self-pay | Admitting: Internal Medicine

## 2019-07-25 DIAGNOSIS — F1190 Opioid use, unspecified, uncomplicated: Secondary | ICD-10-CM

## 2019-07-31 NOTE — Telephone Encounter (Signed)
Pt here accompanying another patient to their visit-checking on the status of her refill request.  Will send request to attending pool for review.Regenia Skeeter, Darlene Cassady10/5/20203:07 PM   Last sent 07/05/19 Last seen 06/28/19 (ACC) and 06/21/19 (PCP)

## 2019-08-01 ENCOUNTER — Telehealth: Payer: Self-pay | Admitting: Internal Medicine

## 2019-08-01 NOTE — Telephone Encounter (Signed)
This has been addressed.

## 2019-08-01 NOTE — Telephone Encounter (Signed)
Pt requesting a call back about her Medication Refill. Pt states she has been waiting since 07/25/2019 and has not heard from anyone. Pt just called her pharmacy and it is still not ready.   traMADol (ULTRAM) 50 MG tablet  [facility] - [place] ([place]), [place] - [address]

## 2019-08-09 ENCOUNTER — Other Ambulatory Visit: Payer: Self-pay

## 2019-08-09 NOTE — Patient Outreach (Signed)
[facility]) Care Management  08/09/2019  Kristy [name] Aug 31, 1956 [id number]   Medication Adherence call to Kristy [name] Hippa Identifiers Verify spoke with patient she is past due on Metformin Er 500 mg patient explain she in only taking 1 tablet daily not 2 tablets daily patient has enough until her next appointment.Kristy [name] is showing past due under [name].   [facility] Management Direct Dial [phone]  Fax [phone] [email or web address]

## 2019-08-10 NOTE — Progress Notes (Signed)
   CC: HTN  HPI:  [name] is a 63 y.o. female with a past medical history stated below and presents today for HTN follow up. Please see problem based assessment and plan for additional details.    Past Medical History:  Diagnosis Date  . Colon polyps   . Colon polyps   . Diabetes mellitus   . Diverticulosis   . Hyperlipidemia   . Hypertension   . 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 Albertine Patricia  . Sleep apnea 2007    status post polysomnogram 2007 , suggested use of CPAP  . Ventricular hypertrophy 2006    a 2-D echo in 2006, ejection fraction 55%, mild tricuspid regurgitation noted as well, 2-D echo November 13, 5914 showed diastolic dysfunction with LVEF normal of 65%       Review of Systems: Review of Systems  Constitutional: Negative for chills, diaphoresis, fever, malaise/fatigue and weight loss.  Eyes: Negative for blurred vision and double vision.  Cardiovascular: Negative for chest pain and palpitations.  Gastrointestinal: Negative for abdominal pain, diarrhea and vomiting.  Genitourinary: Negative for dysuria.  Musculoskeletal: Negative for myalgias.     Vitals:   06/21/19 1411 06/21/19 1516  BP: (!) 183/106 (!) 163/108  Pulse: 91 90  Temp: 98.1 F (36.7 C)   TempSrc: Oral   SpO2: 100%   Weight: 203 lb (92.1 kg)   Height: 5\' 2"  (1.575 m)      Physical Exam: Physical Exam  Constitutional: She is oriented to person, place, and time and well-developed, well-nourished, and in no distress.  HENT:  Head: Atraumatic.  Eyes: EOM are normal.  Neck: Normal range of motion.  Cardiovascular: Normal rate, regular rhythm, normal heart sounds and intact distal pulses. Exam reveals no gallop and no friction rub.  No murmur heard.  Pulmonary/Chest: Effort normal and breath sounds normal. No respiratory distress. She exhibits no tenderness.  Abdominal: Soft. There is no abdominal tenderness.  Musculoskeletal: Normal range of motion.  Neurological: She is alert and oriented to person, place, and time.  Skin: Skin is warm and dry.     Assessment & Plan:   See Encounters Tab for problem based charting.  Patient seen with Dr. Philipp Ovens

## 2019-08-22 ENCOUNTER — Other Ambulatory Visit: Payer: Self-pay | Admitting: Internal Medicine

## 2019-08-22 DIAGNOSIS — F1190 Opioid use, unspecified, uncomplicated: Secondary | ICD-10-CM

## 2019-08-28 NOTE — Telephone Encounter (Signed)
Patient rtn call about her Medication Refill sent on 08/22/2019.    traMADol (ULTRAM) 50 MG tablet  [facility] - [place] ([place] - [address]

## 2019-08-29 NOTE — Telephone Encounter (Signed)
Received a 22-month supply in July and also a month supply and September and in October.  Therefore, she should have plenty of tramadol refills.

## 2019-08-30 ENCOUNTER — Telehealth: Payer: Self-pay | Admitting: *Deleted

## 2019-08-30 ENCOUNTER — Telehealth: Payer: Self-pay

## 2019-08-30 DIAGNOSIS — F1190 Opioid use, unspecified, uncomplicated: Secondary | ICD-10-CM

## 2019-08-30 DIAGNOSIS — E1142 Type 2 diabetes mellitus with diabetic polyneuropathy: Secondary | ICD-10-CM

## 2019-08-30 MED ORDER — TRAMADOL HCL 50 MG PO TABS: 100.0000 mg | ORAL_TABLET | Freq: Four times a day (QID) | ORAL | 0 refills | Status: DC | PRN

## 2019-08-30 NOTE — Telephone Encounter (Signed)
Because the scripts do not say #60 must last 30 days pharmacy has filled this way 7/6 #60 w/ 3 refills 7/6 #60 7/26 #60 8/8 # 60 Script written 9/6 filled 9/6 #60 Written 10/6 10/6 #60  Dr Marianna Payment please add to the script this is a 30 day supply unless you are good with script being filled in less than 30 days  Sending to dr's butcher and coe

## 2019-08-30 NOTE — Telephone Encounter (Addendum)
Dr. Marianna Payment is on vacation this week and hopefully not checking his in basket.  Her last contract is 2014 and was for 90 tramadol every 30 days.  If my calculations are correct, she has gotten 300 pills since early July which works out to 63 pills/month.  This is under her contract quantity.  Therefore, I will refill.  However she needs an appointment ASAP with Dr. Marianna Payment to discuss pain and update her pain contract. I reviewed the [place] narcotic database report.

## 2019-08-30 NOTE — Telephone Encounter (Signed)
Requesting to speak with a nurse about traMADol (ULTRAM) 50 MG tablet. Please call pt back.  

## 2019-08-30 NOTE — Telephone Encounter (Signed)
appt made for pt 12/10 at 19 w/ pcp dr Marianna Payment

## 2019-09-25 ENCOUNTER — Other Ambulatory Visit: Payer: Self-pay

## 2019-09-25 ENCOUNTER — Ambulatory Visit (INDEPENDENT_AMBULATORY_CARE_PROVIDER_SITE_OTHER): Payer: Medicare Other | Admitting: Internal Medicine

## 2019-09-25 DIAGNOSIS — M7122 Synovial cyst of popliteal space [Baker], left knee: Secondary | ICD-10-CM

## 2019-09-25 DIAGNOSIS — M25562 Pain in left knee: Secondary | ICD-10-CM | POA: Insufficient documentation

## 2019-09-25 DIAGNOSIS — Z79891 Long term (current) use of opiate analgesic: Secondary | ICD-10-CM | POA: Diagnosis not present

## 2019-09-25 DIAGNOSIS — G8929 Other chronic pain: Secondary | ICD-10-CM

## 2019-09-25 DIAGNOSIS — Z96651 Presence of right artificial knee joint: Secondary | ICD-10-CM | POA: Diagnosis not present

## 2019-09-25 MED ORDER — IBUPROFEN 600 MG PO TABS: 600.0000 mg | ORAL_TABLET | Freq: Three times a day (TID) | ORAL | 1 refills | Status: DC | PRN

## 2019-09-25 NOTE — Assessment & Plan Note (Signed)
HPI:  Patient presents to the clinic with L knee pain. Patient states that her pain has been continuing for several months. The pain is located in the posterior aspect of her knee. She describes her pain as sharp, with some radiation toward her calf.  Patient states that her pain is 10/10 currently. Standing and walking aggravate her pain. A home dose of tramadol alleviated her pain to a 6/10. She denies an event like this occurring before.Overall, her pain has been so severe that she has stayed in bed since 08/20/2019.   Assessment:  Patient reports to the clinic with posterior LLE pain. Upon inspection, her lower extremities are symmetrical bilaterally. There was no evidence of effusion, erythema, swelling or tenderness along the joint lines. Patient's Homan's sign was negative. Patient does have a PMH for bilateral knee osteoarthritis, and had a RLE total knee replacement 7 years ago. It was suspected at the time of the interview that a likely etiology was a Baker's cyst, or tendonopathy. And ultrasound of the affected extremity showed a popliteal cyst2 cm below the skin.   Plan:  - Advil 600 TID PRN  - Patient instructed on taking medications as needed.  - Patient instructed that if she begins to develop fevers, myalgias, or other signs of infection to call the clinic for reevaluation.

## 2019-09-25 NOTE — Progress Notes (Signed)
CC: Left Knee Pain    HPI:  [name] is a 63 y.o. , with a PMH noted below, presents to the clinic for her left knee pain. To see the management of her acute and chronic conditions, please see the assessment and plan note under the encounters tab.   Past Medical History:  Diagnosis Date  . Colon polyps   . Colon polyps   . Diabetes mellitus   . Diverticulosis   . Hyperlipidemia   . Hypertension   . 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 Albertine Patricia  . Sleep apnea 2007    status post polysomnogram 2007 , suggested use of CPAP  . Ventricular hypertrophy 2006    a 2-D echo in 2006, ejection fraction 55%, mild tricuspid regurgitation noted as well, 2-D echo November 13, 2421 showed diastolic dysfunction with LVEF normal of 65%   Review of Systems:   Review of Systems  Constitutional: Negative for chills, fever, malaise/fatigue and weight loss.  Eyes: Negative for blurred vision.  Cardiovascular: Negative for chest pain.  Gastrointestinal: Negative for abdominal pain, blood in stool, constipation, diarrhea, melena, nausea and vomiting.  Musculoskeletal: Negative for back pain, myalgias and neck pain.       Patient with complaints of posterior knee pain.   Neurological: Negative for dizziness, tingling, tremors and headaches.    Physical Exam:  Vitals:   09/25/19 1355  BP: 124/90  Pulse: 99  Temp: 99.4 F (37.4 C)  TempSrc: Oral  SpO2: 99%  Weight: 200 lb 12.8 oz (91.1 kg)  Physical Exam Vitals signs and nursing note reviewed.  Constitutional:      General: She is not in acute distress.    Appearance: Normal appearance. She is not ill-appearing or toxic-appearing.     Comments: Patient sitting comfortably in wheelchair, no acute distress noted.   HENT:      Head: Normocephalic and atraumatic.  Eyes:     General:        Right eye: No discharge.        Left eye: No discharge.     Conjunctiva/sclera: Conjunctivae normal.  Cardiovascular:     Rate and Rhythm: Normal rate and regular rhythm.     Pulses: Normal pulses.     Heart sounds: Normal heart sounds. No murmur. No friction rub. No gallop.   Pulmonary:     Effort: Pulmonary effort is normal.     Breath sounds: Normal breath sounds. No wheezing, rhonchi or rales.  Abdominal:     General: Bowel sounds are normal.     Palpations: Abdomen is soft.     Tenderness: There is no abdominal tenderness. There is no guarding.  Musculoskeletal: Normal range of motion.        General: No swelling.     Right lower leg: No edema.     Left lower leg: No edema.     Comments: LLE with no erythema, swelling. Non tender to palpation along the joint lines. Homan's sign is negative. Lower extremities symmetrically similar with no increase in swelling along the LLE.   Skin:    Coloration: Skin is not jaundiced.     Findings: No bruising or erythema.  Neurological:     General: No focal deficit present.     Mental Status:  She is alert and oriented to person, place, and time.  Psychiatric:        Mood and Affect: Mood normal.        Behavior: Behavior normal.     Assessment & Plan:   See Encounters Tab for problem based charting.  Patient seen with Dr. Evette Doffing

## 2019-09-25 NOTE — Patient Instructions (Addendum)
Kristy [name],  It was a pleasure meeting you today. Today we discussed your knee pain. After looking at your ultrasound we found an inflamed fluid filled pouch (bursa), commonly called a "Baker's cyst." We will give you Advil 600 mg which you can take 3 times a day, as needed. Please reach out if you notice any new fevers, chills, are general decline in your health. Please follow up with your primary care provider next month. I hope you have a pleasant day! Sincerely,  Maudie Mercury, MD

## 2019-09-27 ENCOUNTER — Other Ambulatory Visit: Payer: Self-pay | Admitting: Internal Medicine

## 2019-09-27 DIAGNOSIS — F1190 Opioid use, unspecified, uncomplicated: Secondary | ICD-10-CM

## 2019-09-27 NOTE — Progress Notes (Signed)
Internal Medicine Clinic Attending  I saw and evaluated the patient.  I personally confirmed the key portions of the history and exam documented by Dr. Gilford Rile and I reviewed pertinent patient test results.  The assessment, diagnosis, and plan were formulated together and I agree with the documentation in the resident's note.  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 On ultrasound, it is moderate in size, not ruptured. We talked about the risk of spontaneous rupture, treatment is supportive with NSAIDs. Follow up as needed for management of the underlying osteoarthritis of the left knee.

## 2019-10-05 ENCOUNTER — Encounter: Payer: Self-pay | Admitting: Internal Medicine

## 2019-10-05 ENCOUNTER — Ambulatory Visit (INDEPENDENT_AMBULATORY_CARE_PROVIDER_SITE_OTHER): Payer: Medicare Other | Admitting: Internal Medicine

## 2019-10-05 ENCOUNTER — Other Ambulatory Visit: Payer: Self-pay

## 2019-10-05 VITALS — BP 128/82 | HR 89 | Temp 98.0°F | Ht 62.0 in | Wt 200.5 lb

## 2019-10-05 DIAGNOSIS — E1142 Type 2 diabetes mellitus with diabetic polyneuropathy: Secondary | ICD-10-CM | POA: Diagnosis not present

## 2019-10-05 DIAGNOSIS — Z79899 Other long term (current) drug therapy: Secondary | ICD-10-CM

## 2019-10-05 DIAGNOSIS — Z794 Long term (current) use of insulin: Secondary | ICD-10-CM | POA: Diagnosis not present

## 2019-10-05 DIAGNOSIS — I150 Renovascular hypertension: Secondary | ICD-10-CM

## 2019-10-05 LAB — POCT GLYCOSYLATED HEMOGLOBIN (HGB A1C): Hemoglobin A1C: 6.3 % — AB (ref 4.0–5.6)

## 2019-10-05 LAB — GLUCOSE, CAPILLARY: Glucose-Capillary: 129 mg/dL — ABNORMAL HIGH (ref 70–99)

## 2019-10-05 MED ORDER — ACCU-CHEK FASTCLIX LANCETS MISC: 5 refills | Status: DC

## 2019-10-05 MED ORDER — ACCU-CHEK GUIDE VI STRP: ORAL_STRIP | 5 refills | Status: DC

## 2019-10-05 NOTE — Progress Notes (Signed)
   CC: DM and HTN  HPI:  Ms.Estelene Dilan Fullenwider is a 63 y.o. female with PMHx listed below presenting for DM and HTN. Please see the A&P for the status of the patient's chronic medical problems.  Past Medical History:  Diagnosis Date  . Colon polyps   . Colon polyps   . Diabetes mellitus   . Diverticulosis   . Hyperlipidemia   . Hypertension   . 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 Albertine Patricia  . Sleep apnea 2007    status post polysomnogram 2007 , suggested use of CPAP  . Ventricular hypertrophy 2006    a 2-D echo in 2006, ejection fraction 55%, mild tricuspid regurgitation noted as well, 2-D echo November 13, 1957 showed diastolic dysfunction with LVEF normal of 65%   Review of Systems:  Performed and all others negative.  Physical Exam: Vitals:   10/05/19 1410  BP: 128/82  Pulse: 89  Temp: 98 F (36.7 C)  TempSrc: Oral  SpO2: 97%  Weight: 200 lb 8 oz (90.9 kg)   General: Well nourished female in no acute distress Pulm: Good air movement with no wheezing or crackles  CV: RRR, no murmurs, no rubs   Assessment & Plan:   See Encounters Tab for problem based charting.  Patient discussed with Dr. Dareen Piano

## 2019-10-05 NOTE — Patient Instructions (Signed)
Thank you for allowing me to provide your care. You're doing a great job with your health. Please continue all your medications as prescribed. We will check hemoglobin A1c on your way out and I will call you if we need to make any changes to her medications.  Please come back to see Dr. Marianna Payment in three months or sooner if any issues arise.

## 2019-10-06 NOTE — Assessment & Plan Note (Signed)
Patient with well controlled HTN. She is currently on Amlodipine 5 mg QD, Carvedilol 25 mg BID, Losartan 100 mg QD, HCTZ 25 mg QD, and spironolactone 50 mg QD. She is tolerating all these medications well without apparent side effects. Denies orthostatic changes.   She had a BMP in 05/2019 that showed stable renal function and normal electrolytes.   A/P: - Well controlled  - Continue  Amlodipine 5 mg QD, Carvedilol 25 mg BID, Losartan 100 mg QD, HCTZ 25 mg QD, and spironolactone 50 mg QD

## 2019-10-06 NOTE — Assessment & Plan Note (Signed)
Patient with DM currently on Xultophy 40 units QD and Metformin 500 mg QD. She is tolerating these medications well. She did not bring her meter today but states her CBGs are typically around 100 when she checks. She continues to avoid high carb foods and tries to walk. Denies signs or symptoms of hypoglycemia.   A/P: - A1c 6.3 today. She is at goal  - Continue Xultophy 40 units QD and Metformin 500 mg QD. - Refills for lancets and strips sent in  - Continue atorvastatin 40 mg QD and Losartan 100 mg QD - Foot exam performed

## 2019-10-09 NOTE — Progress Notes (Signed)
Internal Medicine Clinic Attending  Case discussed with Dr. Helberg at the time of the visit.  We reviewed the resident's history and exam and pertinent patient test results.  I agree with the assessment, diagnosis, and plan of care documented in the resident's note.    

## 2019-10-23 ENCOUNTER — Other Ambulatory Visit: Payer: Self-pay | Admitting: Internal Medicine

## 2019-10-23 DIAGNOSIS — F1190 Opioid use, unspecified, uncomplicated: Secondary | ICD-10-CM

## 2019-10-23 NOTE — Telephone Encounter (Signed)
Next appt scheduled 01/04/20 with PCP.

## 2019-10-24 ENCOUNTER — Other Ambulatory Visit: Payer: Self-pay | Admitting: Internal Medicine

## 2019-10-24 DIAGNOSIS — I10 Essential (primary) hypertension: Secondary | ICD-10-CM

## 2019-10-24 NOTE — Telephone Encounter (Signed)
Needs refill on   spironolactone (ALDACTONE) 50 MG tablet(Expired)   carvedilol (COREG) 25 MG tablet traMADol (ULTRAM) 50 MG tablet   ;pt contact [place] ([place]), [place] - [address]

## 2019-10-28 MED ORDER — SPIRONOLACTONE 50 MG PO TABS: 50.0000 mg | ORAL_TABLET | Freq: Every day | ORAL | 3 refills | Status: DC

## 2019-10-28 MED ORDER — CARVEDILOL 25 MG PO TABS: 25.0000 mg | ORAL_TABLET | Freq: Two times a day (BID) | ORAL | 3 refills | Status: DC

## 2019-11-08 ENCOUNTER — Other Ambulatory Visit: Payer: Self-pay | Admitting: Student in an Organized Health Care Education/Training Program

## 2019-11-08 DIAGNOSIS — F1190 Opioid use, unspecified, uncomplicated: Secondary | ICD-10-CM

## 2019-11-10 ENCOUNTER — Other Ambulatory Visit: Payer: Self-pay | Admitting: Student in an Organized Health Care Education/Training Program

## 2019-11-10 DIAGNOSIS — F1190 Opioid use, unspecified, uncomplicated: Secondary | ICD-10-CM

## 2019-11-14 ENCOUNTER — Other Ambulatory Visit: Payer: Self-pay | Admitting: Internal Medicine

## 2019-11-14 DIAGNOSIS — F1190 Opioid use, unspecified, uncomplicated: Secondary | ICD-10-CM

## 2019-11-14 NOTE — Telephone Encounter (Signed)
Pt wanted to know why denied her traMADol (ULTRAM) 50 MG tablet; pt contact [phone]

## 2019-11-14 NOTE — Telephone Encounter (Signed)
Called pt- stated last refilled Tramadol on 12/30 for 60 tabs and she takes 2 tabs every 6 hrs; now she's totally out of med.  Thanks

## 2019-11-15 MED ORDER — TRAMADOL HCL 50 MG PO TABS: ORAL_TABLET | ORAL | 0 refills | Status: DC

## 2019-11-16 NOTE — Telephone Encounter (Signed)
Pt called / informed of refill.Very appreciative. °

## 2019-12-04 ENCOUNTER — Other Ambulatory Visit: Payer: Self-pay | Admitting: Pharmacist

## 2019-12-04 MED ORDER — XULTOPHY 100-3.6 UNIT-MG/ML ~~LOC~~ SOPN: 40.0000 [IU] | PEN_INJECTOR | Freq: Every day | SUBCUTANEOUS | 2 refills | Status: DC

## 2019-12-04 NOTE — Addendum Note (Signed)
Addended by: Forde Dandy on: 12/04/2019 02:25 PM   Modules accepted: Orders

## 2019-12-05 ENCOUNTER — Other Ambulatory Visit: Payer: Self-pay | Admitting: Internal Medicine

## 2019-12-05 DIAGNOSIS — F1190 Opioid use, unspecified, uncomplicated: Secondary | ICD-10-CM

## 2019-12-18 ENCOUNTER — Other Ambulatory Visit: Payer: Self-pay | Admitting: Internal Medicine

## 2019-12-18 DIAGNOSIS — F1190 Opioid use, unspecified, uncomplicated: Secondary | ICD-10-CM

## 2019-12-18 NOTE — Telephone Encounter (Signed)
This is not on pt's med list but in med hx. States dr Marianna Payment sent script in last week Please add to med list, explain

## 2019-12-29 DIAGNOSIS — N183 Chronic kidney disease, stage 3 unspecified: Secondary | ICD-10-CM | POA: Diagnosis not present

## 2019-12-29 DIAGNOSIS — I129 Hypertensive chronic kidney disease with stage 1 through stage 4 chronic kidney disease, or unspecified chronic kidney disease: Secondary | ICD-10-CM | POA: Diagnosis not present

## 2019-12-29 DIAGNOSIS — E1122 Type 2 diabetes mellitus with diabetic chronic kidney disease: Secondary | ICD-10-CM | POA: Diagnosis not present

## 2019-12-29 DIAGNOSIS — E1129 Type 2 diabetes mellitus with other diabetic kidney complication: Secondary | ICD-10-CM | POA: Diagnosis not present

## 2019-12-29 DIAGNOSIS — I701 Atherosclerosis of renal artery: Secondary | ICD-10-CM | POA: Diagnosis not present

## 2020-01-01 ENCOUNTER — Telehealth: Payer: Self-pay | Admitting: Internal Medicine

## 2020-01-01 NOTE — Telephone Encounter (Signed)
Refill Request-Pt only has 1 more dose left and will be completely out.  Insulin Degludec-Liraglutide (XULTOPHY) 100-3.6 UNIT-MG/ML SOPN  [facility] - [place], [place] - [address].

## 2020-01-01 NOTE — Telephone Encounter (Signed)
Called op pharm, we are calling [facility] to search for med, it should have been sent to op pharm

## 2020-01-01 NOTE — Telephone Encounter (Signed)
Called [facility], hold for 40 mins, spoke to rep, stated that it was signed for 3/2 at op pharm, if it is lost or never arrived, will need form filled out he is faxing and a letter from physician stating it was never rec'd or lost. Spoke w/ steve and he will replace the xultophy and await re delivery of xultophy

## 2020-01-02 MED FILL — XULTOPHY 100 UNIT-3.6MG/ML: 100-3.6 | 150 days supply | Qty: 60 | Fill #0

## 2020-01-02 NOTE — Telephone Encounter (Signed)
Pt will pick up this am

## 2020-01-04 ENCOUNTER — Ambulatory Visit (INDEPENDENT_AMBULATORY_CARE_PROVIDER_SITE_OTHER): Payer: Medicare Other | Admitting: Internal Medicine

## 2020-01-04 ENCOUNTER — Encounter: Payer: Self-pay | Admitting: Internal Medicine

## 2020-01-04 ENCOUNTER — Other Ambulatory Visit: Payer: Self-pay

## 2020-01-04 VITALS — BP 141/91 | HR 90 | Temp 98.3°F | Ht 62.0 in | Wt 210.3 lb

## 2020-01-04 DIAGNOSIS — I150 Renovascular hypertension: Secondary | ICD-10-CM

## 2020-01-04 DIAGNOSIS — E1142 Type 2 diabetes mellitus with diabetic polyneuropathy: Secondary | ICD-10-CM | POA: Diagnosis not present

## 2020-01-04 DIAGNOSIS — I10 Essential (primary) hypertension: Secondary | ICD-10-CM

## 2020-01-04 DIAGNOSIS — I701 Atherosclerosis of renal artery: Secondary | ICD-10-CM

## 2020-01-04 DIAGNOSIS — Z794 Long term (current) use of insulin: Secondary | ICD-10-CM | POA: Diagnosis not present

## 2020-01-04 DIAGNOSIS — I773 Arterial fibromuscular dysplasia: Secondary | ICD-10-CM

## 2020-01-04 DIAGNOSIS — Z87891 Personal history of nicotine dependence: Secondary | ICD-10-CM

## 2020-01-04 LAB — POCT GLYCOSYLATED HEMOGLOBIN (HGB A1C): Hemoglobin A1C: 5.9 % — AB (ref 4.0–5.6)

## 2020-01-04 LAB — GLUCOSE, CAPILLARY: Glucose-Capillary: 117 mg/dL — ABNORMAL HIGH (ref 70–99)

## 2020-01-04 NOTE — Progress Notes (Signed)
CC: HTN  HPI:  [name] is a 64 y.o. female with a past medical history stated below and presents today for HTN. Please see problem based assessment and plan for additional details.  Current Outpatient Medications on File Prior to Visit  Medication Sig Dispense Refill  . Accu-Chek FastClix Lancets MISC Check blood sugar two times a day 102 each 5  . amLODipine-atorvastatin (CADUET) 5-40 MG tablet Take 1 tablet by mouth daily. 90 tablet 3  . aspirin 81 MG EC tablet Take 81 mg by mouth daily.      . Blood Glucose Monitoring Suppl (ACCU-CHEK GUIDE) w/Device KIT 1 each by Does not apply route 2 (two) times daily. 1 kit 1  . carvedilol (COREG) 25 MG tablet Take 1 tablet (25 mg total) by mouth 2 (two) times daily. 180 tablet 3  . Cholecalciferol 1000 units capsule Take 1 capsule (1,000 Units total) by mouth daily. 30 capsule 2  . diclofenac sodium (VOLTAREN) 1 % GEL Apply 2 g topically 4 (four) times daily. 1 Tube 5  . glucose blood (ACCU-CHEK GUIDE) test strip Check blood sugar two times a day 100 each 5  . ibuprofen (ADVIL) 600 MG tablet Take 1 tablet (600 mg total) by mouth every 8 (eight) hours as needed for moderate pain. 30 tablet 1  . Insulin Degludec-Liraglutide (XULTOPHY) 100-3.6 UNIT-MG/ML SOPN Inject 40 Units into the skin daily. 16 pen 2  . Insulin Pen Needle (B-D UF III MINI PEN NEEDLES) 31G X 5 MM MISC 1 Device by Does not apply route 3 (three) times daily before meals. Check daily 3X before meals 100 each 12  . Insulin Syringe-Needle U-100 (INSULIN SYRINGE .5CC/31GX5/16") 31G X 5/16" 0.5 ML MISC Inject 1 Dose into the skin daily. The patient is insulin requiring, ICD 10 code E11.42. Insulin 1 time daily 100 each 3  . losartan-hydrochlorothiazide (HYZAAR) 100-25 MG tablet Take 1 tablet by mouth daily. 90 tablet 3  . metFORMIN (GLUCOPHAGE XR) 500 MG 24 hr tablet Take 1 tablet (500 mg total) by mouth daily. 180 tablet 3  . spironolactone (ALDACTONE) 50 MG tablet Take 1 tablet  (50 mg total) by mouth daily. 90 tablet 3  . traMADol (ULTRAM) 50 MG tablet TAKE 2 TABLETS BY MOUTH EVERY 6 HOURS AS NEEDED FOR SEVERE PAIN 75 tablet 0   No current facility-administered medications on file prior to visit.     Past Medical History:  Diagnosis Date  . Colon polyps   . Colon polyps   . Diabetes mellitus   . Diverticulosis   . Hyperlipidemia   . Hypertension   . 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 Albertine Patricia  . Sleep apnea 2007    status post polysomnogram 2007 , suggested use of CPAP  . Ventricular hypertrophy 2006    a 2-D echo in 2006, ejection fraction 55%, mild tricuspid regurgitation noted as well, 2-D echo November 13, 6312 showed diastolic dysfunction with LVEF normal of 65%    Family History  Problem Relation Age of Onset  . Diabetes Mother   . Cancer Mother   . Diabetes Father   . Cancer Other        breast cancer died in her 10s  . Bipolar disorder Son   . Bipolar disorder Daughter   .  Alcohol abuse Son   . Drug abuse Son   . Breast cancer Cousin   . Stroke Neg Hx      Social History   Socioeconomic History  . Marital status: Married    Spouse name: Not on file  . Number of children: Not on file  . Years of education: 71  . Highest education level: Not on file  Occupational History  . Not on file  Tobacco Use  . Smoking status: Former Smoker    Packs/day: 0.33    Years: 25.00    Pack years: 8.25    Quit date: 10/26/2005    Years since quitting: 14.2  . Smokeless tobacco: Never Used  Substance and Sexual Activity  . Alcohol use: No    Alcohol/week: 0.0 standard drinks  . Drug use: No  . Sexual activity: Not on file  Other Topics Concern  . Not on file  Social History Narrative   Lives with husband and two sons.    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   Completed to 11th grade.   Social Determinants of Health   Financial Resource Strain:   . Difficulty of Paying Living Expenses:   Food Insecurity:   . Worried About Charity fundraiser in the Last Year:   . Arboriculturist in the Last Year:   Transportation Needs:   . Film/video editor (Medical):   Marland Kitchen Lack of Transportation (Non-Medical):   Physical Activity:   . Days of Exercise per Week:   . Minutes of Exercise per Session:   Stress:   . Feeling of Stress :   Social Connections:   . Frequency of Communication with Friends and Family:   . Frequency of Social Gatherings with Friends and Family:   . Attends Religious Services:   . Active Member of Clubs or Organizations:   . Attends Archivist Meetings:   Marland Kitchen Marital Status:   Intimate Partner Violence:   . Fear of Current or Ex-Partner:   . Emotionally Abused:   Marland Kitchen Physically Abused:   . Sexually Abused:       Review of Systems: ROS - All review of systems are negative except what is noted on the HPI.    Vitals:   01/04/20 1401  BP: (!) 146/93  Pulse: 100  Temp: 98.3 F (36.8 C)  TempSrc: Oral  SpO2: 100%  Weight: 210 lb 4.8 oz (95.4 kg)  Height: '5\' 2"'$  (1.575 m)     Physical Exam: Physical Exam  Constitutional: She is oriented to person, place, and time and well-developed, well-nourished, and in no distress.  HENT:  Head: Normocephalic and atraumatic.  Eyes: EOM are normal.  Cardiovascular: Normal rate and intact distal pulses.  Pulmonary/Chest: Effort normal and breath sounds normal.  Abdominal: Soft. Bowel sounds are normal.  Musculoskeletal:        General: Normal range of motion.  Neurological: She is alert and oriented to person, place, and time.  Skin: Skin is warm and dry.     Assessment & Plan:   See Encounters Tab for problem based charting.  Patient discussed with Dr. Rebeca Alert

## 2020-01-04 NOTE — Patient Instructions (Signed)
Thank you, [place] for allowing [place] to provide your care today. Today we discussed Hypertension, Diabetes, Fibromuscular dysplasia.    I have ordered none labs for you. I will call if any are abnormal.    I have place a referrals to none.   I have ordered the following tests: Renal US   I have ordered the following medication/changed the following medications: none   Please follow-up in 4 weeks.    Should you have any questions or concerns please call the internal medicine clinic at [phone].    Marianna Payment, D.O. [facility]

## 2020-01-08 ENCOUNTER — Encounter: Payer: Self-pay | Admitting: Internal Medicine

## 2020-01-08 NOTE — Assessment & Plan Note (Signed)
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  She had a stent placed several years ago and has not had routine surveillance. Considering the patient has pretty resistant hypertension despite multiple antihypertensive medications, I will repeat a vascular ultrasound of her renal arteries to rule out worsening fibromuscular dysplasia.   Plan: -Ordered renal artery ultrasound today.

## 2020-01-08 NOTE — Assessment & Plan Note (Signed)
Patient's history of diabetes.  Her most recent A1c was 5.9.  Is taking 40 units daily of degludec-liraglutide.  She denies any symptoms of hypoglycemia.  I counseled her on my concerns for low blood sugars in the setting of pretty significant drop in her hemoglobin A1c.  She states that she will test her blood glucose 3-4 times daily and follow-up in 1 month for further evaluation management of her diabetes.  Plan: -Reevaluate diabetes in 1 month. -Told patient to get yearly eye exam -I will reevaluate her feet in 4 weeks.

## 2020-01-09 NOTE — Progress Notes (Signed)
Internal Medicine Clinic Attending  Case discussed with Dr. Coe at the time of the visit.  We reviewed the resident's history and exam and pertinent patient test results.  I agree with the assessment, diagnosis, and plan of care documented in the resident's note.  Shellene Sweigert, M.D., Ph.D.  

## 2020-01-11 ENCOUNTER — Other Ambulatory Visit: Payer: Self-pay | Admitting: Internal Medicine

## 2020-01-11 DIAGNOSIS — F1190 Opioid use, unspecified, uncomplicated: Secondary | ICD-10-CM

## 2020-01-16 ENCOUNTER — Ambulatory Visit (HOSPITAL_COMMUNITY)
Admission: RE | Admit: 2020-01-16 | Discharge: 2020-01-16 | Disposition: A | Discharge status: Home / Self Care | Payer: Medicare Other | Source: Ambulatory Visit | Attending: Internal Medicine | Admitting: Internal Medicine

## 2020-01-16 ENCOUNTER — Other Ambulatory Visit: Payer: Self-pay

## 2020-01-16 DIAGNOSIS — I10 Essential (primary) hypertension: Secondary | ICD-10-CM | POA: Diagnosis not present

## 2020-01-16 DIAGNOSIS — E1142 Type 2 diabetes mellitus with diabetic polyneuropathy: Secondary | ICD-10-CM | POA: Diagnosis not present

## 2020-01-16 DIAGNOSIS — I773 Arterial fibromuscular dysplasia: Secondary | ICD-10-CM

## 2020-01-16 NOTE — Progress Notes (Signed)
Renal artery duplex completed. Refer to "CV Proc" under chart review to view preliminary results.  01/16/2020 10:03 AM Kelby Aline., MHA, RVT, RDCS, RDMS

## 2020-02-01 ENCOUNTER — Other Ambulatory Visit: Payer: Self-pay | Admitting: Internal Medicine

## 2020-02-01 ENCOUNTER — Ambulatory Visit (INDEPENDENT_AMBULATORY_CARE_PROVIDER_SITE_OTHER): Payer: Medicare Other | Admitting: Internal Medicine

## 2020-02-01 DIAGNOSIS — Z1231 Encounter for screening mammogram for malignant neoplasm of breast: Secondary | ICD-10-CM

## 2020-02-01 DIAGNOSIS — E1142 Type 2 diabetes mellitus with diabetic polyneuropathy: Secondary | ICD-10-CM

## 2020-02-01 DIAGNOSIS — Z794 Long term (current) use of insulin: Secondary | ICD-10-CM

## 2020-02-01 DIAGNOSIS — I773 Arterial fibromuscular dysplasia: Secondary | ICD-10-CM

## 2020-02-01 DIAGNOSIS — Z95820 Peripheral vascular angioplasty status with implants and grafts: Secondary | ICD-10-CM | POA: Diagnosis not present

## 2020-02-01 DIAGNOSIS — Z79899 Other long term (current) drug therapy: Secondary | ICD-10-CM | POA: Diagnosis not present

## 2020-02-01 NOTE — Progress Notes (Signed)
   CC: Diabetes  HPI:  [name] is a 64 y.o. female, with a PMH noted below, who presents to the [facility] for follow up on her diabetes. To see the management of her acute and chronic conditions, please see the A&P note under the encounters tab.   Past Medical History:  Diagnosis Date  . Colon polyps   . Colon polyps   . Diabetes mellitus   . Diverticulosis   . Hyperlipidemia   . Hypertension   . 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 Albertine Patricia  . Sleep apnea 2007    status post polysomnogram 2007 , suggested use of CPAP  . Ventricular hypertrophy 2006    a 2-D echo in 2006, ejection fraction 55%, mild tricuspid regurgitation noted as well, 2-D echo November 13, 3746 showed diastolic dysfunction with LVEF normal of 65%   Review of Systems:   Review of Systems  Constitutional: Negative for chills, diaphoresis, fever, malaise/fatigue and weight loss.  Respiratory: Negative for shortness of breath and wheezing.   Cardiovascular: Negative for chest pain and palpitations.  Gastrointestinal: Negative for abdominal pain, constipation, diarrhea, nausea and vomiting.  Skin: Negative for rash.  Neurological: Negative for dizziness and headaches.   Physical Exam:  Vitals:   02/01/20 1356  BP: (!) 142/90  Pulse: 91  Temp: 98 F (36.7 C)  TempSrc: Oral  SpO2: 98%  Weight: 213 lb 8 oz (96.8 kg)  Height: 5\' 1"  (1.549 m)   Physical Exam Constitutional:      General: She is not in acute distress.    Appearance: She is obese. She is not ill-appearing or toxic-appearing.  Cardiovascular:     Rate and Rhythm: Normal rate and regular rhythm.     Pulses: Normal pulses.     Heart sounds: Normal heart sounds. No murmur. No friction rub. No gallop.   Pulmonary:     Effort:  Pulmonary effort is normal.     Breath sounds: Normal breath sounds. No wheezing, rhonchi or rales.  Abdominal:     General: Abdomen is flat. Bowel sounds are normal.     Tenderness: There is no abdominal tenderness. There is no guarding.     Hernia: No hernia is present.  Musculoskeletal:        General: No swelling or tenderness.     Comments: Foot Exam:  Dorsalis Pedis Pulses/Tibialis posterior 3+ bilaterally Sensation intact bilaterally  Skin:    General: Skin is warm and dry.     Findings: No bruising or lesion.  Psychiatric:        Mood and Affect: Mood normal.        Behavior: Behavior normal.     Assessment & Plan:   See Encounters Tab for problem based charting.  Patient discussed with Dr. Daryll Drown

## 2020-02-01 NOTE — Patient Instructions (Signed)
To Ms. Pillars,  It was a pleasure working with you today. Today we discussed your results from your ultrasound and diabetes. After reviewing your ultrasound, there is no indication for an intervention at this time. Your last A1c was within your goal range, please continue taking your Xultophy as directed. Have a good day! Sincerely,  Maudie Mercury, MD

## 2020-02-02 ENCOUNTER — Encounter: Payer: Self-pay | Admitting: Internal Medicine

## 2020-02-02 NOTE — Assessment & Plan Note (Signed)
Patient presents to the office with for follow up from her PCP appointment 01/08/2020. Patient has a recent A1c of 5.9 on 40U of Xultophy. She has been faithfully checking her blood sugars several times a day, as instructed by Dr. Marianna Payment. She reports that her lowest blood glucose is 102 and her highest has been 123. She denies symptoms of hypoglycemia, such as diaphoresis, shaking, anxiety, or palpitations. She reports compliance with her medications.  A:  Patient with diabetes presents without episodes of hypoglycemia and stable glucose levels. Continue medications, will follow up with provider for continued monitoring.   Plan:  - Continue current medical regimen - FU A1c in 2 months.

## 2020-02-02 NOTE — Assessment & Plan Note (Signed)
Patient presents after VAS US Renal Duplex for fibromuscular dysplasia. Patient has had stent placement in 2007 and patent in 2012. Study show 1-59% stenosis bilaterally. No intervention at this time.

## 2020-02-06 ENCOUNTER — Other Ambulatory Visit: Payer: Self-pay | Admitting: Internal Medicine

## 2020-02-06 DIAGNOSIS — F1190 Opioid use, unspecified, uncomplicated: Secondary | ICD-10-CM

## 2020-02-08 NOTE — Progress Notes (Signed)
Internal Medicine Clinic Attending  Case discussed with Dr. Winters at the time of the visit.  We reviewed the resident's history and exam and pertinent patient test results.  I agree with the assessment, diagnosis, and plan of care documented in the resident's note.  

## 2020-02-29 ENCOUNTER — Other Ambulatory Visit: Payer: Self-pay | Admitting: Internal Medicine

## 2020-02-29 DIAGNOSIS — F1190 Opioid use, unspecified, uncomplicated: Secondary | ICD-10-CM

## 2020-03-13 ENCOUNTER — Ambulatory Visit
Admission: RE | Admit: 2020-03-13 | Discharge: 2020-03-13 | Disposition: A | Discharge status: Home / Self Care | Payer: Medicare Other | Source: Ambulatory Visit | Attending: *Deleted | Admitting: *Deleted

## 2020-03-13 ENCOUNTER — Other Ambulatory Visit: Payer: Self-pay

## 2020-03-13 DIAGNOSIS — Z1231 Encounter for screening mammogram for malignant neoplasm of breast: Secondary | ICD-10-CM | POA: Diagnosis not present

## 2020-03-15 ENCOUNTER — Other Ambulatory Visit: Payer: Self-pay | Admitting: Internal Medicine

## 2020-03-15 DIAGNOSIS — R928 Other abnormal and inconclusive findings on diagnostic imaging of breast: Secondary | ICD-10-CM

## 2020-03-20 ENCOUNTER — Other Ambulatory Visit: Payer: Medicare Other

## 2020-03-27 ENCOUNTER — Other Ambulatory Visit: Payer: Self-pay | Admitting: Internal Medicine

## 2020-03-27 DIAGNOSIS — F1190 Opioid use, unspecified, uncomplicated: Secondary | ICD-10-CM

## 2020-03-29 LAB — HEMOGLOBIN A1C: Hemoglobin A1C: 6.2

## 2020-04-01 ENCOUNTER — Ambulatory Visit
Admission: RE | Admit: 2020-04-01 | Discharge: 2020-04-01 | Disposition: A | Discharge status: Home / Self Care | Payer: Medicare Other | Source: Ambulatory Visit | Attending: *Deleted | Admitting: *Deleted

## 2020-04-01 ENCOUNTER — Other Ambulatory Visit: Payer: Self-pay

## 2020-04-01 DIAGNOSIS — R928 Other abnormal and inconclusive findings on diagnostic imaging of breast: Secondary | ICD-10-CM

## 2020-04-01 DIAGNOSIS — N6489 Other specified disorders of breast: Secondary | ICD-10-CM | POA: Diagnosis not present

## 2020-04-01 DIAGNOSIS — R922 Inconclusive mammogram: Secondary | ICD-10-CM | POA: Diagnosis not present

## 2020-04-04 ENCOUNTER — Other Ambulatory Visit: Payer: Self-pay

## 2020-04-04 ENCOUNTER — Ambulatory Visit (INDEPENDENT_AMBULATORY_CARE_PROVIDER_SITE_OTHER): Payer: Medicare Other | Admitting: Internal Medicine

## 2020-04-04 VITALS — BP 150/88 | HR 96 | Wt 212.1 lb

## 2020-04-04 DIAGNOSIS — E1142 Type 2 diabetes mellitus with diabetic polyneuropathy: Secondary | ICD-10-CM

## 2020-04-04 DIAGNOSIS — N1832 Chronic kidney disease, stage 3b: Secondary | ICD-10-CM | POA: Diagnosis not present

## 2020-04-04 DIAGNOSIS — N183 Chronic kidney disease, stage 3 unspecified: Secondary | ICD-10-CM | POA: Diagnosis not present

## 2020-04-04 DIAGNOSIS — I150 Renovascular hypertension: Secondary | ICD-10-CM

## 2020-04-04 DIAGNOSIS — G4733 Obstructive sleep apnea (adult) (pediatric): Secondary | ICD-10-CM | POA: Diagnosis not present

## 2020-04-04 DIAGNOSIS — Z794 Long term (current) use of insulin: Secondary | ICD-10-CM

## 2020-04-04 DIAGNOSIS — E1122 Type 2 diabetes mellitus with diabetic chronic kidney disease: Secondary | ICD-10-CM

## 2020-04-04 DIAGNOSIS — I129 Hypertensive chronic kidney disease with stage 1 through stage 4 chronic kidney disease, or unspecified chronic kidney disease: Secondary | ICD-10-CM

## 2020-04-04 DIAGNOSIS — F1190 Opioid use, unspecified, uncomplicated: Secondary | ICD-10-CM | POA: Diagnosis not present

## 2020-04-04 DIAGNOSIS — I773 Arterial fibromuscular dysplasia: Secondary | ICD-10-CM

## 2020-04-04 LAB — GLUCOSE, CAPILLARY: Glucose-Capillary: 113 mg/dL — ABNORMAL HIGH (ref 70–99)

## 2020-04-04 NOTE — Assessment & Plan Note (Addendum)
Patient presents to for a f/u for her diabetes. Her most recent A1c is 5.9  Currently taking Xultophy 40 units daily.   She states that her last eye exam was a year ago and states she will get one soon.   She denies any symptoms of hyper or hypoglycemia.  Foot exam was performed today and showed good sensation throughout without overt signs of neuropathy.   Plan: - Repeat Hgb A1c today, microalbumin/Cr, BMP - Continue Xultophy 40 units daily.

## 2020-04-04 NOTE — Assessment & Plan Note (Addendum)
Patient is receiving chronic opioid management for her knee osteoarthritis.  Plan: - Continue Tramadol 5 mg

## 2020-04-04 NOTE — Assessment & Plan Note (Addendum)
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 She is currently taking Coreg 25 mg bid, losartan-hydrochlorothiazide 100-25 mg q day, Spironolactone 25 mg q AM and 50 mg q PM, and amlodipine 5 mg daily. She admits to being compliant with her 5 drug regiment. She recently had a Renal US to assess for worsening fibromuscular dysplasia, which showed 1-59% stenosis bilaterally.   Patient's most recent renal function is Cr of 2.0 and GFR of 30, indicating CKD stage IV, which is limiting our ability to increase her HTN medications and concerning for electrolyte abnormalities as a result. She will need to follow up with [facility] closely as her kidney function will likely decline further despite maximal medication management.   Patient's blood pressure today was 150/88. She denies chest pain, shortness of breath, dizziness or fatigue. She states that she is tolerating her medications well.   Plan: - Counseled her on salt restricted diets, getting plenty of sleep at night, using her CPAP, getting enough exercise and effective diet.  -Patient may benefit from changing hydrochlorothiazide to chlorthalidone to optimize this regiment, but my consern is this may worsen electrolyte abnormalities putting her at an increased risk of complications. - tolerating spironolactone well but there is a discrepancy between her Spironolactone dose noted in our chart compared to [facility]. This will need to eb addressed at her follow up appointment. -If patient continues to have resistant hypertension, will need to consider increasing spironolactone if her kidney function can tolerate it. I will defer this decision to her nephrologist. - Repeat Kidney function today.

## 2020-04-04 NOTE — Patient Instructions (Signed)
Thank you, [facility] for allowing [place] to provide your care today. Today we discussed HTN, diabetes, kidney disease.    I have ordered hemoglobin A1c, microalbumin, basic metabolic panel labs for you. I will call if any are abnormal.    I have ordered the following medication/changed the following medications: None  Please follow-up in 3 months.    Should you have any questions or concerns please call the internal medicine clinic at [phone].    Marianna Payment, D.O. [facility]

## 2020-04-04 NOTE — Progress Notes (Signed)
CC: HTN  HPI:  [name] is a 64 y.o. female with a past medical history stated below and presents today for HTN & DM management. Please see problem based assessment and plan for additional details.   Past Medical History:  Diagnosis Date  . Colon polyps   . Colon polyps   . Diabetes mellitus   . Diverticulosis   . Hyperlipidemia   . Hypertension   . 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 Albertine Patricia  . Sleep apnea 2007    status post polysomnogram 2007 , suggested use of CPAP  . Ventricular hypertrophy 2006    a 2-D echo in 2006, ejection fraction 55%, mild tricuspid regurgitation noted as well, 2-D echo November 14, 6235 showed diastolic dysfunction with LVEF normal of 65%    Family History  Problem Relation Age of Onset  . Diabetes Mother   . Cancer Mother   . Diabetes Father   . Cancer Other        breast cancer died in her 30s  . Bipolar disorder Son   . Bipolar disorder Daughter   . Alcohol abuse Son   . Drug abuse Son   . Breast cancer Cousin   . Stroke Neg Hx      Social History   Socioeconomic History  . Marital status: Married    Spouse name: Not on file  . Number of children: Not on file  . Years of education: 52  . Highest education level: Not on file  Occupational History  . Not on file  Tobacco Use  . Smoking status: Former Smoker    Packs/day: 0.33    Years: 25.00    Pack years: 8.25    Quit date: 10/26/2005    Years since quitting: 14.4  . Smokeless tobacco: Never Used  Substance and Sexual Activity  . Alcohol use: No    Alcohol/week: 0.0 standard drinks  . Drug use: No  . Sexual activity: Not on file  Other Topics Concern  . Not on file  Social History Narrative   Lives with husband and two sons.    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   Completed to 11th grade.   Social Determinants of Health   Financial Resource Strain:   . Difficulty of Paying Living Expenses:   Food Insecurity:   . Worried About Charity fundraiser in the Last Year:   . Arboriculturist in the Last Year:   Transportation Needs:   . Film/video editor (Medical):   Marland Kitchen Lack of Transportation (Non-Medical):   Physical Activity:   . Days of Exercise per Week:   . Minutes of Exercise per Session:   Stress:   . Feeling of Stress :   Social Connections:   . Frequency of Communication with Friends and Family:   . Frequency of Social Gatherings with Friends and Family:   . Attends Religious Services:   . Active Member of Clubs or Organizations:   . Attends Archivist Meetings:   Marland Kitchen Marital Status:   Intimate Partner Violence:   . Fear of Current or Ex-Partner:   . Emotionally Abused:   Marland Kitchen Physically Abused:   . Sexually Abused:  Review of Systems: ROS -negative except for was noted in the assessment and plan   There were no vitals filed for this visit.   Physical Exam: Physical Exam HENT:     Head: Normocephalic and atraumatic.  Neck:     Trachea: No tracheal deviation.  Cardiovascular:     Rate and Rhythm: Normal rate.     Heart sounds: No murmur heard.  No friction rub. No gallop.   Pulmonary:     Effort: Pulmonary effort is normal. No respiratory distress.     Breath sounds: Normal breath sounds.  Abdominal:     General: Bowel sounds are normal. There is no distension.     Palpations: Abdomen is soft.     Tenderness: There is no abdominal tenderness.  Musculoskeletal:        General: No tenderness. Normal range of motion.  Skin:    General: Skin is warm and dry.  Neurological:     Mental Status: She is alert and oriented to person, place, and time.      Assessment & Plan:   See Encounters Tab for problem based charting.  Patient discussed with Dr. Philipp Ovens

## 2020-04-05 LAB — MICROALBUMIN / CREATININE URINE RATIO
Creatinine, Urine: 149.8 mg/dL
Microalb/Creat Ratio: 54 mg/g creat — ABNORMAL HIGH (ref 0–29)
Microalbumin, Urine: 81.6 ug/mL

## 2020-04-05 LAB — BMP8+ANION GAP
Anion Gap: 14 mmol/L (ref 10.0–18.0)
BUN/Creatinine Ratio: 17 (ref 12–28)
BUN: 33 mg/dL — ABNORMAL HIGH (ref 8–27)
CO2: 22 mmol/L (ref 20–29)
Calcium: 10.3 mg/dL (ref 8.7–10.3)
Chloride: 108 mmol/L — ABNORMAL HIGH (ref 96–106)
Creatinine, Ser: 2 mg/dL — ABNORMAL HIGH (ref 0.57–1.00)
GFR calc Af Amer: 30 mL/min/{1.73_m2} — ABNORMAL LOW (ref >59)
GFR calc non Af Amer: 26 mL/min/{1.73_m2} — ABNORMAL LOW (ref >59)
Glucose: 93 mg/dL (ref 65–99)
Potassium: 4.6 mmol/L (ref 3.5–5.2)
Sodium: 144 mmol/L (ref 134–144)

## 2020-04-08 ENCOUNTER — Encounter: Payer: Self-pay | Admitting: Internal Medicine

## 2020-04-08 NOTE — Assessment & Plan Note (Signed)
Patient has resistant HTN on a significant amount of blood pressure medications. She would likely benefit form being strictly compliant with her CPAP. I know that she has had a difficult time tolerating this in the past.   Plan: - She will likely need a new sleep study.

## 2020-04-08 NOTE — Progress Notes (Signed)
Internal Medicine Clinic Attending  Case discussed with Dr. Marianna Payment at the time of the visit.  We reviewed the resident's history and exam and pertinent patient test results.  I agree with the assessment, diagnosis, and plan of care documented in the resident's note with the following corrections:  Patient is currently on a 5 drug regimen (Coreg 25 BID, Spiro 25 mg qAM and 50 qPM, losartan-HCTZ 100-25, and amlodipine 5 mg daily). There is some discrepency in her spironolactone dosing between Lindsay last note (25 qAM and 50 qPM) on 12/29/19 and our records (50 mg QD). This will need to be clarified with the patient at her next visit. Would advise caution with increasing spironolactone further in the setting of worsening renal function. Potassium will need to be monitored closely. She is now CKD IV. She will also need close follow up with [facility]. Next step would likely be addition of hydralazine.

## 2020-04-08 NOTE — Assessment & Plan Note (Signed)
Has a history of CKD.  Plan: - Repeat BMP today.

## 2020-04-18 ENCOUNTER — Other Ambulatory Visit: Payer: Self-pay | Admitting: Internal Medicine

## 2020-04-18 DIAGNOSIS — F1190 Opioid use, unspecified, uncomplicated: Secondary | ICD-10-CM

## 2020-05-02 MED FILL — XULTOPHY 100 UNIT-3.6MG/ML: 100-3.6 | 150 days supply | Qty: 60 | Fill #1

## 2020-05-03 ENCOUNTER — Telehealth: Payer: Self-pay | Admitting: *Deleted

## 2020-05-03 NOTE — Telephone Encounter (Signed)
Claiborne Billings, got a call from steve furr at [facility] [employer] pt's xultophy had come there she is out and havent gotten a new shipment from them, looking back jen's note states approved thru end of year. I dont have a ph# for [facility] can you check or let me know who to call? thanks

## 2020-05-06 NOTE — Telephone Encounter (Signed)
Her meds would have been shipped to [facility] outpatient--just to clarify, did [name] say they had her meds?  If so, she can go there to pick them up.

## 2020-05-06 NOTE — Telephone Encounter (Signed)
No, as I stated they dont have any for her, pharm# [phone]

## 2020-05-06 NOTE — Telephone Encounter (Signed)
Ok-if pt has [facility] PAP than a refill req will have to be signed by a MD and faxed to [facility], once it is processed by [facility] it has been taking 14 business days or longer to receive meds because they are experiencing delays in shipping.  Their # is [phone] and the refill req form can be found at [email or web address]  I will not be able to call on it until Tues 05/07/20.

## 2020-05-06 NOTE — Telephone Encounter (Signed)
Received TC from [name] at [facility], he is asking for an update on NOVO PAP.  Informed [name] of Pharmacy Technician's last note.  Also informed [name] [facility] is unable to obtain samples of Xultophy at this time Per DPlyler.  He is requesting Pharmacy Technician call [facility] pharmacy tomorrow after she touches base with NOVO PAP. Thanks, SChaplin, RN,BSN

## 2020-05-07 NOTE — Telephone Encounter (Signed)
Spoke to Gonzalez at [facility]-he thinks patient should be ok until middle/end of next week when meds are expected to be delivered (week of 05/13/2020).  Per [facility], 10:40am, refill was processed on 05/01/20 and should take about 10 business days before medications are actually shipped.

## 2020-05-09 ENCOUNTER — Other Ambulatory Visit: Payer: Self-pay | Admitting: Internal Medicine

## 2020-05-09 DIAGNOSIS — F1190 Opioid use, unspecified, uncomplicated: Secondary | ICD-10-CM

## 2020-05-23 ENCOUNTER — Telehealth: Payer: Self-pay

## 2020-05-23 ENCOUNTER — Telehealth: Payer: Self-pay | Admitting: Dietician

## 2020-05-23 NOTE — Telephone Encounter (Signed)
Pt is enrolled in [employer] Patient Assistance Program and they are experiencing shipping delays at this time.  Pt was provided a 30 free voucher for her Xultophy to get her through until the meds can be shipped.  Can you please send a 30 day prescription to [facility] on [address]) per pt. Preference?  Also, a prescription for the Novofine pen needles-NDC: [phone].

## 2020-05-23 NOTE — Telephone Encounter (Signed)
Pt was provided with a 30 day free voucher via [employer] Patient Assistance for [employer].  BIN:  [id number]  PCN:  [id number]  GRP:  [id number]  ID:  [id number]  Pt aware. I spoke to Ms. Yaney today and sent request for Dr. Marianna Payment to send in 30 day script to [facility] on [address]) per pt prefernce.

## 2020-05-23 NOTE — Telephone Encounter (Signed)
Kristy [name] left a message and then cane to the office asking about her [facility] patient assistance. Per Claiborne Billings, Pharmacy tech, who called [facility] this morning, they are behind on their patient assistance. I gave Kristy [facility] [employer] patient assistance number to call to follow up per [facility] suggestion.

## 2020-05-24 ENCOUNTER — Telehealth: Payer: Self-pay

## 2020-05-24 ENCOUNTER — Other Ambulatory Visit (HOSPITAL_COMMUNITY): Payer: Self-pay | Admitting: Internal Medicine

## 2020-05-24 MED FILL — NOVOFINE 32G NEEDLES: 32G X 6 MM | 90 days supply | Qty: 100 | Fill #0

## 2020-05-24 MED FILL — XULTOPHY 100 UNIT-3.6MG/ML: 100-3.6 | 37 days supply | Qty: 15 | Fill #1

## 2020-05-24 NOTE — Telephone Encounter (Signed)
Medication was received at [facility] this morning per [facility].  Novolog, Xultophy, and Pen Needles.  Called patient today, patient aware that she has to go to [facility] to pick up medication.

## 2020-05-24 NOTE — Telephone Encounter (Signed)
Per [name], previous notation incorrect, medication was for another pt, not [name] is able to give Kristy [name] a free supply of Xultophy and will explain the mix-up to patient.

## 2020-05-28 MED FILL — NOVOFINE 32G NEEDLES: 32G X 6 MM | 90 days supply | Qty: 100 | Fill #1

## 2020-05-28 MED FILL — XULTOPHY 100 UNIT-3.6MG/ML: 100-3.6 | 112 days supply | Qty: 45 | Fill #2

## 2020-05-31 ENCOUNTER — Other Ambulatory Visit: Payer: Self-pay | Admitting: Internal Medicine

## 2020-05-31 DIAGNOSIS — F1190 Opioid use, unspecified, uncomplicated: Secondary | ICD-10-CM

## 2020-07-03 ENCOUNTER — Other Ambulatory Visit: Payer: Self-pay | Admitting: *Deleted

## 2020-07-03 DIAGNOSIS — I10 Essential (primary) hypertension: Secondary | ICD-10-CM

## 2020-07-03 MED ORDER — AMLODIPINE-ATORVASTATIN 5-40 MG PO TABS: 1.0000 | ORAL_TABLET | Freq: Every day | ORAL | 3 refills | Status: DC

## 2020-07-04 ENCOUNTER — Other Ambulatory Visit: Payer: Self-pay | Admitting: *Deleted

## 2020-07-04 DIAGNOSIS — I10 Essential (primary) hypertension: Secondary | ICD-10-CM

## 2020-07-04 MED ORDER — LOSARTAN POTASSIUM-HCTZ 100-25 MG PO TABS: 1.0000 | ORAL_TABLET | Freq: Every day | ORAL | 3 refills | Status: DC

## 2020-07-05 ENCOUNTER — Other Ambulatory Visit: Payer: Self-pay | Admitting: Internal Medicine

## 2020-07-05 DIAGNOSIS — F1190 Opioid use, unspecified, uncomplicated: Secondary | ICD-10-CM

## 2020-07-15 ENCOUNTER — Ambulatory Visit (INDEPENDENT_AMBULATORY_CARE_PROVIDER_SITE_OTHER): Payer: Medicare Other | Admitting: Internal Medicine

## 2020-07-15 ENCOUNTER — Other Ambulatory Visit: Payer: Self-pay

## 2020-07-15 ENCOUNTER — Telehealth: Payer: Self-pay | Admitting: Dietician

## 2020-07-15 ENCOUNTER — Encounter: Payer: Self-pay | Admitting: Internal Medicine

## 2020-07-15 VITALS — BP 141/86 | HR 89 | Temp 98.4°F | Wt 211.2 lb

## 2020-07-15 DIAGNOSIS — Z23 Encounter for immunization: Secondary | ICD-10-CM | POA: Diagnosis not present

## 2020-07-15 DIAGNOSIS — E119 Type 2 diabetes mellitus without complications: Secondary | ICD-10-CM | POA: Diagnosis not present

## 2020-07-15 DIAGNOSIS — I129 Hypertensive chronic kidney disease with stage 1 through stage 4 chronic kidney disease, or unspecified chronic kidney disease: Secondary | ICD-10-CM | POA: Diagnosis not present

## 2020-07-15 DIAGNOSIS — N183 Chronic kidney disease, stage 3 unspecified: Secondary | ICD-10-CM | POA: Diagnosis not present

## 2020-07-15 DIAGNOSIS — E1142 Type 2 diabetes mellitus with diabetic polyneuropathy: Secondary | ICD-10-CM

## 2020-07-15 DIAGNOSIS — I150 Renovascular hypertension: Secondary | ICD-10-CM

## 2020-07-15 DIAGNOSIS — N189 Chronic kidney disease, unspecified: Secondary | ICD-10-CM | POA: Diagnosis not present

## 2020-07-15 DIAGNOSIS — E1122 Type 2 diabetes mellitus with diabetic chronic kidney disease: Secondary | ICD-10-CM | POA: Diagnosis not present

## 2020-07-15 DIAGNOSIS — I701 Atherosclerosis of renal artery: Secondary | ICD-10-CM | POA: Diagnosis not present

## 2020-07-15 LAB — POCT GLYCOSYLATED HEMOGLOBIN (HGB A1C): Hemoglobin A1C: 6.8 % — AB (ref 4.0–5.6)

## 2020-07-15 LAB — GLUCOSE, CAPILLARY: Glucose-Capillary: 97 mg/dL (ref 70–99)

## 2020-07-15 NOTE — Assessment & Plan Note (Signed)
Patient presenting to the clinic for follow up on her DM. She is currently taking Xultophy 40 units daily. She states that she is compliant with her medication, and does not report any side effects. Her A1c today is 6.8, which is still controlled, but increased from her previous. She has stopped metformin due to GI side effects. She is almost out of her medication, but is working with our pharmacy tech to ensure her medications are cleared.  - Continue current regimen.  - reassess in 3 months time.

## 2020-07-15 NOTE — Assessment & Plan Note (Signed)
BP Readings from Last 3 Encounters:  07/15/20 (!) 141/86  04/04/20 (!) 150/88  02/01/20 (!) 142/90   Patient presenting to the clinic today for follow up on her HTN. She states that she is doing well and is compliant with her impressive regimen of Coreg 25 mg BID, Losartan-HCTZ 100-25 mg daily, Spironolactone 25 mg in the AM and 50 in the PM, and amlodipine 5 mg daily. She is following with nephrology today for management of her HTN medications. Patient admits that blood work will be drawn at her nephrology follow up this afternoon.  - Continue current regimen - FU [facility] - Will hold blood work today given nephrology appointment today.

## 2020-07-15 NOTE — Progress Notes (Signed)
   CC: Blood Pressure Check   HPI:  [name] is a 64 y.o. female, with a PMH noted below, who presents to the clinic for her blood pressure. To see the management of her acute and chronic conditions, please see the A&P under the Encounters tab.   Past Medical History:  Diagnosis Date  . Colon polyps   . Colon polyps   . Diabetes mellitus   . Diverticulosis   . Hyperlipidemia   . Hypertension   . 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 Albertine Patricia  . Sleep apnea 2007    status post polysomnogram 2007 , suggested use of CPAP  . Ventricular hypertrophy 2006    a 2-D echo in 2006, ejection fraction 55%, mild tricuspid regurgitation noted as well, 2-D echo November 13, 6385 showed diastolic dysfunction with LVEF normal of 65%   Review of Systems:   Review of Systems  Constitutional: Negative for chills, fever, malaise/fatigue and weight loss.  HENT: Negative for ear discharge, ear pain and tinnitus.   Cardiovascular: Negative for palpitations, orthopnea and leg swelling.  Gastrointestinal: Negative for abdominal pain, blood in stool, constipation, diarrhea, nausea and vomiting.  Neurological: Negative for tingling, tremors and headaches.    Physical Exam:  Vitals:   07/15/20 1052  BP: (!) 145/97  Pulse: 98  Temp: 98.4 F (36.9 C)  TempSrc: Oral  SpO2: 100%  Weight: 211 lb 3.2 oz (95.8 kg)   Physical Exam Vitals reviewed.  Constitutional:      General: She is not in acute distress.    Appearance: Normal appearance. She is not ill-appearing or toxic-appearing.  HENT:     Head: Normocephalic and atraumatic.  Eyes:     General:        Right eye: No discharge.        Left eye: No discharge.     Conjunctiva/sclera: Conjunctivae normal.  Cardiovascular:      Rate and Rhythm: Normal rate and regular rhythm.     Pulses: Normal pulses.     Heart sounds: Normal heart sounds. No murmur heard.  No friction rub. No gallop.   Pulmonary:     Effort: Pulmonary effort is normal.     Breath sounds: Normal breath sounds. No wheezing, rhonchi or rales.  Abdominal:     General: Bowel sounds are normal.     Palpations: Abdomen is soft.     Tenderness: There is no abdominal tenderness. There is no guarding.  Musculoskeletal:        General: No swelling.     Right lower leg: No edema.     Left lower leg: No edema.  Neurological:     General: No focal deficit present.     Mental Status: She is alert and oriented to person, place, and time.  Psychiatric:        Mood and Affect: Mood normal.        Behavior: Behavior normal.     Assessment & Plan:   See Encounters Tab for problem based charting.  Patient discussed with Dr. Heber Hernando

## 2020-07-15 NOTE — Telephone Encounter (Signed)
Kristy [name] says she is almost out of her diabetes medicine and wants to know where she goes to sign the paper. I told her I would relay her message to our pharmacy technician to assist her with patient assistance.

## 2020-07-15 NOTE — Patient Instructions (Signed)
To Ms. Fairbairn,  It was a pleasure talking to you today! Today we discussed your Kidney disease, blood pressure, and diabetes. For your blood pressures, they are decreased from your last visit, please follow up with your kidney doctor's appointment today. We will hold off on lab work for your kidney disease as you are being managed by your nephrologist. Continue your diabetes medication as prescribed. I will speak to your primary care physician today about your pain medication. Have a good day!  Sincerely,  Maudie Mercury, MD

## 2020-07-16 NOTE — Telephone Encounter (Signed)
Explained this to Ms Muldrow and she verbalized understanding saying she has enough medicine to last, she just wanted to be sure she did not run out.

## 2020-07-16 NOTE — Telephone Encounter (Signed)
A refill request is not due for another 30 days.  Per her last fill, she has 63 days left of medication.  The provider's office will request her next refill when appropriate.  I have a reminder set for her refill for 08/15/20 to begin her refill process.  She will need to re-enroll for patient assistance and can sign/submit a re-enrollment app at the end of October I believe.  This is the only signature we would need from patient, for her re-enrollment.  No patient signature required for refills.

## 2020-07-29 ENCOUNTER — Other Ambulatory Visit: Payer: Self-pay | Admitting: Student

## 2020-07-29 DIAGNOSIS — F1190 Opioid use, unspecified, uncomplicated: Secondary | ICD-10-CM

## 2020-08-05 ENCOUNTER — Encounter: Payer: Self-pay | Admitting: *Deleted

## 2020-08-05 NOTE — Progress Notes (Unsigned)

## 2020-08-07 NOTE — Progress Notes (Signed)
Internal Medicine Clinic Attending ? ?Case discussed with Dr. Winters  At the time of the visit.  We reviewed the resident?s history and exam and pertinent patient test results.  I agree with the assessment, diagnosis, and plan of care documented in the resident?s note.  ?

## 2020-08-21 ENCOUNTER — Other Ambulatory Visit: Payer: Self-pay | Admitting: Internal Medicine

## 2020-08-21 DIAGNOSIS — F1190 Opioid use, unspecified, uncomplicated: Secondary | ICD-10-CM

## 2020-08-23 ENCOUNTER — Telehealth: Payer: Self-pay | Admitting: *Deleted

## 2020-08-23 ENCOUNTER — Other Ambulatory Visit: Payer: Self-pay | Admitting: Internal Medicine

## 2020-08-23 DIAGNOSIS — F1190 Opioid use, unspecified, uncomplicated: Secondary | ICD-10-CM

## 2020-08-23 MED ORDER — TRAMADOL HCL 50 MG PO TABS: ORAL_TABLET | ORAL | 0 refills | Status: DC

## 2020-08-23 NOTE — Telephone Encounter (Signed)
It is suppose to be a PRN medication and therefore, she shouldn't be taking it every 3 hours as a scheduled medication. If she is needing more medication, than it sounds like her requirement has increased. Although tramadol will not affect her kidneys it will affect her functionality. I can refill it this time, but she will need a new pain contract.

## 2020-08-23 NOTE — Telephone Encounter (Signed)
Pt called / informed of refill. Stated she take it as needed but she never know how she will be feeling once she wakes up; so she may have to more or less. Agrees to sign new pain contract at next appt.

## 2020-08-23 NOTE — Telephone Encounter (Signed)
Call from pt asking why her Tramadol rx was not refilled; informed requested too soon per Dr Marianna Payment (last refilled on 10/8 x 75 tabs). Pt states she takes 2 tabs every 6 hrs and only get 75 tabs which does not last the entire month and sometimes she has to go w/o and be in pain and has to stay in the bed. States her kidney doctor told her Tramadol will not affect her kidneys.

## 2020-09-16 LAB — HEMOGLOBIN A1C: Hemoglobin A1C: 7

## 2020-09-18 MED FILL — XULTOPHY 100 UNIT-3.6MG/ML: 100-3.6 | 36 days supply | Qty: 15 | Fill #3

## 2020-09-20 ENCOUNTER — Other Ambulatory Visit: Payer: Self-pay | Admitting: Internal Medicine

## 2020-09-20 DIAGNOSIS — F1190 Opioid use, unspecified, uncomplicated: Secondary | ICD-10-CM

## 2020-09-23 ENCOUNTER — Telehealth: Payer: Self-pay

## 2020-09-23 ENCOUNTER — Other Ambulatory Visit: Payer: Self-pay

## 2020-09-23 ENCOUNTER — Encounter: Payer: Self-pay | Admitting: Internal Medicine

## 2020-09-23 ENCOUNTER — Ambulatory Visit (INDEPENDENT_AMBULATORY_CARE_PROVIDER_SITE_OTHER): Payer: Medicare Other | Admitting: Internal Medicine

## 2020-09-23 ENCOUNTER — Other Ambulatory Visit: Payer: Self-pay | Admitting: Dietician

## 2020-09-23 VITALS — BP 145/93 | HR 99 | Temp 98.7°F | Ht 61.0 in | Wt 213.9 lb

## 2020-09-23 DIAGNOSIS — I150 Renovascular hypertension: Secondary | ICD-10-CM

## 2020-09-23 DIAGNOSIS — M170 Bilateral primary osteoarthritis of knee: Secondary | ICD-10-CM | POA: Diagnosis not present

## 2020-09-23 DIAGNOSIS — F1190 Opioid use, unspecified, uncomplicated: Secondary | ICD-10-CM

## 2020-09-23 MED ORDER — HYDROCODONE-ACETAMINOPHEN 5-325 MG PO TABS: 1.0000 | ORAL_TABLET | Freq: Four times a day (QID) | ORAL | 0 refills | Status: AC | PRN

## 2020-09-23 NOTE — Assessment & Plan Note (Signed)
Vitals:   09/23/20 1554 09/23/20 1634  BP: (!) 160/96 (!) 145/93  Pulse: 98 99  Temp: 98.7 F (37.1 C)   SpO2: 98%    Patient reports that she did not take her morning blood pressure medications.  We will continue to monitor.  Continue taking Coreg 25 mg twice daily, losartan-hydrochlorothiazide 100-25 mg daily, spironolactone 25 mg a.m. and 50 mg p.m. and amlodipine 5 mg daily.

## 2020-09-23 NOTE — Telephone Encounter (Signed)
I received two emails from [facility] [place] asking about [name]'s Xultophy refills:  "Good morning,    Kristy [name], [birth year], is requesting a refill on her Xultophy.  I don't have any for her here. Do you know if she is to be using a voucher at [facility]?  Thanks, [name]"   See my note from 07/15/20 for my last correspondence with [name] who is helping [name] with  Patient assistance.

## 2020-09-23 NOTE — Telephone Encounter (Signed)
Pt states tramadol does not work. Needs something stronger for her knee. appt red team [phone] today

## 2020-09-23 NOTE — Assessment & Plan Note (Signed)
Patient is on chronic opioid management.  Plan to stop tramadol during short-term course of Norco.  See assessment and plan under osteoarthritis.

## 2020-09-23 NOTE — Assessment & Plan Note (Addendum)
Patient presents today with left sided knee pain for about 1 week.  States that she has been exerting herself more than normal for the past week.  Reports at baseline she does not have pain every day and it is intermittent.  She reports taking tramadol 50 mg usually twice daily.  Sometimes she is requiring more than this.  Discussed that her most recent CrCl was 28ml/min and recommended dose adjustments are dosing every 12 hours with a max dose of 200 mg/day.  Patient reports that the tramadol does not help her knee pain however helps more of her neuropathic pain.  She is tried ibuprofen in the past however this was stopped due to her comorbidities.  She reports the Voltaren gel does not help either.  She had a right knee replacement many years ago and at that time it was discussed that she may need a left knee replacement.  She reports that she would like to avoid this until possible.  She has not tried steroid injections in the past.  On exam, pain was medially located without radiation and worse with weightbearing.  No tenderness to palpation or swelling.  Discussed with patient that we could schedule a steroid injection for this week.  Patient states that her pain is too severe to wait.  She prefers short-term stronger opiate medication until she can be further evaluated by orthopedic surgery and/or schedule a steroid injection.  Discussed with her we could schedule a steroid injection for later this week.  Patient states she will call to schedule this and if not can plan to try this at her next visit in December.   Plan:  -Short-term course of hydrocodone-acetaminophen 5-325 mg every 6 hours as needed -Stop tramadol while she is on Norco -If pain persists or worsens can try a steroid injection at her next visit -Ambulate referral to orthopedic surgery placed

## 2020-09-23 NOTE — Patient Instructions (Addendum)
Kristy [name],   It was a pleasure meeting you today. Today we discussed your knee pain. I have sent in a short course of hydrocodone-acetaminophen for your pain. While you are taking this medication, stop your tramadol use. I have also sent in a referral to orthopedic surgery to re-evaluate your knee. We can also schedule you for a knee injection while you wait for the referral.    Thank you for allowing [place] to be a part of your care!

## 2020-09-23 NOTE — Telephone Encounter (Signed)
Pt wants dr to call in some pain medicine for knee [phone]

## 2020-09-23 NOTE — Progress Notes (Signed)
   CC: knee pain  HPI:  [name] is a 64 y.o. with a PMHx listed below presenting for pain secondary to osteoarthritis. For details of today's visit and the status of her chronic medical issues please refer to the assessment and plan.   Past Medical History:  Diagnosis Date  . Colon polyps   . Colon polyps   . Diabetes mellitus   . Diverticulosis   . Hyperlipidemia   . Hypertension   . 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 Albertine Patricia  . Sleep apnea 2007    status post polysomnogram 2007 , suggested use of CPAP  . Ventricular hypertrophy 2006    a 2-D echo in 2006, ejection fraction 55%, mild tricuspid regurgitation noted as well, 2-D echo November 14, 1759 showed diastolic dysfunction with LVEF normal of 65%   Review of Systems:   Review of Systems  Constitutional: Positive for malaise/fatigue.  Respiratory: Negative for cough and shortness of breath.   Cardiovascular: Negative for leg swelling.  Gastrointestinal: Negative for abdominal pain, constipation, diarrhea, nausea and vomiting.  Musculoskeletal: Positive for joint pain. Negative for falls.  Neurological: Negative for sensory change and weakness.     Physical Exam:  Vitals:   09/23/20 1554 09/23/20 1634  BP: (!) 160/96 (!) 145/93  Pulse: 98 99  Temp: 98.7 F (37.1 C)   TempSrc: Oral   SpO2: 98%   Weight: 213 lb 14.4 oz (97 kg)   Height: 5\' 1"  (1.549 m)     Physical Exam Vitals reviewed.  Constitutional:      General: She is not in acute distress.    Appearance: Normal appearance. She is obese. She is not ill-appearing.  Cardiovascular:     Rate and Rhythm: Normal rate and regular rhythm.     Heart sounds: Normal heart sounds. No murmur heard.  No friction rub. No gallop.   Pulmonary:       Effort: Pulmonary effort is normal. No respiratory distress.     Breath sounds: Normal breath sounds. No wheezing or rales.  Abdominal:     General: Abdomen is flat. Bowel sounds are normal. There is no distension.     Palpations: Abdomen is soft.     Tenderness: There is no abdominal tenderness. There is no guarding.  Musculoskeletal:        General: No swelling or tenderness.     Right lower leg: No edema.     Left lower leg: No edema.     Comments: No tenderness to palpation. Passive ROM wnl. Gait abnormality   Skin:    General: Skin is warm and dry.  Neurological:     General: No focal deficit present.     Mental Status: She is alert and oriented to person, place, and time.  Psychiatric:        Mood and Affect: Mood normal.        Behavior: Behavior normal.        Thought Content: Thought content normal.        Judgment: Judgment normal.     Assessment & Plan:   See Encounters Tab for problem based charting.  Patient discussed with Dr. Dareen Piano

## 2020-09-24 NOTE — Telephone Encounter (Signed)
Email response form [name], pharmacy tech:  " Her meds shipped last in august-she should have meds through the end of the year, is she saying she is out?  Regardless, I did submit a reorder for her and just got off with [facility] checking on the status and they need me to refax it.  So these meds will not be shipped until 10-14 business days.  Today is the enrollment end date, so I am heading over to [facility] to refax the refill req-hopefully they get it in time."

## 2020-09-25 NOTE — Progress Notes (Signed)
Internal Medicine Clinic Attending ° °Case discussed with Dr. Rehman  At the time of the visit.  We reviewed the resident’s history and exam and pertinent patient test results.  I agree with the assessment, diagnosis, and plan of care documented in the resident’s note.  ° °

## 2020-10-10 ENCOUNTER — Telehealth: Payer: Self-pay

## 2020-10-10 NOTE — Telephone Encounter (Signed)
Left message for patient that I was mailing re-enrollment back to her.  [employer] [facility] re-enrollment form was missing 1 patient signature and proof of income.

## 2020-10-14 ENCOUNTER — Other Ambulatory Visit: Payer: Self-pay

## 2020-10-14 ENCOUNTER — Encounter: Payer: Medicare Other | Admitting: Internal Medicine

## 2020-10-14 ENCOUNTER — Encounter: Payer: Self-pay | Admitting: Internal Medicine

## 2020-10-14 ENCOUNTER — Ambulatory Visit (INDEPENDENT_AMBULATORY_CARE_PROVIDER_SITE_OTHER): Payer: Medicare Other | Admitting: Internal Medicine

## 2020-10-14 VITALS — BP 124/68 | HR 94 | Temp 98.0°F | Ht 62.0 in | Wt 215.6 lb

## 2020-10-14 DIAGNOSIS — M170 Bilateral primary osteoarthritis of knee: Secondary | ICD-10-CM

## 2020-10-14 DIAGNOSIS — E1142 Type 2 diabetes mellitus with diabetic polyneuropathy: Secondary | ICD-10-CM | POA: Diagnosis not present

## 2020-10-14 LAB — POCT GLYCOSYLATED HEMOGLOBIN (HGB A1C): Hemoglobin A1C: 7.4 % — AB (ref 4.0–5.6)

## 2020-10-14 LAB — GLUCOSE, CAPILLARY: Glucose-Capillary: 128 mg/dL — ABNORMAL HIGH (ref 70–99)

## 2020-10-14 NOTE — Patient Instructions (Signed)
To Ms. Viernes,  It was a pleasure meeting you today! We will have you follow up with your meter in 2-4 weeks to assess your blood sugar levels. Please continue taking your medications as directed! Have a happy holiday season!  Sincerely,  Maudie Mercury, MD

## 2020-10-14 NOTE — Progress Notes (Signed)
   CC: Diabetes Follow up  HPI:  Kristy [name] is a 63 y.o. M/F, with a PMH noted below, who presents to the clinic for her diabetes follow up. To see the management of their acute and chronic conditions, please see the A&P note under the Encounters tab.   Past Medical History:  Diagnosis Date  . Colon polyps   . Colon polyps   . Diabetes mellitus   . Diverticulosis   . Hyperlipidemia   . Hypertension   . 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 Albertine Patricia  . Sleep apnea 2007    status post polysomnogram 2007 , suggested use of CPAP  . Ventricular hypertrophy 2006    a 2-D echo in 2006, ejection fraction 55%, mild tricuspid regurgitation noted as well, 2-D echo November 13, 5881 showed diastolic dysfunction with LVEF normal of 65%   Review of Systems:   Review of Systems  Gastrointestinal: Negative for abdominal pain, constipation, diarrhea, nausea and vomiting.  Neurological: Negative for tingling, tremors, sensory change, speech change, focal weakness, weakness and headaches.     Physical Exam:  Vitals:   10/14/20 1431  BP: 124/68  Pulse: 94  Temp: 98 F (36.7 C)  TempSrc: Oral  SpO2: 100%  Weight: 215 lb 9.6 oz (97.8 kg)  Height: 5\' 2"  (1.575 m)   Physical Exam Vitals reviewed.  Constitutional:      General: She is not in acute distress.    Appearance: She is obese. She is not ill-appearing, toxic-appearing or diaphoretic.  Cardiovascular:     Rate and Rhythm: Normal rate and regular rhythm.     Pulses: Normal pulses.     Heart sounds: Normal heart sounds. No murmur heard. No friction rub. No gallop.   Pulmonary:     Effort: Pulmonary effort is normal.     Breath sounds: Normal breath sounds. No wheezing, rhonchi or rales.  Abdominal:     General:  Abdomen is flat. Bowel sounds are normal.     Palpations: Abdomen is soft.     Tenderness: There is no abdominal tenderness. There is no guarding.  Neurological:     Mental Status: She is alert and oriented to person, place, and time.  Psychiatric:        Mood and Affect: Mood normal.        Behavior: Behavior normal.      Assessment & Plan:   See Encounters Tab for problem based charting.  Patient discussed with Dr. Heber Ellijay

## 2020-10-14 NOTE — Assessment & Plan Note (Signed)
Patient presents for follow up on her T2DM. She takes Xultophy 40 units daily her her diabetes. She is compliant with her medications, and states that her meter readings average 90-120s at home, but did not bring it in. She has been doing well on Xultophy, but her A1c today is 7.4. Will have her schedule and appointment with the clinic in 4 weeks to check her meter before modifications can be made.  - Continue Xultophy 40 units - Follow up in 4 weeks for meter check.

## 2020-10-14 NOTE — Assessment & Plan Note (Signed)
Patient requesting referral to Orthopaedics.  - Ambulatory referral sent.

## 2020-10-15 LAB — HM DIABETES EYE EXAM

## 2020-10-28 ENCOUNTER — Encounter: Payer: Self-pay | Admitting: *Deleted

## 2020-10-30 NOTE — Progress Notes (Signed)
Internal Medicine Clinic Attending ? ?Case discussed with Dr. Winters  At the time of the visit.  We reviewed the resident?s history and exam and pertinent patient test results.  I agree with the assessment, diagnosis, and plan of care documented in the resident?s note.  ?

## 2020-10-31 ENCOUNTER — Other Ambulatory Visit: Payer: Self-pay | Admitting: Student

## 2020-10-31 DIAGNOSIS — F1190 Opioid use, unspecified, uncomplicated: Secondary | ICD-10-CM

## 2020-11-07 ENCOUNTER — Telehealth: Payer: Self-pay

## 2020-11-07 NOTE — Telephone Encounter (Signed)
Patient is enrolled in the [facility] PAP until 10/31/2021 for [employer]

## 2020-11-08 ENCOUNTER — Ambulatory Visit: Payer: Self-pay

## 2020-11-08 ENCOUNTER — Encounter: Payer: Self-pay | Admitting: Family Medicine

## 2020-11-08 ENCOUNTER — Ambulatory Visit: Payer: Medicare Other | Admitting: Family Medicine

## 2020-11-08 ENCOUNTER — Other Ambulatory Visit: Payer: Self-pay

## 2020-11-08 DIAGNOSIS — M25562 Pain in left knee: Secondary | ICD-10-CM

## 2020-11-08 DIAGNOSIS — M25561 Pain in right knee: Secondary | ICD-10-CM

## 2020-11-08 NOTE — Patient Instructions (Signed)
   Options for knee pain:  - Glucosamine sulfate:  1,000 mg twice daily  - Turmeric:  500 mg twice daily  - Vitamin D3:  2,000 IU daily   If pain gets worse:  - I can call in Voltaren Gel to rub on knee.  - I can call in anti-inflammatory pills.  - Can inject with cortisone  - Can inject with "gel"  - Can order a custom knee brace   Last option:  - Knee replacement.

## 2020-11-08 NOTE — Progress Notes (Signed)
Office Visit Note   Patient: Kristy [name]           Date of Birth: [birth year]           MRN: [id number] Visit Date: 11/08/2020 Requested by: Axel Filler, MD [address] [place],  [place] [address] PCP: Marianna Payment, MD  Subjective: Chief Complaint  Patient presents with  . Right Knee - Pain    S/p TKA 02/25/2011 by Dr. Ninfa Linden.   . Left Knee - Pain    Left knee hurts worse than the right    HPI: She is here left greater than right knee pain.  No injury, her knee started bothering her about a year ago.  Pain primarily on the medial aspect.  No locking or catching, no giving way.  She takes Tylenol as needed and it gives her a little bit of relief.  She is status post right knee replacement in 2012 per Dr. Ninfa Linden.  She has done pretty well with it.  It has never been pain-free but it has been functional.  It hurts a little, but nothing like the left.  Denies any hip pain.                ROS: She has diabetes, hypertension, renal artery stenosis.  All other systems were reviewed and are negative.  Objective: Vital Signs: LMP 01/05/2009   Physical Exam:  General:  Alert and oriented, in no acute distress. Pulm:  Breathing unlabored. Psy:  Normal mood, congruent affect. Skin: No rash. Knees: She has 2+ patellofemoral crepitus on the left, none on the right.  Trace effusion on the left.  No warmth or erythema.  Left knee has full active extension and flexion of about 125 degrees.  Right knee has full extension and flexion of 90 degrees.  Left knee is tender on the medial joint line and has slight laxity with valgus stress, still a solid endpoint.  No pain or click with McMurray's.  Imaging: XR Knee 1-2 Views Left  Result Date: 11/08/2020 X-rays of left knee reveal a probable osteochondroma on the proximal medial tibia.  There is near end-stage medial compartment DJD.  No sign of stress fracture or loose body.  XR Knee 1-2 Views Right  Result Date:  11/08/2020 X-rays of the right knee reveal intact prosthesis with no sign of loosening or infection.  Medially there is fragmentation near the tibial plateau, and both medial and lateral there appears to be calcifications possibly in the joint capsule.   Assessment & Plan: 1.  Left greater than right knee pain, most likely due to end-stage medial compartment DJD on the left.  She has a palpable osteochondroma but it does not seem to be symptomatic. -She is not interested in prescription medication.  We will try over-the-counter remedies.  If symptoms worsen, could try Voltaren gel topically, oral anti-inflammatories, steroid injection, gel injection or medial compartment unloading brace.  Knee replacement would be the last option. -I will ask Dr. Ninfa Linden to review her recent x-rays as well.     Procedures: No procedures performed        PMFS History: Patient Active Problem List   Diagnosis Date Noted  . Lower extremity edema 06/08/2019  . Osteoarthritis of both knees 07/29/2018  . Obstructive sleep apnea 10/07/2017  . Claudication ([place] [facility]) 10/07/2017  . CKD (chronic kidney disease) stage 3, GFR 30-59 ml/min ([facility]) 06/04/2017  . Hypercalcemia 06/26/2016  . Vitamin D deficiency 12/07/2014  . Moderate  major depression ([facility]) 08/09/2014  . Chronic, continuous use of opioids 03/14/2013  . 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 DM type 2 with diabetic peripheral neuropathy ([facility]) 12/06/2012  . Dyslipidemia 07/21/2006   Past Medical History:  Diagnosis Date  . Colon polyps   . Colon polyps   . Diabetes mellitus   . Diverticulosis   . Hyperlipidemia   . Hypertension   . 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 Albertine Patricia  . Sleep apnea 2007     status post polysomnogram 2007 , suggested use of CPAP  . Ventricular hypertrophy 2006    a 2-D echo in 2006, ejection fraction 55%, mild tricuspid regurgitation noted as well, 2-D echo November 14, 5679 showed diastolic dysfunction with LVEF normal of 65%    Family History  Problem Relation Age of Onset  . Diabetes Mother   . Cancer Mother   . Diabetes Father   . Cancer Other        breast cancer died in her 37s  . Bipolar disorder Son   . Bipolar disorder Daughter   . Alcohol abuse Son   . Drug abuse Son   . Breast cancer Cousin   . Stroke Neg Hx     Past Surgical History:  Procedure Laterality Date  . BREAST BIOPSY Right 2016  . RENAL ARTERY STENT  2007    patient is status post renal artery stent placement May 2007 and that was done secondary to renal artery stenosis, fibromuscular dysplasia  . REPLACEMENT TOTAL KNEE  02/2011-Dr. Ninfa Linden   S/P Right Total Knee Replacement May/2012   Social History   Occupational History  . Not on file  Tobacco Use  . Smoking status: Former Smoker    Packs/day: 0.33    Years: 25.00    Pack years: 8.25    Quit date: 10/26/2005    Years since quitting: 15.0  . Smokeless tobacco: Never Used  Substance and Sexual Activity  . Alcohol use: No    Alcohol/week: 0.0 standard drinks  . Drug use: No  . Sexual activity: Not on file

## 2020-11-13 ENCOUNTER — Other Ambulatory Visit: Payer: Self-pay

## 2020-11-13 ENCOUNTER — Encounter: Payer: Self-pay | Admitting: Internal Medicine

## 2020-11-13 ENCOUNTER — Ambulatory Visit (INDEPENDENT_AMBULATORY_CARE_PROVIDER_SITE_OTHER): Payer: Medicare Other | Admitting: Internal Medicine

## 2020-11-13 ENCOUNTER — Telehealth: Payer: Self-pay | Admitting: Dietician

## 2020-11-13 VITALS — BP 145/86 | HR 108 | Temp 98.5°F | Ht 63.0 in | Wt 213.8 lb

## 2020-11-13 DIAGNOSIS — M170 Bilateral primary osteoarthritis of knee: Secondary | ICD-10-CM | POA: Diagnosis not present

## 2020-11-13 DIAGNOSIS — G4733 Obstructive sleep apnea (adult) (pediatric): Secondary | ICD-10-CM | POA: Diagnosis not present

## 2020-11-13 DIAGNOSIS — E1142 Type 2 diabetes mellitus with diabetic polyneuropathy: Secondary | ICD-10-CM | POA: Diagnosis not present

## 2020-11-13 DIAGNOSIS — I150 Renovascular hypertension: Secondary | ICD-10-CM

## 2020-11-13 DIAGNOSIS — E785 Hyperlipidemia, unspecified: Secondary | ICD-10-CM

## 2020-11-13 DIAGNOSIS — N184 Chronic kidney disease, stage 4 (severe): Secondary | ICD-10-CM

## 2020-11-13 LAB — GLUCOSE, CAPILLARY: Glucose-Capillary: 172 mg/dL — ABNORMAL HIGH (ref 70–99)

## 2020-11-13 NOTE — Telephone Encounter (Signed)
She should be receiving it through [facility]. I see Kristy [name] had her approved through 10/31/21. I will follow-up with Kristy [name] tomorrow when she is at [facility]. Could be she is needing refills?

## 2020-11-13 NOTE — Patient Instructions (Signed)
Thank you, [place] for allowing [place] to provide your care today. Today we discussed Diabetes and blood pressure.    I have ordered the following labs for you:   Lab Orders     Glucose, capillary     BMP8+Anion Gap   Tests ordered today:  none  Referrals ordered today:    Referral Orders     Ambulatory referral to Sleep Studies   I have ordered the following medication/changed the following medications:   Stop the following medications: There are no discontinued medications.   Start the following medications: No orders of the defined types were placed in this encounter.    Follow up: 1 weekf or tele health visit to review your blood pressure and blood sugar log    Remember: to record you blood pressures and blood sugars.   Should you have any questions or concerns please call the internal medicine clinic at [phone].     Marianna Payment, D.O. [place]

## 2020-11-13 NOTE — Progress Notes (Signed)
CC: HTN  HPI:  [name] is a 66 y.o. female with a past medical history stated below and presents today for HTN. Please see problem based assessment and plan for additional details.  Past Medical History:  Diagnosis Date  . Colon polyps   . Colon polyps   . Diabetes mellitus   . Diverticulosis   . Hyperlipidemia   . Hypertension   . 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 Albertine [name]  . Sleep apnea 2007    status post polysomnogram 2007 , suggested use of CPAP  . Ventricular hypertrophy 2006    a 2-D echo in 2006, ejection fraction 55%, mild tricuspid regurgitation noted as well, 2-D echo November 13, 958 showed diastolic dysfunction with LVEF normal of 65%    Current Outpatient Medications on File Prior to Visit  Medication Sig Dispense Refill  . Accu-Chek FastClix Lancets MISC Check blood sugar two times a day 102 each 5  . amLODipine-atorvastatin (CADUET) 5-40 MG tablet Take 1 tablet by mouth daily. 90 tablet 3  . aspirin 81 MG EC tablet Take 81 mg by mouth daily.    . Blood Glucose Monitoring Suppl (ACCU-CHEK GUIDE) w/Device KIT 1 each by Does not apply route 2 (two) times daily. 1 kit 1  . carvedilol (COREG) 25 MG tablet Take 1 tablet (25 mg total) by mouth 2 (two) times daily. 180 tablet 3  . Cholecalciferol 1000 units capsule Take 1 capsule (1,000 Units total) by mouth daily. (Patient not taking: Reported on 11/14/2020) 30 capsule 2  . diclofenac sodium (VOLTAREN) 1 % GEL Apply 2 g topically 4 (four) times daily. (Patient not taking: Reported on 11/14/2020) 1 Tube 5  . glucose blood (ACCU-CHEK GUIDE) test strip Check blood sugar two times a day 100 each 5  . ibuprofen (ADVIL) 600 MG tablet Take 1 tablet (600 mg total) by mouth every 8 (eight) hours as needed  for moderate pain. 30 tablet 1  . Insulin Degludec-Liraglutide (XULTOPHY) 100-3.6 UNIT-MG/ML SOPN Inject 40 Units into the skin daily. 16 pen 2  . Insulin Pen Needle (B-D UF III MINI PEN NEEDLES) 31G X 5 MM MISC 1 Device by Does not apply route 3 (three) times daily before meals. Check daily 3X before meals 100 each 12  . Insulin Syringe-Needle U-100 (INSULIN SYRINGE .5CC/31GX5/16") 31G X 5/16" 0.5 ML MISC Inject 1 Dose into the skin daily. The patient is insulin requiring, ICD 10 code E11.42. Insulin 1 time daily 100 each 3  . losartan-hydrochlorothiazide (HYZAAR) 100-25 MG tablet Take 1 tablet by mouth daily. 90 tablet 3  . spironolactone (ALDACTONE) 50 MG tablet Take 1 tablet (50 mg total) by mouth daily. 90 tablet 3  . traMADol (ULTRAM) 50 MG tablet TAKE 2 TABLETS BY MOUTH EVERY 6 HOURS AS NEEDED FOR SEVERE PAIN 75 tablet 0   No current facility-administered medications on file prior to visit.    Family History  Problem Relation Age of Onset  . Diabetes Mother   . Cancer Mother   . Diabetes Father   . Cancer Other        breast cancer died in her 4s  . Bipolar disorder Son   . Hypertension Daughter   . Stroke Daughter   . Alcohol abuse Son   . Drug  abuse Son   . Breast cancer Cousin   . Hypertension Sister   . Hypertension Sister   . Hypertension Daughter   . Hypertension Daughter   . Hypertension Daughter     Social History   Socioeconomic History  . Marital status: Married    Spouse name: Not on file  . Number of children: Not on file  . Years of education: 7  . Highest education level: Not on file  Occupational History  . Not on file  Tobacco Use  . Smoking status: Former Smoker    Packs/day: 0.33    Years: 25.00    Pack years: 8.25    Quit date: 10/26/2005    Years since quitting: 15.0  . Smokeless tobacco: Never Used  Substance and Sexual Activity  . Alcohol use: No    Alcohol/week: 0.0 standard drinks  . Drug use: No  . Sexual activity: Not on file   Other Topics Concern  . Not on file  Social History Narrative   Current Social History 11/14/2020        Patient lives with spouse in a home which is 1 story. There are steps up to the entrance with a handrail which the patient uses.       Patient's method of transportation is personal car.      The highest level of education was some high school, 11th grade.      The patient currently disabled.      Identified important Relationships are "my husband"       Pets : a dog, Research scientist (medical) / Fun: "Nothing since Covid"       Current Stressors: None       Religious / Personal Beliefs: Non-Christian       Other: None    Social Determinants of Health   Financial Resource Strain: Not on file  Food Insecurity: Not on file  Transportation Needs: Not on file  Physical Activity: Not on file  Stress: Not on file  Social Connections: Not on file  Intimate Partner Violence: Not on file    Review of Systems: ROS negative except for what is noted on the assessment and plan.  Vitals:   11/13/20 1354  BP: (!) 145/86  Pulse: (!) 108  Temp: 98.5 F (36.9 C)  TempSrc: Oral  SpO2: 94%  Weight: 213 lb 12.8 oz (97 kg)  Height: $Remove'5\' 3"'cbjqiPI$  (1.6 m)     Physical Exam: Physical Exam Constitutional:      Appearance: Normal appearance.  HENT:     Head: Normocephalic and atraumatic.  Eyes:     Extraocular Movements: Extraocular movements intact.  Cardiovascular:     Rate and Rhythm: Normal rate.     Pulses: Normal pulses.     Heart sounds: Normal heart sounds.  Pulmonary:     Effort: Pulmonary effort is normal.     Breath sounds: Normal breath sounds.  Abdominal:     General: Bowel sounds are normal.     Palpations: Abdomen is soft.     Tenderness: There is no abdominal tenderness.  Musculoskeletal:        General: Normal range of motion.     Cervical back: Normal range of motion.     Right lower leg: Edema (trace) present.     Left lower leg: Edema (trace) present.   Skin:    General: Skin is warm and dry.  Neurological:     Mental Status: She is  alert and oriented to person, place, and time. Mental status is at baseline.  Psychiatric:        Mood and Affect: Mood normal.      Assessment & Plan:   See Encounters Tab for problem based charting.  Patient discussed with Dr. Lars Mage, D.O. [facility], PGY-2 Pager: [phone], Phone: [phone] Date 11/15/2020 Time 10:19 AM

## 2020-11-13 NOTE — Telephone Encounter (Signed)
Kristy [name] asks about a refill for her xultophy. She says she has 2 pens left and that [facility] does not have any to refill it for her.

## 2020-11-14 ENCOUNTER — Other Ambulatory Visit: Payer: Self-pay

## 2020-11-14 ENCOUNTER — Ambulatory Visit (INDEPENDENT_AMBULATORY_CARE_PROVIDER_SITE_OTHER): Payer: Medicare Other | Admitting: Internal Medicine

## 2020-11-14 ENCOUNTER — Encounter: Payer: Self-pay | Admitting: Internal Medicine

## 2020-11-14 DIAGNOSIS — Z0000 Encounter for general adult medical examination without abnormal findings: Secondary | ICD-10-CM | POA: Diagnosis not present

## 2020-11-14 LAB — BMP8+ANION GAP
Anion Gap: 14 mmol/L (ref 10.0–18.0)
BUN/Creatinine Ratio: 19 (ref 12–28)
BUN: 39 mg/dL — ABNORMAL HIGH (ref 8–27)
CO2: 18 mmol/L — ABNORMAL LOW (ref 20–29)
Calcium: 10.4 mg/dL — ABNORMAL HIGH (ref 8.7–10.3)
Chloride: 106 mmol/L (ref 96–106)
Creatinine, Ser: 2.05 mg/dL — ABNORMAL HIGH (ref 0.57–1.00)
GFR calc Af Amer: 29 mL/min/{1.73_m2} — ABNORMAL LOW (ref >59)
GFR calc non Af Amer: 25 mL/min/{1.73_m2} — ABNORMAL LOW (ref >59)
Glucose: 147 mg/dL — ABNORMAL HIGH (ref 65–99)
Potassium: 4.9 mmol/L (ref 3.5–5.2)
Sodium: 138 mmol/L (ref 134–144)

## 2020-11-14 NOTE — Progress Notes (Signed)
This AWV is being conducted by [facility] only. The patient was located at home and I was located in [facility]. The patient's identity was confirmed using their DOB and current address. The patient or his/her legal guardian has consented to being evaluated through a telephone encounter and understands the associated risks (an examination cannot be done and the patient may need to come in for an appointment) / benefits (allows the patient to remain at home, decreasing exposure to coronavirus). I personally spent 30 minutes conducting the AWV.  Subjective:   Kristy [name] is a 65 y.o. female who presents for a Medicare Annual Wellness Visit.  The following items have been reviewed and updated today in the appropriate area in the EMR.   Health Risk Assessment  Height, weight, BMI, and BP Visual acuity if needed Depression screen Fall risk / safety level Advance directive discussion Medical and family history were reviewed and updated Updating list of other providers & suppliers Medication reconciliation, including over the counter medicines Cognitive screen Written screening schedule Risk Factor list Personalized health advice, risky behaviors, and treatment advice  Social History   Social History Narrative   Current Social History 11/14/2020        Patient lives with spouse in a home which is 1 story. There are steps up to the entrance with a handrail which the patient uses.       Patient's method of transportation is personal car.      The highest level of education was some high school, 11th grade.      The patient currently disabled.      Identified important Relationships are "my husband"       Pets : a dog, Research scientist (medical) / Fun: "Nothing since Covid"       Current Stressors: None       Religious / Personal Beliefs: Non-Christian       Other: None          Objective:    Vitals: LMP 01/05/2009  Vitals are unable to obtained due to [id number] public  health emergency  Activities of Daily Living In your present state of health, do you have any difficulty performing the following activities: 11/14/2020 11/13/2020  Hearing? N N  Vision? N N  Difficulty concentrating or making decisions? N N  Walking or climbing stairs? N N  Dressing or bathing? N N  Doing errands, shopping? N N  Some recent data might be hidden    Goals Goals    .  Blood Pressure < 140/90    .  HEMOGLOBIN A1C < 7.0    .  Increase physical activity (pt-stated)      Patient will start using exercise band 3X/wk       Fall Risk Fall Risk  11/13/2020 10/14/2020 09/23/2020 07/15/2020 04/04/2020  Falls in the past year? 0 0 0 0 0  Number falls in past yr: - - - - 0  Injury with Fall? - - - - -  Comment - - - - -  Risk for fall due to : - No Fall Risks Impaired mobility Impaired balance/gait Impaired balance/gait  Risk for fall due to: Comment - - - - -  Follow up - - Falls prevention discussed Falls evaluation completed Falls evaluation completed    Depression Screen PHQ 2/9 Scores 11/14/2020 11/13/2020 10/14/2020 09/23/2020  PHQ - 2 Score 1 2 3 2   PHQ- 9 Score 1 2 7  3  Some encounter information is confidential and restricted. Go to Review Flowsheets activity to see all data.     Cognitive Testing Six-Item Cognitive Screener   "I would like to ask you some questions that ask you to use your memory. I am going to name three objects. Please wait until I say all three words, then repeat them. Remember what they are  because I am going to ask you to name them again in a few minutes. Please repeat these words for me: APPLE--TABLE--PENNY." (Interviewer may repeat names 3 times if necessary but repetition not scored.)  Did patient correctly repeat all three words? Yes - may proceed with screen  What year is this? Correct What month is this? Correct What day of the week is this? Correct  What were the three objects I asked you to remember? . Apple Correct . Table  Correct . Penny Correct  Score one point for each incorrect answer.  A score of 2 or more points warrants additional investigation.  Patient's score 0     Assessment and Plan:    During the course of the visit the patient was educated and counseled about appropriate screening and preventive services as documented in the assessment and plan.  The patient is due for a colonoscopy according to care gaps, she was instructed to call [facility] for an appointment.  She was seen in the office yesterday and has a f/u appt in 2 weeks for a b/p check.  She will record all b/p's and will be sent a b/p log today.  CDC Handout on Fall Prevention and Handout on Home Exercise Program, Access codes [id number] and [id number] given/mailed to patient with exercise band.   The printed AVS was given to the patient and included an updated screening schedule, a list of risk factors, and personalized health advice.        Higinio Roger, RN  11/14/2020

## 2020-11-14 NOTE — Telephone Encounter (Signed)
Thank you both!

## 2020-11-14 NOTE — Telephone Encounter (Signed)
Per [facility], the fill was processed on 11/05/20 and shipment of the medication will take 10-14 business days.  I wouldn't expect the medication to be delivered until 11/25/20 at the latest.

## 2020-11-14 NOTE — Patient Instructions (Addendum)
Annual Wellness Visit   Medicare Covered Preventative Screenings and Services  Services & Screenings Men and Women Who How Often Need? Date of Last Service Action  Abdominal Aortic Aneurysm Adults with AAA risk factors Once     Alcohol Misuse and Counseling All Adults Screening once a year if no alcohol misuse. Counseling up to 4 face to face sessions.     Bone Density Measurement  Adults at risk for osteoporosis Once every 2 yrs     Lipid Panel Z13.6 All adults without CV disease Once every 5 yrs     Colorectal Cancer   Stool sample or  Colonoscopy All adults 37 and older   Once every year  Every 10 years    Last Colonoscopy in 2011 with [place]  Depression All Adults Once a year  Today   Diabetes Screening Blood glucose, post glucose load, or GTT Z13.1  All adults at risk  Pre-diabetics  Once per year  Twice per year     Diabetes  Self-Management Training All adults Diabetics 10 hrs first year; 2 hours subsequent years. Requires Copay     Glaucoma  Diabetics  Family history of glaucoma  African Americans 49 yrs +  Hispanic Americans 9 yrs + Annually - requires coppay     Hepatitis C Z72.89 or F19.20  High Risk for HCV  Born between 1945 and 1965  Annually  Once     HIV Z11.4 All adults based on risk  Annually btw ages 77 & 72 regardless of risk  Annually > 65 yrs if at increased risk     Lung Cancer Screening Asymptomatic adults aged 17-77 with 30 pack yr history and current smoker OR quit within the last 15 yrs Annually Must have counseling and shared decision making documentation before first screen     Medical Nutrition Therapy Adults with   Diabetes  Renal disease  Kidney transplant within past 3 yrs 3 hours first year; 2 hours subsequent years     Obesity and Counseling All adults Screening once a year Counseling if BMI 30 or higher  Today   Tobacco Use Counseling Adults who use tobacco  Up to 8 visits in one year     Vaccines Z23  Hepatitis  B  Influenza   Pneumonia  Adults   Once  Once every flu season  Two different vaccines separated by one year     Next Annual Wellness Visit People with Medicare Every year  Today     Services & Screenings Women Who How Often Need  Date of Last Service Action  Mammogram  Z12.31 Women over 26 One baseline ages 105-39. Annually ager 40 yrs+     Pap tests All women Annually if high risk. Every 2 yrs for normal risk women     Screening for cervical cancer with   Pap (Z01.419 nl or Z01.411abnl) &  HPV Z11.51 Women aged 72 to 37 Once every 5 yrs     Screening pelvic and breast exams All women Annually if high risk. Every 2 yrs for normal risk women     Sexually Transmitted Diseases  Chlamydia  Gonorrhea  Syphilis All at risk adults Annually for non pregnant females at increased risk         [place]oneto Men Who How Ofter Need  Date of Last Service Action  Prostate Cancer - DRE & PSA Men over 50 Annually.  DRE might require a copay.     Sexually Transmitted Diseases  Syphilis All at risk adults Annually for men at increased risk         Things That May Be Affecting Your Health:  Alcohol  Hearing loss  Pain    Depression  Home Safety  Sexual Health   Diabetes X Lack of physical activity  Stress   Difficulty with daily activities  Loneliness  Tiredness   Drug use  Medicines  Tobacco use   Falls  Motor Vehicle Safety  Weight   Food choices  Oral Health  Other    YOUR PERSONALIZED HEALTH PLAN : 1. Schedule your next subsequent Medicare Wellness visit in one year 2. Attend all of your regular appointments to address your medical issues 3. Complete the preventative screenings and services 4. Call [facility] to schedule your next Colonoscopy [phone]) 5. Congratulations on setting a goal of increasing your activity level!  Start using the enclosed exercise band 3 times a week (intructions for various exercises included) 6.  Please record  your blood pressures on the enclosed log and bring them with you to your next scheduled appointment.   Colonoscopy, Adult A colonoscopy is a procedure to look at the entire large intestine. This procedure is done using a long, thin, flexible tube that has a camera on the end. You may have a colonoscopy:  As a part of normal colorectal screening.  If you have certain symptoms, such as: ? A low number of red blood cells in your blood (anemia). ? Diarrhea that does not go away. ? Pain in your abdomen. ? Blood in your stool. A colonoscopy can help screen for and diagnose medical problems, including:  Tumors.  Extra tissue that grows where mucus forms (polyps).  Inflammation.  Areas of bleeding. Tell your health care provider about:  Any allergies you have.  All medicines you are taking, including vitamins, herbs, eye drops, creams, and over-the-counter medicines.  Any problems you or family members have had with anesthetic medicines.  Any blood disorders you have.  Any surgeries you have had.  Any medical conditions you have.  Any problems you have had with having bowel movements.  Whether you are pregnant or may be pregnant. What are the risks? Generally, this is a safe procedure. However, problems may occur, including:  Bleeding.  Damage to your intestine.  Allergic reactions to medicines given during the procedure.  Infection. This is rare. What happens before the procedure? Eating and drinking restrictions Follow instructions from your health care provider about eating or drinking restrictions, which may include:  A few days before the procedure: ? Follow a low-fiber diet. ? Avoid nuts, seeds, dried fruit, raw fruits, and vegetables.  1-3 days before the procedure: ? Eat only gelatin dessert or ice pops. ? Drink only clear liquids, such as water, clear juice, clear broth or bouillon, black coffee or tea, or clear soft drinks or sports drinks. ? Avoid  liquids that contain red or purple dye.  The day of the procedure: ? Do not eat solid foods. You may continue to drink clear liquids until up to 2 hours before the procedure. ? Do not eat or drink anything starting 2 hours before the procedure, or within the time period that your health care provider recommends. Bowel prep If you were prescribed a bowel prep to take by mouth (orally) to clean out your colon:  Take it as told by your health care provider. Starting the day before your procedure, you will need to drink a large amount of liquid  medicine. The liquid will cause you to have many bowel movements of loose stool until your stool becomes almost clear or light green.  If your skin or the opening between the buttocks (anus) gets irritated from diarrhea, you may relieve the irritation using: ? Wipes with medicine in them, such as adult wet wipes with aloe and vitamin E. ? A product to soothe skin, such as petroleum jelly.  If you vomit while drinking the bowel prep: ? Take a break for up to 60 minutes. ? Begin the bowel prep again. ? Call your health care provider if you keep vomiting or you cannot take the bowel prep without vomiting.  To clean out your colon, you may also be given: ? Laxative medicines. These help you have a bowel movement. ? Instructions for enema use. An enema is liquid medicine injected into your rectum. Medicines Ask your health care provider about:  Changing or stopping your regular medicines or supplements. This is especially important if you are taking iron supplements, diabetes medicines, or blood thinners.  Taking medicines such as aspirin and ibuprofen. These medicines can thin your blood. Do not take these medicines unless your health care provider tells you to take them.  Taking over-the-counter medicines, vitamins, herbs, and supplements. General instructions  Ask your health care provider what steps will be taken to help prevent infection. These may  include washing skin with a germ-killing soap.  Plan to have someone take you home from the hospital or clinic. What happens during the procedure?  An IV will be inserted into one of your veins.  You may be given one or more of the following: ? A medicine to help you relax (sedative). ? A medicine to numb the area (local anesthetic). ? A medicine to make you fall asleep (general anesthetic). This is rarely needed.  You will lie on your side with your knees bent.  The tube will: ? Have oil or gel put on it (be lubricated). ? Be inserted into your anus. ? Be gently eased through all parts of your large intestine.  Air will be sent into your colon to keep it open. This may cause some pressure or cramping.  Images will be taken with the camera and will appear on a screen.  A small tissue sample may be removed to be looked at under a microscope (biopsy). The tissue may be sent to a lab for testing if any signs of problems are found.  If small polyps are found, they may be removed and checked for cancer cells.  When the procedure is finished, the tube will be removed. The procedure may vary among health care providers and hospitals.   What happens after the procedure?  Your blood pressure, heart rate, breathing rate, and blood oxygen level will be monitored until you leave the hospital or clinic.  You may have a small amount of blood in your stool.  You may pass gas and have mild cramping or bloating in your abdomen. This is caused by the air that was used to open your colon during the exam.  Do not drive for 24 hours after the procedure.  It is up to you to get the results of your procedure. Ask your health care provider, or the department that is doing the procedure, when your results will be ready. Summary  A colonoscopy is a procedure to look at the entire large intestine.  Follow instructions from your health care provider about eating and drinking before the  procedure.  If you were prescribed an oral bowel prep to clean out your colon, take it as told by your health care provider.  During the colonoscopy, a flexible tube with a camera on its end is inserted into the anus and then passed into the other parts of the large intestine. This information is not intended to replace advice given to you by your health care provider. Make sure you discuss any questions you have with your health care provider. Document Revised: 05/05/2019 Document Reviewed: 05/05/2019 Elsevier Patient Education  [place].   Diabetes Mellitus and [place] care is an important part of your health, especially when you have diabetes. Diabetes may cause you to have problems because of poor blood flow (circulation) to your feet and legs, which can cause your skin to:  Become thinner and drier.  Break more easily.  Heal more slowly.  Peel and crack. You may also have nerve damage (neuropathy) in your legs and feet, causing decreased feeling in them. This means that you may not notice minor injuries to your feet that could lead to more serious problems. Noticing and addressing any potential problems early is the best way to prevent future foot problems. How to care for your feet Foot hygiene  Wash your feet daily with warm water and mild soap. Do not use hot water. Then, pat your feet and the areas between your toes until they are completely dry. Do not soak your feet as this can dry your skin.  Trim your toenails straight across. Do not dig under them or around the cuticle. File the edges of your nails with an emery board or nail file.  Apply a moisturizing lotion or petroleum jelly to the skin on your feet and to dry, brittle toenails. Use lotion that does not contain alcohol and is unscented. Do not apply lotion between your toes.   Shoes and socks  Wear clean socks or stockings every day. Make sure they are not too tight. Do not wear knee-high stockings  since they may decrease blood flow to your legs.  Wear shoes that fit properly and have enough cushioning. Always look in your shoes before you put them on to be sure there are no objects inside.  To break in new shoes, wear them for just a few hours a day. This prevents injuries on your feet. Wounds, scrapes, corns, and calluses  Check your feet daily for blisters, cuts, bruises, sores, and redness. If you cannot see the bottom of your feet, use a mirror or ask someone for help.  Do not cut corns or calluses or try to remove them with medicine.  If you find a minor scrape, cut, or break in the skin on your feet, keep it and the skin around it clean and dry. You may clean these areas with mild soap and water. Do not clean the area with peroxide, alcohol, or iodine.  If you have a wound, scrape, corn, or callus on your foot, look at it several times a day to make sure it is healing and not infected. Check for: ? Redness, swelling, or pain. ? Fluid or blood. ? Warmth. ? Pus or a bad smell.   General tips  Do not cross your legs. This may decrease blood flow to your feet.  Do not use heating pads or hot water bottles on your feet. They may burn your skin. If you have lost feeling in your feet or legs, you may not know this  is happening until it is too late.  Protect your feet from hot and cold by wearing shoes, such as at the beach or on hot pavement.  Schedule a complete foot exam at least once a year (annually) or more often if you have foot problems. Report any cuts, sores, or bruises to your health care provider immediately. Where to find more information  American Diabetes Association: www.diabetes.org  Association of Diabetes Care & Education Specialists: www.diabeteseducator.org Contact a health care provider if:  You have a medical condition that increases your risk of infection and you have any cuts, sores, or bruises on your feet.  You have an injury that is not  healing.  You have redness on your legs or feet.  You feel burning or tingling in your legs or feet.  You have pain or cramps in your legs and feet.  Your legs or feet are numb.  Your feet always feel cold.  You have pain around any toenails. Get help right away if:  You have a wound, scrape, corn, or callus on your foot and: ? You have pain, swelling, or redness that gets worse. ? You have fluid or blood coming from the wound, scrape, corn, or callus. ? Your wound, scrape, corn, or callus feels warm to the touch. ? You have pus or a bad smell coming from the wound, scrape, corn, or callus. ? You have a fever. ? You have a red line going up your leg. Summary  Check your feet every day for blisters, cuts, bruises, sores, and redness.  Apply a moisturizing lotion or petroleum jelly to the skin on your feet and to dry, brittle toenails.  Wear shoes that fit properly and have enough cushioning.  If you have foot problems, report any cuts, sores, or bruises to your health care provider immediately.  Schedule a complete foot exam at least once a year (annually) or more often if you have foot problems. This information is not intended to replace advice given to you by your health care provider. Make sure you discuss any questions you have with your health care provider. Document Revised: 05/02/2020 Document Reviewed: 05/02/2020 Elsevier Patient Education  2021 [place] Prevention in the Home, Adult Falls can cause injuries and can happen to people of all ages. There are many things you can do to make your home safe and to help prevent falls. Ask for help when making these changes. What actions can I take to prevent falls? General Instructions  Use good lighting in all rooms. Replace any light bulbs that burn out.  Turn on the lights in dark areas. Use night-lights.  Keep items that you use often in easy-to-reach places. Lower the shelves around your home if  needed.  Set up your furniture so you have a clear path. Avoid moving your furniture around.  Do not have throw rugs or other things on the floor that can make you trip.  Avoid walking on wet floors.  If any of your floors are uneven, fix them.  Add color or contrast paint or tape to clearly mark and help you see: ? Grab bars or handrails. ? First and last steps of staircases. ? Where the edge of each step is.  If you use a stepladder: ? Make sure that it is fully opened. Do not climb a closed stepladder. ? Make sure the sides of the stepladder are locked in place. ? Ask someone to hold the stepladder while you use it.  Know where your pets are when moving through your home. What can I do in the bathroom?  Keep the floor dry. Clean up any water on the floor right away.  Remove soap buildup in the tub or shower.  Use nonskid mats or decals on the floor of the tub or shower.  Attach bath mats securely with double-sided, nonslip rug tape.  If you need to sit down in the shower, use a plastic, nonslip stool.  Install grab bars by the toilet and in the tub and shower. Do not use towel bars as grab bars.      What can I do in the bedroom?  Make sure that you have a light by your bed that is easy to reach.  Do not use any sheets or blankets for your bed that hang to the floor.  Have a firm chair with side arms that you can use for support when you get dressed. What can I do in the kitchen?  Clean up any spills right away.  If you need to reach something above you, use a step stool with a grab bar.  Keep electrical cords out of the way.  Do not use floor polish or wax that makes floors slippery. What can I do with my stairs?  Do not leave any items on the stairs.  Make sure that you have a light switch at the top and the bottom of the stairs.  Make sure that there are handrails on both sides of the stairs. Fix handrails that are broken or loose.  Install nonslip  stair treads on all your stairs.  Avoid having throw rugs at the top or bottom of the stairs.  Choose a carpet that does not hide the edge of the steps on the stairs.  Check carpeting to make sure that it is firmly attached to the stairs. Fix carpet that is loose or worn. What can I do on the outside of my home?  Use bright outdoor lighting.  Fix the edges of walkways and driveways and fix any cracks.  Remove anything that might make you trip as you walk through a door, such as a raised step or threshold.  Trim any bushes or trees on paths to your home.  Check to see if handrails are loose or broken and that both sides of all steps have handrails.  Install guardrails along the edges of any raised decks and porches.  Clear paths of anything that can make you trip, such as tools or rocks.  Have leaves, snow, or ice cleared regularly.  Use sand or salt on paths during winter.  Clean up any spills in your garage right away. This includes grease or oil spills. What other actions can I take?  Wear shoes that: ? Have a low heel. Do not wear high heels. ? Have rubber bottoms. ? Feel good on your feet and fit well. ? Are closed at the toe. Do not wear open-toe sandals.  Use tools that help you move around if needed. These include: ? Canes. ? Walkers. ? Scooters. ? Crutches.  Review your medicines with your doctor. Some medicines can make you feel dizzy. This can increase your chance of falling. Ask your doctor what else you can do to help prevent falls. Where to find more information  Centers for Disease Control and Prevention, [employer]AD[employer]: http://www.wolf.info/  National Institute on Aging: http://kim-miller.com/ Contact a doctor if:  You are afraid of falling at home.  You feel weak, drowsy, or  dizzy at home.  You fall at home. Summary  There are many simple things that you can do to make your home safe and to help prevent falls.  Ways to make your home safe include removing things  that can make you trip and installing grab bars in the bathroom.  Ask for help when making these changes in your home. This information is not intended to replace advice given to you by your health care provider. Make sure you discuss any questions you have with your health care provider. Document Revised: 05/15/2020 Document Reviewed: 05/15/2020 Elsevier Patient Education  [place] Maintenance, Female Adopting a healthy lifestyle and getting preventive care are important in promoting health and wellness. Ask your health care provider about:  The right schedule for you to have regular tests and exams.  Things you can do on your own to prevent diseases and keep yourself healthy. What should I know about diet, weight, and exercise? Eat a healthy diet  Eat a diet that includes plenty of vegetables, fruits, low-fat dairy products, and lean protein.  Do not eat a lot of foods that are high in solid fats, added sugars, or sodium.   Maintain a healthy weight Body mass index (BMI) is used to identify weight problems. It estimates body fat based on height and weight. Your health care provider can help determine your BMI and help you achieve or maintain a healthy weight. Get regular exercise Get regular exercise. This is one of the most important things you can do for your health. Most adults should:  Exercise for at least 150 minutes each week. The exercise should increase your heart rate and make you sweat (moderate-intensity exercise).  Do strengthening exercises at least twice a week. This is in addition to the moderate-intensity exercise.  Spend less time sitting. Even light physical activity can be beneficial. Watch cholesterol and blood lipids Have your blood tested for lipids and cholesterol at 65 years of age, then have this test every 5 years. Have your cholesterol levels checked more often if:  Your lipid or cholesterol levels are high.  You are older than 65  years of age.  You are at high risk for heart disease. What should I know about cancer screening? Depending on your health history and family history, you may need to have cancer screening at various ages. This may include screening for:  Breast cancer.  Cervical cancer.  Colorectal cancer.  Skin cancer.  Lung cancer. What should I know about heart disease, diabetes, and high blood pressure? Blood pressure and heart disease  High blood pressure causes heart disease and increases the risk of stroke. This is more likely to develop in people who have high blood pressure readings, are of African descent, or are overweight.  Have your blood pressure checked: ? Every 3-5 years if you are 48-65 years of age. ? Every year if you are 57 years old or older. Diabetes Have regular diabetes screenings. This checks your fasting blood sugar level. Have the screening done:  Once every three years after age 51 if you are at a normal weight and have a low risk for diabetes.  More often and at a younger age if you are overweight or have a high risk for diabetes. What should I know about preventing infection? Hepatitis B If you have a higher risk for hepatitis B, you should be screened for this virus. Talk with your health care provider to find out if you are  at risk for hepatitis B infection. Hepatitis C Testing is recommended for:  Everyone born from [birth year] through 1965.  Anyone with known risk factors for hepatitis C. Sexually transmitted infections (STIs)  Get screened for STIs, including gonorrhea and chlamydia, if: ? You are sexually active and are younger than 65 years of age. ? You are older than 65 years of age and your health care provider tells you that you are at risk for this type of infection. ? Your sexual activity has changed since you were last screened, and you are at increased risk for chlamydia or gonorrhea. Ask your health care provider if you are at risk.  Ask your health  care provider about whether you are at high risk for HIV. Your health care provider may recommend a prescription medicine to help prevent HIV infection. If you choose to take medicine to prevent HIV, you should first get tested for HIV. You should then be tested every 3 months for as long as you are taking the medicine. Pregnancy  If you are about to stop having your period (premenopausal) and you may become pregnant, seek counseling before you get pregnant.  Take 400 to 800 micrograms (mcg) of folic acid every day if you become pregnant.  Ask for birth control (contraception) if you want to prevent pregnancy. Osteoporosis and menopause Osteoporosis is a disease in which the bones lose minerals and strength with aging. This can result in bone fractures. If you are 58 years old or older, or if you are at risk for osteoporosis and fractures, ask your health care provider if you should:  Be screened for bone loss.  Take a calcium or vitamin D supplement to lower your risk of fractures.  Be given hormone replacement therapy (HRT) to treat symptoms of menopause. Follow these instructions at home: Lifestyle  Do not use any products that contain nicotine or tobacco, such as cigarettes, e-cigarettes, and chewing tobacco. If you need help quitting, ask your health care provider.  Do not use street drugs.  Do not share needles.  Ask your health care provider for help if you need support or information about quitting drugs. Alcohol use  Do not drink alcohol if: ? Your health care provider tells you not to drink. ? You are pregnant, may be pregnant, or are planning to become pregnant.  If you drink alcohol: ? Limit how much you use to 0-1 drink a day. ? Limit intake if you are breastfeeding.  Be aware of how much alcohol is in your drink. In the [place] one drink equals one 12 oz bottle of beer (355 mL), one 5 oz glass of wine (148 mL), or one 1 oz glass of hard liquor (44 mL). General  instructions  Schedule regular health, dental, and eye exams.  Stay current with your vaccines.  Tell your health care provider if: ? You often feel depressed. ? You have ever been abused or do not feel safe at home. Summary  Adopting a healthy lifestyle and getting preventive care are important in promoting health and wellness.  Follow your health care provider's instructions about healthy diet, exercising, and getting tested or screened for diseases.  Follow your health care provider's instructions on monitoring your cholesterol and blood pressure. This information is not intended to replace advice given to you by your health care provider. Make sure you discuss any questions you have with your health care provider. Document Revised: 10/05/2018 Document Reviewed: 10/05/2018 Elsevier Patient Education  2021 [employer].

## 2020-11-15 ENCOUNTER — Encounter: Payer: Self-pay | Admitting: Internal Medicine

## 2020-11-15 NOTE — Assessment & Plan Note (Signed)
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  Recent BMP shows a low bicarb of 18.  She will likely need supplementation in the near future with sodium bicarb.  This will need to be discussed with her to follow-up appointment.

## 2020-11-15 NOTE — Assessment & Plan Note (Signed)
Patient recently saw the orthopedist on 10/14/2020.  She states that she was started on glucosamine and turmeric.  Otherwise she denies any complaints at this time.

## 2020-11-15 NOTE — Progress Notes (Signed)
Internal Medicine Clinic Attending  Case discussed with Dr.  Coe . We reviewed the AWV findings.  I agree with the assessment, diagnosis, and plan of care documented in the AWV note.     

## 2020-11-15 NOTE — Assessment & Plan Note (Signed)
Is a history of obstructive sleep apnea states that she is not using her CPAP.  I spent majority of my time counseling her on the importance of using her CPAP as a means of controlling her hypertension.  She states that the largest barrier for her is not having a mask that fits her appropriately.  I discussed following up with the sleep center to get refitted for CPAP mask and encouraged her to be consistent with using her CPAP machine.  Plan: -Refer to sleep medicine for mask refitting.

## 2020-11-15 NOTE — Assessment & Plan Note (Signed)
Patient has history of hyperlipidemia on ASA 81 mg and atorvastatin 40 mg.  It appears the patient was started on aspirin 81 mg as a result for fibromuscular dysplasia.  We will continue to monitor this closely and assess her risk of bleeding determined the use of aspirin in the future

## 2020-11-15 NOTE — Assessment & Plan Note (Addendum)
Patient presents for reevaluation of her diabetes.  Patient's last A1c on 10/14/2020 was 7.4 which is close to goal of less than 7.  Her last eye exam was 10/15/2020 showing retinopathy and her last foot exam was on 03/2020 with no signs of neuropathy.  She is currently using xultophy 40 units daily and is seeing Butch Penny regularly to help with prescription refills and assistance.  States that she is taking her blood sugar every morning.  Asked her to complete a blood glucose log over the next week so that we can review it at her 1 week telehealth visit and make adjustments as needed.  Plan: -Patient will write down a blood glucose log over the next week and follow-up in 1 weeks to make adjustments on her diabetic regimen.

## 2020-11-15 NOTE — Assessment & Plan Note (Signed)
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  Her blood pressure today is 145/86.  Patient states that she does have a blood pressure cuff at home and does take her blood pressure but is on sure what its been running at home.  I counseled her regarding checking her blood pressure daily and following up with our clinic in 1 week via telehealth visit to discuss her blood pressure trend and make adjustments to her medications as needed.  Plan: -Blood pressure log over the next week and 1 week telehealth follow-up. -We will check BMP for kidney function and discuss her results at her follow-up appointment.

## 2020-11-19 NOTE — Progress Notes (Signed)
Internal Medicine Clinic Attending  Case discussed with Dr. Coe  At the time of the visit.  We reviewed the resident's history and exam and pertinent patient test results.  I agree with the assessment, diagnosis, and plan of care documented in the resident's note.  

## 2020-11-21 ENCOUNTER — Ambulatory Visit: Payer: Medicare Other | Admitting: Internal Medicine

## 2020-11-21 ENCOUNTER — Other Ambulatory Visit: Payer: Self-pay

## 2020-11-21 NOTE — Progress Notes (Signed)
I discussed the AWV findings with the RN who conducted the visit. I was present in the office suite and immediately available to provide assistance and direction throughout the time the service was provided.  Lawerance Cruel, D.O.  Internal Medicine Resident, PGY-2 [facility]

## 2020-11-21 NOTE — Progress Notes (Signed)
Attempted to call patient at 2:43pm without answer. I attempted to call her a second time at 3:04 without answer. I was unable to leave a voice mail. I will have her reschedule an appointment in the near future to review her glucometer and make adjustments to her DM medications.

## 2020-11-23 ENCOUNTER — Emergency Department (HOSPITAL_COMMUNITY): Payer: Medicare Other

## 2020-11-23 ENCOUNTER — Inpatient Hospital Stay (HOSPITAL_COMMUNITY)
Admission: EM | Admit: 2020-11-23 | Discharge: 2020-11-26 | DRG: 177 | Disposition: A | Discharge status: Home Health | Payer: Medicare Other | Attending: Internal Medicine | Admitting: Internal Medicine

## 2020-11-23 ENCOUNTER — Encounter (HOSPITAL_COMMUNITY): Payer: Self-pay

## 2020-11-23 ENCOUNTER — Other Ambulatory Visit: Payer: Self-pay

## 2020-11-23 DIAGNOSIS — E1142 Type 2 diabetes mellitus with diabetic polyneuropathy: Secondary | ICD-10-CM | POA: Diagnosis present

## 2020-11-23 DIAGNOSIS — J1282 Pneumonia due to coronavirus disease 2019: Secondary | ICD-10-CM | POA: Diagnosis present

## 2020-11-23 DIAGNOSIS — E1122 Type 2 diabetes mellitus with diabetic chronic kidney disease: Secondary | ICD-10-CM | POA: Diagnosis present

## 2020-11-23 DIAGNOSIS — N179 Acute kidney failure, unspecified: Secondary | ICD-10-CM | POA: Diagnosis not present

## 2020-11-23 DIAGNOSIS — R0602 Shortness of breath: Secondary | ICD-10-CM

## 2020-11-23 DIAGNOSIS — E875 Hyperkalemia: Secondary | ICD-10-CM | POA: Diagnosis present

## 2020-11-23 DIAGNOSIS — I701 Atherosclerosis of renal artery: Secondary | ICD-10-CM | POA: Diagnosis present

## 2020-11-23 DIAGNOSIS — N184 Chronic kidney disease, stage 4 (severe): Secondary | ICD-10-CM | POA: Diagnosis present

## 2020-11-23 DIAGNOSIS — E785 Hyperlipidemia, unspecified: Secondary | ICD-10-CM | POA: Diagnosis present

## 2020-11-23 DIAGNOSIS — U071 COVID-19: Principal | ICD-10-CM | POA: Diagnosis present

## 2020-11-23 DIAGNOSIS — Z86010 Personal history of colonic polyps: Secondary | ICD-10-CM

## 2020-11-23 DIAGNOSIS — Z794 Long term (current) use of insulin: Secondary | ICD-10-CM

## 2020-11-23 DIAGNOSIS — I129 Hypertensive chronic kidney disease with stage 1 through stage 4 chronic kidney disease, or unspecified chronic kidney disease: Secondary | ICD-10-CM | POA: Diagnosis present

## 2020-11-23 DIAGNOSIS — Z833 Family history of diabetes mellitus: Secondary | ICD-10-CM

## 2020-11-23 DIAGNOSIS — Z96651 Presence of right artificial knee joint: Secondary | ICD-10-CM | POA: Diagnosis present

## 2020-11-23 DIAGNOSIS — Z7982 Long term (current) use of aspirin: Secondary | ICD-10-CM

## 2020-11-23 DIAGNOSIS — Z87891 Personal history of nicotine dependence: Secondary | ICD-10-CM

## 2020-11-23 DIAGNOSIS — Z8249 Family history of ischemic heart disease and other diseases of the circulatory system: Secondary | ICD-10-CM

## 2020-11-23 DIAGNOSIS — G4733 Obstructive sleep apnea (adult) (pediatric): Secondary | ICD-10-CM | POA: Diagnosis present

## 2020-11-23 DIAGNOSIS — Z79899 Other long term (current) drug therapy: Secondary | ICD-10-CM

## 2020-11-23 LAB — LACTIC ACID, PLASMA
Lactic Acid, Venous: 1.7 mmol/L (ref 0.5–1.9)
Lactic Acid, Venous: 1.5 mmol/L (ref 0.5–1.9)

## 2020-11-23 LAB — TRIGLYCERIDES: Triglycerides: 183 mg/dL — ABNORMAL HIGH (ref <150)

## 2020-11-23 LAB — CBG MONITORING, ED: Glucose-Capillary: 134 mg/dL — ABNORMAL HIGH (ref 70–99)

## 2020-11-23 LAB — D-DIMER, QUANTITATIVE: D-Dimer, Quant: 2.14 ug/mL-FEU — ABNORMAL HIGH (ref 0.00–0.50)

## 2020-11-23 LAB — FIBRINOGEN: Fibrinogen: 757 mg/dL — ABNORMAL HIGH (ref 210–475)

## 2020-11-23 LAB — BASIC METABOLIC PANEL WITH GFR
Anion gap: 13 (ref 5–15)
BUN: 90 mg/dL — ABNORMAL HIGH (ref 8–23)
CO2: 16 mmol/L — ABNORMAL LOW (ref 22–32)
Calcium: 9.2 mg/dL (ref 8.9–10.3)
Chloride: 110 mmol/L (ref 98–111)
Creatinine, Ser: 3.82 mg/dL — ABNORMAL HIGH (ref 0.44–1.00)
GFR, Estimated: 13 mL/min — ABNORMAL LOW
Glucose, Bld: 179 mg/dL — ABNORMAL HIGH (ref 70–99)
Potassium: 5.4 mmol/L — ABNORMAL HIGH (ref 3.5–5.1)
Sodium: 139 mmol/L (ref 135–145)

## 2020-11-23 LAB — CBC
HCT: 47.1 % — ABNORMAL HIGH (ref 36.0–46.0)
Hemoglobin: 14.6 g/dL (ref 12.0–15.0)
MCH: 27 pg (ref 26.0–34.0)
MCHC: 31 g/dL (ref 30.0–36.0)
MCV: 87.1 fL (ref 80.0–100.0)
Platelets: 230 10*3/uL (ref 150–400)
RBC: 5.41 MIL/uL — ABNORMAL HIGH (ref 3.87–5.11)
RDW: 14.9 % (ref 11.5–15.5)
WBC: 7.5 10*3/uL (ref 4.0–10.5)
nRBC: 0.7 % — ABNORMAL HIGH (ref 0.0–0.2)

## 2020-11-23 LAB — LACTATE DEHYDROGENASE: LDH: 346 U/L — ABNORMAL HIGH (ref 98–192)

## 2020-11-23 LAB — PROCALCITONIN: Procalcitonin: 1.32 ng/mL

## 2020-11-23 LAB — TROPONIN I (HIGH SENSITIVITY): Troponin I (High Sensitivity): 42 ng/L — ABNORMAL HIGH (ref <18)

## 2020-11-23 LAB — C-REACTIVE PROTEIN: CRP: 15.2 mg/dL — ABNORMAL HIGH (ref <1.0)

## 2020-11-23 LAB — POC SARS CORONAVIRUS 2 AG -  ED: SARS Coronavirus 2 Ag: POSITIVE — AB

## 2020-11-23 LAB — FERRITIN: Ferritin: 740 ng/mL — ABNORMAL HIGH (ref 11–307)

## 2020-11-23 LAB — BRAIN NATRIURETIC PEPTIDE: B Natriuretic Peptide: 20.7 pg/mL (ref 0.0–100.0)

## 2020-11-23 MED ORDER — GUAIFENESIN-DM 100-10 MG/5ML PO SYRP: 10.0000 mL | ORAL_SOLUTION | ORAL | Status: DC | PRN

## 2020-11-23 MED ORDER — HEPARIN SODIUM (PORCINE) 5000 UNIT/ML IJ SOLN
5000.0000 [IU] | Freq: Three times a day (TID) | INTRAMUSCULAR | Status: DC
  Administered 2020-11-23 – 2020-11-24 (×2): 5000 [IU] via SUBCUTANEOUS
  Filled 2020-11-23 (×2): qty 1

## 2020-11-23 MED ORDER — SODIUM CHLORIDE 0.9 % IV SOLN
200.0000 mg | Freq: Once | INTRAVENOUS | Status: DC
  Filled 2020-11-23: qty 40

## 2020-11-23 MED ORDER — SODIUM CHLORIDE 0.9 % IV BOLUS
1000.0000 mL | Freq: Once | INTRAVENOUS | Status: AC
  Administered 2020-11-23: 1000 mL via INTRAVENOUS

## 2020-11-23 MED ORDER — SODIUM CHLORIDE 0.9 % IV SOLN: 100.0000 mg | Freq: Every day | INTRAVENOUS | Status: DC

## 2020-11-23 MED ORDER — SENNOSIDES-DOCUSATE SODIUM 8.6-50 MG PO TABS: 1.0000 | ORAL_TABLET | Freq: Every evening | ORAL | Status: DC | PRN

## 2020-11-23 MED ORDER — INSULIN ASPART 100 UNIT/ML ~~LOC~~ SOLN
0.0000 [IU] | Freq: Three times a day (TID) | SUBCUTANEOUS | Status: DC
  Administered 2020-11-25: 5 [IU] via SUBCUTANEOUS
  Administered 2020-11-25: 2 [IU] via SUBCUTANEOUS
  Administered 2020-11-26: 3 [IU] via SUBCUTANEOUS

## 2020-11-23 MED ORDER — SODIUM ZIRCONIUM CYCLOSILICATE 5 G PO PACK
5.0000 g | PACK | Freq: Once | ORAL | Status: AC
  Administered 2020-11-23: 5 g via ORAL
  Filled 2020-11-23: qty 1

## 2020-11-23 MED ORDER — INSULIN ASPART 100 UNIT/ML ~~LOC~~ SOLN: 0.0000 [IU] | Freq: Every day | SUBCUTANEOUS | Status: DC

## 2020-11-23 MED ORDER — SODIUM CHLORIDE 0.9 % IV SOLN: INTRAVENOUS | Status: AC

## 2020-11-23 MED ORDER — HYDROCOD POLST-CPM POLST ER 10-8 MG/5ML PO SUER: 5.0000 mL | Freq: Two times a day (BID) | ORAL | Status: DC | PRN

## 2020-11-23 MED ORDER — ACETAMINOPHEN 325 MG PO TABS
650.0000 mg | ORAL_TABLET | Freq: Once | ORAL | Status: AC | PRN
  Administered 2020-11-23: 650 mg via ORAL
  Filled 2020-11-23: qty 2

## 2020-11-23 MED ORDER — INSULIN GLARGINE 100 UNIT/ML ~~LOC~~ SOLN
35.0000 [IU] | Freq: Every day | SUBCUTANEOUS | Status: DC
  Administered 2020-11-24: 17 [IU] via SUBCUTANEOUS
  Administered 2020-11-25: 35 [IU] via SUBCUTANEOUS
  Filled 2020-11-23 (×4): qty 0.35

## 2020-11-23 MED ORDER — SODIUM CHLORIDE 0.9 % IV BOLUS: 1000.0000 mL | Freq: Once | INTRAVENOUS | Status: DC

## 2020-11-23 MED ORDER — ASPIRIN EC 81 MG PO TBEC
81.0000 mg | DELAYED_RELEASE_TABLET | Freq: Every day | ORAL | Status: DC
  Administered 2020-11-23 – 2020-11-24 (×2): 81 mg via ORAL
  Filled 2020-11-23 (×2): qty 1

## 2020-11-23 MED ORDER — ACETAMINOPHEN 325 MG PO TABS
650.0000 mg | ORAL_TABLET | Freq: Four times a day (QID) | ORAL | Status: DC | PRN
  Administered 2020-11-24: 650 mg via ORAL
  Filled 2020-11-23: qty 2

## 2020-11-23 NOTE — H&P (Addendum)
Date: 11/23/2020               Patient Name:  Kristy [name] MRN: [id number]  DOB: 09-05-1956 Age / Sex: 65 y.o., female   PCP: Marianna Payment, MD         Medical Service: Internal Medicine Teaching Service         Attending Physician: Dr. Dareen Piano    First Contact: Dr. Bridgett Larsson Pager: [phone]  Second Contact: Dr. Sharon Seller  Pager: [phone]       After Hours (After 5p/  First Contact Pager: [phone]  weekends / holidays): Second Contact Pager: [phone]   Chief Complaint: generalized weakness  History of Present Illness:   Kristy [name] is a 65 y.o. female with hx of CKD 4, insulin dependent diabetes, renovascular HTN due to fibromuscular dysplasia, HLD, OSA presenting for generalized weakness. States that she hasn't had an appetite for two weeks. About a weak ago she started feeling weak and developing body aches. She's been trying to drink water but feels dehydrated and her mouth is very dry. She has been coughing a lot, has had a sore throat. Has pain with coughing. Nonproductive. No shortness of breath. No abdominal pain, diarrhea, dysuria, passing out.  She is not sure if she is taking hyzaar or spironolactone, but states she is taking what she is supposed to take. She does [place] xultolphy 40U qhs. Follows with Dr. Moshe Cipro with [facility] every 3-4 months, last seen in Fall 2021.   She has not had the covid vaccination.   ED Course: COVID positive. AKI with creatinine 3.82 from baseline has been progressing but seems to be 1.7-2.   Current Outpatient Medications  Medication Instructions  . Accu-Chek FastClix Lancets MISC Check blood sugar two times a day  . amLODipine-atorvastatin (CADUET) 5-40 MG tablet 1 tablet, Oral, Daily  . aspirin 81 mg, Oral, Daily,    . Blood Glucose Monitoring Suppl (ACCU-CHEK GUIDE) w/Device KIT 1 each, Does not apply, 2 times daily  . carvedilol (COREG) 25 mg, Oral, 2 times daily  . Cholecalciferol 1,000 Units, Oral, Daily  . diclofenac  sodium (VOLTAREN) 2 g, Topical, 4 times daily  . glucose blood (ACCU-CHEK GUIDE) test strip Check blood sugar two times a day  . ibuprofen (ADVIL) 600 mg, Oral, Every 8 hours PRN  . Insulin Degludec-Liraglutide (XULTOPHY) 100-3.6 UNIT-MG/ML SOPN 40 Units, Subcutaneous, Daily  . Insulin Pen Needle (B-D UF III MINI PEN NEEDLES) 31G X 5 MM MISC 1 Device, Does not apply, 3 times daily before meals, Check daily 3X before meals  . Insulin Syringe-Needle U-100 (INSULIN SYRINGE .5CC/31GX5/16") 31G X 5/16" 0.5 ML MISC 1 Dose, Subcutaneous, Daily, The patient is insulin requiring, ICD 10 code E11.42. Insulin 1 time daily  . losartan-hydrochlorothiazide (HYZAAR) 100-25 MG tablet 1 tablet, Oral, Daily  . spironolactone (ALDACTONE) 50 mg, Oral, Daily  . traMADol (ULTRAM) 50 MG tablet TAKE 2 TABLETS BY MOUTH EVERY 6 HOURS AS NEEDED FOR SEVERE PAIN    Allergies as of 11/23/2020  . (No Known Allergies)    Past Medical History:  Diagnosis Date  . Colon polyps   . Colon polyps   . Diabetes mellitus   . Diverticulosis   . Hyperlipidemia   . Hypertension   . Renal artery stenosis ([place]) 2007    status post selective bilateral renal angiography, balloon angiopathy of the left renal artery, first diagnosed in 2007 based on MRA of the renal arteries which suggested fibrovascular dysplasia on  the lab, done by Dr. Albertine Patricia  . Sleep apnea 2007    status post polysomnogram 2007 , suggested use of CPAP  . Ventricular hypertrophy 2006    a 2-D echo in 2006, ejection fraction 55%, mild tricuspid regurgitation noted as well, 2-D echo November 13, 5954 showed diastolic dysfunction with LVEF normal of 65%    Past Surgical History:  Procedure Laterality Date  . BREAST BIOPSY Right 2016  . RENAL ARTERY STENT  2007    patient is status post renal artery stent placement May 2007 and that was done secondary to renal artery stenosis, fibromuscular dysplasia  . REPLACEMENT TOTAL KNEE  02/2011-Dr. Ninfa Linden   S/P Right Total  Knee Replacement May/2012    Family History  Problem Relation Age of Onset  . Diabetes Mother   . Cancer Mother   . Diabetes Father   . Cancer Other        breast cancer died in her 26s  . Bipolar disorder Son   . Hypertension Daughter   . Stroke Daughter   . Alcohol abuse Son   . Drug abuse Son   . Breast cancer Cousin   . Hypertension Sister   . Hypertension Sister   . Hypertension Daughter   . Hypertension Daughter   . Hypertension Daughter     Social History   Tobacco Use  . Smoking status: Former Smoker    Packs/day: 1.00    Years: 25.00    Pack years: 25.00    Quit date: 10/26/2005    Years since quitting: 15.0  . Smokeless tobacco: Never Used  Substance Use Topics  . Alcohol use: No    Alcohol/week: 0.0 standard drinks  . Drug use: No   Lives in [place] with her family. Independent in ADLs.  Review of Systems: Review of Systems  Constitutional: Positive for malaise/fatigue. Negative for chills and fever.  HENT: Positive for sore throat. Negative for congestion.   Eyes: Negative for blurred vision and double vision.  Respiratory: Positive for cough. Negative for sputum production and shortness of breath.   Cardiovascular: Positive for chest pain (with coughing only). Negative for palpitations.  Gastrointestinal: Negative for abdominal pain, constipation, diarrhea, nausea and vomiting.  Genitourinary: Negative for dysuria and frequency.  Musculoskeletal: Positive for myalgias.  Skin: Negative for itching and rash.  Neurological: Positive for dizziness. Negative for loss of consciousness.     Physical Exam: Blood pressure 105/75, pulse (!) 102, temperature 99.7 F (37.6 C), temperature source Oral, resp. rate (!) 23, last menstrual period 01/05/2009, SpO2 95 %. Physical Exam Vitals and nursing note reviewed.  Constitutional:      General: She is not in acute distress.    Appearance: Normal appearance.  HENT:     Head: Normocephalic and atraumatic.      Right Ear: External ear normal.     Left Ear: External ear normal.     Nose: Nose normal.     Mouth/Throat:     Mouth: Mucous membranes are dry.     Comments: Furrows on tongue Eyes:     Extraocular Movements: Extraocular movements intact.     Comments: Conjunctiva injected bilaterally  Cardiovascular:     Rate and Rhythm: Normal rate and regular rhythm.     Pulses: Normal pulses.     Heart sounds: Normal heart sounds.  Pulmonary:     Effort: Pulmonary effort is normal.     Breath sounds: Normal breath sounds. No wheezing, rhonchi or rales.  Abdominal:  General: Abdomen is flat. Bowel sounds are normal.     Palpations: Abdomen is soft.     Tenderness: There is no right CVA tenderness or left CVA tenderness.     Comments: Minimal epigastric tenderness  Musculoskeletal:        General: Normal range of motion.     Cervical back: Normal range of motion and neck supple.     Right lower leg: No edema.     Left lower leg: No edema.     Comments: Tenderness to back of bilateral calves but no skin changes, no swelling  Skin:    General: Skin is warm and dry.  Neurological:     General: No focal deficit present.     Mental Status: She is alert and oriented to person, place, and time.  Psychiatric:        Mood and Affect: Mood normal.        Behavior: Behavior normal.      EKG: personally reviewed my interpretation is normal sinus rhythm, nonspecific mild ST changes possibly J point elevations, somewhat high T waves  CXR: personally reviewed my interpretation is mild interstitial opacities bilaterally consistent with Covid pneumonia   Assessment & Plan by Problem: Active Problems:   AKI (acute kidney injury) ([place])   Kristy [name] is a 65 y.o. female with hx of CKD 4, insulin dependent diabetes, renovascular HTN due to fibromuscular dysplasia, HLD, OSA presenting for generalized weakness, found to have COVID-19.  COVID-19 pneumonia Not vaccinated. CXR with pneumonia  but no oxygen requirement. Reports non-productive cough. No SOB. Main symptoms are loss of taste, poor appetite, myalgias, and generalized weakness. -Hold off on COVID specific medications for now -Monitor pulse oxygenation -Robitussin as needed -Follow-up inflammatory markers -Follow-up lactic acid  AKI on CKD 4 Hyperkalemia Creatinine of 3.82 on admission. Baseline has been progressing recently but seems to be around 1.7-2.0.  Most likely prerenal with history of poor p.o. intake for 2 weeks.  Potassium of 5.4 on admission, no chest pain or palpitations.  EKG with borderline high T waves. -Lokelma 5 g -Normal saline 1 L bolus, followed by maintenance fluids -Avoid nephrotoxins -Follow-up urinalysis -Strict intake and output -Daily BMP  DM type II with peripheral neuropathy Last A1c of 7.4 on 10/14/20. Home meds of Insulin Degludec-Liraglutide (Xutolphy) 40 units daily.  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 Unclear if she is taking hyzaar or spironolactone, -Hold for now as blood pressure is soft  HLD Home med of atorvastatin 40mg  daily and ASA 81mg . -Resume home aspirin  OSA Does not use CPAP at home because the mask does not fit, has been referred to sleep medicine for mask fitting. -CPAP nightly   Dispo: Admit patient to Inpatient with expected length of stay greater than 2 midnights.  Signed: Andrew Au, MD 11/23/2020, 5:44 PM  Pager: [phone]  After 5pm on weekdays and 1pm on weekends: On Call pager: [phone]

## 2020-11-23 NOTE — ED Notes (Signed)
Labs were drawn late  So the times do not correlate

## 2020-11-23 NOTE — ED Notes (Signed)
Meal tray given 

## 2020-11-23 NOTE — ED Notes (Signed)
Daughter Tilda Burrow would like updates on patient. Number in chart.

## 2020-11-23 NOTE — ED Triage Notes (Signed)
Patient arrived by [facility] with body aches x 10 days. Also reports that her body aches are worse with ambulation. Patient alert and oriented, has not been vaccinated. NAD

## 2020-11-23 NOTE — ED Provider Notes (Signed)
[facility] DEPARTMENT Provider Note   CSN: [id number] Arrival date & time: 11/23/20  1300     History No chief complaint on file.   Kristy [name] is a 65 y.o. female.  HPI Patient presents with 2 weeks of illness. She acknowledges multiple medical issues, including hypertension, chronic kidney disease, but states that she was doing generally well until this illness began.  Now, over the past 2 weeks patient has had progressive weakness, anorexia, nausea.  She notes associated weight loss, but cannot quantify the amount. No clear alleviating or exacerbating factors and no relief with anything. She denies chest pain, denies abdominal pain, denies change in dyspnea, which is mild at baseline. With progression of disease she presents for evaluation.    Past Medical History:  Diagnosis Date  . Colon polyps   . Colon polyps   . Diabetes mellitus   . Diverticulosis   . Hyperlipidemia   . Hypertension   . Renal artery stenosis ([place]) 2007    status post selective bilateral renal angiography, balloon angiopathy of the left renal artery, first diagnosed in 2007 based on MRA of the renal arteries which 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 Albertine Patricia  . Sleep apnea 2007    status post polysomnogram 2007 , suggested use of CPAP  . Ventricular hypertrophy 2006    a 2-D echo in 2006, ejection fraction 55%, mild tricuspid regurgitation noted as well, 2-D echo November 13, 4006 showed diastolic dysfunction with LVEF normal of 65%    Patient Active Problem List   Diagnosis Date Noted  . Lower extremity edema 06/08/2019  . Osteoarthritis of both knees 07/29/2018  . Obstructive sleep apnea 10/07/2017  . Claudication ([place]) 10/07/2017  . CKD (chronic kidney disease) stage 4, GFR 15-29 ml/min (HCC) 06/04/2017  . Hypercalcemia 06/26/2016  . Vitamin D deficiency 12/07/2014  . Moderate major depression ([place]) 08/09/2014  . Chronic, continuous use of  opioids 03/14/2013  . 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 DM type 2 with diabetic peripheral neuropathy ([place]) 12/06/2012  . Dyslipidemia 07/21/2006    Past Surgical History:  Procedure Laterality Date  . BREAST BIOPSY Right 2016  . RENAL ARTERY STENT  2007    patient is status post renal artery stent placement May 2007 and that was done secondary to renal artery stenosis, fibromuscular dysplasia  . REPLACEMENT TOTAL KNEE  02/2011-Dr. Ninfa Linden   S/P Right Total Knee Replacement May/2012     OB History   No obstetric history on file.     Family History  Problem Relation Age of Onset  . Diabetes Mother   . Cancer Mother   . Diabetes Father   . Cancer Other        breast cancer died in her 43s  . Bipolar disorder Son   . Hypertension Daughter   . Stroke Daughter   . Alcohol abuse Son   . Drug abuse Son   . Breast cancer Cousin   . Hypertension Sister   . Hypertension Sister   . Hypertension Daughter   . Hypertension Daughter   . Hypertension Daughter     Social History   Tobacco Use  . Smoking status: Former Smoker    Packs/day: 0.33    Years: 25.00    Pack years: 8.25    Quit date: 10/26/2005    Years since quitting: 15.0  . Smokeless tobacco: Never Used  Substance Use Topics  . Alcohol use:  No    Alcohol/week: 0.0 standard drinks  . Drug use: No    Home Medications Prior to Admission medications   Medication Sig Start Date End Date Taking? Authorizing Provider  Accu-Chek FastClix Lancets MISC Check blood sugar two times a day 10/05/19   Ina Homes, MD  amLODipine-atorvastatin (CADUET) 5-40 MG tablet Take 1 tablet by mouth daily. 07/03/20   Madalyn Rob, MD  aspirin 81 MG EC tablet Take 81 mg by mouth daily.    [provider]  Blood Glucose Monitoring Suppl (ACCU-CHEK GUIDE) w/Device KIT 1 each by Does not apply route 2 (two) times daily. 02/04/17   Mignon Pine, DO  carvedilol (COREG) 25 MG tablet  Take 1 tablet (25 mg total) by mouth 2 (two) times daily. 10/28/19   Marianna Payment, MD  Cholecalciferol 1000 units capsule Take 1 capsule (1,000 Units total) by mouth daily. Patient not taking: Reported on 11/14/2020 08/29/18   Isabelle Course, MD  diclofenac sodium (VOLTAREN) 1 % GEL Apply 2 g topically 4 (four) times daily. Patient not taking: Reported on 11/14/2020 07/28/18   Isabelle Course, MD  glucose blood (ACCU-CHEK GUIDE) test strip Check blood sugar two times a day 10/05/19   Ina Homes, MD  ibuprofen (ADVIL) 600 MG tablet Take 1 tablet (600 mg total) by mouth every 8 (eight) hours as needed for moderate pain. 09/25/19   Maudie Mercury, MD  Insulin Degludec-Liraglutide (XULTOPHY) 100-3.6 UNIT-MG/ML SOPN Inject 40 Units into the skin daily. 12/04/19   Forde Dandy, PharmD  Insulin Pen Needle (B-D UF III MINI PEN NEEDLES) 31G X 5 MM MISC 1 Device by Does not apply route 3 (three) times daily before meals. Check daily 3X before meals 08/17/13   McLean-Scocuzza, Nino Glow, MD  Insulin Syringe-Needle U-100 (INSULIN SYRINGE .5CC/31GX5/16") 31G X 5/16" 0.5 ML MISC Inject 1 Dose into the skin daily. The patient is insulin requiring, ICD 10 code E11.42. Insulin 1 time daily 02/04/17   Mignon Pine, DO  losartan-hydrochlorothiazide (HYZAAR) 100-25 MG tablet Take 1 tablet by mouth daily. 07/04/20   Cato Mulligan, MD  spironolactone (ALDACTONE) 50 MG tablet Take 1 tablet (50 mg total) by mouth daily. 10/28/19 10/27/20  Marianna Payment, MD  traMADol (ULTRAM) 50 MG tablet TAKE 2 TABLETS BY MOUTH EVERY 6 HOURS AS NEEDED FOR SEVERE PAIN 11/04/20   Marianna Payment, MD    Allergies    Patient has no known allergies.  Review of Systems   Review of Systems  Constitutional:       Per HPI, otherwise negative  HENT:       Per HPI, otherwise negative  Respiratory:       Per HPI, otherwise negative  Cardiovascular:       Per HPI, otherwise negative  Gastrointestinal: Positive for nausea. Negative for abdominal  pain and vomiting.  Endocrine:       Negative aside from HPI  Genitourinary:       Neg aside from HPI   Musculoskeletal:       Per HPI, otherwise negative  Skin: Negative.   Neurological: Positive for weakness. Negative for syncope.    Physical Exam Updated Vital Signs BP 105/75   Pulse (!) 102   Temp 99.7 F (37.6 C) (Oral)   Resp (!) 23   LMP 01/05/2009   SpO2 95%   Physical Exam Vitals and nursing note reviewed.  Constitutional:      General: She is not in acute distress.  Appearance: She is well-developed and well-nourished. She is obese.  HENT:     Head: Normocephalic and atraumatic.  Eyes:     Extraocular Movements: EOM normal.     Conjunctiva/sclera: Conjunctivae normal.  Cardiovascular:     Rate and Rhythm: Normal rate and regular rhythm.  Pulmonary:     Effort: Pulmonary effort is normal. No respiratory distress.     Breath sounds: Normal breath sounds. No stridor.  Abdominal:     General: There is no distension.     Tenderness: There is no abdominal tenderness. There is no guarding.  Musculoskeletal:        General: No edema.  Skin:    General: Skin is warm and dry.  Neurological:     Mental Status: She is alert and oriented to person, place, and time.     Cranial Nerves: No cranial nerve deficit.  Psychiatric:        Mood and Affect: Mood and affect normal.     ED Results / Procedures / Treatments   Labs (all labs ordered are listed, but only abnormal results are displayed) Labs Reviewed  CBC - Abnormal; Notable for the following components:      Result Value   RBC 5.41 (*)    HCT 47.1 (*)    nRBC 0.7 (*)    All other components within normal limits  BASIC METABOLIC PANEL - Abnormal; Notable for the following components:   Potassium 5.4 (*)    CO2 16 (*)    Glucose, Bld 179 (*)    BUN 90 (*)    Creatinine, Ser 3.82 (*)    GFR, Estimated 13 (*)    All other components within normal limits  POC SARS CORONAVIRUS 2 AG -  ED - Abnormal;  Notable for the following components:   SARS Coronavirus 2 Ag POSITIVE (*)    All other components within normal limits  CULTURE, BLOOD (ROUTINE X 2)  CULTURE, BLOOD (ROUTINE X 2)  LACTIC ACID, PLASMA  LACTIC ACID, PLASMA  D-DIMER, QUANTITATIVE (NOT AT [facility])  PROCALCITONIN  LACTATE DEHYDROGENASE  FERRITIN  TRIGLYCERIDES  FIBRINOGEN  C-REACTIVE PROTEIN  BRAIN NATRIURETIC PEPTIDE    EKG None  Radiology DG Chest Portable 1 View  Result Date: 11/23/2020 CLINICAL DATA:  Cough and fever with body aches. EXAM: PORTABLE CHEST 1 VIEW COMPARISON:  02/27/2011 FINDINGS: Hazy peripheral indistinct airspace opacities are present in both lungs, suspicious for atypical pneumonia. This pattern can be seen in the setting of COVID pneumonia, correlate with recent testing. Mild enlargement of the cardiopericardial silhouette noted. No blunting of the costophrenic angles. Thoracic spondylosis. IMPRESSION: 1. Hazy peripheral airspace opacities in both lungs, suspicious for atypical pneumonia. This pattern can be seen in COVID pneumonia, correlate with any recent testing. 2. Mild but stable enlargement of the cardiopericardial silhouette. Electronically Signed   By: Gaylyn Rong M.D.   On: 11/23/2020 13:34    Procedures Procedures   Medications Ordered in ED Medications  sodium chloride 0.9 % bolus 1,000 mL (has no administration in time range)  acetaminophen (TYLENOL) tablet 650 mg (650 mg Oral Given 11/23/20 1314)    ED Course  I have reviewed the triage vital signs and the nursing notes.  Pertinent labs & imaging results that were available during my care of the patient were reviewed by me and considered in my medical decision making (see chart for details).  Obese adult female with multiple medical issues, not vaccinated against Covid presents with weakness, anorexia, fatigue,  is found to have Covid positive result.  Complicating the patient's illness is her history of CKD, and evidence for  acute kidney injury today, with a creatinine that is increased from 2 now 3.8 over a 10-day span. X-ray consistent with bilateral Covid pneumonia, and given her comorbidities patient was started on remdesivir, I discussed her case with our internal medicine colleagues and she was admitted for further monitoring and management Final Clinical Impression(s) / ED Diagnoses Final diagnoses:  Pneumonia due to COVID-19 virus  AKI (acute kidney injury) ([place])     Carmin Muskrat, MD 11/23/20 1645

## 2020-11-23 NOTE — ED Notes (Signed)
The pt reports that she has not been well for approx 2 weeks  Feet and legs hurting no sob she denies a temp  A and o x 4

## 2020-11-24 ENCOUNTER — Observation Stay (HOSPITAL_COMMUNITY): Payer: Medicare Other

## 2020-11-24 DIAGNOSIS — Z86010 Personal history of colonic polyps: Secondary | ICD-10-CM | POA: Diagnosis not present

## 2020-11-24 DIAGNOSIS — E1122 Type 2 diabetes mellitus with diabetic chronic kidney disease: Secondary | ICD-10-CM | POA: Diagnosis present

## 2020-11-24 DIAGNOSIS — Z96651 Presence of right artificial knee joint: Secondary | ICD-10-CM | POA: Diagnosis present

## 2020-11-24 DIAGNOSIS — E875 Hyperkalemia: Secondary | ICD-10-CM | POA: Diagnosis present

## 2020-11-24 DIAGNOSIS — U071 COVID-19: Secondary | ICD-10-CM | POA: Diagnosis present

## 2020-11-24 DIAGNOSIS — Z79899 Other long term (current) drug therapy: Secondary | ICD-10-CM | POA: Diagnosis not present

## 2020-11-24 DIAGNOSIS — Z8249 Family history of ischemic heart disease and other diseases of the circulatory system: Secondary | ICD-10-CM | POA: Diagnosis not present

## 2020-11-24 DIAGNOSIS — I129 Hypertensive chronic kidney disease with stage 1 through stage 4 chronic kidney disease, or unspecified chronic kidney disease: Secondary | ICD-10-CM | POA: Diagnosis present

## 2020-11-24 DIAGNOSIS — E785 Hyperlipidemia, unspecified: Secondary | ICD-10-CM | POA: Diagnosis present

## 2020-11-24 DIAGNOSIS — I701 Atherosclerosis of renal artery: Secondary | ICD-10-CM | POA: Diagnosis present

## 2020-11-24 DIAGNOSIS — J1282 Pneumonia due to coronavirus disease 2019: Secondary | ICD-10-CM

## 2020-11-24 DIAGNOSIS — Z87891 Personal history of nicotine dependence: Secondary | ICD-10-CM | POA: Diagnosis not present

## 2020-11-24 DIAGNOSIS — E1142 Type 2 diabetes mellitus with diabetic polyneuropathy: Secondary | ICD-10-CM | POA: Diagnosis present

## 2020-11-24 DIAGNOSIS — Z7982 Long term (current) use of aspirin: Secondary | ICD-10-CM | POA: Diagnosis not present

## 2020-11-24 DIAGNOSIS — Z794 Long term (current) use of insulin: Secondary | ICD-10-CM | POA: Diagnosis not present

## 2020-11-24 DIAGNOSIS — Z833 Family history of diabetes mellitus: Secondary | ICD-10-CM | POA: Diagnosis not present

## 2020-11-24 DIAGNOSIS — G4733 Obstructive sleep apnea (adult) (pediatric): Secondary | ICD-10-CM | POA: Diagnosis not present

## 2020-11-24 DIAGNOSIS — R7989 Other specified abnormal findings of blood chemistry: Secondary | ICD-10-CM | POA: Diagnosis not present

## 2020-11-24 DIAGNOSIS — N184 Chronic kidney disease, stage 4 (severe): Secondary | ICD-10-CM | POA: Diagnosis present

## 2020-11-24 DIAGNOSIS — N179 Acute kidney failure, unspecified: Secondary | ICD-10-CM | POA: Diagnosis present

## 2020-11-24 HISTORY — DX: Pneumonia due to coronavirus disease 2019: J12.82

## 2020-11-24 HISTORY — DX: COVID-19: U07.1

## 2020-11-24 LAB — COMPREHENSIVE METABOLIC PANEL
ALT: 33 U/L (ref 0–44)
AST: 50 U/L — ABNORMAL HIGH (ref 15–41)
Albumin: 2.5 g/dL — ABNORMAL LOW (ref 3.5–5.0)
Alkaline Phosphatase: 59 U/L (ref 38–126)
Anion gap: 12 (ref 5–15)
BUN: 75 mg/dL — ABNORMAL HIGH (ref 8–23)
CO2: 16 mmol/L — ABNORMAL LOW (ref 22–32)
Calcium: 8.7 mg/dL — ABNORMAL LOW (ref 8.9–10.3)
Chloride: 113 mmol/L — ABNORMAL HIGH (ref 98–111)
Creatinine, Ser: 2.82 mg/dL — ABNORMAL HIGH (ref 0.44–1.00)
GFR, Estimated: 18 mL/min — ABNORMAL LOW (ref >60)
Glucose, Bld: 98 mg/dL (ref 70–99)
Potassium: 5.2 mmol/L — ABNORMAL HIGH (ref 3.5–5.1)
Sodium: 141 mmol/L (ref 135–145)
Total Bilirubin: 1.1 mg/dL (ref 0.3–1.2)
Total Protein: 6.2 g/dL — ABNORMAL LOW (ref 6.5–8.1)

## 2020-11-24 LAB — URINALYSIS, ROUTINE W REFLEX MICROSCOPIC
Bilirubin Urine: NEGATIVE
Glucose, UA: NEGATIVE mg/dL
Ketones, ur: NEGATIVE mg/dL
Nitrite: POSITIVE — AB
Protein, ur: 100 mg/dL — AB
RBC / HPF: >50 RBC/hpf — ABNORMAL HIGH (ref 0–5)
Specific Gravity, Urine: 1.017 (ref 1.005–1.030)
pH: 6 (ref 5.0–8.0)

## 2020-11-24 LAB — CBC WITH DIFFERENTIAL/PLATELET
Abs Immature Granulocytes: 0.07 10*3/uL (ref 0.00–0.07)
Basophils Absolute: 0 10*3/uL (ref 0.0–0.1)
Basophils Relative: 0 %
Eosinophils Absolute: 0 10*3/uL (ref 0.0–0.5)
Eosinophils Relative: 0 %
HCT: 41.7 % (ref 36.0–46.0)
Hemoglobin: 13.5 g/dL (ref 12.0–15.0)
Immature Granulocytes: 1 %
Lymphocytes Relative: 14 %
Lymphs Abs: 1 10*3/uL (ref 0.7–4.0)
MCH: 28.2 pg (ref 26.0–34.0)
MCHC: 32.4 g/dL (ref 30.0–36.0)
MCV: 87.2 fL (ref 80.0–100.0)
Monocytes Absolute: 0.5 10*3/uL (ref 0.1–1.0)
Monocytes Relative: 7 %
Neutro Abs: 5.3 10*3/uL (ref 1.7–7.7)
Neutrophils Relative %: 78 %
Platelets: 223 10*3/uL (ref 150–400)
RBC: 4.78 MIL/uL (ref 3.87–5.11)
RDW: 15.1 % (ref 11.5–15.5)
WBC: 6.8 10*3/uL (ref 4.0–10.5)
nRBC: 0.4 % — ABNORMAL HIGH (ref 0.0–0.2)

## 2020-11-24 LAB — FERRITIN: Ferritin: 765 ng/mL — ABNORMAL HIGH (ref 11–307)

## 2020-11-24 LAB — TROPONIN I (HIGH SENSITIVITY): Troponin I (High Sensitivity): 51 ng/L — ABNORMAL HIGH (ref <18)

## 2020-11-24 LAB — CBG MONITORING, ED
Glucose-Capillary: 106 mg/dL — ABNORMAL HIGH (ref 70–99)
Glucose-Capillary: 88 mg/dL (ref 70–99)

## 2020-11-24 LAB — MAGNESIUM: Magnesium: 2.4 mg/dL (ref 1.7–2.4)

## 2020-11-24 LAB — HIV ANTIBODY (ROUTINE TESTING W REFLEX): HIV Screen 4th Generation wRfx: NONREACTIVE

## 2020-11-24 LAB — GLUCOSE, CAPILLARY
Glucose-Capillary: 82 mg/dL (ref 70–99)
Glucose-Capillary: 77 mg/dL (ref 70–99)

## 2020-11-24 LAB — PHOSPHORUS: Phosphorus: 2.3 mg/dL — ABNORMAL LOW (ref 2.5–4.6)

## 2020-11-24 LAB — C-REACTIVE PROTEIN: CRP: 12.5 mg/dL — ABNORMAL HIGH (ref <1.0)

## 2020-11-24 LAB — D-DIMER, QUANTITATIVE: D-Dimer, Quant: 3.07 ug/mL-FEU — ABNORMAL HIGH (ref 0.00–0.50)

## 2020-11-24 LAB — HEPARIN LEVEL (UNFRACTIONATED): Heparin Unfractionated: 1.62 IU/mL — ABNORMAL HIGH (ref 0.30–0.70)

## 2020-11-24 MED ORDER — HEPARIN BOLUS VIA INFUSION
4000.0000 [IU] | Freq: Once | INTRAVENOUS | Status: AC
  Administered 2020-11-24: 4000 [IU] via INTRAVENOUS
  Filled 2020-11-24: qty 4000

## 2020-11-24 MED ORDER — SODIUM CHLORIDE 0.9 % IV SOLN: INTRAVENOUS | Status: AC

## 2020-11-24 MED ORDER — SODIUM CHLORIDE 0.9 % IV SOLN: INTRAVENOUS | Status: DC

## 2020-11-24 MED ORDER — HEPARIN SODIUM (PORCINE) 5000 UNIT/ML IJ SOLN: 5000.0000 [IU] | Freq: Three times a day (TID) | INTRAMUSCULAR | Status: DC

## 2020-11-24 MED ORDER — SODIUM ZIRCONIUM CYCLOSILICATE 5 G PO PACK
5.0000 g | PACK | Freq: Once | ORAL | Status: AC
  Administered 2020-11-24: 5 g via ORAL
  Filled 2020-11-24: qty 1

## 2020-11-24 MED ORDER — HEPARIN (PORCINE) 25000 UT/250ML-% IV SOLN
1000.0000 [IU]/h | INTRAVENOUS | Status: DC
  Administered 2020-11-24: 1000 [IU]/h via INTRAVENOUS
  Filled 2020-11-24: qty 250

## 2020-11-24 MED ORDER — HEPARIN (PORCINE) 25000 UT/250ML-% IV SOLN
1250.0000 [IU]/h | INTRAVENOUS | Status: DC
  Administered 2020-11-24 (×2): 1250 [IU]/h via INTRAVENOUS
  Filled 2020-11-24 (×2): qty 250

## 2020-11-24 NOTE — Progress Notes (Signed)
VASCULAR LAB    Bilateral lower extremity venous duplex has been performed.  See CV proc for preliminary results.   Jermesha Sottile, RVT 11/24/2020, 12:46 PM

## 2020-11-24 NOTE — ED Notes (Signed)
Pt voided in her diaper we have not been able to gather a urine  From the diaper and on the bedpan poop had contamionated the  Urine specimen

## 2020-11-24 NOTE — Progress Notes (Signed)
Kristy [name]  Code Status: FULL  Kristy [name] is a 65 y.o. female patient admitted from ED awake, alert - oriented X4 - no acute distress noted. VSS -  no c/o shortness of breath, no c/o chest pain. Cardiac tele in place. Fall assessment complete, with patient able to verbalize understanding of risk associated with falls, and verbalized understanding to call nursing before up out of bed. Call light within reach, patient able to voice, and demonstrate understanding. Skin, clean-dry- intact without evidence of bruising, or skin tears.  No evidence of skin break down noted on exam.  ?  Will cont to eval and treat per MD orders.  Melonie Florida, RN  11/24/2020

## 2020-11-24 NOTE — Progress Notes (Signed)
[facility] for Heparin Indication: pulmonary embolus rule out  No Known Allergies  Patient Measurements: Height: 5\' 3"  (160 cm) Weight: 97 kg (213 lb 13.5 oz) IBW/kg (Calculated) : 52.4 Heparin Dosing Weight: 75 kg  Vital Signs: Temp: 99.5 F (37.5 C) (01/30 1107) Temp Source: Oral (01/30 1107) BP: 123/88 (01/30 1107) Pulse Rate: 109 (01/30 1107)  Labs: Recent Labs    11/23/20 1319 11/23/20 2007 11/24/20 0951  HGB 14.6  --  13.5  HCT 47.1*  --  41.7  PLT 230  --  223  CREATININE 3.82*  --  2.82*  TROPONINIHS  --  42* 51*    Estimated Creatinine Clearance: 22.3 mL/min (A) (by C-G formula based on SCr of 2.82 mg/dL (H)).   Medical History: Past Medical History:  Diagnosis Date  . Colon polyps   . Colon polyps   . Diabetes mellitus   . Diverticulosis   . Hyperlipidemia   . Hypertension   . Renal artery stenosis ([place]) 2007    status post selective bilateral renal angiography, balloon angiopathy of the left renal artery, first diagnosed in 2007 based on MRA of the renal arteries which 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 Albertine Patricia  . Sleep apnea 2007    status post polysomnogram 2007 , suggested use of CPAP  . Ventricular hypertrophy 2006    a 2-D echo in 2006, ejection fraction 55%, mild tricuspid regurgitation noted as well, 2-D echo November 14, 6043 showed diastolic dysfunction with LVEF normal of 65%    Medications:  Scheduled:  . aspirin EC  81 mg Oral Daily  . heparin  4,000 Units Intravenous Once  . insulin aspart  0-15 Units Subcutaneous TID WC  . insulin aspart  0-5 Units Subcutaneous QHS  . insulin glargine  35 Units Subcutaneous QHS  . sodium zirconium cyclosilicate  5 g Oral Once    Assessment: Patient is a 63 yof that is admitted for [place]. Patient has been short of breath, tachycardic, elevated D-dimer, and requiring increasing amounts of oxygen. At this time the admitting team has asked  pharmacy to dose heparin for a possible PE. Patients dopplers were negative for DVT. Goal of Therapy:  Heparin level 0.3-0.7 units/ml Monitor platelets by anticoagulation protocol: Yes   Plan:  - Heparin bolus 4000 units IV x 1 dose (subq heparin dose @1100 )  - Heparin drip @ 1250 units/hr - Heparin level in ~ 6 hours  - Monitor patient for s/s of bleeding and CBC while on heparin   Duanne Limerick PharmD. BCPS  11/24/2020,12:54 PM

## 2020-11-24 NOTE — Progress Notes (Signed)
   11/24/20 1945  Assess: MEWS Score  Temp (!) 100.5 F (38.1 C)  BP (!) 144/77  Pulse Rate (!) 108  ECG Heart Rate (!) 108  Resp (!) 28  Level of Consciousness Alert  SpO2 93 %  O2 Device Room Air  Assess: MEWS Score  MEWS Temp 1  MEWS Systolic 0  MEWS Pulse 1  MEWS RR 2  MEWS LOC 0  MEWS Score 4  MEWS Score Color Red  Assess: if the MEWS score is Yellow or Red  Were vital signs taken at a resting state? Yes  Focused Assessment No change from prior assessment  Early Detection of Sepsis Score *See Row Information* Low  MEWS guidelines implemented *See Row Information* Yes  Treat  MEWS Interventions Administered prn meds/treatments  Pain Scale 0-10  Pain Score 0  Take Vital Signs  Increase Vital Sign Frequency  Red: Q 1hr X 4 then Q 4hr X 4, if remains red, continue Q 4hrs  Escalate  MEWS: Escalate Red: discuss with charge nurse/RN and provider, consider discussing with RRT  Notify: Charge Nurse/RN  Name of Charge Nurse/RN Notified Lexine Baton, Agricultural consultant  Date Charge Nurse/RN Notified 11/24/20  Time Charge Nurse/RN Notified 1945  Notify: Provider  Provider Name/Title Allyson Sabal, MD  Date Provider Notified 11/24/20  Time Provider Notified 1948  Notification Type Page  Notification Reason Change in status (RED MEWS)  Response No new orders  Date of Provider Response 11/24/20  Time of Provider Response 1950  Document  Patient Outcome Other (Comment) (Gave PRN tylenol and will recheck temp.)  Progress note created (see row info) Yes

## 2020-11-24 NOTE — Progress Notes (Addendum)
Subjective:   Patient states that she feels roughly the same this morning as last night. Continued generalized weakness, muscle aches, cough. Sometimes feels like she is "gasping" for air. States she was able to eat some of dinner last night and has more of an appetite today.   Objective:  Vital signs in last 24 hours: Vitals:   11/24/20 0045 11/24/20 0245 11/24/20 0545 11/24/20 0630  BP:   (!) 129/105 (!) 120/93  Pulse: (!) 101 (!) 102 (!) 102 (!) 104  Resp: (!) 24 (!) 26 (!) 28 (!) 26  Temp:      TempSrc:      SpO2: 92% 97% 95% 91%    Physical Exam Constitutional: no acute distress Head: atraumatic ENT: external ears normal Eyes: EOMI Cardiovascular: regular rate and rhythm, normal heart sounds Pulmonary: effort normal, lungs clear to ascultation bilaterally, on room air Abdominal: flat, nontender, no rebound tenderness, bowel sounds normal Musculoskeletal: bilateral calf tenderness, no skin changes Skin: warm and dry Neurological: alert, no focal deficit Psychiatric: normal mood and affect  Assessment/Plan: Kristy [name] is a 65 y.o. female with hx of CKD 4, insulin dependent diabetes, renovascular HTN due to fibromuscular dysplasia, HLD, OSA presenting for generalized weakness, found to have COVID-19. Also has an AKI, likely 2/2 poor PO intake.  Active Problems:   AKI (acute kidney injury) ([place])   COVID-19 pneumonia Not vaccinated. CXR with pneumonia but no oxygen requirement. Reports non-productive cough. Main symptoms are loss of taste, poor appetite, myalgias, and generalized weakness. Symptoms started 2 weeks ago so outside of window for mAbs. PCT elevated, likely related to CKD. Lactic acid negative. Inflammatory markers moderately elevated.  Recent Labs    11/23/20 1730 11/24/20 0951  CRP 15.2*  --   DDIMER 2.14* 3.07*  FERRITIN 740*  --   -Hold off on COVID specific medications for now -Monitor pulse oxygenation -Robitussin as needed -trend  inflammatory markers  Suspected PE Patient with occasional "gasping" this morning, on 2L O2 but saturating well on RA. Remains tachycardic despite IVF overnight. Ddimer from 2.14 > 3.07.  -f/u LE doppler given calf tenderness and COVID infection -f/u VQ scan (no CTA for AKI, likely will be completed on Monday) -continue prophylactic heparin  AKI on CKD 4 Hyperkalemia Creatinine of 2.82 < 3.82 on admission. Baseline has been progressing recently but seems to be around 1.7-2.0.  Most likely prerenal with history of poor p.o. intake for 2 weeks.  Potassium of 5.4 on admission, received Lokelma last night. no chest pain or palpitations.  EKG with borderline high T waves. -another dose of Lokelma 5 g -continue mIVF at 100cc/hr -Avoid nephrotoxins -Follow-up urinalysis -Strict intake and output -Daily BMP  DM type II with peripheral neuropathy Last A1c of 7.4 on 10/14/20. Home meds of Insulin Degludec-Liraglutide (Xutolphy) 40 units daily.  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 Unclear if she is taking hyzaar or spironolactone, -Hold for now as blood pressure is soft  HLD Home med of atorvastatin 40mg  daily and ASA 81mg . -Resume home aspirin  OSA Does not use CPAP at home because the mask does not fit, has been referred to sleep medicine for mask fitting. -CPAP nightly  Diet:  carb modified IVF:  NS VTE:  heparin Prior to Admission Living Arrangement:  home Anticipated Discharge Location:  Home Barriers to Discharge:  AKI, requiring IV hydration Dispo: Anticipated discharge  in approximately 1-2 day(s).   Andrew Au, MD 11/24/2020, 7:08 AM Pager: [phone] After 5pm on weekdays and 1pm on weekends: On Call pager [phone]

## 2020-11-24 NOTE — Progress Notes (Signed)
ANTICOAGULATION CONSULT NOTE  Pharmacy Consult for Heparin Indication: pulmonary embolus rule out  No Known Allergies  Patient Measurements: Height: 5\' 3"  (160 cm) Weight: 97 kg (213 lb 13.5 oz) IBW/kg (Calculated) : 52.4 Heparin Dosing Weight: 75 kg  Vital Signs: Temp: 98.1 F (36.7 C) (01/30 2145) Temp Source: Axillary (01/30 2145) BP: 105/58 (01/30 2145) Pulse Rate: 100 (01/30 2145)  Labs: Recent Labs    11/23/20 1319 11/23/20 2007 11/24/20 0951 11/24/20 2012  HGB 14.6  --  13.5  --   HCT 47.1*  --  41.7  --   PLT 230  --  223  --   HEPARINUNFRC  --   --   --  1.62*  CREATININE 3.82*  --  2.82*  --   TROPONINIHS  --  42* 51*  --     Estimated Creatinine Clearance: 22.3 mL/min (A) (by C-G formula based on SCr of 2.82 mg/dL (H)).   Medical History: Past Medical History:  Diagnosis Date  . Colon polyps   . Colon polyps   . Diabetes mellitus   . Diverticulosis   . Hyperlipidemia   . Hypertension   . Renal artery stenosis ([place]) 2007    status post selective bilateral renal angiography, balloon angiopathy of the left renal artery, first diagnosed in 2007 based on MRA of the renal arteries which 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 Albertine Patricia  . Sleep apnea 2007    status post polysomnogram 2007 , suggested use of CPAP  . Ventricular hypertrophy 2006    a 2-D echo in 2006, ejection fraction 55%, mild tricuspid regurgitation noted as well, 2-D echo November 13, 8262 showed diastolic dysfunction with LVEF normal of 65%    Medications:  Scheduled:  . insulin aspart  0-15 Units Subcutaneous TID WC  . insulin aspart  0-5 Units Subcutaneous QHS  . insulin glargine  35 Units Subcutaneous QHS    Assessment: Patient is a 20 yof that is admitted for COVID. Patient has been short of breath, tachycardic, elevated D-dimer, and requiring increasing amounts of oxygen. At this time the admitting team has asked pharmacy to dose heparin for a possible PE.  Patients dopplers were negative for DVT  PM f/u > heparin level elevated at 1.62.  Confirmed level drawn from arm opposite of where heparin is infusing.  No overt bleeding or complications noted.  Goal of Therapy:  Heparin level 0.3-0.7 units/ml Monitor platelets by anticoagulation protocol: Yes   Plan:  - Hold IV heparin x 1 hr. -Then restart drip at 1000 units/hr. -Check heparin level 8 hrs after gtt resumed -Daily heparin level and CBC.  [place] Crane, Roylene Reason, BCCP Clinical Pharmacist  11/24/2020 10:18 PM   [facility] pharmacy phone numbers are listed on [email or web address]

## 2020-11-24 NOTE — Progress Notes (Signed)
   11/24/20 1704  Assess: MEWS Score  Temp 98.4 F (36.9 C)  BP (!) 108/96  Pulse Rate (!) 105  ECG Heart Rate (!) 106  Resp (!) 24  Level of Consciousness Alert  SpO2 92 %  O2 Device Room Air  Assess: MEWS Score  MEWS Temp 0  MEWS Systolic 0  MEWS Pulse 1  MEWS RR 1  MEWS LOC 0  MEWS Score 2  MEWS Score Color Yellow  Assess: if the MEWS score is Yellow or Red  Were vital signs taken at a resting state? Yes  Focused Assessment No change from prior assessment  Early Detection of Sepsis Score *See Row Information* Medium  MEWS guidelines implemented *See Row Information* Yes  Treat  MEWS Interventions Escalated (See documentation below)  Take Vital Signs  Increase Vital Sign Frequency  Yellow: Q 2hr X 2 then Q 4hr X 2, if remains yellow, continue Q 4hrs  Escalate  MEWS: Escalate Yellow: discuss with charge nurse/RN and consider discussing with provider and RRT  Notify: Charge Nurse/RN  Name of Charge Nurse/RN Notified Liana, RN  Date Charge Nurse/RN Notified 11/24/20  Time Charge Nurse/RN Notified 1800  Document  Patient Outcome Stabilized after interventions  Progress note created (see row info) Yes

## 2020-11-24 NOTE — Plan of Care (Signed)

## 2020-11-24 NOTE — ED Notes (Signed)
Tele Breakfast order placed 

## 2020-11-24 NOTE — Progress Notes (Signed)
Date: 11/24/2020  Patient name: Kristy [name]  Medical record number: [id number]  Date of birth: Nov 12, 1955   I have seen and evaluated [place] and discussed their care with the Residency Team.  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  Patient was seen in [facility] on January 19 for follow-up of her diabetes and hypertension.  Patient states that after that visit she developed decreased appetite and had decreased oral intake.  Approximately 7 to 10 days ago she started noticing generalized weakness which gradually progressed.  Patient also complained of an associated cough which is nonproductive as well as an associated sore throat.  Patient did not take the Covid vaccine.  Patient also complains of associated muscle aches.  She presented to the ED for further evaluation as noted to have AKI with a creatinine of 3.8 up from a baseline of approximately 1.7-2.  Patient was also noted to be Covid positive.  No chest pain, no palpitations, no lightheadedness, no syncope, no focal weakness, no tingling or numbness, no nausea or vomiting, no diarrhea, no abdominal pain.  Today, patient states that her appetite feels a little better but she still has persistent weakness, cough and myalgias.  PMHx, Fam Hx, and/or Soc Hx : As per resident admit note  Vitals:   11/24/20 1007 11/24/20 1107  BP:  123/88  Pulse: (!) 112 (!) 109  Resp: 20 (!) 24  Temp: 99.3 F (37.4 C) 99.5 F (37.5 C)  SpO2: 100% 91%   General: Awake, alert, oriented x3, NAD CVS: Tachycardic, normal heart sounds Lungs: CTA bilaterally Abdomen: Soft, nontender, nondistended, normoactive bowel sounds Extremities: No edema noted, bilateral lower extremity calf tenderness  noted Psych: Normal mood and affect HEENT: Normocephalic, atraumatic Skin: Warm and dry  Assessment and Plan: I have seen and evaluated the patient as outlined above. I agree with the formulated Assessment and Plan as detailed in the residents' note, with the following changes:   1.  COVID-19 pneumonia: -Patient presented ED with progressive weakness and decreased appetite and was found to be Covid positive.  Chest x-ray showed bilateral hazy opacities consistent with COVID-19 pneumonia. -Patient O2 sats remained in the 90s on room air -She is approximately 10 days or more out from her symptom onset and would not be a candidate for monoclonal antibodies -Currently she does not meet the criteria for treatment with Decadron. -We will continue to monitor closely as she is at high risk for decompensation -Patient's AKI is likely secondary to her decreased appetite and poor oral intake from her Covid infection.  Patient's creatinine has improved down to 2.8 today from 3.8 on admission.  We will continue to monitor BMP closely.  Continue with IV fluids for now -Patient appetite is slowly improving.  We will continue to monitor -Of note, patient does have elevated inflammatory markers with a D-dimer of 3.07 as well as an elevated ferritin.  We will continue to monitor closely  2.  Possible PE: -Patient remains persistently tachycardic despite IV fluids and does complain of some shortness of breath associated with her cough concerning for possible ED.  Her EKG shows sinus tachycardia with an S wave in lead I and a Q wave in lead III.  She also complains of bilateral calf tenderness concerning for possible  DVT. -Given her elevated D-dimer and persistent tachycardia despite IV hydration I am concerned for possible PE given her underlying Covid infection -We will start the patient on therapeutic heparin and obtain a VQ scan -We will also follow-up her lower extremity Dopplers -No further work-up at this  time  Aldine Contes, MD 1/30/202212:07 PM

## 2020-11-25 ENCOUNTER — Inpatient Hospital Stay (HOSPITAL_COMMUNITY): Payer: Medicare Other

## 2020-11-25 ENCOUNTER — Encounter: Payer: Self-pay | Admitting: Internal Medicine

## 2020-11-25 DIAGNOSIS — U071 COVID-19: Secondary | ICD-10-CM | POA: Diagnosis not present

## 2020-11-25 LAB — COMPREHENSIVE METABOLIC PANEL
ALT: 38 U/L (ref 0–44)
AST: 53 U/L — ABNORMAL HIGH (ref 15–41)
Albumin: 2.2 g/dL — ABNORMAL LOW (ref 3.5–5.0)
Alkaline Phosphatase: 63 U/L (ref 38–126)
Anion gap: 9 (ref 5–15)
BUN: 58 mg/dL — ABNORMAL HIGH (ref 8–23)
CO2: 18 mmol/L — ABNORMAL LOW (ref 22–32)
Calcium: 8.6 mg/dL — ABNORMAL LOW (ref 8.9–10.3)
Chloride: 114 mmol/L — ABNORMAL HIGH (ref 98–111)
Creatinine, Ser: 2.36 mg/dL — ABNORMAL HIGH (ref 0.44–1.00)
GFR, Estimated: 22 mL/min — ABNORMAL LOW (ref >60)
Glucose, Bld: 177 mg/dL — ABNORMAL HIGH (ref 70–99)
Potassium: 4.9 mmol/L (ref 3.5–5.1)
Sodium: 141 mmol/L (ref 135–145)
Total Bilirubin: 0.8 mg/dL (ref 0.3–1.2)
Total Protein: 5.9 g/dL — ABNORMAL LOW (ref 6.5–8.1)

## 2020-11-25 LAB — CBC WITH DIFFERENTIAL/PLATELET
Abs Immature Granulocytes: 0.14 10*3/uL — ABNORMAL HIGH (ref 0.00–0.07)
Basophils Absolute: 0 10*3/uL (ref 0.0–0.1)
Basophils Relative: 1 %
Eosinophils Absolute: 0 10*3/uL (ref 0.0–0.5)
Eosinophils Relative: 0 %
HCT: 41.4 % (ref 36.0–46.0)
Hemoglobin: 13 g/dL (ref 12.0–15.0)
Immature Granulocytes: 2 %
Lymphocytes Relative: 16 %
Lymphs Abs: 1.1 10*3/uL (ref 0.7–4.0)
MCH: 27.4 pg (ref 26.0–34.0)
MCHC: 31.4 g/dL (ref 30.0–36.0)
MCV: 87.2 fL (ref 80.0–100.0)
Monocytes Absolute: 0.4 10*3/uL (ref 0.1–1.0)
Monocytes Relative: 6 %
Neutro Abs: 4.9 10*3/uL (ref 1.7–7.7)
Neutrophils Relative %: 75 %
Platelets: 254 10*3/uL (ref 150–400)
RBC: 4.75 MIL/uL (ref 3.87–5.11)
RDW: 15.2 % (ref 11.5–15.5)
WBC: 6.5 10*3/uL (ref 4.0–10.5)
nRBC: 0.8 % — ABNORMAL HIGH (ref 0.0–0.2)

## 2020-11-25 LAB — HEPARIN LEVEL (UNFRACTIONATED): Heparin Unfractionated: 1.12 IU/mL — ABNORMAL HIGH (ref 0.30–0.70)

## 2020-11-25 LAB — C-REACTIVE PROTEIN: CRP: 12.9 mg/dL — ABNORMAL HIGH (ref <1.0)

## 2020-11-25 LAB — GLUCOSE, CAPILLARY
Glucose-Capillary: 127 mg/dL — ABNORMAL HIGH (ref 70–99)
Glucose-Capillary: 205 mg/dL — ABNORMAL HIGH (ref 70–99)
Glucose-Capillary: 117 mg/dL — ABNORMAL HIGH (ref 70–99)
Glucose-Capillary: 149 mg/dL — ABNORMAL HIGH (ref 70–99)

## 2020-11-25 LAB — D-DIMER, QUANTITATIVE: D-Dimer, Quant: 2.28 ug/mL-FEU — ABNORMAL HIGH (ref 0.00–0.50)

## 2020-11-25 LAB — FERRITIN: Ferritin: 931 ng/mL — ABNORMAL HIGH (ref 11–307)

## 2020-11-25 MED ORDER — TECHNETIUM TO 99M ALBUMIN AGGREGATED
4.2000 | Freq: Once | INTRAVENOUS | Status: AC | PRN
  Administered 2020-11-25: 4.2 via INTRAVENOUS

## 2020-11-25 MED ORDER — LACTATED RINGERS IV SOLN: INTRAVENOUS | Status: AC

## 2020-11-25 MED ORDER — HEPARIN (PORCINE) 25000 UT/250ML-% IV SOLN
800.0000 [IU]/h | INTRAVENOUS | Status: DC
  Filled 2020-11-25: qty 250

## 2020-11-25 MED ORDER — ENOXAPARIN SODIUM 30 MG/0.3ML ~~LOC~~ SOLN
30.0000 mg | SUBCUTANEOUS | Status: DC
  Administered 2020-11-25: 30 mg via SUBCUTANEOUS
  Filled 2020-11-25: qty 0.3

## 2020-11-25 NOTE — Progress Notes (Signed)
Patient refused CPAP for tonight. RT will monitor as needed. ?

## 2020-11-25 NOTE — Evaluation (Signed)
Physical Therapy Evaluation Patient Details Name: Kristy [name] MRN: [id number] DOB: 1956-04-14 Today's Date: 11/25/2020   History of Present Illness  65 y.o. female with hx of CKD 4, insulin dependent diabetes, renovascular HTN due to fibromuscular dysplasia, HLD, OSA presenting for generalized weakness. In ED found to be COVID +, CXR with PNA, but no O2 requirement. Admitted 11/23/20 for treatment of COVID and AKI, Pt D-dimer increased and placed on heparin for suspected PE, Dopplers negative for DVT  Clinical Impression  PTA pt living with husband, son and teenage granddaughter in single story home with 5 steps to enter. Pt reports independence with community level ambulation, ADLs and iADLs, drives regularly. Pt is currently limited in safe mobility by decreased strength, ROM, and balance. Pt is currently modA for bed mobility and mod A for transfer to recliner. PT recommends SNF level rehab prior to return home to improve safe mobility. PT will continue to follow acutely.     Follow Up Recommendations SNF    Equipment Recommendations  Rolling walker with 5" wheels;3in1 (PT)    Recommendations for Other Services OT consult     Precautions / Restrictions Precautions Precautions: Fall Restrictions Weight Bearing Restrictions: No      Mobility  Bed Mobility Overal bed mobility: Needs Assistance Bed Mobility: Supine to Sit     Supine to sit: Mod assist     General bed mobility comments: able to move LE towards EoB, requires modA for reaching across body to bedrail and for LE management off bed and trunk to upright, attempts to scoot hips to EoB ultimately needs assist    Transfers Overall transfer level: Needs assistance Equipment used: 1 person hand held assist Transfers: Sit to/from [employer] Sit to Stand: Total assist   Squat pivot transfers: Max assist;From elevated surface     General transfer comment: total A to come to standing, despite maximal  cuing, pt unable to achieve fully upright, maxA for squat pivot transfer to drop arm recliner on her R  Ambulation/Gait             General Gait Details: unable        Balance Overall balance assessment: Needs assistance Sitting-balance support: Feet supported;No upper extremity supported;Single extremity supported;Bilateral upper extremity supported Sitting balance-Leahy Scale: Fair       Standing balance-Leahy Scale: Zero Standing balance comment: unable to come to standing                             Pertinent Vitals/Pain Pain Assessment: Faces Faces Pain Scale: Hurts little more Pain Location: joints with mobilization Pain Descriptors / Indicators: Grimacing;Guarding;Moaning Pain Intervention(s): Limited activity within patient's tolerance;Monitored during session;Repositioned    Home Living Family/patient expects to be discharged to:: Private residence Living Arrangements: Spouse/significant other Available Help at Discharge: Family;Available 24 hours/day (husband is retired, son and 73 yo granddaughter also there) Type of Home: House Home Access: Stairs to enter Entrance Stairs-Rails: Psychiatric nurse of Steps: [place]: One level [place]: None      Prior Function Level of Independence: Independent         Comments: reports independence with community ambulation, driving,     Hand Dominance        Extremity/Trunk Assessment   Upper Extremity Assessment Upper Extremity Assessment: Generalized weakness    Lower Extremity Assessment Lower Extremity Assessment: Generalized weakness;RLE deficits/detail;LLE deficits/detail RLE Sensation: history of peripheral neuropathy LLE Deficits /  Details: L knee limited end range flex/ext LLE Sensation: history of peripheral neuropathy       Communication   Communication: No difficulties  Cognition Arousal/Alertness: Awake/alert Behavior During Therapy: WFL for  tasks assessed/performed Overall Cognitive Status: Within Functional Limits for tasks assessed                                        General Comments General comments (skin integrity, edema, etc.): VSS on RA    Exercises General Exercises - Lower Extremity Ankle Circles/Pumps: AROM;Both;10 reps;Supine Heel Slides: AROM;Both;10 reps;Supine   Assessment/Plan    PT Assessment Patient needs continued PT services  PT Problem List Decreased strength;Decreased range of motion;Decreased activity tolerance;Decreased balance;Decreased mobility;Decreased knowledge of use of DME;Decreased safety awareness       PT Treatment Interventions DME instruction;Gait training;Stair training;Functional mobility training;Therapeutic activities;Therapeutic exercise;Balance training;Cognitive remediation;Patient/family education    PT Goals (Current goals can be found in the Care Plan section)  Acute Rehab PT Goals Patient Stated Goal: go shopping with her granddaughter PT Goal Formulation: With patient Time For Goal Achievement: 12/09/20 Potential to Achieve Goals: Fair    Frequency Min 2X/week    AM-PAC PT "6 Clicks" Mobility  Outcome Measure Help needed turning from your back to your side while in a flat bed without using bedrails?: A Little Help needed moving from lying on your back to sitting on the side of a flat bed without using bedrails?: A Lot Help needed moving to and from a bed to a chair (including a wheelchair)?: A Lot Help needed standing up from a chair using your arms (e.g., wheelchair or bedside chair)?: A Lot Help needed to walk in hospital room?: Total Help needed climbing 3-5 steps with a railing? : Total 6 Click Score: 11    End of Session Equipment Utilized During Treatment: Gait belt Activity Tolerance: Patient limited by fatigue Patient left: in chair;with call bell/phone within reach;with chair alarm set;Other (comment) (pt requesting linens  changed) Nurse Communication: Mobility status PT Visit Diagnosis: Unsteadiness on feet (R26.81);Other abnormalities of gait and mobility (R26.89);Muscle weakness (generalized) (M62.81);Difficulty in walking, not elsewhere classified (R26.2)    Time: 1470-9295 PT Time Calculation (min) (ACUTE ONLY): 39 min   Charges:   PT Evaluation $PT Eval Moderate Complexity: 1 Mod PT Treatments $Therapeutic Activity: 8-22 mins        Shereda Graw B. Migdalia Dk PT, DPT Acute Rehabilitation Services Pager [phone] Office [phone]   [place] 11/25/2020, 4:01 PM

## 2020-11-25 NOTE — Progress Notes (Signed)
   Subjective:   She is feeling slightly better today but is still feeling weak. Slight cough and slight shortness of breath. Her appetite is improving.  She denies dysuria, difficulty with urination, increased or decreased frequency.  Objective:  Vital signs in last 24 hours: Vitals:   11/24/20 2239 11/25/20 0036 11/25/20 0436 11/25/20 0819  BP: 101/75 124/78 119/81 135/80  Pulse: 100 100 97 99  Resp: 18 20 16 19   Temp: 98.3 F (36.8 C) 98 F (36.7 C) 98 F (36.7 C) 98.1 F (36.7 C)  TempSrc: Axillary Axillary Axillary Axillary  SpO2: 93% 96% 90% 92%  Weight:      Height:        Physical Exam Constitutional: no acute distress, obese Head: atraumatic ENT: external ears normal Eyes: EOMI, no icterus or injection  Cardiovascular: regular rate and rhythm, normal heart sounds, no LE edema  Pulmonary: effort normal, lungs clear to ascultation bilaterally, on room air Abdominal: flat, nontender, no rebound tenderness, bowel sounds normal Skin: warm and dry Neurological: alert, no focal deficit Psychiatric: normal mood and affect  Assessment/Plan: Kristy [name] is a 65 y.o. female with hx of CKD 4, insulin dependent diabetes, renovascular HTN due to fibromuscular dysplasia, HLD, OSA presenting for generalized weakness, found to have COVID-19. Also has an AKI, likely 2/2 poor PO intake.  Active Problems:   AKI (acute kidney injury) ([place])   Pneumonia due to COVID-19 virus   COVID-19 pneumonia Not vaccinated. Sats 88-92% on room air. Weakness improving. Inflammatory markers up and down, moderately elevated    -cont. Trending inflammatory markers.  -supplemental O2 for oxygen <88%  -robitussin prn  -PT/OT consulted   Suspected PE Increased shortness of breath yesterday with S1Q3 on ecg, ongoing tachycardia with fluid resuscitation  - v/q scan pending required cxr within 24 hours of scan  - cont. therepeutic heparin for now  - hgb stable  - b/l LE doppler negative for  DVT   AKI on CKD 4 Hyperkalemia - resolved  Creatinine continuing to improve with fluids. Hyperkalemia resolved. UA does show nitrites, hemoglobin, small leuks, and rare bacteria. She is asymptomatic.  -continue mIVF at 100cc/hr for 12 hours.  -Avoid nephrotoxins -repeat UA prior to discharge with hemoglobin - UA taken prior to starting therapeutic heparin -Strict intake and output -Daily BMP  DM type II with peripheral neuropathy Last A1c of 7.4 on 10/14/20. Home meds of Insulin Degludec-Liraglutide (Xutolphy) 40 units daily.  -cont. SSI moderate -lantus decreased to 17U yesterday evening due to poor po intake   Revascular hypertension 2/2 fibromuscular dysplasia Home meds of coreg 25mg  BID, losartan-hctz (hyzaar) 100-25mg , spironolactone 50mg , amlodipine 5mg . Unclear if she is taking hyzaar or spironolactone, -continue to hold, normotensive   HLD Home med of atorvastatin 40mg  daily and ASA 81mg . -Resume home aspirin  OSA Does not use CPAP at home because the mask does not fit, has been referred to sleep medicine for mask fitting. -CPAP nightly  Diet:  carb modified IVF:  LR VTE:  heparin Prior to Admission Living Arrangement:  home Anticipated Discharge Location:  Home Dispo: Anticipated discharge in approximately 1-2 day(s).   [name], DO 11/25/2020, 11:36 AM Pager: [phone] After 5pm on weekdays and 1pm on weekends: On Call pager [phone]

## 2020-11-25 NOTE — Progress Notes (Signed)
ANTICOAGULATION CONSULT NOTE  Pharmacy Consult for Heparin Indication: pulmonary embolus rule out  No Known Allergies  Patient Measurements: Height: 5\' 3"  (160 cm) Weight: 97 kg (213 lb 13.5 oz) IBW/kg (Calculated) : 52.4 Heparin Dosing Weight: 75 kg  Vital Signs: Temp: 98.1 F (36.7 C) (01/31 0819) Temp Source: Axillary (01/31 0819) BP: 135/80 (01/31 0819) Pulse Rate: 99 (01/31 0819)  Labs: Recent Labs    11/23/20 1319 11/23/20 2007 11/24/20 0951 11/24/20 2012 11/25/20 0645  HGB 14.6  --  13.5  --  13.0  HCT 47.1*  --  41.7  --  41.4  PLT 230  --  223  --  254  HEPARINUNFRC  --   --   --  1.62* 1.12*  CREATININE 3.82*  --  2.82*  --  2.36*  TROPONINIHS  --  42* 51*  --   --     Estimated Creatinine Clearance: 26.7 mL/min (A) (by C-G formula based on SCr of 2.36 mg/dL (H)).   Medical History: Past Medical History:  Diagnosis Date  . Colon polyps   . Colon polyps   . Diabetes mellitus   . Diverticulosis   . Hyperlipidemia   . Hypertension   . 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 Albertine Patricia  . Sleep apnea 2007    status post polysomnogram 2007 , suggested use of CPAP  . Ventricular hypertrophy 2006    a 2-D echo in 2006, ejection fraction 55%, mild tricuspid regurgitation noted as well, 2-D echo November 13, 6220 showed diastolic dysfunction with LVEF normal of 65%    Medications:  Scheduled:  . insulin aspart  0-15 Units Subcutaneous TID WC  . insulin aspart  0-5 Units Subcutaneous QHS  . insulin glargine  35 Units Subcutaneous QHS    Assessment: Patient is a 75 yof that is admitted for COVID. Patient has been short of breath, tachycardic, elevated D-dimer, and requiring increasing amounts of oxygen. At this time the admitting team has asked  pharmacy to dose heparin for a possible PE. Patients dopplers were negative for DVT  Heparin still came back elevated at 1.12. It has been running ok per Rn. We will hold and reduce rate again.   Goal of Therapy:  Heparin level 0.3-0.7 units/ml Monitor platelets by anticoagulation protocol: Yes   Plan:  - Hold IV heparin x 1 hr. -Then restart drip at 800 units/hr. -Check heparin level 8 hrs after gtt resumed -Daily heparin level and CBC.  Onnie Boer, PharmD, BCIDP, AAHIVP, CPP Infectious Disease Pharmacist 11/25/2020 8:49 AM

## 2020-11-26 ENCOUNTER — Telehealth: Payer: Self-pay

## 2020-11-26 DIAGNOSIS — G4733 Obstructive sleep apnea (adult) (pediatric): Secondary | ICD-10-CM | POA: Diagnosis not present

## 2020-11-26 DIAGNOSIS — N179 Acute kidney failure, unspecified: Secondary | ICD-10-CM

## 2020-11-26 DIAGNOSIS — J1282 Pneumonia due to coronavirus disease 2019: Secondary | ICD-10-CM | POA: Diagnosis not present

## 2020-11-26 DIAGNOSIS — U071 COVID-19: Secondary | ICD-10-CM | POA: Diagnosis not present

## 2020-11-26 LAB — CBC WITH DIFFERENTIAL/PLATELET
Abs Immature Granulocytes: 0 10*3/uL (ref 0.00–0.07)
Basophils Absolute: 0 10*3/uL (ref 0.0–0.1)
Basophils Relative: 0 %
Eosinophils Absolute: 0 10*3/uL (ref 0.0–0.5)
Eosinophils Relative: 0 %
HCT: 38.4 % (ref 36.0–46.0)
Hemoglobin: 12.6 g/dL (ref 12.0–15.0)
Lymphocytes Relative: 13 %
Lymphs Abs: 0.9 10*3/uL (ref 0.7–4.0)
MCH: 28.1 pg (ref 26.0–34.0)
MCHC: 32.8 g/dL (ref 30.0–36.0)
MCV: 85.5 fL (ref 80.0–100.0)
Monocytes Absolute: 0.1 10*3/uL (ref 0.1–1.0)
Monocytes Relative: 2 %
Neutro Abs: 6 10*3/uL (ref 1.7–7.7)
Neutrophils Relative %: 85 %
Platelets: 241 10*3/uL (ref 150–400)
RBC: 4.49 MIL/uL (ref 3.87–5.11)
RDW: 15.1 % (ref 11.5–15.5)
WBC: 7 10*3/uL (ref 4.0–10.5)
nRBC: 0 /100 WBC
nRBC: 0.7 % — ABNORMAL HIGH (ref 0.0–0.2)

## 2020-11-26 LAB — COMPREHENSIVE METABOLIC PANEL
ALT: 33 U/L (ref 0–44)
AST: 40 U/L (ref 15–41)
Albumin: 2.1 g/dL — ABNORMAL LOW (ref 3.5–5.0)
Alkaline Phosphatase: 52 U/L (ref 38–126)
Anion gap: 10 (ref 5–15)
BUN: 46 mg/dL — ABNORMAL HIGH (ref 8–23)
CO2: 18 mmol/L — ABNORMAL LOW (ref 22–32)
Calcium: 8.7 mg/dL — ABNORMAL LOW (ref 8.9–10.3)
Chloride: 111 mmol/L (ref 98–111)
Creatinine, Ser: 2.03 mg/dL — ABNORMAL HIGH (ref 0.44–1.00)
GFR, Estimated: 27 mL/min — ABNORMAL LOW (ref >60)
Glucose, Bld: 142 mg/dL — ABNORMAL HIGH (ref 70–99)
Potassium: 4.7 mmol/L (ref 3.5–5.1)
Sodium: 139 mmol/L (ref 135–145)
Total Bilirubin: 1 mg/dL (ref 0.3–1.2)
Total Protein: 5.9 g/dL — ABNORMAL LOW (ref 6.5–8.1)

## 2020-11-26 LAB — GLUCOSE, CAPILLARY
Glucose-Capillary: 73 mg/dL (ref 70–99)
Glucose-Capillary: 174 mg/dL — ABNORMAL HIGH (ref 70–99)

## 2020-11-26 LAB — C-REACTIVE PROTEIN: CRP: 13.2 mg/dL — ABNORMAL HIGH (ref <1.0)

## 2020-11-26 LAB — FERRITIN: Ferritin: 749 ng/mL — ABNORMAL HIGH (ref 11–307)

## 2020-11-26 LAB — D-DIMER, QUANTITATIVE: D-Dimer, Quant: 1.84 ug/mL-FEU — ABNORMAL HIGH (ref 0.00–0.50)

## 2020-11-26 NOTE — TOC Initial Note (Signed)
Transition of Care [facility]) - Initial/Assessment Note    Patient Details  Name: Kristy [name] MRN: [id number] Date of Birth: May 20, 1956  [facility]) CM/SW Contact:    Benard Halsted, LCSW Phone Number: 11/26/2020, 9:27 AM  Clinical Narrative:                 CSW received consult for possible SNF placement at time of discharge. CSW spoke with patient. Patient reported that she does not want to go to SNF and would like home health services instead. She reported that she stays with family at home and they will be able to care for her as they always have. Patient reported no preference for agency as long as it is in network with her insurance. CSW discussed equipment needs with patient and she requested a rolling walker and 3in1 BSC. CSW confirmed PCP and address with patient. She stated she does not where any oxygen at home and is currently on room air. Patient states her family will come pick her up at discharge. No further questions reported at this time.    Expected Discharge Plan: [place] Barriers to Discharge: Continued Medical Work up   Patient Goals and CMS Choice Patient states their goals for this hospitalization and ongoing recovery are:: Return home CMS Medicare.gov Compare Post Acute Care list provided to:: Patient Choice offered to / list presented to : Patient  Expected Discharge Plan and Services Expected Discharge Plan: [place] In-house Referral: Clinical Social Work Discharge Planning Services: CM Consult Post Acute Care Choice: [place] Living arrangements for the past 2 months: [place]                 DME Arranged: 3-N-1,Walker rolling         HH Arranged: PT,OT          Prior Living Arrangements/Services Living arrangements for the past 2 months: Single Family Home Lives with:: Adult Children,Spouse Patient language and need for interpreter reviewed:: Yes Do you  feel safe going back to the place where you live?: Yes      Need for Family Participation in Patient Care: No (Comment) Care giver support system in place?: Yes (comment)   Criminal Activity/Legal Involvement Pertinent to Current Situation/Hospitalization: No - Comment as needed  Activities of Daily Living Home Assistive Devices/Equipment: Eyeglasses ADL Screening (condition at time of admission) Patient's cognitive ability adequate to safely complete daily activities?: Yes Is the patient deaf or have difficulty hearing?: No Does the patient have difficulty seeing, even when wearing glasses/contacts?: No Does the patient have difficulty concentrating, remembering, or making decisions?: No Patient able to express need for assistance with ADLs?: Yes Does the patient have difficulty dressing or bathing?: No Independently performs ADLs?: Yes (appropriate for developmental age) Does the patient have difficulty walking or climbing stairs?: No Weakness of Legs: Both Weakness of Arms/Hands: None  Permission Sought/Granted Permission sought to share information with : Facility Art therapist granted to share information with : Yes, Verbal Permission Granted     Permission granted to share info w AGENCY: HH/DME        Emotional Assessment   Attitude/Demeanor/Rapport: Engaged,Gracious Affect (typically observed): Accepting,Appropriate,Pleasant Orientation: : Oriented to Self,Oriented to Place,Oriented to  Time,Oriented to Situation Alcohol / Substance Use: Not Applicable Psych Involvement: No (comment)  Admission diagnosis:  AKI (acute kidney injury) ([place]) [N17.9] Pneumonia due to COVID-19 virus [U07.1, J12.82] Patient Active Problem List  Diagnosis Date Noted  . Pneumonia due to COVID-19 virus 11/24/2020  . AKI (acute kidney injury) ([place]) 11/23/2020  . Lower extremity edema 06/08/2019  . Osteoarthritis of both knees 07/29/2018  . Obstructive sleep apnea  10/07/2017  . Claudication ([place]) 10/07/2017  . CKD (chronic kidney disease) stage 4, GFR 15-29 ml/min (HCC) 06/04/2017  . Hypercalcemia 06/26/2016  . Vitamin D deficiency 12/07/2014  . Moderate major depression ([place]) 08/09/2014  . Chronic, continuous use of opioids 03/14/2013  . 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 DM type 2 with diabetic peripheral neuropathy ([place]) 12/06/2012  . Dyslipidemia 07/21/2006   PCP:  Marianna Payment, MD Pharmacy:   [facility] [place] - [address] [place]  [address] Phone: [phone] Fax: [phone]     Social Determinants of Health (SDOH) Interventions    Readmission Risk Interventions No flowsheet data found.

## 2020-11-26 NOTE — Discharge Instructions (Signed)
Kristy [name],  It was a pleasure taking care of you at [facility] were admitted and treated for covid pnuemonia. We are discharging you home now that you are doing better. Please follow the following instructions.  1) Follow up with your PCP on Friday at 9:15 am.  2) Since your symptoms started on 1/22, you are cleared from isolation.   Take care,  Dr. Linwood Dibbles, MD, MPH

## 2020-11-26 NOTE — Progress Notes (Signed)
Patient was discharged home with home care by MD order; discharged instructions review and give to patient with care notes; IV DIC; skin intact;  RWheelchair and 3/1 bedside commode were delivered to patient's room; patient's daughter Alisa Graff was called and informed that her mom is D/C home; patient will be escorted to the car by nurse tech via wheelchair.

## 2020-11-26 NOTE — Telephone Encounter (Signed)
TOC per Dr. Coy Saunas, discharge 11/26/2020, appt 11/29/2020.

## 2020-11-26 NOTE — TOC Transition Note (Signed)
Transition of Care [facility]) - CM/SW Discharge Note   Patient Details  Name: Kristy [name] MRN: [id number] Date of Birth: 08/03/1956  Transition of Care [facility]) CM/SW Contact:  Carles Collet, RN Phone Number: 11/26/2020, 12:22 PM   Clinical Narrative:    [facility] referral accepted by [facility], RW and 3/1 to be delivered to unit prior to DC. Patient will need [place] PT OT  Order with face to face.     Final next level of care: [place] Barriers to Discharge: No Barriers Identified   Patient Goals and CMS Choice Patient states their goals for this hospitalization and ongoing recovery are:: Return home CMS Medicare.gov Compare Post Acute Care list provided to:: Patient Choice offered to / list presented to : Patient  Discharge Placement                       Discharge Plan and Services In-house Referral: Clinical Social Work Discharge Planning Services: CM Consult Post Acute Care Choice: Home Health,Durable Medical Equipment          DME Arranged: 3-N-1,Walker rolling DME Agency: [employer] Date DME Agency Contacted: 11/26/20 Time DME Agency Contacted: 1220 Representative spoke with at DME Agency: [place]: PT,OT [place] Date [place]: 11/26/20 Time [place]: 1220 Representative spoke with at [place]: Amy  Social Determinants of Health ([place]) Interventions     Readmission Risk Interventions No flowsheet data found.

## 2020-11-26 NOTE — Progress Notes (Incomplete)
HD#2 Subjective:  Overnight Events: ***   Objective:  Vital signs in last 24 hours: Vitals:   11/25/20 1633 11/25/20 2013 11/25/20 2100 11/26/20 0415  BP: 122/84 133/69  110/79  Pulse: 100 (!) 101  99  Resp: 19 19  19   Temp: 98.2 F (36.8 C) 98 F (36.7 C)  97.9 F (36.6 C)  TempSrc: Axillary Oral  Axillary  SpO2: 93% 90% 91% 91%  Weight:      Height:       Supplemental O2: Room Air SpO2: 91 %   Physical Exam:   General: Pleasant, well-appearing *** laying in bed. No acute distress. Head: Normocephalic. Atraumatic. CV: RRR. No m/r/g. No LE edema Pulmonary: Lungs CTAB. Normal effort. No wheezing or rales. Abdominal: Soft, nontender, nondistended. Normal bowel sounds. Extremities: Palpable dorsalis pedis and radialis pulses. Normal ROM. Skin: Warm and dry. No obvious rash or lesions. Neuro: A&Ox3. Moves all extremities. Normal sensation. No focal deficit. Psych: Normal mood and affect  Filed Weights   11/24/20 1232  Weight: 97 kg     Intake/Output Summary (Last 24 hours) at 11/26/2020 0543 Last data filed at 11/25/2020 1800 Gross per 24 hour  Intake -  Output 1000 ml  Net -1000 ml   Net IO Since Admission: -473.9 mL [11/26/20 0543]  Recent Labs    11/25/20 1244 11/25/20 1645 11/25/20 2155  GLUCAP 205* 117* 149*     Pertinent Labs: CBC Latest Ref Rng & Units 11/26/2020 11/25/2020 11/24/2020  WBC 4.0 - 10.5 K/uL 7.0 6.5 6.8  Hemoglobin 12.0 - 15.0 g/dL 12.6 13.0 13.5  Hematocrit 36.0 - 46.0 % 38.4 41.4 41.7  Platelets 150 - 400 K/uL 241 254 223    CMP Latest Ref Rng & Units 11/26/2020 11/25/2020 11/24/2020  Glucose 70 - 99 mg/dL 142(H) 177(H) 98  BUN 8 - 23 mg/dL 46(H) 58(H) 75(H)  Creatinine 0.44 - 1.00 mg/dL 2.03(H) 2.36(H) 2.82(H)  Sodium 135 - 145 mmol/L 139 141 141  Potassium 3.5 - 5.1 mmol/L 4.7 4.9 5.2(H)  Chloride 98 - 111 mmol/L 111 114(H) 113(H)  CO2 22 - 32 mmol/L 18(L) 18(L) 16(L)  Calcium 8.9 - 10.3 mg/dL 8.7(L) 8.6(L) 8.7(L)  Total  Protein 6.5 - 8.1 g/dL 5.9(L) 5.9(L) 6.2(L)  Total Bilirubin 0.3 - 1.2 mg/dL 1.0 0.8 1.1  Alkaline Phos 38 - 126 U/L 52 63 59  AST 15 - 41 U/L 40 53(H) 50(H)  ALT 0 - 44 U/L 33 38 33    Imaging: NM Pulmonary Perf and Vent  Result Date: 11/25/2020 CLINICAL DATA:  COVID pneumonia, elevated D-dimer EXAM: NUCLEAR MEDICINE PERFUSION LUNG SCAN TECHNIQUE: Perfusion images were obtained in multiple projections after intravenous injection of radiopharmaceutical. Ventilation scans intentionally deferred if perfusion scan and chest x-ray adequate for interpretation during COVID 19 epidemic. RADIOPHARMACEUTICALS:  4.2 mCi Tc-38m MAA IV COMPARISON:  Chest radiograph dated 11/25/2020 FINDINGS: On perfusion imaging, there are no wedge-shaped perfusion defects to suggest pulmonary embolism. Mildly heterogeneous perfusion along the lateral right lower lung, corresponding to the patient's known pneumonia. Ventilation imaging was deferred. Chest radiograph are notable for multifocal pneumonia in this patient with known COVID. IMPRESSION: No evidence of pulmonary embolism. Electronically Signed   By: Julian Hy M.D.   On: 11/25/2020 14:21   DG Chest Port 1 View  Result Date: 11/25/2020 CLINICAL DATA:  Follow-up COVID pneumonia. EXAM: PORTABLE CHEST 1 VIEW COMPARISON:  11/23/2018 FINDINGS: Heart size is mildly enlarged. No pleural effusion or edema. Interval progression of  bilateral interstitial and airspace densities when compared with the previous exam. The visualized osseous structures are unremarkable. IMPRESSION: Interval progression of bilateral interstitial and airspace densities compatible with multifocal infection. Electronically Signed   By: Kerby Moors M.D.   On: 11/25/2020 13:46    Assessment/Plan:   Active Problems:   AKI (acute kidney injury) ([place])   Pneumonia due to COVID-19 virus  Patient Summary: Kristy [name] is a 65 y.o. 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 Also has an AKI, likely 2/2 poor PO intake.  COVID-19 pneumonia Not vaccinated. Sats 88-92% on room air. Weakness improving. Inflammatory markers up and down, moderately elevated   -cont. Trending inflammatory markers.  -supplemental O2 for oxygen <88%  -robitussin prn  -PT/OT consulted   Suspected PE Increased shortness of breath yesterday with S1Q3 on ecg, ongoing tachycardia with fluid resuscitation  - v/q scan pending required cxr within 24 hours of scan  - cont. therepeutic heparin for now  - hgb stable  - b/l LE doppler negative for DVT   AKI on CKD 4 Hyperkalemia - resolved  Creatinine continuing to improve with fluids. Hyperkalemia resolved. UA does show nitrites, hemoglobin, small leuks, and rare bacteria. She is asymptomatic.  -continue mIVF at 100cc/hr for 12 hours.  -Avoid nephrotoxins -repeat UA prior to discharge with hemoglobin - UA taken prior to starting therapeutic heparin -Strict intake and output -Daily BMP  DM type II with peripheral neuropathy Last A1c of 7.4 on 10/14/20. Home meds of Insulin Degludec-Liraglutide (Xutolphy) 40 units daily. -cont. 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 Unclear if she is taking hyzaar or spironolactone, -continue to hold, normotensive   HLD Home med of atorvastatin 40mg  daily and ASA 81mg . -Resume home aspirin  OSA Does not use CPAP at home because the mask does not fit, has been referred to sleep medicine for mask fitting. -CPAP nightly   Diet: CM IVF: LR VTE: enoxaparin (LOVENOX) injection 30 mg Start: 11/25/20 2100 Code: Full PT/OT: SNF for Subacute PT ID:  Anti-infectives (From admission, onward)   Start     Dose/Rate  Route Frequency Ordered Stop   11/24/20 1000  remdesivir 100 mg in sodium chloride 0.9 % 100 mL IVPB  Status:  Discontinued       "Followed by" Linked Group Details   100 mg 200 mL/hr over 30 Minutes Intravenous Daily 11/23/20 1641 11/23/20 1737   11/23/20 1645  remdesivir 200 mg in sodium chloride 0.9% 250 mL IVPB  Status:  Discontinued       "Followed by" Linked Group Details   200 mg 580 mL/hr over 30 Minutes Intravenous Once 11/23/20 1641 11/23/20 1737       Anticipated discharge to {Discharge Destination:18313::"Home"} {NAMES:[id number]::"today","tomorrow","in *** days"} pending ***.  Lacinda Axon, MD 11/26/2020, 5:43 AM Pager: [facility]  Please contact the on call pager after 5 pm and on weekends at [phone].

## 2020-11-26 NOTE — Evaluation (Signed)
Occupational Therapy Evaluation Patient Details Name: Kristy [name] MRN: [id number] DOB: November 14, 1955 Today's Date: 11/26/2020    History of Present Illness 65 y.o. female with hx of CKD 4, insulin dependent diabetes, renovascular HTN due to fibromuscular dysplasia, HLD, OSA presenting for generalized weakness. In ED found to be COVID +, CXR with PNA, but no O2 requirement. Admitted 11/23/20 for treatment of COVID and AKI, Pt D-dimer increased and placed on heparin for suspected PE, Dopplers negative for DVT   Clinical Impression   This 65 y/o female presents with the above. PTA pt reports independence with ADL, iADL and functional mobility. Today pt presenting with notable weakness and decreased mobility status. Pt requiring up to modA bed mobility, maxA for sit<>stand attempts and for lateral/scoot along EOB. She requires minguard assist or seated UB ADL and up to [place] for LB ADL. VSS on RA throughout. Pt to benefit from continued acute OT services, given current status recommend SNF level therapies at time of discharge to progress pt towards her PLOF.     Follow Up Recommendations  SNF;Supervision/Assistance - 24 hour    Equipment Recommendations  3 in 1 bedside commode;Wheelchair (measurements OT);Wheelchair cushion (measurements OT)           Precautions / Restrictions Precautions Precautions: Fall Restrictions Weight Bearing Restrictions: No      Mobility Bed Mobility Overal bed mobility: Needs Assistance Bed Mobility: Supine to Sit;Sit to Supine     Supine to sit: Min assist Sit to supine: Mod assist   General bed mobility comments: improved ability to egress herself towards EOB with light assist for trunk elevation. assist for LEs back onto EOB    Transfers Overall transfer level: Needs assistance Equipment used: 1 person hand held assist Transfers: Sit to/from Stand;Lateral/Scoot Transfers Sit to Stand: Max assist        Lateral/Scoot Transfers: Max  assist General transfer comment: heavy boosting assist to rise to standing, completed x3, pt only able to maintain upright position for approx 5 seconds (notable LE weakness esp in RLE). use of lateral/scoot to move towards [facility] prior to return to supine    Balance Overall balance assessment: Needs assistance Sitting-balance support: Feet supported;No upper extremity supported;Single extremity supported;Bilateral upper extremity supported Sitting balance-Leahy Scale: Fair       Standing balance-Leahy Scale: Poor Standing balance comment: heavy reliance on external assist                           ADL either performed or assessed with clinical judgement   ADL Overall ADL's : Needs assistance/impaired Eating/Feeding: Set up;Sitting Eating/Feeding Details (indicate cue type and reason): to open containers Grooming: Set up;Supervision/safety;Sitting   Upper Body Bathing: Min guard;Sitting   Lower Body Bathing: Maximal assistance;Sitting/lateral leans   Upper Body Dressing : Min guard;Sitting   Lower Body Dressing: Total assistance;Sitting/lateral leans;Sit to/from stand Lower Body Dressing Details (indicate cue type and reason): assist for socks     Toileting- Clothing Manipulation and Hygiene: Total assistance;Sitting/lateral lean;Bed level       Functional mobility during ADLs: Maximal assistance (sit<>Stand and lateral/scoot) General ADL Comments: pt with notable weakness and decreased mobility status                         Pertinent Vitals/Pain Pain Assessment: Faces Faces Pain Scale: Hurts a little bit Pain Location: joints with mobilization Pain Descriptors / Indicators: Grimacing;Guarding;Moaning Pain Intervention(s): Monitored during session;Repositioned  Hand Dominance     Extremity/Trunk Assessment Upper Extremity Assessment Upper Extremity Assessment: Generalized weakness   Lower Extremity Assessment Lower Extremity Assessment: Defer  to PT evaluation       Communication Communication Communication: No difficulties   Cognition Arousal/Alertness: Awake/alert Behavior During Therapy: WFL for tasks assessed/performed Overall Cognitive Status: Within Functional Limits for tasks assessed                                     General Comments  VSS on RA    Exercises     Shoulder Instructions      Home Living Family/patient expects to be discharged to:: Private residence Living Arrangements: Spouse/significant other Available Help at Discharge: Family;Available 24 hours/day (husband is retired, son and 22 yo granddaughter also there) Type of Home: House Home Access: Stairs to enter Technical brewer of Steps: 5 Entrance Stairs-Rails: [place]: One level     Bathroom Shower/Tub: Teacher, early years/pre: Standard Bathroom Accessibility: Yes   Home Equipment: None          Prior Functioning/Environment Level of Independence: Independent        Comments: reports independence with community ambulation, driving,        OT Problem List: Decreased strength;Decreased range of motion;Decreased activity tolerance;Impaired balance (sitting and/or standing);Decreased knowledge of use of DME or AE;Obesity;Cardiopulmonary status limiting activity      OT Treatment/Interventions: Self-care/ADL training;Therapeutic exercise;Energy conservation;DME and/or AE instruction;Therapeutic activities;Patient/family education;Balance training    OT Goals(Current goals can be found in the care plan section) Acute Rehab OT Goals Patient Stated Goal: go shopping with her granddaughter OT Goal Formulation: With patient Time For Goal Achievement: 12/10/20 Potential to Achieve Goals: Good  OT Frequency: Min 2X/week   Barriers to D/C:            Co-evaluation              AM-PAC OT "6 Clicks" Daily Activity     Outcome Measure Help from another person eating meals?: A  Little Help from another person taking care of personal grooming?: A Little Help from another person toileting, which includes using toliet, bedpan, or urinal?: Total Help from another person bathing (including washing, rinsing, drying)?: A Lot Help from another person to put on and taking off regular upper body clothing?: A Little Help from another person to put on and taking off regular lower body clothing?: Total 6 Click Score: 13   End of Session Nurse Communication: Mobility status  Activity Tolerance: Patient tolerated treatment well Patient left: in bed;with call bell/phone within reach;with bed alarm set  OT Visit Diagnosis: Unsteadiness on feet (R26.81);Muscle weakness (generalized) (M62.81);Other abnormalities of gait and mobility (R26.89)                Time: 2423-5361 OT Time Calculation (min): 20 min Charges:  OT General Charges $OT Visit: 1 Visit OT Evaluation $OT Eval Moderate Complexity: [place], OT Acute Rehabilitation Services Pager [phone] Office [phone]   Raymondo Band 11/26/2020, 5:15 PM

## 2020-11-26 NOTE — Discharge Summary (Signed)
Name: Kristy [name] MRN: [id number] DOB: Dec 01, 1955 65 y.o. PCP: Marianna Payment, MD  Date of Admission: 11/23/2020  1:01 PM Date of Discharge:  11/26/2020 Attending Physician: Velna Ochs, MD  Discharge Diagnosis: 1. Covid-19 pneumonia 2. AKI  Discharge Medications: Allergies as of 11/26/2020   No Known Allergies     Medication List    STOP taking these medications   amLODipine-atorvastatin 5-40 MG tablet Commonly known as: CADUET   carvedilol 25 MG tablet Commonly known as: COREG   losartan-hydrochlorothiazide 100-25 MG tablet Commonly known as: HYZAAR   spironolactone 50 MG tablet Commonly known as: ALDACTONE     TAKE these medications   Accu-Chek FastClix Lancets Misc Check blood sugar two times a day What changed:   how much to take  how to take this  when to take this  additional instructions   Accu-Chek Guide test strip Generic drug: glucose blood Check blood sugar two times a day What changed:   how much to take  how to take this  when to take this  additional instructions   Accu-Chek Guide w/Device Kit 1 each by Does not apply route 2 (two) times daily.   aspirin 81 MG EC tablet Take 81 mg by mouth daily.   Fish Oil 1000 MG Caps Take 1,000 mg by mouth daily.   glucose 4 GM chewable tablet Chew 1 tablet by mouth as needed for low blood sugar.   Insulin Pen Needle 31G X 5 MM Misc Commonly known as: B-D UF III MINI PEN NEEDLES 1 Device by Does not apply route 3 (three) times daily before meals. Check daily 3X before meals   INSULIN SYRINGE .5CC/31GX5/16" 31G X 5/16" 0.5 ML Misc Inject 1 Dose into the skin daily. The patient is insulin requiring, ICD 10 code E11.42. Insulin 1 time daily   traMADol 50 MG tablet Commonly known as: ULTRAM TAKE 2 TABLETS BY MOUTH EVERY 6 HOURS AS NEEDED FOR SEVERE PAIN What changed: See the new instructions.   Turmeric 500 MG Caps Take 500 mg by mouth daily.   vitamin C 1000 MG tablet Take  1,000 mg by mouth daily.   Xultophy 100-3.6 UNIT-MG/ML Sopn Generic drug: Insulin Degludec-Liraglutide Inject 40 Units into the skin daily.            Durable Medical Equipment  (From admission, onward)         Start     Ordered   11/26/20 1222  For home use only DME Walker rolling  Once       Question Answer Comment  Walker: With 5 Inch Wheels   Patient needs a walker to treat with the following condition Weakness      11/26/20 1222   11/26/20 1222  For home use only DME 3 n 1  Once        11/26/20 1222          Disposition and follow-up:   [name] was discharged from [facility] in Stable condition.  At the hospital follow up visit please address:  1.  COVID-19 pneumonia. No oxygen requirement this admission. Unvaccinated. Recommend COVID 19 vaccination based on CDC guidelines.  2. AKI on CKD. Continued to improve throughout her hospitalization. Repeat at time of follow up.   3. Revascular hypertension 2/2 fibromuscular dysplasia Home medications: coreg 25mg  BID, losartan-hctz(hyzaar)100-25mg , spironolactone 50mg , amlodipine 5mg .  Discharge medications: home medications held as she was normotensive at time of discharge. Makes changes at your discretion.  4. OSA Patient does not use CPAP at home because the mask does not fit, has been referred to sleep medicine for mask fitting. Refused CPAP at night during hospitalization.   5. DM type II with peripheral neuropathy. Resume xutolphy at time of discharge. Check CBG and adjust as needed.  Labs / imaging needed at time of follow-up: BMP  Follow-up Appointments:  Follow-up Information    [facility] Follow up.   Specialty: Home Health Services Why: For home health services. they will call you in 1-2 days to set up your first home appointment Contact information: [place] [address] [phone]               Hospital Course by problem list: 1.  COVID-19 pneumonia. Pt presented with generalized 2 weeks of poor appetite, malaise and weakness. She did not receive the COVID vaccinations.  She did not receive remdesivir due to presenting outside the timeframe. She did not receive decadron as she did not require supplemental oxygen.  Due to persistent tachycardia, she underwent lower extremity duplexes and a VQ scan which did not reveal a PE.  AKI on CKD 4 secondary to dehydration. Renal function improved over the course of her hospitalization.  Subjective: Patient feeling better. She denies any SOB, CP, abdominal pain or leg pain. Ready for discharge home.  Discharge Exam:   BP 102/67 (BP Location: Left Arm)   Pulse 99   Temp 97.6 F (36.4 C) (Axillary)   Resp 20   Ht $R'5\' 3"'AY$  (1.6 m)   Wt 97 kg   LMP 01/05/2009   SpO2 92%   BMI 37.88 kg/m  General:Pleasant, well-appearing obese woman laying in bed. No acute distress. Head:Normocephalic. Atraumatic. CV: RRR.No m/r/g. No LE edema Pulmonary:Lungs CTAB. Normal effort. No wheezing or rales. Abdominal:Soft,nontender,nondistended. Normal bowel sounds. Extremities: Palpable dorsalis pedis and radialis pulses. Normal ROM. Skin:Warm and dry. No obvious rash or lesions. Neuro:A&Ox3.Moves all extremities. Normal sensation. No focal deficit. Psych: Normal mood and affect  Pertinent Labs, Studies, and Procedures:  CBC Latest Ref Rng & Units 11/26/2020 11/25/2020 11/24/2020  WBC 4.0 - 10.5 K/uL 7.0 6.5 6.8  Hemoglobin 12.0 - 15.0 g/dL 12.6 13.0 13.5  Hematocrit 36.0 - 46.0 % 38.4 41.4 41.7  Platelets 150 - 400 K/uL 241 254 223    Discharge Instructions:   Kristy [name],  It was a pleasure taking care of you at [place] [place] were admitted and treated for covid pnuemonia. We are discharging you home now that you are doing better. Please follow the following instructions.  1) Follow up with your PCP on Friday at 9:15 am.  2) Since your symptoms started on 1/22, you are  cleared from isolation.   Take care,  Dr. Linwood Dibbles, MD, MPH  Signed: Lacinda Axon, MD 11/26/2020, 12:26 PM   Pager: [phone] Internal Medicine Teaching Service

## 2020-11-27 NOTE — Telephone Encounter (Signed)
Transition Care Management Unsuccessful Follow-up Telephone Call  Date of discharge and from where: 11/26/20 [facility]  Attempts:  1st Attempt  Reason for unsuccessful TCM follow-up call:  No answer/busy

## 2020-11-28 ENCOUNTER — Telehealth: Payer: Self-pay

## 2020-11-28 LAB — CULTURE, BLOOD (ROUTINE X 2): Culture: NO GROWTH

## 2020-11-28 NOTE — Telephone Encounter (Signed)
Transition Care Management Follow-up Telephone Call  Date of discharge and from where: 11/26/20  How have you been since you were released from the hospital? Decreased appetite, but getting "perkier" and more talkative  Any questions or concerns? Yes "Patient needs to be reached on mobile number, not home number, daughter Ninfa [place]) is afraid PT will be unable to reach them.  RN provided daughter with [facility] phone number and instructed her to call.  RN also called [facility], spoke with Verline Lema, she states they never received a referral and asked if a referral could be refaxed/emailed.  Will forward this to red team and Lauren.  Items Reviewed:  Did the pt receive and understand the discharge instructions provided? Yes   Medications obtained and verified? Yes   Other? No   Any new allergies since your discharge? No   Dietary orders reviewed? Yes  Do you have support at home? Yes   Home Care and Equipment/Supplies: Were home health services ordered? yes If so, what is the name of the agency? [facility]  Has the agency set up a time to come to the patient's home? no Functional Questionnaire: (I = Independent and D = Dependent) ADLs: D  Bathing/Dressing- D  Meal Prep- D  Eating- I  Maintaining continence- I  Transferring/Ambulation- D  Managing Meds- D  Follow up appointments reviewed:   PCP Hospital f/u appt confirmed? Yes  Scheduled to see Dr. Myrtie Hawk on 11/29/20 @ 0915.  [facility] [facility] f/u appt confirmed? N/A  Are transportation arrangements needed? No   If their condition worsens, is the pt aware to call PCP or go to the Emergency Dept.? Yes  Was the patient provided with contact information for the PCP's office or ED? Yes  Was to pt encouraged to call back with questions or concerns? Yes

## 2020-11-28 NOTE — Telephone Encounter (Signed)
Referral to [facility] was done at discharge by in-patient team. Following note from Case Manager, Carles Collet, RN:  [place] Arranged: PT,OT [place] Date [place]: 11/26/20 Time [place]: 37 Representative spoke with at [place]: Amy  Call placed to Amy at [facility]. States she will review the notes and "get on top of this right now." L. Jamonica Schoff, BSN, RN-BC

## 2020-11-29 ENCOUNTER — Ambulatory Visit (INDEPENDENT_AMBULATORY_CARE_PROVIDER_SITE_OTHER): Payer: Medicare Other | Admitting: Internal Medicine

## 2020-11-29 ENCOUNTER — Other Ambulatory Visit: Payer: Self-pay | Admitting: Student

## 2020-11-29 ENCOUNTER — Other Ambulatory Visit: Payer: Self-pay

## 2020-11-29 ENCOUNTER — Other Ambulatory Visit: Payer: Self-pay | Admitting: *Deleted

## 2020-11-29 ENCOUNTER — Telehealth: Payer: Self-pay

## 2020-11-29 DIAGNOSIS — E1142 Type 2 diabetes mellitus with diabetic polyneuropathy: Secondary | ICD-10-CM

## 2020-11-29 DIAGNOSIS — Z09 Encounter for follow-up examination after completed treatment for conditions other than malignant neoplasm: Secondary | ICD-10-CM

## 2020-11-29 DIAGNOSIS — G4733 Obstructive sleep apnea (adult) (pediatric): Secondary | ICD-10-CM | POA: Diagnosis not present

## 2020-11-29 LAB — CULTURE, BLOOD (ROUTINE X 2): Culture: NO GROWTH

## 2020-11-29 MED ORDER — XULTOPHY 100-3.6 UNIT-MG/ML ~~LOC~~ SOPN: 40.0000 [IU] | PEN_INJECTOR | Freq: Every day | SUBCUTANEOUS | 5 refills | Status: DC

## 2020-11-29 MED ORDER — "INSULIN SYRINGE 31G X 5/16"" 0.5 ML MISC": 1.0000 | Freq: Every day | 3 refills | Status: DC

## 2020-11-29 NOTE — Telephone Encounter (Signed)
Patient's daughter called to report patient's BP today 126/96 left arm sitting. P 98  Also requesting refill on Xultophy and insulin syringes at [facility]. Hubbard Hartshorn, BSN, RN-BC

## 2020-11-29 NOTE — Progress Notes (Signed)
  [facility] Residency Telephone Encounter Continuity Care Appointment  HPI:   This telephone encounter was created for Ms. Lee on 12/01/2020 for the following purpose/cc hospital follow up. She was hospitalized for COVID-19 PNA, and found to have AKI on CKD as well. She reports feeling better.Her appetite is not completely back to normal but she has had some PO intake per her daughter. Last blood sugar numbers normal without hypo or hyperglycemia and has been 93 and 113. The daughter was not able to find the BP device to cher BP but patient denies dizziness or headache.  Please refer to assessment and plan of this note for more details. She has more energy since she came home from the hospital. can get up but can not walk around a lot yet because of leg soreness. No SOB.     Past Medical History:  Past Medical History:  Diagnosis Date  . Colon polyps   . Colon polyps   . Diabetes mellitus   . Diverticulosis   . Hyperlipidemia   . Hypertension   . 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 Albertine Patricia  . Sleep apnea 2007    status post polysomnogram 2007 , suggested use of CPAP  . Ventricular hypertrophy 2006    a 2-D echo in 2006, ejection fraction 55%, mild tricuspid regurgitation noted as well, 2-D echo November 13, 2705 showed diastolic dysfunction with LVEF normal of 65%     ROS:      Assessment / Plan / Recommendations:   COVID-19 infection: Patient was discharged from hospital yesterday. She is out of precaution window per discharge summery. Overally feels better. No SOB and has been at room air the entire hospitalization.  AKI on CKD during recent hospitalization: In person visit next in a week for BMP check.  HTN: Anti HTN meds held in  hospital due to being normotensive and also due to AKI on CKD. Could not check her BP at home. In person visit next week and will consider resumeing meds if appropriate. (Patieny's daughter will give [place] a call about BP number at home if can find a device to check it)  As always, pt is advised that if symptoms worsen or new symptoms arise, they should go to an urgent care facility or to to ER for further evaluation.   Consent and Medical Decision Making:   Patient discussed with Dr. Jimmye Norman.  This is a telephone encounter between [facility] and [place] on 12/01/2020 for hospital follow up. The visit was conducted with the patient located at home and [facility] at [facility]. The patient's identity was confirmed using their DOB and current address. The patient has consented to being evaluated through a telephone encounter and understands the associated risks (an examination cannot be done and the patient may need to come in for an appointment) / benefits (allows the patient to remain at home, decreasing exposure to coronavirus). I personally spent 13 minutes on medical discussion.    Dewayne Hatch, MD [id number] 12/01/2020, 7:06 AM Pager: [phone]

## 2020-11-29 NOTE — Telephone Encounter (Signed)
I let Ms. Wintle know her xultophy, sent from [employer], is available for pickup at [facility] anytime during open business hours. She expressed understanding.

## 2020-12-02 ENCOUNTER — Encounter: Payer: Self-pay | Admitting: *Deleted

## 2020-12-03 NOTE — Telephone Encounter (Signed)
Ok thank you. Please let me know if you need anything from [place] for that.

## 2020-12-04 NOTE — Progress Notes (Signed)
Internal Medicine Clinic Attending  Case discussed with Dr. Masoudi  At the time of the visit.  We reviewed the resident's history and exam and pertinent patient test results.  I agree with the assessment, diagnosis, and plan of care documented in the resident's note.  

## 2020-12-05 ENCOUNTER — Encounter: Payer: Medicare Other | Admitting: Student

## 2020-12-05 NOTE — Telephone Encounter (Signed)
TC received from patient's daughter, Ninfa Linden, regarding Xultophy.  She has been trying to pick this up from [facility] outpatient pharmacy.  Patient was on the phone and doesn't understand why they are unable to pick the Xultophy up.  [place] or [place], I do not see any medication in the med room for Ms. Beyl.  Where can we find the Xultophy? Thanks, SChaplin, RN,BSN

## 2020-12-05 NOTE — Telephone Encounter (Signed)
Just called back & informed her & her daughter that the meds are here [facility]). They are in the back of the fridge near the bottom, 4 boxes bagged up. Not sure when they are coming but they are aware they need to tell the front they are picking up their patient assistance med xultophy, and to ask for Rosendo Gros if they are unsure.

## 2020-12-09 ENCOUNTER — Other Ambulatory Visit: Payer: Self-pay | Admitting: Student

## 2020-12-09 ENCOUNTER — Ambulatory Visit (INDEPENDENT_AMBULATORY_CARE_PROVIDER_SITE_OTHER): Payer: Medicare HMO | Admitting: Student

## 2020-12-09 ENCOUNTER — Encounter: Payer: Self-pay | Admitting: Student

## 2020-12-09 VITALS — BP 139/89 | HR 103 | Temp 97.7°F | Wt 202.0 lb

## 2020-12-09 DIAGNOSIS — E785 Hyperlipidemia, unspecified: Secondary | ICD-10-CM | POA: Diagnosis not present

## 2020-12-09 DIAGNOSIS — E1142 Type 2 diabetes mellitus with diabetic polyneuropathy: Secondary | ICD-10-CM

## 2020-12-09 DIAGNOSIS — I150 Renovascular hypertension: Secondary | ICD-10-CM | POA: Diagnosis not present

## 2020-12-09 DIAGNOSIS — N184 Chronic kidney disease, stage 4 (severe): Secondary | ICD-10-CM

## 2020-12-09 MED ORDER — LOSARTAN POTASSIUM-HCTZ 50-12.5 MG PO TABS: 1.0000 | ORAL_TABLET | Freq: Every day | ORAL | 3 refills | Status: DC

## 2020-12-09 MED ORDER — ATORVASTATIN CALCIUM 40 MG PO TABS: 40.0000 mg | ORAL_TABLET | Freq: Every day | ORAL | 3 refills | Status: DC

## 2020-12-09 MED FILL — ATORVASTATIN 40 MG TABLET: 40 | 90 days supply | Qty: 90 | Fill #0

## 2020-12-09 MED FILL — LOSARTAN-HCTZ 50-12.5 MG TA: 50-12.5 | 30 days supply | Qty: 30 | Fill #0

## 2020-12-09 NOTE — Assessment & Plan Note (Signed)
Patient presents for follow-up of recent hospitalization for COVID-19 infection. During hospitalization, patient had five antihypertensive regimen held (carvedilol 25 BID, losartan-HCTZ 100-25, spironolactone 50, and amlodipine 5mg ) due to being normotensive with an acute kidney injury. Patient denies chest pain, headaches, lightheadedness, dizziness, blurry vision, shortness of breath.  Blood pressure today 139/89, HR 103  Patient would benefit from slow reintroduction of antihypertensive regimen with close follow-up. At this time, patient may benefit from starting with losartan-HCTZ at half dose of prior prescription. As patient is noted to be tachycardic today, may benefit at next visit by addition of carvedilol at lower dose. -Start losartan-HCTZ 50-12.5mg  daily -BMP today -Consider reintroducing additional agents versus uptitration of losartan-HCTZ at next visit

## 2020-12-09 NOTE — Progress Notes (Signed)
   CC: Hospital follow-up (HTN, T2DM, CKD4, HLD)  HPI:  [name] is a 65 y.o. with past medical history of T2DM, HTN, CKD4, HLD who presents to clinic for hospital follow-up. Refer to problem list for charting of this encounter.  Past Medical History:  Diagnosis Date  . Colon polyps   . Colon polyps   . Diabetes mellitus   . Diverticulosis   . Hyperlipidemia   . Hypertension   . 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 Albertine Patricia  . Sleep apnea 2007    status post polysomnogram 2007 , suggested use of CPAP  . Ventricular hypertrophy 2006    a 2-D echo in 2006, ejection fraction 55%, mild tricuspid regurgitation noted as well, 2-D echo November 14, 4663 showed diastolic dysfunction with LVEF normal of 65%   Review of Systems:  Denies chest pain, shortness of breath, headaches, lightheadedness, dizziness, tremors, diaphoresis, falls.  Physical Exam:  Vitals:   12/09/20 1412  BP: 139/89  Pulse: (!) 103  Temp: 97.7 F (36.5 C)  TempSrc: Oral  SpO2: 97%  Weight: 202 lb (91.6 kg)   Physical Exam Constitutional:      General: She is not in acute distress.    Appearance: Normal appearance. She is obese. She is not ill-appearing.  Cardiovascular:     Rate and Rhythm: Normal rate and regular rhythm.     Pulses: Normal pulses.     Heart sounds: Normal heart sounds. No murmur heard.   Pulmonary:     Effort: Pulmonary effort is normal.     Breath sounds: Normal breath sounds.  Abdominal:     General: Abdomen is flat. Bowel sounds are normal.     Palpations: Abdomen is soft.     Tenderness: There is no abdominal tenderness.  Musculoskeletal:        General: Normal range of motion.     Right lower leg: No edema.     Left lower leg: No edema.  Skin:     General: Skin is warm and dry.     Capillary Refill: Capillary refill takes less than 2 seconds.  Neurological:     General: No focal deficit present.     Mental Status: She is alert. Mental status is at baseline.  Psychiatric:        Mood and Affect: Mood normal.        Behavior: Behavior normal.      Assessment & Plan:   See Encounters Tab for problem based charting.  Patient discussed with Dr. Daryll Drown

## 2020-12-09 NOTE — Patient Instructions (Signed)
Ms. Stegner,  It was a pleasure seeing you in clinic today.  For your hypertension (high blood pressure): I have sent a prescription for a medication called Hyzaar (Losartan-HCTZ 50-12.5mg ) which I would ask that you take every day. Continue holding the amlodipine, spironolactone, carvedilol, and higher dose of the losartan-HCTZ 100-25mg . We may need to restart more medication at a later date.  For your diabetes (high blood sugar): Continue taking the [facility] as you have been. Please don't hesitate to call us if you have any low blood glucose readings or symptoms of low blood sugars (dizziness, lightheadedness, sweats, tremors).  For your hyperlipidemia (cholesterol): I have sent a prescription for a medication called atorvastatin 40mg . Please pick up this medication and begin taking it every day.  Sincerely, Dr. Paulla Dolly, MD

## 2020-12-09 NOTE — Assessment & Plan Note (Signed)
Patient previously taking atorvastatin 40mg  daily with combination pill of amlodipine 5mg  daily. This medication was discontinued at hospitalization. -Restart atorvastatin 40mg  dialy

## 2020-12-09 NOTE — Assessment & Plan Note (Signed)
Patient reports checking her fasting blood glucose twice since discharge from hospital. Yesterday, patient's fasting blood glucose was 118, today fasting blood was glucose was 69. Patient denies any episodes of lightheadedness, dizziness, tremors, diaphoresis or other symptoms concerning for hypoglycemia. Patient tolerating current regimen of insulin degludec-liraglutide 40 units daily.  Prior hemoglobin A1c was 7.4 in December 2021.  Patient would benefit from continuation of current medication regimen with close follow-up. Patient would benefit from increasing frequency of CBG monitoring. -Continue insulin degludec-liraglutide 40 units daily

## 2020-12-10 LAB — BMP8+ANION GAP
Anion Gap: 14 mmol/L (ref 10.0–18.0)
BUN/Creatinine Ratio: 15 (ref 12–28)
BUN: 27 mg/dL (ref 8–27)
CO2: 19 mmol/L — ABNORMAL LOW (ref 20–29)
Calcium: 9.9 mg/dL (ref 8.7–10.3)
Chloride: 106 mmol/L (ref 96–106)
Creatinine, Ser: 1.83 mg/dL — ABNORMAL HIGH (ref 0.57–1.00)
GFR calc Af Amer: 33 mL/min/{1.73_m2} — ABNORMAL LOW (ref >59)
GFR calc non Af Amer: 29 mL/min/{1.73_m2} — ABNORMAL LOW (ref >59)
Glucose: 112 mg/dL — ABNORMAL HIGH (ref 65–99)
Potassium: 5 mmol/L (ref 3.5–5.2)
Sodium: 139 mmol/L (ref 134–144)

## 2020-12-17 ENCOUNTER — Other Ambulatory Visit: Payer: Self-pay | Admitting: Internal Medicine

## 2020-12-17 DIAGNOSIS — F1190 Opioid use, unspecified, uncomplicated: Secondary | ICD-10-CM

## 2020-12-18 NOTE — Progress Notes (Signed)
Internal Medicine Clinic Attending  Case discussed with Dr. Johnson  At the time of the visit.  We reviewed the resident's history and exam and pertinent patient test results.  I agree with the assessment, diagnosis, and plan of care documented in the resident's note.  

## 2020-12-23 ENCOUNTER — Encounter: Payer: Self-pay | Admitting: Student

## 2020-12-23 ENCOUNTER — Other Ambulatory Visit: Payer: Self-pay

## 2020-12-23 ENCOUNTER — Ambulatory Visit (INDEPENDENT_AMBULATORY_CARE_PROVIDER_SITE_OTHER): Payer: Medicare HMO | Admitting: Student

## 2020-12-23 ENCOUNTER — Other Ambulatory Visit: Payer: Self-pay | Admitting: Student

## 2020-12-23 VITALS — BP 150/98 | HR 100 | Temp 98.1°F | Ht 63.0 in | Wt 214.0 lb

## 2020-12-23 DIAGNOSIS — Z0000 Encounter for general adult medical examination without abnormal findings: Secondary | ICD-10-CM

## 2020-12-23 DIAGNOSIS — I150 Renovascular hypertension: Secondary | ICD-10-CM | POA: Diagnosis not present

## 2020-12-23 MED ORDER — CARVEDILOL 12.5 MG PO TABS: 12.5000 mg | ORAL_TABLET | Freq: Two times a day (BID) | ORAL | 0 refills | Status: DC

## 2020-12-23 MED ORDER — LOSARTAN POTASSIUM-HCTZ 100-25 MG PO TABS: 1.0000 | ORAL_TABLET | Freq: Every day | ORAL | 0 refills | Status: DC

## 2020-12-23 MED FILL — LOSARTAN-HCTZ 100-25 MG TAB: 100-25 | 30 days supply | Qty: 30 | Fill #0

## 2020-12-23 MED FILL — CARVEDILOL 12.5 MG TABLET: 12.5 | 30 days supply | Qty: 60 | Fill #0

## 2020-12-23 NOTE — Assessment & Plan Note (Signed)
Patient due for colonoscopy. -Referral to gastroenterology for screening colonoscopy

## 2020-12-23 NOTE — Patient Instructions (Addendum)
Kristy [name],  It was a pleasure seeing you in clinic today.  For your hypertension (high blood pressure), we request that you take the current regimen:  -Losartan-HCTZ 100-25mg  daily -Carvedilol 12.5mg  twice daily  I have sent prescriptions for both of these medications to the [facility]. We will need to continue to check your blood pressure regularly as we start to resume medications. We also strongly recommend reducing your salt intake.  Continue taking atorvastatin, insulin and tramadol for your other chronic conditions.  Sincerely, Dr. Paulla Dolly, MD

## 2020-12-23 NOTE — Progress Notes (Signed)
   CC: HTN follow-up  HPI:  [name] is a 65 y.o. female with past medical history significant for renal artery stenosis s/p angioplasty of left renal artery, OSA without CPAP, HTN, HLD, T2DM and recent hospitalization for COVID-19 infection who presents to clinic for follow-up HTN. Refer to problem list for charting of this encounter.  Past Medical History:  Diagnosis Date  . Colon polyps   . Colon polyps   . Diabetes mellitus   . Diverticulosis   . Hyperlipidemia   . Hypertension   . 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 Albertine Patricia  . Sleep apnea 2007    status post polysomnogram 2007 , suggested use of CPAP  . Ventricular hypertrophy 2006    a 2-D echo in 2006, ejection fraction 55%, mild tricuspid regurgitation noted as well, 2-D echo November 13, 9733 showed diastolic dysfunction with LVEF normal of 65%   Review of Systems:  Endorses lower extremity swelling. Denies headaches, shortness of breath, chest pain, palpitations, fevers, chills, cough.  Physical Exam:  Vitals:   12/23/20 1334  BP: (!) 150/98  Pulse: 100  Temp: 98.1 F (36.7 C)  TempSrc: Oral  SpO2: 100%  Weight: 214 lb (97.1 kg)  Height: 5\' 3"  (1.6 m)   Physical Exam Constitutional:      General: She is not in acute distress.    Appearance: She is obese. She is not ill-appearing.  Cardiovascular:     Rate and Rhythm: Regular rhythm. Tachycardia present.     Pulses: Normal pulses.     Heart sounds: Normal heart sounds.  Pulmonary:     Effort: Pulmonary effort is normal.     Breath sounds: Normal breath sounds.  Abdominal:     General: Abdomen is flat. Bowel sounds are normal.     Palpations: Abdomen is soft.     Tenderness: There is no abdominal tenderness.  Musculoskeletal:      Right lower leg: Edema present.     Left lower leg: Edema present.     Comments: 1+ pitting edema of the bilateral lower extremities to the knees  Neurological:     General: No focal deficit present.     Mental Status: Mental status is at baseline.  Psychiatric:        Mood and Affect: Mood normal.        Behavior: Behavior normal.      Assessment & Plan:   See Encounters Tab for problem based charting.  Patient discussed with Dr. Heber Rosepine

## 2020-12-23 NOTE — Assessment & Plan Note (Signed)
Patient previously on five antihypertensive regimen with normal blood pressure readings throughout hospitalization with holding of her regimen upon discharge. Since hospital follow-up visit, patient has been taking losartan-HCTZ 50-12.5mg  daily as instructed without adverse effects. Her blood pressure is more elevated at today's visit (150/98) than at her hospital follow-up two weeks ago. On examination, patient is tachycardic (100) with bilateral lower extremity edema (1+ to the knees). Patient would benefit from escalation of her current antihypertensive regimen with continued close follow-up. -Increase losartan-HCTZ to 100-25mg  daily -Start carvedilol at half of prior to hospitalization dose (12.5mg  twice daily), consider titrating up -Continue to hold spironolactone 50mg  daily, consider restarting -Continue to hold amlodipine 5mg  daily, consider restarting -Follow-up in 2 weeks for BP check and BMP

## 2020-12-27 NOTE — Progress Notes (Signed)
Internal Medicine Clinic Attending  Case discussed with Dr. Johnson  At the time of the visit.  We reviewed the resident's history and exam and pertinent patient test results.  I agree with the assessment, diagnosis, and plan of care documented in the resident's note.  

## 2020-12-30 ENCOUNTER — Ambulatory Visit (INDEPENDENT_AMBULATORY_CARE_PROVIDER_SITE_OTHER): Payer: Medicare HMO | Admitting: Neurology

## 2020-12-30 ENCOUNTER — Encounter: Payer: Self-pay | Admitting: Neurology

## 2020-12-30 VITALS — BP 178/114 | HR 102 | Ht 62.0 in | Wt 212.0 lb

## 2020-12-30 DIAGNOSIS — Z820 Family history of epilepsy and other diseases of the nervous system: Secondary | ICD-10-CM | POA: Diagnosis not present

## 2020-12-30 DIAGNOSIS — R351 Nocturia: Secondary | ICD-10-CM

## 2020-12-30 DIAGNOSIS — G4733 Obstructive sleep apnea (adult) (pediatric): Secondary | ICD-10-CM | POA: Diagnosis not present

## 2020-12-30 DIAGNOSIS — E669 Obesity, unspecified: Secondary | ICD-10-CM

## 2020-12-30 DIAGNOSIS — R030 Elevated blood-pressure reading, without diagnosis of hypertension: Secondary | ICD-10-CM

## 2020-12-30 NOTE — Patient Instructions (Addendum)

## 2020-12-30 NOTE — Progress Notes (Signed)
Subjective:    Patient ID: Kristy [name] is a 65 y.o. female.  HPI     Kristy Age, MD, PhD [facility] [address], [address] [place], [place] [address]  Dear Kristy [name] and Kristy [name],   I saw your patient, Kristy [name], upon your kind request, in my Sleep clinic today for initial consultation of her sleep disorder, in particular, evaluation of her prior diagnosis of obstructive sleep apnea.  The patient is unaccompanied today.  As you know, Kristy [name] is a 65 year old right-handed woman with an underlying medical history of hypertension, hyperlipidemia, diabetes, diverticulosis, renal artery stenosis, arthritis, status post right knee replacement, ventricular hypertrophy, colonic polyps and obesity, who was previously diagnosed with obstructive sleep apnea.  She had sleep testing in 2019.  I was able to review her baseline sleep study from 11/10/2017 which showed a sleep efficiency of 83.8%, sleep latency of 51.9 minutes, REM latency of 163.5 minutes.  Overall AHI was 31.7/h, O2 nadir 83%.  She had a subsequent CPAP titration study on 12/03/2017 during which time a optimal CPAP pressure of 13 cm was recommended.  She is currently not on CPAP therapy.  She reports that she tried it for a few months but could not tolerate it.  She reports particularly having difficulty with the mask, she had a full facemask and just could not tolerate anything on her face.  She had a prior diagnosis of obstructive sleep apnea even before that and could not tolerate CPAP at that time either.  She is wondering if there is other mask options available.  She would rather not seek a surgical treatment.  She has full dentures.  She does not keep a very set schedule for her sleep, she is on disability and does not keep a sleep schedule.  She typically wakes up around 5 or 6 when her husband gets ready for work.  She lives with her husband, 2 of her children and her 40 year old granddaughter.  She  has a total of 6 kids.  She quit smoking some 15 years ago and does not utilize alcohol, she drinks caffeine in the form of soda, generally limits herself to 1/day on average.  She watches TV in her bedroom and turns it off when she is ready to fall asleep.  They have 1 dog in the household.  One of her daughters has sleep apnea and 2 of her sisters have sleep apnea.  Of note, the patient reports that she has been getting CPAP related supplies including mask even recently although she has not had a machine and gave it back years ago.  She is advised to get in touch with her supplier and make sure that these items are returned since she does not need them.  They should not be billed through her insurance either since she no longer has a machine.  I reviewed your office note from 11/13/2020.  Her Epworth sleepiness score is 2 out of 24, fatigue severity score is 9 out of 63.   Her Past Medical History Is Significant For: Past Medical History:  Diagnosis Date  . Colon polyps   . Colon polyps   . Diabetes mellitus   . Diverticulosis   . Hyperlipidemia   . Hypertension   . 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 Samule [name]  . Sleep apnea 2007    status post polysomnogram 2007 , suggested use of CPAP  . Ventricular hypertrophy 2006    a 2-D echo in 2006, ejection fraction 55%, mild tricuspid regurgitation noted as well, 2-D echo November 13, 2008 showed diastolic dysfunction with LVEF normal of 65%    Her Past Surgical History Is Significant For: Past Surgical History:  Procedure Laterality Date  . BREAST BIOPSY Right 2016  . RENAL ARTERY STENT  2007    patient is status post renal artery stent placement May 2007 and that was done secondary to renal artery stenosis, fibromuscular dysplasia   . REPLACEMENT TOTAL KNEE  02/2011-Dr. Magnus [name]   S/P Right Total Knee Replacement May/2012    Her Family History Is Significant For: Family History  Problem Relation [name] of Onset  . Diabetes Mother   . Cancer Mother   . Diabetes Father   . Cancer Other        breast cancer died in her 47s  . Bipolar disorder Son   . Hypertension Daughter   . Stroke Daughter   . Alcohol abuse Son   . Drug abuse Son   . Breast cancer Cousin   . Hypertension Sister   . Hypertension Sister   . Hypertension Daughter   . Hypertension Daughter   . Hypertension Daughter     Her Social History Is Significant For: Social History   Socioeconomic History  . Marital status: Married    Spouse name: Not on file  . Number of children: Not on file  . Years of education: 61  . Highest education level: Not on file  Occupational History  . Not on file  Tobacco Use  . Smoking status: Former Smoker    Packs/day: 1.00    Years: 25.00    Pack years: 25.00    Quit date: 10/26/2005    Years since quitting: 15.1  . Smokeless tobacco: Never Used  Substance and Sexual Activity  . Alcohol use: No    Alcohol/week: 0.0 standard drinks  . Drug use: No  . Sexual activity: Not on file  Other Topics Concern  . Not on file  Social History Narrative   Current Social History 11/14/2020        Patient lives with spouse in a home which is 1 story. There are steps up to the entrance with a handrail which the patient uses.       Patient's method of transportation is personal car.      The highest level of education was some high school, 11th grade.      The patient currently disabled.      Identified important Relationships are "my husband"       Pets : a dog, Kristy [name]: "Nothing since Covid"       Current Stressors: None       Religious / Personal Beliefs: Non-Christian       Other: None    Social Determinants of Health   Financial Resource Strain: Not on file  Food Insecurity: Not  on file  Transportation Needs: Not on file  Physical Activity: Not on file  Stress: Not on file  Social Connections: Not on file    Her Allergies Are:  No Known Allergies:   Her Current Medications Are:  Outpatient Encounter Medications as of 12/30/2020  Medication Sig  . Accu-Chek FastClix Lancets MISC Check blood sugar  two times a day (Patient taking differently: 1 each by Other route in the morning and at bedtime.)  . Ascorbic Acid (VITAMIN C) 1000 MG tablet Take 1,000 mg by mouth daily.  Marland Kitchen aspirin 81 MG EC tablet Take 81 mg by mouth daily.  Marland Kitchen atorvastatin (LIPITOR) 40 MG tablet Take 1 tablet (40 mg total) by mouth daily.  . Blood Glucose Monitoring Suppl (ACCU-CHEK GUIDE) w/Device KIT 1 each by Does not apply route 2 (two) times daily.  . carvedilol (COREG) 12.5 MG tablet Take 1 tablet (12.5 mg total) by mouth 2 (two) times daily.  Marland Kitchen glucose 4 GM chewable tablet Chew 1 tablet by mouth as needed for low blood sugar.  Marland Kitchen glucose blood (ACCU-CHEK GUIDE) test strip Check blood sugar two times a day (Patient taking differently: 1 each by Other route in the morning and at bedtime.)  . Insulin Degludec-Liraglutide (XULTOPHY) 100-3.6 UNIT-MG/ML SOPN Inject 40 Units into the skin daily.  . Insulin Pen Needle (B-D UF III MINI PEN NEEDLES) 31G X 5 MM MISC 1 Device by Does not apply route 3 (three) times daily before meals. Check daily 3X before meals  . Insulin Syringe-Needle U-100 (INSULIN SYRINGE .5CC/31GX5/16") 31G X 5/16" 0.5 ML MISC Inject 1 Dose into the skin daily. The patient is insulin requiring, ICD 10 code E11.42. Insulin 1 time daily  . losartan-hydrochlorothiazide (HYZAAR) 100-25 MG tablet Take 1 tablet by mouth daily.  . Omega-3 Fatty Acids (FISH OIL) 1000 MG CAPS Take 1,000 mg by mouth daily.  . traMADol (ULTRAM) 50 MG tablet TAKE 2 TABLETS BY MOUTH EVERY 6 HOURS AS NEEDED FOR SEVERE PAIN  . Turmeric 500 MG CAPS Take 500 mg by mouth daily.   No facility-administered encounter  medications on file as of 12/30/2020.  :  Review of Systems:  Out of a complete 14 point review of systems, all are reviewed and negative with the exception of these symptoms as listed below: Review of Systems  Neurological:       Here for sleep consult. Prior sleep studies, CPAP was tried for a few months but could not tolerate and d/c. Here reassess.  Epworth Sleepiness Scale 0= would never doze 1= slight chance of dozing 2= moderate chance of dozing 3= high chance of dozing  Sitting and reading:0 Watching TV:0 Sitting inactive in a public place (ex. Theater or meeting):0 As a passenger in a car for an hour without a break:0 Lying down to rest in the afternoon:2 Sitting and talking to someone:0 Sitting quietly after lunch (no alcohol):0 In a car, while stopped in traffic:0 Total:2      Objective:  Neurological Exam  Physical Exam  Physical Examination:   Vitals:   12/30/20 1112  BP: (!) 178/114  Pulse: (!) 102    General Examination: The patient is a very pleasant 65 y.o. female in no acute distress. She appears well-developed and well-nourished and well groomed.   HEENT: Normocephalic, atraumatic, pupils are equal, round and reactive to light, extraocular tracking is good without limitation to gaze excursion or nystagmus noted. Hearing is grossly intact. Face is symmetric with normal facial animation. Speech is clear with no dysarthria noted. There is no hypophonia. There is no lip, neck/head, jaw or voice tremor. Neck is supple with full range of passive and active motion. There are no carotid bruits on auscultation. Oropharynx exam reveals: moderate mouth dryness, adequate dental hygiene with full dentures and moderate airway crowding noted secondary to redundant soft palate, small airway entry,  tonsillar size of 1+, Mallampati class III.  Neck circumference is 16-3/8 inches.  Tongue protrudes centrally and palate elevates symmetrically.   Chest: Clear to auscultation  without wheezing, rhonchi or crackles noted.  Heart: S1+S2+0, regular and normal without murmurs, rubs or gallops noted.   Abdomen: Soft, non-tender and non-distended with normal bowel sounds appreciated on auscultation.  Extremities: There is no pitting edema in the distal lower extremities bilaterally.   Skin: Warm and dry without trophic changes noted.   Musculoskeletal: exam reveals discomfort in the right knee.     Neurologically:  Mental status: The patient is awake, alert and oriented in all 4 spheres. Her immediate and remote memory, attention, language skills and fund of knowledge are appropriate. There is no evidence of aphasia, agnosia, apraxia or anomia. Speech is clear with normal prosody and enunciation. Thought process is linear. Mood is normal and affect is normal.  Cranial nerves II - XII are as described above under HEENT exam.  Motor exam: Normal bulk, strength and tone is noted. There is no tremor. Fine motor skills and coordination: grossly intact.  Cerebellar testing: No dysmetria or intention tremor. There is no truncal or gait ataxia.  Sensory exam: intact to light touch in the upper and lower extremities.  Gait, station and balance: She walks with a cane.    Assessment and Plan:   In summary, Kristy [name] is a very pleasant 65 y.o.-year old female with an underlying medical history of hypertension, hyperlipidemia, diabetes, diverticulosis, renal artery stenosis, arthritis, status post right knee replacement, ventricular hypertrophy, colonic polyps and obesity, who presents for evaluation of her obstructive sleep apnea.  She was diagnosed several years ago originally.  She had reevaluation with sleep testing with some 3 years ago but could not tolerate CPAP at the time.  She would be willing to get retested and consider positive airway pressure again.   I had a long chat with the patient about my findings and the diagnosis of OSA, its prognosis and treatment options.  We talked about medical treatments, surgical interventions and non-pharmacological approaches. I explained in particular the risks and ramifications of untreated moderate to severe OSA, especially with respect to developing cardiovascular disease down the Road, including congestive heart failure, difficult to treat hypertension, cardiac arrhythmias, or stroke. Even type 2 diabetes has, in part, been linked to untreated OSA. Symptoms of untreated OSA include daytime sleepiness, memory problems, mood irritability and mood disorder such as depression and anxiety, lack of energy, as well as recurrent headaches, especially morning headaches. We talked about trying to maintain a healthy lifestyle in general, as well as the importance of weight control. We also talked about the importance of good sleep hygiene. I recommended the following at this time: sleep study.  I explained the sleep test procedure to the patient and also outlined possible surgical and non-surgical treatment options of OSA, including the use of a custom-made dental device (which would require a referral to a specialist dentist or oral surgeon), upper airway surgical options, such as traditional UPPP or a novel less invasive surgical option in the form of Inspire hypoglossal nerve stimulation (which would involve a referral to an ENT surgeon). I also explained the CPAP treatment option to the patient, who indicated that she would be willing to try CPAP if the need arises. I explained the importance of being compliant with PAP treatment, not only for insurance purposes but primarily to improve Her symptoms, and for the patient's long term health benefit,  including to reduce Her cardiovascular risks. I answered all her questions today and the patient was in agreement. I plan to see her back after the sleep study is completed and encouraged her to call with any interim questions, concerns, problems or updates.   Thank you very much for allowing me to  participate in the care of this nice patient. If I can be of any further assistance to you please do not hesitate to call me at [phone].  Sincerely,   Kristy Age, MD, PhD

## 2020-12-31 ENCOUNTER — Telehealth: Payer: Self-pay

## 2020-12-31 NOTE — Telephone Encounter (Signed)
LVM for pt to call me back to schedule sleep study  

## 2021-01-06 ENCOUNTER — Encounter: Payer: Self-pay | Admitting: *Deleted

## 2021-01-06 ENCOUNTER — Ambulatory Visit (INDEPENDENT_AMBULATORY_CARE_PROVIDER_SITE_OTHER): Payer: Medicare HMO | Admitting: Internal Medicine

## 2021-01-06 ENCOUNTER — Encounter: Payer: Self-pay | Admitting: Internal Medicine

## 2021-01-06 VITALS — BP 159/87 | HR 89 | Temp 98.0°F | Ht 62.0 in | Wt 214.4 lb

## 2021-01-06 DIAGNOSIS — E1142 Type 2 diabetes mellitus with diabetic polyneuropathy: Secondary | ICD-10-CM

## 2021-01-06 DIAGNOSIS — I150 Renovascular hypertension: Secondary | ICD-10-CM

## 2021-01-06 DIAGNOSIS — Z794 Long term (current) use of insulin: Secondary | ICD-10-CM | POA: Diagnosis not present

## 2021-01-06 LAB — POCT GLYCOSYLATED HEMOGLOBIN (HGB A1C): Hemoglobin A1C: 6.4 % — AB (ref 4.0–5.6)

## 2021-01-06 LAB — GLUCOSE, CAPILLARY: Glucose-Capillary: 93 mg/dL (ref 70–99)

## 2021-01-06 NOTE — Assessment & Plan Note (Signed)
BP Readings from Last 3 Encounters:  01/06/21 (!) 159/87  12/30/20 (!) 178/114  12/23/20 (!) 150/98   During last visit her losartan-HCTZ was increased from 50-12.5mg  qd to 100-25 mg, and she was started on carvedilol 12.5 Mg bid. Prior to this she was on spiro 50 mg qd and norvacs 5mg , which were stopped at a recent hospitalization due to normotension.  Today bp is elevated. She follows with nephrology for her renal artery stenosis and will be seeing them later this month. She is slightly frustrated as different physicians are always changing her blood pressure medications. She has been contacted for sleep study, and it is planned for 3/28.   - continue losartan-HCTZ 50-12.5 mg qd and coreg 12.5 mg bid - discussed with patient and will minimize changes to her medications and she will follow-up with nephrology for blood pressure changes  - f/u one month for bp check, with f/u nephrology recs  - last K 5.0, repeat bmp today

## 2021-01-06 NOTE — Assessment & Plan Note (Addendum)
Last a1c 7.4 three months ago. Today 6.4. Current medications include Xultophy 40U qd.   - continue xultophy 40U qd.  - f/u a1 check in 6 months

## 2021-01-06 NOTE — Patient Instructions (Signed)
Thank you for allowing [place] to provide your care today. Today we discussed your hypertension and type II diabetes   I have ordered the following labs for you:  Basic metabolic panel   I will call if any are abnormal.    Today we made the following changes to your medications:   Please follow-up in one month for blood pressure check and pap smear.    Please call the internal medicine center clinic if you have any questions or concerns, we may be able to help and keep you from a long and expensive emergency room wait. Our clinic and after hours phone number is [phone], the best time to call is Monday through Friday 9 am to 4 pm but there is always someone available 24/7 if you have an emergency. If you need medication refills please notify your pharmacy one week in advance and they will send [place] a request.

## 2021-01-06 NOTE — Progress Notes (Signed)
   CC: hypertension  HPI:  Kristy [name] is a 65 y.o. with PMH as below.   Please see A&P for assessment of the patient's acute and chronic medical conditions.   Past Medical History:  Diagnosis Date  . Colon polyps   . Colon polyps   . Diabetes mellitus   . Diverticulosis   . Hyperlipidemia   . Hypertension   . 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 Albertine Patricia  . Sleep apnea 2007    status post polysomnogram 2007 , suggested use of CPAP  . Ventricular hypertrophy 2006    a 2-D echo in 2006, ejection fraction 55%, mild tricuspid regurgitation noted as well, 2-D echo November 13, 6960 showed diastolic dysfunction with LVEF normal of 65%   Review of Systems:   Review of Systems  Respiratory: Negative for cough and shortness of breath.   Cardiovascular: Negative for chest pain and leg swelling.  Gastrointestinal: Negative for abdominal pain and diarrhea.  Genitourinary: Negative for dysuria, frequency and urgency.  Neurological: Negative for tingling and weakness.  Endo/Heme/Allergies: Negative for polydipsia.    Physical Exam: Constitution: NAD, appears stated age, obese Cardio: RRR, no m/r/g, no LE edema  Respiratory: non-labored breathing, on RA MSK: moving all extremities Neuro: normal affect, a&ox3 Skin: c/d/i    Vitals:   01/06/21 1315  BP: (!) 162/99  Pulse: 92  Temp: 98 F (36.7 C)  TempSrc: Oral  SpO2: 99%  Weight: 214 lb 6.4 oz (97.3 kg)  Height: 5\' 2"  (1.575 m)    Assessment & Plan:   See Encounters Tab for problem based charting.  Patient discussed with Dr. Jimmye Norman

## 2021-01-07 LAB — BMP8+ANION GAP
Anion Gap: 16 mmol/L (ref 10.0–18.0)
BUN/Creatinine Ratio: 16 (ref 12–28)
BUN: 29 mg/dL — ABNORMAL HIGH (ref 8–27)
CO2: 23 mmol/L (ref 20–29)
Calcium: 10.3 mg/dL (ref 8.7–10.3)
Chloride: 102 mmol/L (ref 96–106)
Creatinine, Ser: 1.76 mg/dL — ABNORMAL HIGH (ref 0.57–1.00)
Glucose: 95 mg/dL (ref 65–99)
Potassium: 4.9 mmol/L (ref 3.5–5.2)
Sodium: 141 mmol/L (ref 134–144)
eGFR: 32 mL/min/{1.73_m2} — ABNORMAL LOW (ref >59)

## 2021-01-07 NOTE — Progress Notes (Signed)
Internal Medicine Clinic Attending  Case discussed with Dr. Seawell  At the time of the visit.  We reviewed the resident's history and exam and pertinent patient test results.  I agree with the assessment, diagnosis, and plan of care documented in the resident's note.  

## 2021-01-13 ENCOUNTER — Other Ambulatory Visit: Payer: Self-pay | Admitting: Student

## 2021-01-13 DIAGNOSIS — F1190 Opioid use, unspecified, uncomplicated: Secondary | ICD-10-CM

## 2021-01-13 MED FILL — NOVOFINE 32G NEEDLES: 32G X 6 MM | 90 days supply | Qty: 100 | Fill #2

## 2021-01-20 ENCOUNTER — Other Ambulatory Visit: Payer: Self-pay | Admitting: Student

## 2021-01-20 DIAGNOSIS — I129 Hypertensive chronic kidney disease with stage 1 through stage 4 chronic kidney disease, or unspecified chronic kidney disease: Secondary | ICD-10-CM | POA: Diagnosis not present

## 2021-01-20 DIAGNOSIS — I701 Atherosclerosis of renal artery: Secondary | ICD-10-CM | POA: Diagnosis not present

## 2021-01-20 DIAGNOSIS — N183 Chronic kidney disease, stage 3 unspecified: Secondary | ICD-10-CM | POA: Diagnosis not present

## 2021-01-20 DIAGNOSIS — E1122 Type 2 diabetes mellitus with diabetic chronic kidney disease: Secondary | ICD-10-CM | POA: Diagnosis not present

## 2021-01-22 ENCOUNTER — Ambulatory Visit (INDEPENDENT_AMBULATORY_CARE_PROVIDER_SITE_OTHER): Payer: Medicare HMO | Admitting: Neurology

## 2021-01-22 DIAGNOSIS — R030 Elevated blood-pressure reading, without diagnosis of hypertension: Secondary | ICD-10-CM

## 2021-01-22 DIAGNOSIS — Z820 Family history of epilepsy and other diseases of the nervous system: Secondary | ICD-10-CM

## 2021-01-22 DIAGNOSIS — G4733 Obstructive sleep apnea (adult) (pediatric): Secondary | ICD-10-CM | POA: Diagnosis not present

## 2021-01-22 DIAGNOSIS — E669 Obesity, unspecified: Secondary | ICD-10-CM

## 2021-01-22 DIAGNOSIS — R351 Nocturia: Secondary | ICD-10-CM

## 2021-01-27 ENCOUNTER — Other Ambulatory Visit (HOSPITAL_COMMUNITY): Payer: Self-pay

## 2021-01-27 ENCOUNTER — Other Ambulatory Visit: Payer: Self-pay | Admitting: Student

## 2021-01-28 NOTE — Addendum Note (Signed)
Addended by: Star Age on: 01/28/2021 05:29 PM   Modules accepted: Orders

## 2021-01-28 NOTE — Procedures (Signed)
   [facility] ([facility])  STUDY DATE: 01-22-21  DOB: 08/07/1956  MRN: [id number]  ORDERING CLINICIAN: Star Age, MD, PhD   REFERRING CLINICIAN: Marianna Payment, MD   CLINICAL INFORMATION/HISTORY: 65 year old woman with a history of hypertension, hyperlipidemia, diabetes, diverticulosis, renal artery stenosis, arthritis, status post right knee replacement, ventricular hypertrophy, colonic polyps and obesity, who was previously diagnosed with obstructive sleep apnea.  She had sleep testing in 2019. She is currently not on CPAP therapy, could not tolerate it at the time.   Epworth sleepiness score: 2/24.  BMI: 38.9 kg/m  Neck Circumference: 16-7/8 "  FINDINGS:   Total Record Time (hours, min): 11 H 43 min  Total Sleep Time (hours, min):  9 H 8 min   Percent REM (%):    21.27 %   Calculated pAHI (per hour): 30.6       REM pAHI: 76.2   NREM pAHI: 23.4 Supine AHI: N/A   Oxygen Saturation (%) Mean: 94  Minimum oxygen saturation (%):        82   O2 Saturation Range (%): 82-99  O2Saturation (minutes) <=88%: 0.1 min  Pulse Mean (bpm):    97  Pulse Range (62-124)   IMPRESSION: OSA (obstructive sleep apnea)  RECOMMENDATION:  This home sleep test demonstrates severe obstructive sleep apnea with a total AHI of 30.6/hour and O2 nadir of 82%. Treatment with positive airway pressure is recommended. The patient will be advised to proceed with an autoPAP titration/trial at home for now. A full night titration study may be considered to optimize treatment settings, if needed down the road. Please note that untreated obstructive sleep apnea may carry additional perioperative morbidity. Patients with significant obstructive sleep apnea should receive perioperative PAP therapy and the surgeons and particularly the anesthesiologist should be informed of the diagnosis and the severity of the sleep disordered breathing. The patient should be cautioned not to drive, work  at heights, or operate dangerous or heavy equipment when tired or sleepy. Review and reiteration of good sleep hygiene measures should be pursued with any patient. Other causes of the patient's symptoms, including circadian rhythm disturbances, an underlying mood disorder, medication effect and/or an underlying medical problem cannot be ruled out based on this test. Clinical correlation is recommended. The patient and her referring provider will be notified of the test results. The patient will be seen in follow up in sleep clinic at [facility].  I certify that I have reviewed the raw data recording prior to the issuance of this report in accordance with the standards of the American Academy of Sleep Medicine (AASM).  INTERPRETING PHYSICIAN:  Star Age, MD, PhD  Board Certified in Neurology and Sleep Medicine  [facility] [address] [place], [place] [address] [phone]

## 2021-01-28 NOTE — Progress Notes (Signed)
Patient referred by Dr. Marianna Payment, seen by me on 12/30/20, patient had a HST on 01/22/21.    Please call and notify the patient that the recent home sleep test showed obstructive sleep apnea in the severe range. I recommend treatment in the form of autoPAP, which means, that we don't have to bring her in for a sleep study with CPAP, but will let her start using a so called autoPAP machine at home, which is a CPAP-like machine with self-adjusting pressures. We will send the order to a local DME company (of her choice, or as per insurance requirement). The DME representative will fit her with a mask, educate her on how to use the machine, how to put the mask on, etc. I have placed an order in the chart. Please send the order, talk to patient, send report to referring MD. We will need a FU in sleep clinic for 10 weeks post-PAP set up, please arrange that with me or one of our NPs. Also reinforce the need for compliance with treatment. Thanks,   Star Age, MD, PhD [facility])

## 2021-01-29 ENCOUNTER — Other Ambulatory Visit (HOSPITAL_COMMUNITY): Payer: Self-pay

## 2021-01-29 ENCOUNTER — Telehealth: Payer: Self-pay

## 2021-01-29 DIAGNOSIS — E669 Obesity, unspecified: Secondary | ICD-10-CM

## 2021-01-29 DIAGNOSIS — R351 Nocturia: Secondary | ICD-10-CM

## 2021-01-29 DIAGNOSIS — G4733 Obstructive sleep apnea (adult) (pediatric): Secondary | ICD-10-CM

## 2021-01-29 MED ORDER — LOSARTAN POTASSIUM-HCTZ 100-25 MG PO TABS
1.0000 | ORAL_TABLET | Freq: Every day | ORAL | 0 refills | Status: DC
  Filled 2021-01-29: qty 30, 30d supply, fill #0

## 2021-01-29 MED ORDER — CARVEDILOL 12.5 MG PO TABS
12.5000 mg | ORAL_TABLET | Freq: Two times a day (BID) | ORAL | 0 refills | Status: DC
  Filled 2021-01-29: qty 60, 30d supply, fill #0

## 2021-01-29 NOTE — Telephone Encounter (Signed)
I called pt. I advised pt that Dr. Rexene Alberts reviewed their sleep study results and found that pt has severe osa. Dr. Rexene Alberts recommends that pt start the autopap. I reviewed PAP compliance expectations with the pt. Pt is agreeable to starting an auto-PAP. I advised pt that an order will be sent to a DME, [facility], and [facility] will call the pt within about one week after they file with the pt's insurance. [facility] will show the pt how to use the machine, fit for masks, and troubleshoot the auto-PAP if needed. Pt understands there is a shortage of machines and start dates will be 6-12 weeks out. A letter with all of this information in it will be mailed to the pt as a reminder. I verified with the pt that the address we have on file is correct. Pt verbalized understanding of results. Pt had no questions at this time but was encouraged to call back if questions arise. I have sent the order to [employer] and have received confirmation that they have received the order.

## 2021-01-29 NOTE — Telephone Encounter (Signed)
-----   Message from Star Age, MD sent at 01/28/2021  5:29 PM EDT ----- Patient referred by Dr. Marianna Payment, seen by me on 12/30/20, patient had a HST on 01/22/21.    Please call and notify the patient that the recent home sleep test showed obstructive sleep apnea in the severe range. I recommend treatment in the form of autoPAP, which means, that we don't have to bring her in for a sleep study with CPAP, but will let her start using a so called autoPAP machine at home, which is a CPAP-like machine with self-adjusting pressures. We will send the order to a local DME company (of her choice, or as per insurance requirement). The DME representative will fit her with a mask, educate her on how to use the machine, how to put the mask on, etc. I have placed an order in the chart. Please send the order, talk to patient, send report to referring MD. We will need a FU in sleep clinic for 10 weeks post-PAP set up, please arrange that with me or one of our NPs. Also reinforce the need for compliance with treatment. Thanks,   Star Age, MD, PhD [facility])

## 2021-02-03 NOTE — Telephone Encounter (Signed)
Please discuss this with the patient, if she is agreeable, we can request a sleep study in the lab.  If she is okay to pursue this, please place an order for nocturnal polysomnogram.

## 2021-02-03 NOTE — Telephone Encounter (Signed)
Received message from [place].  This pt rcvd a CPAP from [employer] in 2019 and failed compliance. She returned the machine after 71 days. Because of this, pt will in an in-lab PSG per Medicare guidelines in order to requalify for PAP therapy. We cannot use the HST on file. I'm sorry!

## 2021-02-04 NOTE — Telephone Encounter (Addendum)
I called pt and we explained message received from [facility]. Pt is agreeable to pursuing in lab. Order placed. Pt will wait to hear from sleep lab in regards to scheduling.  I have updated DME as well.

## 2021-02-04 NOTE — Addendum Note (Signed)
Addended by: Verlin Grills on: 02/04/2021 11:44 AM   Modules accepted: Orders

## 2021-02-07 ENCOUNTER — Other Ambulatory Visit (HOSPITAL_COMMUNITY): Payer: Self-pay

## 2021-02-11 ENCOUNTER — Telehealth: Payer: Self-pay

## 2021-02-11 NOTE — Telephone Encounter (Signed)
Called to inform patient that her medication Xultophy is ready for pickup at office (internal medicine).   *medication is labeled and located in med fridge*

## 2021-02-14 ENCOUNTER — Other Ambulatory Visit: Payer: Self-pay | Admitting: Internal Medicine

## 2021-02-14 DIAGNOSIS — F1190 Opioid use, unspecified, uncomplicated: Secondary | ICD-10-CM

## 2021-02-14 NOTE — Telephone Encounter (Signed)
Last rx written 01/15/21. Last OV 01/06/21. Next OV has not been scheduled. UDS  07/19/18.

## 2021-02-17 DIAGNOSIS — N183 Chronic kidney disease, stage 3 unspecified: Secondary | ICD-10-CM | POA: Diagnosis not present

## 2021-03-02 ENCOUNTER — Other Ambulatory Visit: Payer: Self-pay | Admitting: Internal Medicine

## 2021-03-04 ENCOUNTER — Other Ambulatory Visit (HOSPITAL_COMMUNITY): Payer: Self-pay

## 2021-03-04 MED ORDER — LOSARTAN POTASSIUM-HCTZ 100-25 MG PO TABS
1.0000 | ORAL_TABLET | Freq: Every day | ORAL | 0 refills | Status: DC
  Filled 2021-03-04: qty 30, 30d supply, fill #0

## 2021-03-04 MED ORDER — CARVEDILOL 12.5 MG PO TABS
12.5000 mg | ORAL_TABLET | Freq: Two times a day (BID) | ORAL | 0 refills | Status: DC
  Filled 2021-03-04: qty 60, 30d supply, fill #0

## 2021-03-10 ENCOUNTER — Other Ambulatory Visit: Payer: Self-pay | Admitting: *Deleted

## 2021-03-10 DIAGNOSIS — Z1231 Encounter for screening mammogram for malignant neoplasm of breast: Secondary | ICD-10-CM

## 2021-03-11 DIAGNOSIS — N183 Chronic kidney disease, stage 3 unspecified: Secondary | ICD-10-CM | POA: Diagnosis not present

## 2021-03-11 DIAGNOSIS — E1122 Type 2 diabetes mellitus with diabetic chronic kidney disease: Secondary | ICD-10-CM | POA: Diagnosis not present

## 2021-03-11 DIAGNOSIS — I129 Hypertensive chronic kidney disease with stage 1 through stage 4 chronic kidney disease, or unspecified chronic kidney disease: Secondary | ICD-10-CM | POA: Diagnosis not present

## 2021-03-11 DIAGNOSIS — I701 Atherosclerosis of renal artery: Secondary | ICD-10-CM | POA: Diagnosis not present

## 2021-03-17 ENCOUNTER — Other Ambulatory Visit: Payer: Self-pay | Admitting: Internal Medicine

## 2021-03-17 DIAGNOSIS — F1190 Opioid use, unspecified, uncomplicated: Secondary | ICD-10-CM

## 2021-04-01 NOTE — Patient Instructions (Addendum)
Kristy [name],   Thank you for your visit to the [facility] today. It was a pleasure meeting you. Today we discussed the following:  1) Pap smear - We did your pap smear today. I will call you with the result!  2) Anxiety - I have referred you to Dr. Theodis Shove for counseling - Please schedule next available with Dr. Marianna Payment for follow-up of your anxiety   If you have any questions or concerns, please call our clinic at [phone] between 9am-5pm. Outside of these hours, call [phone] and ask for the internal medicine resident on call. If you feel you are having a medical emergency please call 911.

## 2021-04-01 NOTE — Progress Notes (Signed)
Patient ID: Kristy [name], female    DOB: Feb 16, 1956, 65 y.o.   MRN: [id number]   PCP: Dellia Cloud, MD   Subjective:   CC: pap smear, anxiety  HPI:  [name] is a 65 y.o. woman with history as below who presents to clinic for pap smear and anxiety. Her last clinic visit was on 01/06/21 with Dr. Cleaster Corin.   To see the details of this patient's management of their acute and chronic problems, please refer to the Assessment & Plan under the Encounters tab.    Review of Systems:   Review of Systems  Eyes:  Negative for blurred vision.  Respiratory:  Negative for shortness of breath.   Cardiovascular:  Negative for chest pain.  Gastrointestinal:  Negative for abdominal pain.  Neurological:  Negative for dizziness, weakness and headaches.  Psychiatric/Behavioral:  The patient is nervous/anxious.    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 Samule Ohm   Sleep apnea 2007    status post polysomnogram 2007 , suggested use of CPAP   Ventricular hypertrophy 2006    a 2-D echo in 2006, ejection fraction 55%, mild tricuspid regurgitation noted as well, 2-D echo November 13, 2008 showed diastolic dysfunction with LVEF normal of 65%       ACTIVE MEDICATIONS   Outpatient Medications Prior to Visit  Medication Sig Dispense Refill   Accu-Chek FastClix Lancets MISC Check blood sugar two times a day (Patient taking differently: 1 each by Other route in the morning and at bedtime.) 102 each 5   Ascorbic Acid (VITAMIN C) 1000 MG tablet Take 1,000 mg by mouth daily.     aspirin 81 MG EC tablet Take 81 mg by mouth daily.      atorvastatin (LIPITOR) 40 MG tablet TAKE 1 TABLET (40 MG TOTAL) BY MOUTH DAILY. 90 tablet 3   Blood Glucose Monitoring Suppl (ACCU-CHEK GUIDE) w/Device KIT 1 each by Does not apply route 2 (two) times daily. 1 kit 1   carvedilol (COREG) 12.5 MG tablet Take 1 tablet (12.5 mg total) by mouth 2 (two) times daily. 60 tablet 0   glucose 4 GM chewable tablet Chew 1 tablet by mouth as needed for low blood sugar.     glucose blood (ACCU-CHEK GUIDE) test strip Check blood sugar two times a day (Patient taking differently: 1 each by Other route in the morning and at bedtime.) 100 each 5   Insulin Degludec-Liraglutide (XULTOPHY) 100-3.6 UNIT-MG/ML SOPN INJECT 40 UNITS INTO THE SKIN DAILY. 15 mL 5   Insulin Pen Needle (B-D UF III MINI PEN NEEDLES) 31G X 5 MM MISC 1 Device by Does not apply route 3 (three) times daily before meals. Check daily 3X before meals 100 each 12   Insulin Pen Needle 32G X 6 MM MISC USE AS DIRECTED 100 each 99   Insulin Syringe-Needle U-100 (INSULIN SYRINGE .5CC/31GX5/16") 31G X 5/16" 0.5 ML MISC USE AS DIRECTED ONCE DAILY. 100 each 3   losartan-hydrochlorothiazide (HYZAAR) 100-25 MG tablet Take 1 tablet by mouth daily. 30 tablet 0   Omega-3 Fatty Acids (FISH  OIL) 1000 MG CAPS Take 1,000 mg by mouth daily.     traMADol (ULTRAM) 50 MG tablet TAKE 2 TABLETS BY MOUTH EVERY 6 HOURS AS NEEDED FOR SEVERE PAIN 75 tablet 0   Turmeric 500 MG CAPS Take 500 mg by mouth daily.     No facility-administered medications prior to visit.   Objective:   BP (!) 154/107   Pulse (!) 109   Temp 98.1 F (36.7 C) (Oral)   Ht $R'5\' 2"'LE$  (1.575 m)   Wt 211 lb 6.4 oz (95.9 kg)   LMP 01/05/2009   SpO2 100%   BMI 38.67 kg/m  Wt Readings from Last 3 Encounters:  04/02/21 211 lb 6.4 oz (95.9 kg)  01/06/21 214 lb 6.4 oz (97.3 kg)  12/30/20 212 lb (96.2 kg)   BP Readings from Last 3 Encounters:  04/02/21 (!) 154/107  01/06/21 (!) 159/87  12/30/20 (!) 178/114   Constitutional: well-appearing woman sitting  in chair, in no acute distress HENT: normocephalic atraumatic, mucous membranes moist Eyes: conjunctiva non-erythematous Neck: supple Cardiovascular: regular rate and rhythm, no m/r/g, no lower extremity edema Pulmonary/Chest: normal work of breathing on room air, lungs clear to auscultation bilaterally Abdominal: soft, non-tender, non-distended MSK: normal bulk and tone Neurological: alert & oriented x 3, normal gait Skin: warm and dry Psych: normal mood, anxious affect GU: external genitalia normal in appearance, vagina and cervix normal in appearance   Health Maintenance:   Health Maintenance  Topic Date Due   COVID-19 Vaccine (1) Never done   Pneumococcal Vaccine 65-44 Years old (1 - PCV) Never done   Zoster Vaccines- Shingrix (1 of 2) Never done   COLONOSCOPY (Pts 65-61yrs Insurance coverage will need to be confirmed)  02/04/2020   LIPID PANEL  06/07/2020   TETANUS/TDAP  11/14/2021 (Originally 12/31/2019)   HEMOGLOBIN A1C  04/08/2021   INFLUENZA VACCINE  05/26/2021   OPHTHALMOLOGY EXAM  10/15/2021   FOOT EXAM  12/09/2021   MAMMOGRAM  03/13/2022   PAP SMEAR-Modifier  04/02/2024   Hepatitis C Screening  Completed   HIV Screening  Completed   HPV VACCINES  Aged Out    Assessment & Plan:   Problem List Items Addressed This Visit       Cardiovascular and Mediastinum   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 Patient reports due to anxiety regarding today's pap smear she did not take her antihypertensives this morning which are losartan-HCTZ 50-12.5 mg daily and Coreg 12.5 mg twice daily. During last visit (01/06/21) patient expressed interest in having one physician manage her hypertension, and it was decided nephrology would manage. She reports she saw nephrology since her last clinic visit and no changes were made to her antihypertensives.   Assessment/Plan: Patient's BP continues to be above goal,  however she did not take her antihypertensives this morning secondary to anxiety around getting pap smear. She will follow-up with nephrology for blood pressure changes. - continue  losartan-HCTZ 50-12.5 mg daily and Coreg 12.5 mg twice daily - f/u 1-2 months with PCP, f/u nephrology recs         Other   Anxiety    Patient states she is very anxious about today's pap smear because she has not had one in a while and has a strong family history of cervical cancer. She states it is more fear of the result than the pelvic exam and pap smear itself. However upon further questioning, she states she  has been more anxious and on edge lately, even outside of this appointment and before she knew she was going to have a pap smear. States she has been anxious about leaving her home, and avoided going out for 3-4 days over the weekend. She states once she does go out, she is distracted and no longer anxious, and being around people isn't anxiety-provoking. As we talked more, she states she thinks her husband "puts [her] on edge." Denies physical or emotional abuse but states he focuses more on himself than her. She gets emotional support from her grown children however does not have friends. GAD-7 performed in clinic today of 12. She states she would be interested in referral to our clinic counselor before consideration of medications.  Plan: - referral to [facility], Dr. 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 She has many questions, which I answered, explaining that we do these screening tests in order to hopefully identify pathology early, and we have a plan in place (referral to gyn, which according to our referral coordinator could take 1-2 months given backlog for scheduling gyn). I will send a message to Dr. Theodis Shove so that hopefully the patient can meet with her sooner.       Relevant  Orders   Ambulatory referral to [place]   HSIL on Pap smear of cervix    Patient presents for cervical cancer screening. She states she is very anxious about getting a pap smear because she has a strong family history of cervical cancer (in her mother and sister, both of whom used talcum powder in their undergarments, however patient states she never has) and is fearful of the possible results. See problem-based assessment and plan for anxiety for more.  Plan: - Pap smear with co-testing performed --> Pap resulted POSITIVE for high-risk HPV, diagnosis HSIL --> Discussed result with patient and urgent referral to gynecology for LEEP vs colposcopy placed       Other Visit Diagnoses     Encounter for Pap smear of cervix with HPV DNA cotesting    -  Primary   Relevant Orders   Cytology -Pap Smear (Completed)   Ambulatory referral to Gynecology       Return in about 2 months (around 06/02/2021) for BP check, anxiety follow-up.   Pt seen with Dr. Jimmye Norman.  Alexandria Lodge, MD Internal Medicine Resident, PGY-1 [facility] Pager: [phone] 10:14 AM, 04/04/2021

## 2021-04-02 ENCOUNTER — Other Ambulatory Visit (HOSPITAL_COMMUNITY)
Admission: RE | Admit: 2021-04-02 | Discharge: 2021-04-02 | Disposition: A | Discharge status: Home / Self Care | Payer: Medicare HMO | Source: Ambulatory Visit | Attending: Internal Medicine | Admitting: Internal Medicine

## 2021-04-02 ENCOUNTER — Ambulatory Visit (INDEPENDENT_AMBULATORY_CARE_PROVIDER_SITE_OTHER): Payer: Medicare HMO | Admitting: Student

## 2021-04-02 VITALS — BP 154/107 | HR 109 | Temp 98.1°F | Ht 62.0 in | Wt 211.4 lb

## 2021-04-02 DIAGNOSIS — Z124 Encounter for screening for malignant neoplasm of cervix: Secondary | ICD-10-CM | POA: Insufficient documentation

## 2021-04-02 DIAGNOSIS — F419 Anxiety disorder, unspecified: Secondary | ICD-10-CM | POA: Diagnosis not present

## 2021-04-02 DIAGNOSIS — Z1151 Encounter for screening for human papillomavirus (HPV): Secondary | ICD-10-CM | POA: Diagnosis not present

## 2021-04-02 DIAGNOSIS — I150 Renovascular hypertension: Secondary | ICD-10-CM

## 2021-04-04 DIAGNOSIS — R87613 High grade squamous intraepithelial lesion on cytologic smear of cervix (HGSIL): Secondary | ICD-10-CM | POA: Insufficient documentation

## 2021-04-04 DIAGNOSIS — F419 Anxiety disorder, unspecified: Secondary | ICD-10-CM | POA: Insufficient documentation

## 2021-04-04 LAB — CYTOLOGY - PAP
Comment: NEGATIVE
Diagnosis: HIGH — AB
High risk HPV: POSITIVE — AB

## 2021-04-04 NOTE — Assessment & Plan Note (Addendum)
Patient states she is very anxious about today's pap smear because she has not had one in a while and has a strong family history of cervical cancer. She states it is more fear of the result than the pelvic exam and pap smear itself. However upon further questioning, she states she has been more anxious and on edge lately, even outside of this appointment and before she knew she was going to have a pap smear. States she has been anxious about leaving her home, and avoided going out for 3-4 days over the weekend. She states once she does go out, she is distracted and no longer anxious, and being around people isn't anxiety-provoking. As we talked more, she states she thinks her husband "puts [her] on edge." Denies physical or emotional abuse but states he focuses more on himself than her. She gets emotional support from her grown children however does not have friends. GAD-7 performed in clinic today of 12. She states she would be interested in referral to our clinic counselor before consideration of medications.  Plan: - referral to [facility], Dr. 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 She has many questions, which I answered, explaining that we do these screening tests in order to hopefully identify pathology early, and we have a plan in place (referral to gyn, which according to our referral coordinator could take 1-2 months given backlog for scheduling gyn). I will send a message to Dr. Theodis Shove so that hopefully the patient can meet with her sooner.

## 2021-04-04 NOTE — Assessment & Plan Note (Addendum)
BP elevated today at 154/107 (177/106 initially). Patient reports due to anxiety regarding today's pap smear she did not take her antihypertensives this morning which are losartan-HCTZ 50-12.5 mg daily and Coreg 12.5 mg twice daily. During last visit (01/06/21) patient expressed interest in having one physician manage her hypertension, and it was decided nephrology would manage. She reports she saw nephrology since her last clinic visit and no changes were made to her antihypertensives.   Assessment/Plan: Patient's BP continues to be above goal, however she did not take her antihypertensives this morning secondary to anxiety around getting pap smear. She will follow-up with nephrology for blood pressure changes. - continue  losartan-HCTZ 50-12.5 mg daily and Coreg 12.5 mg twice daily - f/u 1-2 months with PCP, f/u nephrology recs

## 2021-04-04 NOTE — Progress Notes (Signed)
Internal Medicine Clinic Attending  I saw and evaluated the patient.  I personally confirmed the key portions of the history and exam documented by Dr. Watson and I reviewed pertinent patient test results.  The assessment, diagnosis, and plan were formulated together and I agree with the documentation in the resident's note.  

## 2021-04-04 NOTE — Assessment & Plan Note (Addendum)
Patient presents for cervical cancer screening. She states she is very anxious about getting a pap smear because she has a strong family history of cervical cancer (in her mother and sister, both of whom used talcum powder in their undergarments, however patient states she never has) and is fearful of the possible results. See problem-based assessment and plan for anxiety for more.  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

## 2021-04-07 ENCOUNTER — Institutional Professional Consult (permissible substitution): Payer: Medicare HMO | Admitting: Behavioral Health

## 2021-04-07 ENCOUNTER — Telehealth: Payer: Self-pay | Admitting: Behavioral Health

## 2021-04-07 NOTE — Telephone Encounter (Signed)
Contacted Pt per Dr. Alexandria Lodge, MD Referral. Spoke w/Pt for 10-15 min to Assess for telehealth needs. Pt is moderately concerned about upcoming Mammography & Colonoscopy procedures. Pt has [facility] support for visit to these appts.  Pt is receptive to call @ mid-July to assist in preparation & support prior to procedures.  Dr. Theodis Shove

## 2021-04-16 ENCOUNTER — Other Ambulatory Visit (HOSPITAL_COMMUNITY): Payer: Self-pay

## 2021-04-16 ENCOUNTER — Other Ambulatory Visit: Payer: Self-pay

## 2021-04-16 ENCOUNTER — Ambulatory Visit (AMBULATORY_SURGERY_CENTER): Payer: Medicare HMO | Admitting: *Deleted

## 2021-04-16 VITALS — Ht 62.0 in | Wt 210.0 lb

## 2021-04-16 DIAGNOSIS — Z86010 Personal history of colonic polyps: Secondary | ICD-10-CM

## 2021-04-16 MED ORDER — NA SULFATE-K SULFATE-MG SULF 17.5-3.13-1.6 GM/177ML PO SOLN
ORAL | 0 refills | Status: DC
  Filled 2021-04-16: qty 354, 1d supply, fill #0

## 2021-04-16 NOTE — Progress Notes (Signed)
Patient is here in-person for PV. Patient denies any allergies to eggs or soy. Patient denies any problems with anesthesia/sedation. Patient denies any oxygen use at home. Patient denies taking any diet/weight loss medications or blood thinners. Patient is not being treated for MRSA or C-diff. Patient is aware of our care-partner policy and [id number] safety protocol.

## 2021-04-17 ENCOUNTER — Other Ambulatory Visit: Payer: Self-pay | Admitting: Internal Medicine

## 2021-04-17 DIAGNOSIS — F1190 Opioid use, unspecified, uncomplicated: Secondary | ICD-10-CM

## 2021-04-17 MED FILL — Atorvastatin Calcium Tab 40 MG (Base Equivalent): ORAL | 90 days supply | Qty: 90 | Fill #0 | Status: AC

## 2021-04-18 ENCOUNTER — Other Ambulatory Visit (HOSPITAL_COMMUNITY): Payer: Self-pay

## 2021-04-21 ENCOUNTER — Other Ambulatory Visit (HOSPITAL_COMMUNITY): Payer: Self-pay

## 2021-04-21 MED ORDER — LOSARTAN POTASSIUM-HCTZ 100-25 MG PO TABS
1.0000 | ORAL_TABLET | Freq: Every day | ORAL | 0 refills | Status: DC
  Filled 2021-04-21: qty 30, 30d supply, fill #0

## 2021-04-21 MED ORDER — CARVEDILOL 12.5 MG PO TABS
12.5000 mg | ORAL_TABLET | Freq: Two times a day (BID) | ORAL | 0 refills | Status: DC
  Filled 2021-04-21: qty 60, 30d supply, fill #0

## 2021-04-21 NOTE — Telephone Encounter (Signed)
Last office visit: 04/02/21  Last Written: 03/17/2021 #75  Next appt: none scheduled at this time

## 2021-04-21 NOTE — Telephone Encounter (Signed)
Call to patient to confirm dose Takes spironolactone 50 mg daily  Will send to team for consideration

## 2021-04-23 ENCOUNTER — Encounter: Payer: Self-pay | Admitting: Obstetrics and Gynecology

## 2021-04-23 ENCOUNTER — Other Ambulatory Visit (HOSPITAL_COMMUNITY)
Admission: RE | Admit: 2021-04-23 | Discharge: 2021-04-23 | Disposition: A | Discharge status: Home / Self Care | Payer: Medicare HMO | Source: Ambulatory Visit | Attending: Obstetrics and Gynecology | Admitting: Obstetrics and Gynecology

## 2021-04-23 ENCOUNTER — Ambulatory Visit: Payer: Medicare HMO | Admitting: Obstetrics and Gynecology

## 2021-04-23 ENCOUNTER — Other Ambulatory Visit: Payer: Self-pay

## 2021-04-23 VITALS — BP 142/80 | HR 105 | Ht 62.0 in | Wt 211.0 lb

## 2021-04-23 DIAGNOSIS — R87613 High grade squamous intraepithelial lesion on cytologic smear of cervix (HGSIL): Secondary | ICD-10-CM | POA: Diagnosis not present

## 2021-04-23 DIAGNOSIS — B977 Papillomavirus as the cause of diseases classified elsewhere: Secondary | ICD-10-CM | POA: Insufficient documentation

## 2021-04-23 DIAGNOSIS — Z113 Encounter for screening for infections with a predominantly sexual mode of transmission: Secondary | ICD-10-CM | POA: Diagnosis not present

## 2021-04-23 DIAGNOSIS — N871 Moderate cervical dysplasia: Secondary | ICD-10-CM | POA: Diagnosis not present

## 2021-04-23 NOTE — Progress Notes (Signed)
65 y.o. No obstetric history on file. Married Black or [place] American Not Hispanic or Latino female here for abnormal pap. Pap from 04/02/21 returned with HSIL, +HPV. No h/o abnormal pap smears. Prior pap was on 02/18/17 negative (no hpv testing)  Married x 40+ years. They haven't been sexually active in over 12 years. Sleep in different rooms.  She doesn't thinks she has ever had STD testing.    Records from the referring provider were reviewed. The patient has a h/o HTN, ventricular hypertrophy, OSA, DM and renal artery stenosis.  She has had angioplasty of her left renal artery.  Labs 01/06/21 Creatinine 1.76 GFR 32 HgbA1C 6.4%  Patient's last menstrual period was 01/05/2009.          Sexually active: No.  The current method of family planning is post menopausal status.    Exercising: Yes.     Walking  Smoker:  no  Health Maintenance: Pap:  04/02/21 HSIL Hr HPV Positve  02/18/17 WNL  History of abnormal Pap:  yes MMG:  03/14/20 density C , 04/01/20 Right breast US Bi-rads 2 benign  BMD:   none  Colonoscopy: 02/03/10 F/U 10 years  has it scheduled for 04/30/21 TDaP:  12/30/09  Gardasil: NA   reports that she quit smoking about 15 years ago. Her smoking use included cigarettes. She has a 25.00 pack-year smoking history. She has never used smokeless tobacco. She reports that she does not drink alcohol and does not use drugs.  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 Albertine Patricia   Sleep apnea 2007    status post polysomnogram 2007 , suggested use of CPAP   Ventricular hypertrophy 2006    a 2-D echo in 2006, ejection fraction 55%, mild  tricuspid regurgitation noted as well, 2-D echo November 14, 6071 showed diastolic dysfunction with LVEF normal of 65%  HgbA1C from 3/22 was 6.4%  Past Surgical History:  Procedure Laterality Date   BREAST BIOPSY Right 2016   COLONOSCOPY  02/03/2010   RENAL ARTERY STENT  2007    patient is status post renal artery stent placement May 2007 and that was done secondary to renal artery stenosis, fibromuscular dysplasia   REPLACEMENT TOTAL KNEE  02/2011-Dr. 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     aspirin 81 MG EC tablet Take 81 mg by mouth daily.     atorvastatin (LIPITOR) 40 MG tablet TAKE 1 TABLET (40 MG TOTAL) BY MOUTH DAILY. 90 tablet 3   Blood Glucose Monitoring Suppl (ACCU-CHEK GUIDE) w/Device KIT 1 each by Does not apply route 2 (two) times daily. 1 kit 1   carvedilol (COREG) 12.5 MG tablet Take 1 tablet (12.5 mg total) by mouth 2 (two) times daily. 60 tablet 0   glucose  4 GM chewable tablet Chew 1 tablet by mouth as needed for low blood sugar.     glucose blood (ACCU-CHEK GUIDE) test strip Check blood sugar two times a day (Patient taking differently: 1 each by Other route in the morning and at bedtime.) 100 each 5   Insulin Degludec-Liraglutide (XULTOPHY) 100-3.6 UNIT-MG/ML SOPN INJECT 40 UNITS INTO THE SKIN DAILY. 15 mL 5   Insulin Pen Needle (B-D UF III MINI PEN NEEDLES) 31G X 5 MM MISC 1 Device by Does not apply route 3 (three) times daily before meals. Check daily 3X before meals 100 each 12   Insulin Pen Needle 32G X 6 MM MISC USE AS DIRECTED 100 each 99   Insulin Syringe-Needle U-100 (INSULIN SYRINGE .5CC/31GX5/16") 31G X 5/16" 0.5 ML MISC USE AS DIRECTED ONCE DAILY. 100  each 3   losartan-hydrochlorothiazide (HYZAAR) 100-25 MG tablet Take 1 tablet by mouth daily. 30 tablet 0   Multiple Vitamin (MULTIVITAMIN) tablet Take 1 tablet by mouth daily.     Na Sulfate-K Sulfate-Mg Sulf 17.5-3.13-1.6 GM/177ML SOLN TAKE AS DIRECTED. 354 mL 0   Omega-3 Fatty Acids (FISH OIL) 1000 MG CAPS Take 1,000 mg by mouth daily.     spironolactone (ALDACTONE) 50 MG tablet Take 1 tablet by mouth once daily 90 tablet 0   traMADol (ULTRAM) 50 MG tablet TAKE 2 TABLETS BY MOUTH EVERY 6 HOURS AS NEEDED FOR SEVERE PAIN 75 tablet 0   Turmeric 500 MG CAPS Take 500 mg by mouth daily.     No current facility-administered medications for this visit.    Family History  Problem Relation Age of Onset   Diabetes Mother    Cancer Mother    Diabetes Father    Colon polyps Sister    Hypertension Sister    Colon polyps Sister    Hypertension Sister    Esophageal cancer Maternal Aunt    Hypertension Daughter    Stroke Daughter    Hypertension Daughter    Hypertension Daughter    Hypertension Daughter    Bipolar disorder Son    Alcohol abuse Son    Drug abuse Son    Breast cancer Cousin    Cancer Other        breast cancer died in her 1s   Colon cancer Neg Hx    Stomach cancer Neg Hx    Rectal cancer Neg Hx     Review of Systems  All other systems reviewed and are negative.  Exam:   BP (!) 142/80   Pulse (!) 105   Ht $R'5\' 2"'Dm$  (1.575 m)   Wt 211 lb (95.7 kg)   LMP 01/05/2009   SpO2 98%   BMI 38.59 kg/m   Weight change: $RemoveBefore'@WEIGHTCHANGE'lTiAzBVQYQfNG$ @ Height:   Height: $Remove'5\' 2"'WBHdLnj$  (157.5 cm)  Ht Readings from Last 3 Encounters:  04/23/21 $RemoveB'5\' 2"'iuuUtuWL$  (1.575 m)  04/16/21 $RemoveB'5\' 2"'DSSwDSST$  (1.575 m)  04/02/21 $RemoveB'5\' 2"'uKgGYuSW$  (1.575 m)    General appearance: alert, cooperative and appears stated age Head: Normocephalic, without obvious abnormality, atraumatic   Pelvic: External genitalia:  no lesions              Urethra:  normal appearing urethra with no masses, tenderness or lesions              Bartholins and Skenes:  normal                 Vagina: normal appearing vagina with normal color and discharge, no lesions  Cervix: + nabothian cysts, no gross lesions  Colposcopy: unsatisfactory, aceto-white changes with mild mosaics at 2, 4 and 8 o'clock, biopsies taken from all of these sites. ECC done. Negative lugols examination of the upper vagina.      Gae Dry, CMA chaperoned for the exam.  1. HSIL on Pap smear of cervix No prior abnormal paps. Colposcopy unsatisfactory. Biopsies and ECC obtained. - Surgical pathology( [facility]/ [facility]) -Discussed leep, information given -The patient has multiple medical issues, including: ventricular hypertrophy, HTN, renal artery stenosis, abnormal renal function, and well controlled DM. -Given her multiple medical issues, I will check if her primary to see if she feels she is a candidate for having lidocaine anesthesia with epinephrine in the office. 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 Screening examination for STD (sexually transmitted disease) Discussed that HPV is an STD, since she doesn't believe she has had STD testing, will check a full panel.  - SURESWAB CT/NG/T. vaginalis - RPR - HIV Antibody (routine testing w rflx) - Hepatitis C antibody  TE:LMRAJ Shon Baton, MD

## 2021-04-24 ENCOUNTER — Ambulatory Visit: Payer: Medicare HMO | Admitting: Behavioral Health

## 2021-04-24 ENCOUNTER — Other Ambulatory Visit (HOSPITAL_COMMUNITY): Payer: Self-pay

## 2021-04-24 DIAGNOSIS — F331 Major depressive disorder, recurrent, moderate: Secondary | ICD-10-CM

## 2021-04-24 DIAGNOSIS — F419 Anxiety disorder, unspecified: Secondary | ICD-10-CM

## 2021-04-24 LAB — SURESWAB CT/NG/T. VAGINALIS
C. trachomatis RNA, TMA: NOT DETECTED
N. gonorrhoeae RNA, TMA: NOT DETECTED
Trichomonas vaginalis RNA: NOT DETECTED

## 2021-04-24 LAB — HEPATITIS C ANTIBODY
Hepatitis C Ab: NONREACTIVE
SIGNAL TO CUT-OFF: 0.06 (ref <1.00)

## 2021-04-24 LAB — HIV ANTIBODY (ROUTINE TESTING W REFLEX): HIV 1&2 Ab, 4th Generation: NONREACTIVE

## 2021-04-24 LAB — RPR: RPR Ser Ql: NONREACTIVE

## 2021-04-24 NOTE — BH Specialist Note (Signed)
Integrated Behavioral Health via Telemedicine Visit  04/24/2021 Aubriegh Minch [id number]  Number of [place] visits: 1/6 Session Start time: 1:00pm  Session End time: 1:30pm Total time: 30  Referring Provider: Dr. Alexandria Lodge, MD Patient/Family location: Pt @ home in private [facility] Provider location: [facility] Office All persons participating in visit: Pt & Clinician Types of Service: Individual psychotherapy  I connected with [place] and/or [place]'s  self  via  Telephone or Geologist, engineering  (Video is Tree surgeon) and verified that I am speaking with the correct person using two identifiers. Discussed confidentiality: Yes   I discussed the limitations of telemedicine and the availability of in person appointments.  Discussed there is a possibility of technology failure and discussed alternative modes of communication if that failure occurs.  I discussed that engaging in this telemedicine visit, they consent to the provision of behavioral healthcare and the services will be billed under their insurance.  Patient and/or legal guardian expressed understanding and consented to Telemedicine visit: Yes   Presenting Concerns: Patient and/or family reports the following symptoms/concerns: reduced anx/dep due to upcoming procedures for colonoscopy, mammography, & past Gynecological appt to f/u on questionable cells Duration of problem: wks; Severity of problem: mild  Patient and/or Family's Strengths/Protective Factors: Social connections, Social and Emotional competence, Concrete supports in place (healthy food, safe environments, etc.), Sense of purpose, and Physical Health (exercise, healthy diet, medication compliance, etc.)  Goals Addressed: Patient will:  Reduce symptoms of: anxiety, depression, and stress   Increase knowledge and/or ability of: coping skills and stress reduction   Demonstrate ability to: Increase  healthy adjustment to current life circumstances  Progress towards Goals: Ongoing  Interventions: Interventions utilized:  Mindfulness or Relaxation Training and Supportive Counseling Standardized Assessments completed:  screeners prn  Patient and/or Family Response: Pt receptive to brief ck-in call & requests f/u appt after July 12th  Assessment: Patient currently experiencing dec in anx/dep due to health status change issues she has dealt w/this year.   Patient may benefit from cont'd support from objective person other than Family whom she can share her concerns.  Plan: Follow up with behavioral health clinician on : after July 12th when she has completed all her testing & procedures Behavioral recommendations: cont to lean on Family & availabilty for Clinician Referral(s):  None @ this time  I discussed the assessment and treatment plan with the patient and/or parent/guardian. They were provided an opportunity to ask questions and all were answered. They agreed with the plan and demonstrated an understanding of the instructions.   They were advised to call back or seek an in-person evaluation if the symptoms worsen or if the condition fails to improve as anticipated.  Donnetta Hutching, LMFT

## 2021-04-25 LAB — SURGICAL PATHOLOGY

## 2021-04-29 ENCOUNTER — Telehealth: Payer: Self-pay

## 2021-04-29 NOTE — Telephone Encounter (Signed)
Opened in error

## 2021-04-30 ENCOUNTER — Ambulatory Visit (AMBULATORY_SURGERY_CENTER): Payer: Medicare HMO | Admitting: Gastroenterology

## 2021-04-30 ENCOUNTER — Other Ambulatory Visit: Payer: Self-pay

## 2021-04-30 ENCOUNTER — Encounter: Payer: Self-pay | Admitting: Gastroenterology

## 2021-04-30 VITALS — BP 149/83 | HR 78 | Temp 98.6°F | Resp 12 | Ht 62.0 in | Wt 210.0 lb

## 2021-04-30 DIAGNOSIS — D124 Benign neoplasm of descending colon: Secondary | ICD-10-CM | POA: Diagnosis not present

## 2021-04-30 DIAGNOSIS — Z860100 Personal history of colon polyps, unspecified: Secondary | ICD-10-CM

## 2021-04-30 DIAGNOSIS — D122 Benign neoplasm of ascending colon: Secondary | ICD-10-CM

## 2021-04-30 DIAGNOSIS — Z1211 Encounter for screening for malignant neoplasm of colon: Secondary | ICD-10-CM | POA: Diagnosis not present

## 2021-04-30 DIAGNOSIS — D123 Benign neoplasm of transverse colon: Secondary | ICD-10-CM

## 2021-04-30 MED ORDER — SODIUM CHLORIDE 0.9 % IV SOLN: 500.0000 mL | INTRAVENOUS | Status: DC

## 2021-04-30 NOTE — Op Note (Signed)
[place] Patient Name: Kristy [name] Procedure Date: 04/30/2021 10:22 AM MRN: [id number] Endoscopist: Milus Banister , MD Age: 65 Referring MD:  Date of Birth: July 17, 1956 Gender: Female Account #: [id number] Procedure:                Colonoscopy Indications:              High risk colon cancer surveillance: Personal                            history of colonic polyps; Colonoscopy 2011 single                            subCM adenoma Medicines:                Monitored Anesthesia Care Procedure:                Pre-Anesthesia Assessment:                           - Prior to the procedure, a History and Physical                            was performed, and patient medications and                            allergies were reviewed. The patient's tolerance of                            previous anesthesia was also reviewed. The risks                            and benefits of the procedure and the sedation                            options and risks were discussed with the patient.                            All questions were answered, and informed consent                            was obtained. Prior Anticoagulants: The patient has                            taken no previous anticoagulant or antiplatelet                            agents. ASA Grade Assessment: III - A patient with                            severe systemic disease. After reviewing the risks                            and benefits, the patient was deemed in  satisfactory condition to undergo the procedure.                           After obtaining informed consent, the colonoscope                            was passed under direct vision. Throughout the                            procedure, the patient's blood pressure, pulse, and                            oxygen saturations were monitored continuously. The                            CF [id number] #[id number] was introduced through the  anus                            and advanced to the the cecum, identified by                            appendiceal orifice and ileocecal valve. The                            colonoscopy was performed without difficulty. The                            patient tolerated the procedure well. The quality                            of the bowel preparation was good. The ileocecal                            valve, appendiceal orifice, and rectum were                            photographed. Scope In: 10:36:41 AM Scope Out: 10:49:35 AM Scope Withdrawal Time: 0 hours 11 minutes 16 seconds  Total Procedure Duration: 0 hours 12 minutes 54 seconds  Findings:                 A 1 mm polyp was found in the ascending colon. The                            polyp was sessile. The polyp was removed with a                            cold biopsy forceps. Resection and retrieval were                            complete.                           Four sessile polyps were found in the descending  colon, transverse colon and ascending colon. The                            polyps were 3 to 5 mm in size. These polyps were                            removed with a cold snare. Resection and retrieval                            were complete.                           Multiple small and large-mouthed diverticula were                            found in the left colon.                           The exam was otherwise without abnormality on                            direct and retroflexion views. Complications:            No immediate complications. Estimated blood loss:                            None. Estimated Blood Loss:     Estimated blood loss: none. Impression:               - One 1 mm polyp in the ascending colon, removed                            with a cold biopsy forceps. Resected and retrieved.                           - Four 3 to 5 mm polyps in the descending colon, in                             the transverse colon and in the ascending colon,                            removed with a cold snare. Resected and retrieved.                           - Diverticulosis in the left colon.                           - The examination was otherwise normal on direct                            and retroflexion views. Recommendation:           - Patient has a contact number available for  emergencies. The signs and symptoms of potential                            delayed complications were discussed with the                            patient. Return to normal activities tomorrow.                            Written discharge instructions were provided to the                            patient.                           - Resume previous diet.                           - Continue present medications.                           - Await pathology results. Milus Banister, MD 04/30/2021 10:53:04 AM This report has been signed electronically.

## 2021-04-30 NOTE — Progress Notes (Signed)
A and O x3. Report to RN. Tolerated MAC anesthesia well. 

## 2021-04-30 NOTE — Progress Notes (Signed)
Vs vv I have reviewed the patient's medical history in detail and updated the computerized patient record.

## 2021-04-30 NOTE — Progress Notes (Signed)
Called to room to assist during endoscopic procedure.  Patient ID and intended procedure confirmed with present staff. Received instructions for my participation in the procedure from the performing physician.  

## 2021-04-30 NOTE — Patient Instructions (Signed)
Discharge instructions given. Handouts on polyps and diverticulosis. Resume previous medications. YOU HAD AN ENDOSCOPIC PROCEDURE TODAY AT THE [facility]:   Refer to the procedure report that was given to you for any specific questions about what was found during the examination.  If the procedure report does not answer your questions, please call your gastroenterologist to clarify.  If you requested that your care partner not be given the details of your procedure findings, then the procedure report has been included in a sealed envelope for you to review at your convenience later.  YOU SHOULD EXPECT: Some feelings of bloating in the abdomen. Passage of more gas than usual.  Walking can help get rid of the air that was put into your GI tract during the procedure and reduce the bloating. If you had a lower endoscopy (such as a colonoscopy or flexible sigmoidoscopy) you may notice spotting of blood in your stool or on the toilet paper. If you underwent a bowel prep for your procedure, you may not have a normal bowel movement for a few days.  Please Note:  You might notice some irritation and congestion in your nose or some drainage.  This is from the oxygen used during your procedure.  There is no need for concern and it should clear up in a day or so.  SYMPTOMS TO REPORT IMMEDIATELY:   Following lower endoscopy (colonoscopy or flexible sigmoidoscopy):  Excessive amounts of blood in the stool  Significant tenderness or worsening of abdominal pains  Swelling of the abdomen that is new, acute  Fever of 100F or higher   For urgent or emergent issues, a gastroenterologist can be reached at any hour by calling [phone]. Do not use MyChart messaging for urgent concerns.    DIET:  We do recommend a small meal at first, but then you may proceed to your regular diet.  Drink plenty of fluids but you should avoid alcoholic beverages for 24 hours.  ACTIVITY:  You should plan to take  it easy for the rest of today and you should NOT DRIVE or use heavy machinery until tomorrow (because of the sedation medicines used during the test).    FOLLOW UP: Our staff will call the number listed on your records 48-72 hours following your procedure to check on you and address any questions or concerns that you may have regarding the information given to you following your procedure. If we do not reach you, we will leave a message.  We will attempt to reach you two times.  During this call, we will ask if you have developed any symptoms of COVID 19. If you develop any symptoms (ie: fever, flu-like symptoms, shortness of breath, cough etc.) before then, please call [phone].  If you test positive for Covid 19 in the 2 weeks post procedure, please call and report this information to us.    If any biopsies were taken you will be contacted by phone or by letter within the next 1-3 weeks.  Please call us at [phone] if you have not heard about the biopsies in 3 weeks.    SIGNATURES/CONFIDENTIALITY: You and/or your care partner have signed paperwork which will be entered into your electronic medical record.  These signatures attest to the fact that that the information above on your After Visit Summary has been reviewed and is understood.  Full responsibility of the confidentiality of this discharge information lies with you and/or your care-partner. 

## 2021-05-02 ENCOUNTER — Telehealth: Payer: Self-pay | Admitting: Obstetrics and Gynecology

## 2021-05-02 NOTE — Telephone Encounter (Signed)
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 As of 04/29/21 the patient wasn't answering her phone and voice mail wasn't set up. She needs a leep. Primary care provider states she is fine to do that in the office with local with epi. Please try and call the patient again, if you can't reach her we need to send a letter. She needs to be set up for a leep in the office. Thanks.

## 2021-05-05 NOTE — Telephone Encounter (Signed)
I spoke with patient and informed her of result and need for LEEP. I did let her know that Dr. Talbert Nan had contacted her PCP and okay for procedure in the office.  She is agreeable to schedule and message was sent to our appt desk to contact her to arrange LEEP in the office.

## 2021-05-05 NOTE — Telephone Encounter (Signed)
LEEP is scheduled for 05/12/21.

## 2021-05-06 ENCOUNTER — Other Ambulatory Visit: Payer: Self-pay

## 2021-05-06 ENCOUNTER — Telehealth: Payer: Self-pay

## 2021-05-06 ENCOUNTER — Ambulatory Visit
Admission: RE | Admit: 2021-05-06 | Discharge: 2021-05-06 | Disposition: A | Discharge status: Home / Self Care | Payer: Medicare HMO | Source: Ambulatory Visit | Attending: *Deleted | Admitting: *Deleted

## 2021-05-06 ENCOUNTER — Encounter: Payer: Self-pay | Admitting: Gastroenterology

## 2021-05-06 DIAGNOSIS — Z1231 Encounter for screening mammogram for malignant neoplasm of breast: Secondary | ICD-10-CM | POA: Diagnosis not present

## 2021-05-06 NOTE — Telephone Encounter (Signed)
Called to remind pt that she had 4 boxes of xultophy that were delivered to clinic from [employer] last month... pt says she still has 3 boxes left. Pt agreed to have medication taken off auto fill and will call when she has about a month of medication remaining.  I will be placing the 4 boxes into the sample medication area in fridge, for [facility] use.

## 2021-05-08 ENCOUNTER — Other Ambulatory Visit: Payer: Self-pay

## 2021-05-12 ENCOUNTER — Ambulatory Visit (INDEPENDENT_AMBULATORY_CARE_PROVIDER_SITE_OTHER): Payer: Medicare HMO | Admitting: Obstetrics and Gynecology

## 2021-05-12 ENCOUNTER — Other Ambulatory Visit: Payer: Self-pay

## 2021-05-12 ENCOUNTER — Other Ambulatory Visit (HOSPITAL_COMMUNITY)
Admission: RE | Admit: 2021-05-12 | Discharge: 2021-05-12 | Disposition: A | Discharge status: Home / Self Care | Payer: Medicare HMO | Source: Ambulatory Visit | Attending: Obstetrics and Gynecology | Admitting: Obstetrics and Gynecology

## 2021-05-12 ENCOUNTER — Encounter: Payer: Self-pay | Admitting: Obstetrics and Gynecology

## 2021-05-12 VITALS — BP 130/88 | HR 110 | Ht 62.0 in | Wt 212.0 lb

## 2021-05-12 DIAGNOSIS — D060 Carcinoma in situ of endocervix: Secondary | ICD-10-CM | POA: Diagnosis not present

## 2021-05-12 DIAGNOSIS — Z86010 Personal history of colonic polyps: Secondary | ICD-10-CM | POA: Insufficient documentation

## 2021-05-12 DIAGNOSIS — D061 Carcinoma in situ of exocervix: Secondary | ICD-10-CM | POA: Diagnosis not present

## 2021-05-12 DIAGNOSIS — N871 Moderate cervical dysplasia: Secondary | ICD-10-CM

## 2021-05-12 NOTE — Progress Notes (Signed)
GYNECOLOGY  VISIT   HPI: 65 y.o.   Married Black or [place] American Not Hispanic or Latino  female   [phone] with Patient's last menstrual period was 01/05/2009.   here for Leep procedure.   Patient with HSIL pap, cervical biopsies and ECC returned with CIN II. Cardiology approved her for in office procedure.   GYNECOLOGIC HISTORY: Patient's last menstrual period was 01/05/2009. Contraception:PMP Menopausal hormone therapy: none         OB History     Gravida  6   Para  6   Term      Preterm      AB      Living  6      SAB      IAB      Ectopic      Multiple      Live Births                 Patient Active Problem List   Diagnosis Date Noted   HSIL on Pap smear of cervix 04/04/2021   Anxiety 04/04/2021   Pneumonia due to COVID-19 virus 11/24/2020   AKI (acute kidney injury) ([place]) 11/23/2020   Lower extremity edema 06/08/2019   Osteoarthritis of both knees 07/29/2018   Obstructive sleep apnea 10/07/2017   Claudication ([place]) 10/07/2017   CKD (chronic kidney disease) stage 4, GFR 15-29 ml/min ([place]) 06/04/2017   Hypercalcemia 06/26/2016   Vitamin D deficiency 12/07/2014   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   Healthcare maintenance 08/23/2013   Chronic, continuous use of opioids 03/14/2013   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 Albertine Patricia   Sleep apnea 2007    status post polysomnogram 2007 , suggested use of CPAP    Ventricular hypertrophy 2006    a 2-D echo in 2006, ejection fraction 55%, mild tricuspid regurgitation noted as well, 2-D echo November 13, 7320 showed diastolic dysfunction with LVEF normal of 65%    Past Surgical History:  Procedure Laterality Date   BREAST BIOPSY Right 2016   COLONOSCOPY  02/03/2010   RENAL ARTERY STENT  2007    patient is status post renal artery stent placement May 2007 and that was done secondary to renal artery stenosis, fibromuscular dysplasia   REPLACEMENT TOTAL KNEE  02/2011-Dr. 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     aspirin 81 MG EC tablet Take 81 mg by mouth daily.     atorvastatin (LIPITOR) 40 MG tablet TAKE 1 TABLET (40 MG TOTAL) BY MOUTH DAILY. 90 tablet 3   Blood Glucose Monitoring Suppl (ACCU-CHEK GUIDE) w/Device KIT 1  each by Does not apply route 2 (two) times daily. 1 kit 1   carvedilol (COREG) 12.5 MG tablet Take 1 tablet (12.5 mg total) by mouth 2 (two) times daily. 60 tablet 0   glucose 4 GM chewable tablet Chew 1 tablet by mouth as needed for low blood sugar.     glucose blood (ACCU-CHEK GUIDE) test strip Check blood sugar two times a day (Patient taking differently: 1 each by Other route in the morning and at bedtime.) 100 each 5   Insulin Degludec-Liraglutide (XULTOPHY) 100-3.6 UNIT-MG/ML SOPN INJECT 40 UNITS INTO THE SKIN DAILY. 15 mL 5   Insulin Pen Needle (B-D UF III MINI PEN NEEDLES) 31G X 5 MM MISC 1 Device by Does not apply route 3 (three) times daily before meals. Check daily 3X before meals 100 each 12   Insulin Pen Needle 32G X 6 MM MISC USE AS DIRECTED 100 each 99   Insulin Syringe-Needle U-100 (INSULIN SYRINGE .5CC/31GX5/16")  31G X 5/16" 0.5 ML MISC USE AS DIRECTED ONCE DAILY. 100 each 3   losartan-hydrochlorothiazide (HYZAAR) 100-25 MG tablet Take 1 tablet by mouth daily. 30 tablet 0   Multiple Vitamin (MULTIVITAMIN) tablet Take 1 tablet by mouth daily.     Na Sulfate-K Sulfate-Mg Sulf 17.5-3.13-1.6 GM/177ML SOLN TAKE AS DIRECTED. 354 mL 0   Omega-3 Fatty Acids (FISH OIL) 1000 MG CAPS Take 1,000 mg by mouth daily.     spironolactone (ALDACTONE) 50 MG tablet Take 1 tablet by mouth once daily 90 tablet 0   traMADol (ULTRAM) 50 MG tablet TAKE 2 TABLETS BY MOUTH EVERY 6 HOURS AS NEEDED FOR SEVERE PAIN 75 tablet 0   Turmeric 500 MG CAPS Take 500 mg by mouth daily.     No current facility-administered medications for this visit.     ALLERGIES: Patient has no known allergies.  Family History  Problem Relation Age of Onset   Diabetes Mother    Cancer Mother    Diabetes Father    Colon polyps Sister    Hypertension Sister    Colon polyps Sister    Hypertension Sister    Esophageal cancer Maternal Aunt    Hypertension Daughter    Stroke Daughter    Hypertension Daughter    Hypertension Daughter    Hypertension Daughter    Bipolar disorder Son    Alcohol abuse Son    Drug abuse Son    Breast cancer Cousin    Cancer Other        breast cancer died in her 89s   Colon cancer Neg Hx    Stomach cancer Neg Hx    Rectal cancer Neg Hx     Social History   Socioeconomic History   Marital status: Married    Spouse name: Not on file   Number of children: Not on file   Years of education: 11   Highest education level: Not on file  Occupational History   Not on file  Tobacco Use   Smoking status: Former    Packs/day: 1.00    Years: 25.00    Pack years: 25.00    Types: Cigarettes    Quit date: 10/26/2005    Years since quitting: 15.5   Smokeless tobacco: Never  Vaping Use   Vaping Use: Never used  Substance and Sexual Activity   Alcohol use: No    Alcohol/week: 0.0 standard drinks   Drug use: No    Sexual activity: Not Currently    Partners: Male  Birth control/protection: Abstinence  Other Topics Concern   Not on file  Social History Narrative   Current Social History 11/14/2020        Patient lives with spouse in a home which is 1 story. There are steps up to the entrance with a handrail which the patient uses.       Patient's method of transportation is personal car.      The highest level of education was some high school, 11th grade.      The patient currently disabled.      Identified important Relationships are "my husband"       Pets : a dog, Research scientist (medical) / Fun: "Nothing since Covid"       Current Stressors: None       Religious / Personal Beliefs: Non-Christian       Other: None    Social Determinants of Health   Financial Resource Strain: Not on file  Food Insecurity: Not on file  Transportation Needs: Not on file  Physical Activity: Not on file  Stress: Not on file  Social Connections: Not on file  Intimate Partner Violence: Not on file    Review of Systems  All other systems reviewed and are negative.  PHYSICAL EXAMINATION:    BP 130/88   Pulse (!) 110   Ht $R'5\' 2"'xc$  (1.575 m)   Wt 212 lb (96.2 kg)   LMP 01/05/2009   SpO2 90%   BMI 38.78 kg/m     General appearance: alert, cooperative and appears stated age   Pelvic: External genitalia:  no lesions              Urethra:  normal appearing urethra with no masses, tenderness or lesions              Bartholins and Skenes: normal                 Vagina: normal appearing vagina with normal color and discharge, no lesions              Cervix: no gross lesions.  Procedure: The patient was counseled as to the risks of the procedure, including: infection, bleeding, and cervical stenosis. A consent form was signed.  Under colposcopic guidance, Lugols solution was placed on the cervix and a paracervical block was injected using 1% lidocaine with epinephrine. Under colposcopic guidance, the 2 x  0.8 cm loop was used to remove a portion of the ectocervix taking care to get the entire transformation zone.  A second 1 x 1 cm loop was used to remove a portion of the endocervix. The settings were 55 cut, 73 coag with a blend of 1.  An ECC was performed. The cautery ball was then used to cauterize the base of the biopsy site and monsels were placed. The patient tolerated the procedure well.    Chaperone was present for exam.  1. 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

## 2021-05-12 NOTE — Patient Instructions (Signed)
LEEP Post-procedure Instructions . Cramping is common.  You may take Ibuprofen, Aleve, or Tylenol for the cramping.  This should resolve within the next two to three days.   . You may have bright red spotting or blackish discharge for several days after your procedure.  The discharge occurs because of a topical solution used to stop bleeding at the biopsy site(s).  The dark discharge will lighten and then turn clear before completely resolving.  You will need to wear a mini pad during this time. . . Refrain from putting anything in the vagina until the bleeding and/or discharge COMPLETLEY stops (usually two to three weeks). . You need to call the office if you have any pelvic pain, fever, heavy bleeding, foul smelling vaginal discharge, or if you are concerned. . Shower or bathe as normal . You will be notified within one week of your biopsy results or we will discuss your results at your follow-up appointment if needed. . You will need to return for surveillance Pap smear(s) as advised by your physician.  

## 2021-05-14 LAB — SURGICAL PATHOLOGY

## 2021-05-19 ENCOUNTER — Other Ambulatory Visit: Payer: Self-pay

## 2021-05-19 ENCOUNTER — Other Ambulatory Visit: Payer: Self-pay | Admitting: Internal Medicine

## 2021-05-19 DIAGNOSIS — N871 Moderate cervical dysplasia: Secondary | ICD-10-CM

## 2021-05-19 DIAGNOSIS — D126 Benign neoplasm of colon, unspecified: Secondary | ICD-10-CM

## 2021-05-19 DIAGNOSIS — F1190 Opioid use, unspecified, uncomplicated: Secondary | ICD-10-CM

## 2021-05-20 DIAGNOSIS — E1122 Type 2 diabetes mellitus with diabetic chronic kidney disease: Secondary | ICD-10-CM | POA: Diagnosis not present

## 2021-05-20 DIAGNOSIS — N189 Chronic kidney disease, unspecified: Secondary | ICD-10-CM | POA: Diagnosis not present

## 2021-05-20 DIAGNOSIS — N183 Chronic kidney disease, stage 3 unspecified: Secondary | ICD-10-CM | POA: Diagnosis not present

## 2021-05-20 DIAGNOSIS — I701 Atherosclerosis of renal artery: Secondary | ICD-10-CM | POA: Diagnosis not present

## 2021-05-20 DIAGNOSIS — I129 Hypertensive chronic kidney disease with stage 1 through stage 4 chronic kidney disease, or unspecified chronic kidney disease: Secondary | ICD-10-CM | POA: Diagnosis not present

## 2021-05-21 ENCOUNTER — Ambulatory Visit: Payer: Medicare HMO | Admitting: Behavioral Health

## 2021-05-21 DIAGNOSIS — F330 Major depressive disorder, recurrent, mild: Secondary | ICD-10-CM

## 2021-05-21 DIAGNOSIS — F419 Anxiety disorder, unspecified: Secondary | ICD-10-CM

## 2021-05-21 NOTE — BH Specialist Note (Signed)
Integrated Behavioral Health via Telemedicine Visit  05/21/2021 Kristy [name] [id number]  Number of [place] visits: 2/6 Session Start time: 11:30am  Session End time: 11:45am Total time: 15  Referring Provider: Dr. Alexandria Lodge, MD Patient/Family location: Pt is home in private [facility] Provider location: [facility] Office All persons participating in visit: Pt & Clinician Types of Service: Health Promotion  I connected with [name] and/or [name]'s  self  via  Telephone or Geologist, engineering  (Video is Tree surgeon) and verified that I am speaking with the correct person using two identifiers. Discussed confidentiality:  2nd visit  I discussed the limitations of telemedicine and the availability of in person appointments.  Discussed there is a possibility of technology failure and discussed alternative modes of communication if that failure occurs.  I discussed that engaging in this telemedicine visit, they consent to the provision of behavioral healthcare and the services will be billed under their insurance.  Patient and/or legal guardian expressed understanding and consented to Telemedicine visit:  2nd visit  Presenting Concerns: Patient and/or family reports the following symptoms/concerns: Sx of anx/dep are reduced due to Pt's positive visit to the GYNE with good outcome Duration of problem: ~ one month; Severity of problem: moderate distress over changes in female reproductive health until r/o by Gyne  Patient and/or Family's Strengths/Protective Factors: Social connections, Social and Emotional competence, Concrete supports in place (healthy food, safe environments, etc.), Sense of purpose, and Physical Health (exercise, healthy diet, medication compliance, etc.)  Goals Addressed: Patient will:  Reduce symptoms of: depression and stress; both dec'd  Increase knowledge and/or ability of: coping skills and healthy  habits   Demonstrate ability to: Increase healthy adjustment to current life circumstances  Progress towards Goals: Ongoing; with Pt expression of much improved mental health status  Interventions: Interventions utilized:  Supportive Counseling Standardized Assessments completed:  screeners prn  Patient and/or Family Response: Pt receptive & grateful for call today. Pt requests future ck-in & feels much better.  Assessment: Patient currently experiencing reduction in distress due to health status alerts which happened over the course of past few months. Pt is pleased w/the outcome of her Gyne appt & is relieved to know she is cleared. Pt is walking when the heat has dissipated & is caring for self, expressing humor about her Adult Children & the lack of attn they have about her health. Validated Pt concerns & normalized the lack of awareness on the part of some Family members. Pt is taking this all in stride.   Patient may benefit from a few more ck-ins as she deems necessary.  Plan: Follow up with behavioral health clinician on : 2-3 wks for 30 min on telehealth Behavioral recommendations: Cont to care for self in all ways. Enjoy the relief! Do good for you & place health needs first. Referral(s): [place] (In Clinic)  I discussed the assessment and treatment plan with the patient and/or parent/guardian. They were provided an opportunity to ask questions and all were answered. They agreed with the plan and demonstrated an understanding of the instructions.   They were advised to call back or seek an in-person evaluation if the symptoms worsen or if the condition fails to improve as anticipated.  Donnetta Hutching, LMFT

## 2021-06-05 ENCOUNTER — Other Ambulatory Visit: Payer: Self-pay | Admitting: Internal Medicine

## 2021-06-05 ENCOUNTER — Other Ambulatory Visit (HOSPITAL_COMMUNITY): Payer: Self-pay

## 2021-06-09 ENCOUNTER — Other Ambulatory Visit (HOSPITAL_COMMUNITY): Payer: Self-pay

## 2021-06-09 MED ORDER — LOSARTAN POTASSIUM-HCTZ 100-25 MG PO TABS
1.0000 | ORAL_TABLET | Freq: Every day | ORAL | 0 refills | Status: DC
  Filled 2021-06-09: qty 30, 30d supply, fill #0

## 2021-06-09 MED ORDER — CARVEDILOL 12.5 MG PO TABS
12.5000 mg | ORAL_TABLET | Freq: Two times a day (BID) | ORAL | 0 refills | Status: DC
  Filled 2021-06-09: qty 60, 30d supply, fill #0

## 2021-06-11 ENCOUNTER — Ambulatory Visit: Payer: Medicare HMO | Admitting: Behavioral Health

## 2021-06-11 DIAGNOSIS — F419 Anxiety disorder, unspecified: Secondary | ICD-10-CM

## 2021-06-11 DIAGNOSIS — F331 Major depressive disorder, recurrent, moderate: Secondary | ICD-10-CM

## 2021-06-11 NOTE — BH Specialist Note (Signed)
Integrated Behavioral Health via Telemedicine Visit  06/11/2021 Kristy [name] [id number]  Number of [place] visits: 3/6 Session Start time: 10:00am  Session End time: 10:30am Total time: 30  Referring Provider: Dr. Alexandria Lodge, MD Patient/Family location: Pt is home in private [facility] Provider location: [facility] Office All persons participating in visit: Pt & Clinician Types of Service: Individual psychotherapy  I connected with [place] and/or [place]'s  self  via  Telephone or Geologist, engineering  (Video is Tree surgeon) and verified that I am speaking with the correct person using two identifiers. Discussed confidentiality:  3rd visit  I discussed the limitations of telemedicine and the availability of in person appointments.  Discussed there is a possibility of technology failure and discussed alternative modes of communication if that failure occurs.  I discussed that engaging in this telemedicine visit, they consent to the provision of behavioral healthcare and the services will be billed under their insurance.  Patient and/or legal guardian expressed understanding and consented to Telemedicine visit:  3rd visit  Presenting Concerns: Patient and/or family reports the following symptoms/concerns: reducted anx/dep due to resolution of cervical concerns w/Dr. Talbert Nan last month via Office procedure Duration of problem: acutely for ~ one month; Severity of problem: moderate  Patient and/or Family's Strengths/Protective Factors: Social connections, Social and Emotional competence, Concrete supports in place (healthy food, safe environments, etc.), Sense of purpose, and strong Family support system  Goals Addressed: Patient will:  Reduce symptoms of: anxiety, depression, and stress   Increase knowledge and/or ability of: coping skills and stress reduction   Demonstrate ability to: Increase healthy adjustment to current  life circumstances  Progress towards Goals: Revised; Pt is doing well since resolution of Gyne issue.   Interventions: Interventions utilized:  Supportive Counseling Standardized Assessments completed: Not Needed  Patient and/or Family Response: Pt receptive to call today & agrees we can terminate mental health services currently w/Pt acknowledgment she can all to schedule prn.  Assessment: Patient currently experiencing reduction in anx/dep over health status changes. Pt has full support of her four Dtrs & 7 Gchildren. Pt has one Dtr living w/her & her Gdtr. Pt feels stronger & able to manage on her own for the most part.   Patient may benefit from future psychotherapy prn.  Plan: Follow up with behavioral health clinician on : Not @ this time Behavioral recommendations: None @ this time Referral(s):  None @ this time  I discussed the assessment and treatment plan with the patient and/or parent/guardian. They were provided an opportunity to ask questions and all were answered. They agreed with the plan and demonstrated an understanding of the instructions.   They were advised to call back or seek an in-person evaluation if the symptoms worsen or if the condition fails to improve as anticipated.  Donnetta Hutching, LMFT

## 2021-06-12 ENCOUNTER — Encounter: Payer: Self-pay | Admitting: Obstetrics and Gynecology

## 2021-06-12 ENCOUNTER — Other Ambulatory Visit: Payer: Self-pay

## 2021-06-12 ENCOUNTER — Ambulatory Visit (INDEPENDENT_AMBULATORY_CARE_PROVIDER_SITE_OTHER): Payer: Medicare HMO | Admitting: Obstetrics and Gynecology

## 2021-06-12 VITALS — BP 150/100 | HR 96 | Resp 14 | Ht 62.0 in | Wt 210.0 lb

## 2021-06-12 DIAGNOSIS — Z9889 Other specified postprocedural states: Secondary | ICD-10-CM

## 2021-06-12 DIAGNOSIS — R87613 High grade squamous intraepithelial lesion on cytologic smear of cervix (HGSIL): Secondary | ICD-10-CM

## 2021-06-12 NOTE — Progress Notes (Signed)
GYNECOLOGY  VISIT   HPI: 65 y.o.   Married Black or [place] American Not Hispanic or Latino  female   [phone] with Patient's last menstrual period was 01/05/2009.   here for   one month LEEP follow up. 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  She is without c/o.   GYNECOLOGIC HISTORY: Patient's last menstrual period was 01/05/2009. Contraception:PMP Menopausal hormone therapy: none        OB History     Gravida  6   Para  6   Term      Preterm      AB      Living  6      SAB      IAB      Ectopic      Multiple      Live Births                 Patient Active Problem List   Diagnosis Date Noted   HSIL on Pap smear of cervix 04/04/2021   Anxiety 04/04/2021   Pneumonia due to COVID-19 virus 11/24/2020   AKI (acute kidney injury) ([place]) 11/23/2020   Lower extremity edema 06/08/2019   Osteoarthritis of both knees 07/29/2018   Obstructive sleep apnea 10/07/2017   Claudication ([place]) 10/07/2017   CKD (chronic kidney disease) stage 4, GFR 15-29 ml/min ([place]) 06/04/2017   Hypercalcemia 06/26/2016   Vitamin D deficiency 12/07/2014   Moderate major depression ([place]) 08/09/2014   Healthcare maintenance 08/23/2013   Chronic, continuous use of opioids 03/14/2013   Renovascular hypertension secondary to Fibromuscular Dysplasia  12/20/2012   DM type 2 with diabetic peripheral neuropathy ([place]) 12/06/2012   Dyslipidemia 07/21/2006    Past Medical History:  Diagnosis Date   Colon polyps    Colon polyps    Diabetes mellitus    Diverticulosis    Hyperlipidemia    Hypertension    Renal artery stenosis ([place]) 2007    status post selective bilateral renal angiography, balloon angiopathy of the left renal artery, first diagnosed in 2007 based on MRA of the renal arteries which 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 Albertine Patricia   Sleep apnea 2007    status post polysomnogram 2007 , suggested  use of CPAP   Ventricular hypertrophy 2006    a 2-D echo in 2006, ejection fraction 55%, mild tricuspid regurgitation noted as well, 2-D echo November 13, 9526 showed diastolic dysfunction with LVEF normal of 65%    Past Surgical History:  Procedure Laterality Date   BREAST BIOPSY Right 2016   COLONOSCOPY  02/03/2010   RENAL ARTERY STENT  2007    patient is status post renal artery stent placement May 2007 and that was done secondary to renal artery stenosis, fibromuscular dysplasia   REPLACEMENT TOTAL KNEE  02/2011-Dr. 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     aspirin 81 MG EC tablet Take 81 mg by mouth daily.     atorvastatin (LIPITOR) 40 MG tablet TAKE 1 TABLET (40 MG TOTAL) BY MOUTH DAILY. 90 tablet 3  Blood Glucose Monitoring Suppl (ACCU-CHEK GUIDE) w/Device KIT 1 each by Does not apply route 2 (two) times daily. 1 kit 1   carvedilol (COREG) 12.5 MG tablet Take 1 tablet (12.5 mg total) by mouth 2 (two) times daily. 60 tablet 0   glucose 4 GM chewable tablet Chew 1 tablet by mouth as needed for low blood sugar.     glucose blood (ACCU-CHEK GUIDE) test strip Check blood sugar two times a day (Patient taking differently: 1 each by Other route in the morning and at bedtime.) 100 each 5   Insulin Degludec-Liraglutide (XULTOPHY) 100-3.6 UNIT-MG/ML SOPN INJECT 40 UNITS INTO THE SKIN DAILY. 15 mL 5   Insulin Pen Needle (B-D UF III MINI PEN NEEDLES) 31G X 5 MM MISC 1 Device by Does not apply route 3 (three) times daily before meals. Check daily 3X before meals 100 each 12   Insulin Syringe-Needle U-100 (INSULIN SYRINGE .5CC/31GX5/16") 31G X 5/16" 0.5 ML MISC USE AS DIRECTED ONCE  DAILY. 100 each 3   losartan-hydrochlorothiazide (HYZAAR) 100-25 MG tablet Take 1 tablet by mouth daily. 30 tablet 0   Multiple Vitamin (MULTIVITAMIN) tablet Take 1 tablet by mouth daily.     Na Sulfate-K Sulfate-Mg Sulf 17.5-3.13-1.6 GM/177ML SOLN TAKE AS DIRECTED. 354 mL 0   Omega-3 Fatty Acids (FISH OIL) 1000 MG CAPS Take 1,000 mg by mouth daily.     spironolactone (ALDACTONE) 50 MG tablet Take 1 tablet by mouth once daily 90 tablet 0   traMADol (ULTRAM) 50 MG tablet TAKE 2 TABLETS BY MOUTH EVERY 6 HOURS AS NEEDED FOR SEVERE PAIN 75 tablet 0   Turmeric 500 MG CAPS Take 500 mg by mouth daily.     Insulin Pen Needle 32G X 6 MM MISC USE AS DIRECTED 100 each 99   No current facility-administered medications for this visit.     ALLERGIES: Patient has no known allergies.  Family History  Problem Relation Age of Onset   Diabetes Mother    Cancer Mother    Diabetes Father    Colon polyps Sister    Hypertension Sister    Colon polyps Sister    Hypertension Sister    Esophageal cancer Maternal Aunt    Hypertension Daughter    Stroke Daughter    Hypertension Daughter    Hypertension Daughter    Hypertension Daughter    Bipolar disorder Son    Alcohol abuse Son    Drug abuse Son    Breast cancer Cousin    Cancer Other        breast cancer died in her 57s   Colon cancer Neg Hx    Stomach cancer Neg Hx    Rectal cancer Neg Hx     Social History   Socioeconomic History   Marital status: Married    Spouse name: Not on file   Number of children: Not on file   Years of education: 57   Highest education level: Not on file  Occupational History   Not on file  Tobacco Use   Smoking status: Former    Packs/day: 1.00    Years: 25.00    Pack years: 25.00    Types: Cigarettes    Quit date: 10/26/2005    Years since quitting: 15.6   Smokeless tobacco: Never  Vaping Use   Vaping Use: Never used  Substance and Sexual Activity   Alcohol use: No    Alcohol/week: 0.0 standard drinks    Drug use: No   Sexual  activity: Not Currently    Partners: Male    Birth control/protection: Abstinence  Other Topics Concern   Not on file  Social History Narrative   Current Social History 11/14/2020        Patient lives with spouse in a home which is 1 story. There are steps up to the entrance with a handrail which the patient uses.       Patient's method of transportation is personal car.      The highest level of education was some high school, 11th grade.      The patient currently disabled.      Identified important Relationships are "my husband"       Pets : a dog, Research scientist (medical) / Fun: "Nothing since Covid"       Current Stressors: None       Religious / Personal Beliefs: Non-Christian       Other: None    Social Determinants of Health   Financial Resource Strain: Not on file  Food Insecurity: Not on file  Transportation Needs: Not on file  Physical Activity: Not on file  Stress: Not on file  Social Connections: Not on file  Intimate Partner Violence: Not on file    Review of Systems  All other systems reviewed and are negative.  PHYSICAL EXAMINATION:    BP (!) 150/100 (BP Location: Right Arm, Patient Position: Sitting, Cuff Size: Large)   Pulse 96   Resp 14   Ht $R'5\' 2"'Jl$  (1.575 m)   Wt 210 lb (95.3 kg)   LMP 01/05/2009   BMI 38.41 kg/m     General appearance: alert, cooperative and appears stated age  Pelvic: External genitalia:  no lesions              Urethra:  normal appearing urethra with no masses, tenderness or lesions              Bartholins and Skenes: normal                 Vagina: normal appearing vagina with normal color and discharge, no lesions              Cervix: no lesions and healing well               Chaperone was present for exam.  1. History of loop electrical excision procedure (LEEP) Healing well, + margins Plan colposcopy with pap and ECC in 1/23.   2. High grade squamous intraepithelial cervical dysplasia S/P  loop cone with ECC. + deep margin and ECC. Cauterized beyond the loop cone. As above, she will return for colposcopy, pap and ECC in 1/23

## 2021-06-16 ENCOUNTER — Other Ambulatory Visit: Payer: Self-pay | Admitting: Internal Medicine

## 2021-06-16 DIAGNOSIS — F1190 Opioid use, unspecified, uncomplicated: Secondary | ICD-10-CM

## 2021-06-22 ENCOUNTER — Other Ambulatory Visit: Payer: Self-pay | Admitting: Internal Medicine

## 2021-06-23 ENCOUNTER — Other Ambulatory Visit: Payer: Self-pay | Admitting: Internal Medicine

## 2021-06-23 ENCOUNTER — Other Ambulatory Visit (HOSPITAL_COMMUNITY): Payer: Self-pay

## 2021-06-23 MED ORDER — UNIFINE PENTIPS 31G X 6 MM MISC
99 refills | Status: DC
  Filled 2021-06-23: qty 100, 90d supply, fill #0
  Filled 2021-09-29: qty 100, 90d supply, fill #1

## 2021-07-14 ENCOUNTER — Other Ambulatory Visit: Payer: Self-pay | Admitting: Internal Medicine

## 2021-07-14 ENCOUNTER — Telehealth: Payer: Self-pay

## 2021-07-14 DIAGNOSIS — F1190 Opioid use, unspecified, uncomplicated: Secondary | ICD-10-CM

## 2021-07-14 NOTE — Telephone Encounter (Signed)
Pt requested refills for pap medication xultophy. Will call pt back once refills are delivered to office

## 2021-07-14 NOTE — Telephone Encounter (Signed)
Please call pt back about getting insulin order.

## 2021-07-17 ENCOUNTER — Other Ambulatory Visit (HOSPITAL_COMMUNITY): Payer: Self-pay

## 2021-07-17 MED ORDER — CARVEDILOL 12.5 MG PO TABS
12.5000 mg | ORAL_TABLET | Freq: Two times a day (BID) | ORAL | 0 refills | Status: DC
  Filled 2021-07-17: qty 60, 30d supply, fill #0

## 2021-07-17 MED ORDER — LOSARTAN POTASSIUM-HCTZ 100-25 MG PO TABS
1.0000 | ORAL_TABLET | Freq: Every day | ORAL | 0 refills | Status: DC
  Filled 2021-07-17: qty 30, 30d supply, fill #0

## 2021-07-17 NOTE — Telephone Encounter (Signed)
Dr Marianna Payment can you resend Tramadol rx electronically.  Thanks

## 2021-07-17 NOTE — Addendum Note (Signed)
Addended by: Ebbie Latus on: 07/17/2021 02:11 PM   Modules accepted: Orders

## 2021-07-18 ENCOUNTER — Other Ambulatory Visit: Payer: Self-pay | Admitting: Internal Medicine

## 2021-07-18 DIAGNOSIS — F1190 Opioid use, unspecified, uncomplicated: Secondary | ICD-10-CM

## 2021-07-18 NOTE — Telephone Encounter (Signed)
Tramadol rx was refillde om 9/22 but as "Print".  Re-send electronically.

## 2021-07-21 NOTE — Telephone Encounter (Signed)
Tramadol was refilled on 9/22 but as "Print". Please re-send electronically. Thanks

## 2021-08-04 ENCOUNTER — Telehealth: Payer: Self-pay

## 2021-08-04 NOTE — Telephone Encounter (Signed)
Left vm with daughter in regards to mothers refills on patient assistance medication - gave call back [phone]

## 2021-08-05 DIAGNOSIS — I129 Hypertensive chronic kidney disease with stage 1 through stage 4 chronic kidney disease, or unspecified chronic kidney disease: Secondary | ICD-10-CM | POA: Diagnosis not present

## 2021-08-05 DIAGNOSIS — E1129 Type 2 diabetes mellitus with other diabetic kidney complication: Secondary | ICD-10-CM | POA: Diagnosis not present

## 2021-08-05 DIAGNOSIS — Z23 Encounter for immunization: Secondary | ICD-10-CM | POA: Diagnosis not present

## 2021-08-05 DIAGNOSIS — N189 Chronic kidney disease, unspecified: Secondary | ICD-10-CM | POA: Diagnosis not present

## 2021-08-05 DIAGNOSIS — I701 Atherosclerosis of renal artery: Secondary | ICD-10-CM | POA: Diagnosis not present

## 2021-08-05 DIAGNOSIS — N183 Chronic kidney disease, stage 3 unspecified: Secondary | ICD-10-CM | POA: Diagnosis not present

## 2021-08-05 DIAGNOSIS — E1122 Type 2 diabetes mellitus with diabetic chronic kidney disease: Secondary | ICD-10-CM | POA: Diagnosis not present

## 2021-08-11 ENCOUNTER — Other Ambulatory Visit (HOSPITAL_COMMUNITY): Payer: Self-pay

## 2021-08-12 ENCOUNTER — Other Ambulatory Visit: Payer: Self-pay | Admitting: Internal Medicine

## 2021-08-12 MED FILL — Atorvastatin Calcium Tab 40 MG (Base Equivalent): ORAL | 90 days supply | Qty: 90 | Fill #1 | Status: AC

## 2021-08-13 ENCOUNTER — Other Ambulatory Visit (HOSPITAL_COMMUNITY): Payer: Self-pay

## 2021-08-13 ENCOUNTER — Other Ambulatory Visit: Payer: Self-pay | Admitting: Internal Medicine

## 2021-08-14 ENCOUNTER — Other Ambulatory Visit (HOSPITAL_COMMUNITY): Payer: Self-pay

## 2021-08-16 ENCOUNTER — Other Ambulatory Visit: Payer: Self-pay | Admitting: Student in an Organized Health Care Education/Training Program

## 2021-08-16 DIAGNOSIS — F1190 Opioid use, unspecified, uncomplicated: Secondary | ICD-10-CM

## 2021-08-19 ENCOUNTER — Other Ambulatory Visit (HOSPITAL_COMMUNITY): Payer: Self-pay

## 2021-08-19 ENCOUNTER — Telehealth: Payer: Self-pay

## 2021-08-19 MED ORDER — LOSARTAN POTASSIUM-HCTZ 100-25 MG PO TABS
1.0000 | ORAL_TABLET | Freq: Every day | ORAL | 0 refills | Status: DC
  Filled 2021-08-19: qty 30, 30d supply, fill #0

## 2021-08-19 NOTE — Telephone Encounter (Signed)
Pt called and informed rx was refilled today.

## 2021-08-19 NOTE — Telephone Encounter (Signed)
traMADol (ULTRAM) 50 MG tablet, refill request @ [place] ([place]), [place] - [address]

## 2021-09-04 ENCOUNTER — Ambulatory Visit (INDEPENDENT_AMBULATORY_CARE_PROVIDER_SITE_OTHER): Payer: Medicare HMO | Admitting: Internal Medicine

## 2021-09-04 ENCOUNTER — Encounter: Payer: Self-pay | Admitting: Internal Medicine

## 2021-09-04 DIAGNOSIS — R87613 High grade squamous intraepithelial lesion on cytologic smear of cervix (HGSIL): Secondary | ICD-10-CM | POA: Diagnosis not present

## 2021-09-04 DIAGNOSIS — N189 Chronic kidney disease, unspecified: Secondary | ICD-10-CM

## 2021-09-04 DIAGNOSIS — M1712 Unilateral primary osteoarthritis, left knee: Secondary | ICD-10-CM | POA: Diagnosis not present

## 2021-09-04 DIAGNOSIS — M170 Bilateral primary osteoarthritis of knee: Secondary | ICD-10-CM

## 2021-09-04 NOTE — Progress Notes (Addendum)
Subjective:  CC: left knee pain  HPI:  Kristy [name] is a 65 y.o. female with a past medical history stated below and presents today for left knee pain.  She has known degenerative joint disease in her left knee, and states that following walking around [place] on Monday she had significantly worsening of her pain.  She has been unable to walk on her knee since Monday and has been using wheelchair at home.  Please see problem based assessment and plan for additional details.  Past Medical History:  Diagnosis Date   Colon polyps    Colon polyps    Diabetes mellitus    Diverticulosis    Hyperlipidemia    Hypertension    Pneumonia due to COVID-19 virus 11/24/2020   Renal artery stenosis ([place]) 2007    status post selective bilateral renal angiography, balloon angiopathy of the left renal artery, first diagnosed in 2007 based on MRA of the renal arteries which 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 Albertine Patricia   Sleep apnea 2007    status post polysomnogram 2007 , suggested use of CPAP   Ventricular hypertrophy 2006    a 2-D echo in 2006, ejection fraction 55%, mild tricuspid regurgitation noted as well, 2-D echo November 13, 9700 showed diastolic dysfunction with LVEF normal of 65%    Current Outpatient Medications on File Prior to Visit  Medication Sig Dispense Refill   Accu-Chek FastClix Lancets MISC Check blood sugar two times a day (Patient taking differently: 1 each by Other route in the morning and at bedtime.) 102 each 5   Ascorbic Acid (VITAMIN C) 1000 MG tablet Take 1,000 mg by mouth daily.     aspirin 81 MG EC tablet Take 81 mg by mouth daily.     atorvastatin (LIPITOR) 40 MG tablet TAKE 1 TABLET (40 MG TOTAL) BY MOUTH DAILY. 90 tablet 3   Blood Glucose Monitoring Suppl (ACCU-CHEK GUIDE) w/Device KIT 1 each by Does not apply route 2 (two) times daily. 1 kit 1   carvedilol (COREG) 12.5 MG tablet Take 1 tablet (12.5 mg total) by mouth 2 (two) times daily. 60  tablet 0   glucose 4 GM chewable tablet Chew 1 tablet by mouth as needed for low blood sugar.     glucose blood (ACCU-CHEK GUIDE) test strip Check blood sugar two times a day (Patient taking differently: 1 each by Other route in the morning and at bedtime.) 100 each 5   Insulin Degludec-Liraglutide (XULTOPHY) 100-3.6 UNIT-MG/ML SOPN INJECT 40 UNITS INTO THE SKIN DAILY. 15 mL 5   Insulin Pen Needle (B-D UF III MINI PEN NEEDLES) 31G X 5 MM MISC 1 Device by Does not apply route 3 (three) times daily before meals. Check daily 3X before meals 100 each 12   Insulin Pen Needle (UNIFINE PENTIPS) 31G X 6 MM MISC See admin instructions. 100 each PRN   Insulin Syringe-Needle U-100 (INSULIN SYRINGE .5CC/31GX5/16") 31G X 5/16" 0.5 ML MISC USE AS DIRECTED ONCE DAILY. 100 each 3   losartan-hydrochlorothiazide (HYZAAR) 100-25 MG tablet Take 1 tablet by mouth daily. 30 tablet 0   Multiple Vitamin (MULTIVITAMIN) tablet Take 1 tablet by mouth daily.     Na Sulfate-K Sulfate-Mg Sulf 17.5-3.13-1.6 GM/177ML SOLN TAKE AS DIRECTED. 354 mL 0   Omega-3 Fatty Acids (FISH OIL) 1000 MG CAPS Take 1,000 mg by mouth daily.     spironolactone (ALDACTONE) 50 MG tablet Take 1 tablet by mouth once daily 90 tablet 0  traMADol (ULTRAM) 50 MG tablet TAKE 2 TABLETS BY MOUTH EVERY 6 HOURS AS NEEDED FOR SEVERE PAIN 75 tablet 0   Turmeric 500 MG CAPS Take 500 mg by mouth daily.     No current facility-administered medications on file prior to visit.    Family History  Problem Relation Age of Onset   Diabetes Mother    Cancer Mother    Diabetes Father    Colon polyps Sister    Hypertension Sister    Colon polyps Sister    Hypertension Sister    Esophageal cancer Maternal Aunt    Hypertension Daughter    Stroke Daughter    Hypertension Daughter    Hypertension Daughter    Hypertension Daughter    Bipolar disorder Son    Alcohol abuse Son    Drug abuse Son    Breast cancer Cousin    Cancer Other        breast cancer died  in her 68s   Colon cancer Neg Hx    Stomach cancer Neg Hx    Rectal cancer Neg Hx     Social History   Socioeconomic History   Marital status: Married    Spouse name: Not on file   Number of children: Not on file   Years of education: 33   Highest education level: Not on file  Occupational History   Not on file  Tobacco Use   Smoking status: Former    Packs/day: 1.00    Years: 25.00    Pack years: 25.00    Types: Cigarettes    Quit date: 10/26/2005    Years since quitting: 15.8   Smokeless tobacco: Never  Vaping Use   Vaping Use: Never used  Substance and Sexual Activity   Alcohol use: No    Alcohol/week: 0.0 standard drinks   Drug use: No   Sexual activity: Not Currently    Partners: Male    Birth control/protection: Abstinence  Other Topics Concern   Not on file  Social History Narrative   Current Social History 11/14/2020        Patient lives with spouse in a home which is 1 story. There are steps up to the entrance with a handrail which the patient uses.       Patient's method of transportation is personal car.      The highest level of education was some high school, 11th grade.      The patient currently disabled.      Identified important Relationships are "my husband"       Pets : a dog, Research scientist (medical) / Fun: "Nothing since Covid"       Current Stressors: None       Religious / Personal Beliefs: Non-Christian       Other: None    Social Determinants of Health   Financial Resource Strain: Not on file  Food Insecurity: Not on file  Transportation Needs: Not on file  Physical Activity: Not on file  Stress: Not on file  Social Connections: Not on file  Intimate Partner Violence: Not on file    Review of Systems: ROS negative except for what is noted on the assessment and plan.  Objective:   Vitals:   09/04/21 1604  BP: (!) 159/100  Pulse: 96  SpO2: 100%    Physical Exam: Gen: A&O x3 and in no apparent distress, patient is  sitting on wheelchair HEENT:    Head -  normocephalic, atraumatic.    Eye - visual acuity grossly intact, conjunctiva clear, sclera non-icteric, EOM intact.    Mouth - No obvious caries or periodontal disease. Neck: no masses or nodules, AROM intact. CV: RRR, no murmurs, S1/S2 presents  Resp: Clear to ascultation bilaterally  Abd: BS (+) x4, soft, non-tender abdomen, without hepatosplenomegaly or masses MSK: No erythema, effusion of the left knee, reduced range of motion in flexion and extension of left knee, crepitus with movement of left knee Skin: good skin turgor, no rashes, unusual bruising, or prominent lesions.  Neuro: No focal deficits, grossly normal sensation and coordination.  Psych: Oriented x3 and responding appropriately. Intact memory, normal mood, judgement, affect, and insight.    Assessment & Plan:  See Encounters Tab for problem based charting.  PROCEDURE NOTE  PROCEDURE: left knee joint steroid injection.  PREOPERATIVE DIAGNOSIS: Osteoarthritis of the left knee.  POSTOPERATIVE DIAGNOSIS: Osteoarthritis of the left knee.  PROCEDURE: The patient was apprised of the risks and the benefits of the procedure and informed consent was obtained, as witnessed by Dr. Heber [place]. Time-out procedure was performed, with confirmation of the patient's name, date of birth, and correct identification of the left knee to be injected. The patient's knee was then marked at the appropriate site for injection placement. The knee was sterilely prepped with Betadine. A 40 mg solution of Kenalog was drawn up into a 5 mL syringe with a 2 mL of 1% lidocaine. The patient was injected with a 25-gauge needle at the anterolateral aspect of her left flexed knee. There were no complications. The patient tolerated the procedure well. There was minimal bleeding. The patient was instructed to ice her knee upon leaving clinic and refrain from overuse over the next 3 days. The patient was instructed to go to the  emergency room with any usual pain, swelling, or redness occurred in the injected area. The patient was given a followup appointment to evaluate response to the injection to his increased range of motion and reduction of pain.  The procedure was supervised by attending physician, Dr. Heber [place].   Patient seen with Dr. Juluis Rainier Ashleah Valtierra, D.O. [facility]  PGY-1 Pager: [phone]  Phone: [phone] Date 09/05/2021  Time 6:48 AM

## 2021-09-04 NOTE — Patient Instructions (Addendum)
[place], it was a pleasure seeing you today!  Today we discussed: Knee pain We injected steroid and pain medication into your knee.  Hopefully this will give you some relief.  If you have worsening pain, develop fever, or see redness at your knee please.  You can also try Voltaren gel to see if that helps with your pain.  Please follow-up in four weeks for check up on diabetes and high blood pressure.  I have ordered the following labs today:  Lab Orders  No laboratory test(s) ordered today     Referrals ordered today:   Referral Orders  No referral(s) requested today     I have ordered the following medication/changed the following medications:   Stop the following medications: There are no discontinued medications.   Start the following medications: No orders of the defined types were placed in this encounter.    Follow-up:  4 weeks    Please make sure to arrive 15 minutes prior to your next appointment. If you arrive late, you may be asked to reschedule.   We look forward to seeing you next time. Please call our clinic at [phone] if you have any questions or concerns. The best time to call is Monday-Friday from 9am-4pm, but there is someone available 24/7. If after hours or the weekend, call the main hospital number and ask for the Internal Medicine Resident On-Call. If you need medication refills, please notify your pharmacy one week in advance and they will send [place] a request.  Thank you for letting us take part in your care. Wishing you the best!  Thank you, Dr. Heloise [facility]

## 2021-09-05 NOTE — Assessment & Plan Note (Signed)
LEEP procedure completed in July 2022, but surgical pathology at that time with positive margins.  On chart review plans for colposcopy with Pap and ECC in January 2023.  Patient reports that she is aware of the need for this follow-up.

## 2021-09-05 NOTE — Assessment & Plan Note (Addendum)
Kristy [name] presents due to worsened left knee pain.  She states that she walks around [place] on Monday of this week and when she got home she was unable to put weight on her knee without severe pain.  Did not fall and that they like to more pain. She has been taking Tylenol 650 mg twice a day and tramadol 50 mg twice a day, but is limited by which medication she can take due to her chronic kidney disease.  She was seen by an orthopedist in January 2022.  X-ray of the left knee showed a probable osteochondroma on proximal medial tibia and end-stage medial compartment degenerative joint disease.  At that time they recommended over-the-counter remedies, steroid injections, and to leave knee replacement as a last resort.  She has not had a steroid injection in her left knee and would like one today.  On physical exam, her left knee has no evidence of effusion, or redness.  She is limited in flexion and extension due to pain and there is crepitus along the joint line when she does bend her knee.  Plan: -Steroid injection to left knee -Will have patient follow-up in 4 weeks

## 2021-09-10 ENCOUNTER — Other Ambulatory Visit (HOSPITAL_COMMUNITY): Payer: Self-pay

## 2021-09-11 NOTE — Progress Notes (Signed)
Internal Medicine Clinic Attending  I saw and evaluated the patient.  I personally confirmed the key portions of the history and exam documented by Dr. Masters and I reviewed pertinent patient test results.  The assessment, diagnosis, and plan were formulated together and I agree with the documentation in the resident's note.  

## 2021-09-14 ENCOUNTER — Other Ambulatory Visit: Payer: Self-pay | Admitting: Internal Medicine

## 2021-09-14 DIAGNOSIS — F1190 Opioid use, unspecified, uncomplicated: Secondary | ICD-10-CM

## 2021-09-16 ENCOUNTER — Ambulatory Visit: Payer: Medicare HMO | Admitting: Orthopaedic Surgery

## 2021-09-16 ENCOUNTER — Ambulatory Visit: Payer: Self-pay

## 2021-09-16 ENCOUNTER — Other Ambulatory Visit: Payer: Self-pay

## 2021-09-16 ENCOUNTER — Encounter: Payer: Self-pay | Admitting: Orthopaedic Surgery

## 2021-09-16 VITALS — Ht 61.5 in | Wt 210.6 lb

## 2021-09-16 DIAGNOSIS — E1142 Type 2 diabetes mellitus with diabetic polyneuropathy: Secondary | ICD-10-CM

## 2021-09-16 DIAGNOSIS — M1712 Unilateral primary osteoarthritis, left knee: Secondary | ICD-10-CM | POA: Diagnosis not present

## 2021-09-16 NOTE — Progress Notes (Signed)
Office Visit Note   Patient: Kristy [name]           Date of Birth: 20-Sep-1956           MRN: [id number] Visit Date: 09/16/2021              Requested by: Marianna Payment, MD [address] [place],  [place] [address] PCP: Marianna Payment, MD   Assessment & Plan: Visit Diagnoses:  1. Primary osteoarthritis of left knee     Plan: Impression is left knee degenerative joint disease.  The patient has chronic left knee pain which has not improved with oral medications as well as steroid injections.  She is getting to the point where she is having a hard time walking due to the pain.  She would like to proceed with left total knee replacement.  Risk, benefits and poss medications reviewed.  Rehab recovery time discussed.  All questions were answered.  She is in stage IV renal failure so we will need to get clearance prior to surgery.  We will also obtain hemoglobin A1c due to her underlying diabetes and will obtain a prealbumin as well.  Total face to face encounter time was greater than 25 minutes and over half of this time was spent in counseling and/or coordination of care.  Follow-Up Instructions: Return for post-op.   Orders:  Orders Placed This Encounter  Procedures   XR KNEE 3 VIEW LEFT   Hemoglobin A1C   Prealbumin    No orders of the defined types were placed in this encounter.     Procedures: No procedures performed   Clinical Data: No additional findings.   Subjective: Chief Complaint  Patient presents with   Left Knee - Pain    HPI patient is a pleasant 65 year old female who comes in today with left knee pain.  She has been dealing with this for several years.  Initially, the pain was intermittent but has become more constant over the past several months.  He was seen by Dr. Junius Roads in January of this past year where cortisone injection was performed.  This gave her temporary relief.  She continues to endorse pain to the entire left knee which is worse  with walking and sleeping.  She denies any mechanical symptoms.  She has been taking tramadol and Tylenol without significant relief.  She was seen by her PCP last week for repeat cortisone injection was performed without relief.  Review of Systems as detailed in HPI.  All others reviewed and are negative.   Objective: Vital Signs: Ht 5' 1.5" (1.562 m)   Wt 210 lb 9.6 oz (95.5 kg)   LMP 01/05/2009   BMI 39.15 kg/m   Physical Exam well-developed well-nourished female no acute distress.  Alert and oriented x3.  Ortho Exam left knee exam shows a small effusion.  Range of motion 0 to 95 degrees.  Medial joint line tenderness.  Moderate patellofemoral crepitus.  Ligaments are stable.  She is neurovascular intact distally.  Specialty Comments:  No specialty comments available.  Imaging: Left knee x-rays reviewed by me in [place] show advanced degenerative changes the medial patellofemoral compartments   PMFS History: Patient Active Problem List   Diagnosis Date Noted   HSIL on Pap smear of cervix 04/04/2021   Anxiety 04/04/2021   AKI (acute kidney injury) ([place]) 11/23/2020   Lower extremity edema 06/08/2019   Primary osteoarthritis of left knee 07/29/2018   Obstructive sleep apnea 10/07/2017   Claudication (  [place]) 10/07/2017   CKD (chronic kidney disease) stage 4, GFR 15-29 ml/min ([place]) 06/04/2017   Hypercalcemia 06/26/2016   Vitamin D deficiency 12/07/2014   Moderate major depression ([place]) 08/09/2014   Healthcare maintenance 08/23/2013   Chronic, continuous use of opioids 03/14/2013   Renovascular hypertension secondary to Fibromuscular Dysplasia  12/20/2012   DM type 2 with diabetic peripheral neuropathy ([place]) 12/06/2012   Dyslipidemia 07/21/2006   Past Medical History:  Diagnosis Date   Colon polyps    Colon polyps    Diabetes mellitus    Diverticulosis    Hyperlipidemia    Hypertension    Pneumonia due to COVID-19 virus 11/24/2020   Renal artery stenosis ([place]) 2007     status post selective bilateral renal angiography, balloon angiopathy of the left renal artery, first diagnosed in 2007 based on MRA of the renal arteries which 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 Albertine Patricia   Sleep apnea 2007    status post polysomnogram 2007 , suggested use of CPAP   Ventricular hypertrophy 2006    a 2-D echo in 2006, ejection fraction 55%, mild tricuspid regurgitation noted as well, 2-D echo November 14, 7827 showed diastolic dysfunction with LVEF normal of 65%    Family History  Problem Relation Age of Onset   Diabetes Mother    Cancer Mother    Diabetes Father    Colon polyps Sister    Hypertension Sister    Colon polyps Sister    Hypertension Sister    Esophageal cancer Maternal Aunt    Hypertension Daughter    Stroke Daughter    Hypertension Daughter    Hypertension Daughter    Hypertension Daughter    Bipolar disorder Son    Alcohol abuse Son    Drug abuse Son    Breast cancer Cousin    Cancer Other        breast cancer died in her 65s   Colon cancer Neg Hx    Stomach cancer Neg Hx    Rectal cancer Neg Hx     Past Surgical History:  Procedure Laterality Date   BREAST BIOPSY Right 2016   COLONOSCOPY  02/03/2010   RENAL ARTERY STENT  2007    patient is status post renal artery stent placement May 2007 and that was done secondary to renal artery stenosis, fibromuscular dysplasia   REPLACEMENT TOTAL KNEE  02/2011-Dr. Ninfa Linden   S/P Right Total Knee Replacement May/2012   Social History   Occupational History   Not on file  Tobacco Use   Smoking status: Former    Packs/day: 1.00    Years: 25.00    Pack years: 25.00    Types: Cigarettes    Quit date: 10/26/2005    Years since quitting: 15.9   Smokeless tobacco: Never  Vaping Use   Vaping Use: Never used  Substance and Sexual Activity   Alcohol use: No    Alcohol/week: 0.0 standard drinks   Drug use: No   Sexual activity: Not Currently    Partners: Male    Birth  control/protection: Abstinence

## 2021-09-17 LAB — HEMOGLOBIN A1C
Hgb A1c MFr Bld: 7.1 % of total Hgb — ABNORMAL HIGH (ref <5.7)
Mean Plasma Glucose: 157 mg/dL
eAG (mmol/L): 8.7 mmol/L

## 2021-09-17 LAB — PREALBUMIN: Prealbumin: 38 mg/dL — ABNORMAL HIGH (ref 17–34)

## 2021-09-22 ENCOUNTER — Other Ambulatory Visit (HOSPITAL_COMMUNITY): Payer: Self-pay

## 2021-09-22 MED ORDER — INSULIN DEGLUDEC-LIRAGLUTIDE 100-3.6 UNIT-MG/ML ~~LOC~~ SOPN
40.0000 [IU] | PEN_INJECTOR | Freq: Every day | SUBCUTANEOUS | 5 refills | Status: DC
  Filled 2021-09-22: qty 15, 37d supply, fill #0

## 2021-09-22 NOTE — Telephone Encounter (Signed)
traMADol (ULTRAM) 50 MG tablet, refill request @ [place] ([place]), [place] - [address]  Insulin Degludec-Liraglutide (XULTOPHY) 100-3.6 UNIT-MG/ML SOPN, refill request @ [facility].

## 2021-09-22 NOTE — Telephone Encounter (Signed)
Called pt - informed Tramadol was refilled 11/24; stated she called the pharmacy yesterday and was told her doctor needed to do something.  I called [facility] - talked to [place]. She stated she needs to put in the doctor's DEA # again in which she did and rx went thru.  Called pt back - I informed of the above and to call the pharmacy prior going to the pharmacy to be sure the refill is ready.

## 2021-09-23 ENCOUNTER — Other Ambulatory Visit: Payer: Self-pay | Admitting: Internal Medicine

## 2021-09-23 ENCOUNTER — Other Ambulatory Visit (HOSPITAL_COMMUNITY): Payer: Self-pay

## 2021-09-23 DIAGNOSIS — E1142 Type 2 diabetes mellitus with diabetic polyneuropathy: Secondary | ICD-10-CM

## 2021-09-23 MED ORDER — LOSARTAN POTASSIUM-HCTZ 100-25 MG PO TABS
1.0000 | ORAL_TABLET | Freq: Every day | ORAL | 0 refills | Status: DC
  Filled 2021-09-23: qty 30, 30d supply, fill #0

## 2021-09-23 MED ORDER — INSULIN DEGLUDEC-LIRAGLUTIDE 100-3.6 UNIT-MG/ML ~~LOC~~ SOPN
40.0000 [IU] | PEN_INJECTOR | Freq: Every day | SUBCUTANEOUS | 5 refills | Status: DC
  Filled 2021-09-23: qty 12, 30d supply, fill #0
  Filled 2021-10-24: qty 12, 30d supply, fill #1
  Filled 2021-12-01 (×2): qty 12, 30d supply, fill #2
  Filled 2021-12-31: qty 12, 30d supply, fill #3
  Filled 2022-01-25: qty 12, 30d supply, fill #4
  Filled 2022-02-21: qty 12, 30d supply, fill #5
  Filled 2022-03-31: qty 12, 30d supply, fill #6

## 2021-09-23 MED ORDER — CARVEDILOL 12.5 MG PO TABS
12.5000 mg | ORAL_TABLET | Freq: Two times a day (BID) | ORAL | 0 refills | Status: DC
  Filled 2021-09-23: qty 60, 30d supply, fill #0

## 2021-09-29 ENCOUNTER — Other Ambulatory Visit (HOSPITAL_COMMUNITY): Payer: Self-pay

## 2021-10-01 ENCOUNTER — Other Ambulatory Visit (HOSPITAL_COMMUNITY): Payer: Self-pay

## 2021-10-20 ENCOUNTER — Other Ambulatory Visit: Payer: Self-pay | Admitting: Internal Medicine

## 2021-10-20 DIAGNOSIS — F1190 Opioid use, unspecified, uncomplicated: Secondary | ICD-10-CM

## 2021-10-22 NOTE — Telephone Encounter (Signed)
Last office visit 09/04/2021.  Last  [employer] 9/24/219

## 2021-10-24 ENCOUNTER — Other Ambulatory Visit (HOSPITAL_COMMUNITY): Payer: Self-pay

## 2021-10-27 ENCOUNTER — Other Ambulatory Visit (HOSPITAL_COMMUNITY): Payer: Self-pay

## 2021-10-28 NOTE — Telephone Encounter (Signed)
traMADol (ULTRAM) 50 MG tablet, REFILL REQUEST @ [facility] ([place]), [place] - [address]

## 2021-10-31 ENCOUNTER — Other Ambulatory Visit: Payer: Self-pay | Admitting: Student

## 2021-10-31 NOTE — Telephone Encounter (Signed)
Patient is calling to check on her refill on pain meds.  Please call patient back on home telephone [phone].

## 2021-11-11 ENCOUNTER — Other Ambulatory Visit: Payer: Self-pay | Admitting: Internal Medicine

## 2021-11-11 MED FILL — Atorvastatin Calcium Tab 40 MG (Base Equivalent): ORAL | 90 days supply | Qty: 90 | Fill #2 | Status: AC

## 2021-11-12 ENCOUNTER — Other Ambulatory Visit (HOSPITAL_COMMUNITY)
Admission: RE | Admit: 2021-11-12 | Discharge: 2021-11-12 | Disposition: A | Discharge status: Home / Self Care | Payer: Medicare HMO | Source: Ambulatory Visit | Attending: Obstetrics and Gynecology | Admitting: Obstetrics and Gynecology

## 2021-11-12 ENCOUNTER — Other Ambulatory Visit (HOSPITAL_COMMUNITY): Payer: Self-pay

## 2021-11-12 ENCOUNTER — Ambulatory Visit (INDEPENDENT_AMBULATORY_CARE_PROVIDER_SITE_OTHER): Payer: Medicare HMO | Admitting: Obstetrics and Gynecology

## 2021-11-12 ENCOUNTER — Other Ambulatory Visit: Payer: Self-pay

## 2021-11-12 ENCOUNTER — Encounter: Payer: Self-pay | Admitting: Obstetrics and Gynecology

## 2021-11-12 VITALS — BP 140/82 | HR 87 | Ht 61.0 in

## 2021-11-12 DIAGNOSIS — Z1151 Encounter for screening for human papillomavirus (HPV): Secondary | ICD-10-CM | POA: Insufficient documentation

## 2021-11-12 DIAGNOSIS — N939 Abnormal uterine and vaginal bleeding, unspecified: Secondary | ICD-10-CM

## 2021-11-12 DIAGNOSIS — R9389 Abnormal findings on diagnostic imaging of other specified body structures: Secondary | ICD-10-CM | POA: Insufficient documentation

## 2021-11-12 DIAGNOSIS — Z87410 Personal history of cervical dysplasia: Secondary | ICD-10-CM | POA: Insufficient documentation

## 2021-11-12 DIAGNOSIS — Z01419 Encounter for gynecological examination (general) (routine) without abnormal findings: Secondary | ICD-10-CM | POA: Diagnosis not present

## 2021-11-12 DIAGNOSIS — N882 Stricture and stenosis of cervix uteri: Secondary | ICD-10-CM | POA: Diagnosis not present

## 2021-11-12 DIAGNOSIS — Z124 Encounter for screening for malignant neoplasm of cervix: Secondary | ICD-10-CM | POA: Insufficient documentation

## 2021-11-12 DIAGNOSIS — R87612 Low grade squamous intraepithelial lesion on cytologic smear of cervix (LGSIL): Secondary | ICD-10-CM | POA: Insufficient documentation

## 2021-11-12 DIAGNOSIS — N72 Inflammatory disease of cervix uteri: Secondary | ICD-10-CM | POA: Diagnosis not present

## 2021-11-12 DIAGNOSIS — N870 Mild cervical dysplasia: Secondary | ICD-10-CM | POA: Diagnosis not present

## 2021-11-12 MED ORDER — CARVEDILOL 12.5 MG PO TABS
12.5000 mg | ORAL_TABLET | Freq: Two times a day (BID) | ORAL | 0 refills | Status: DC
  Filled 2021-11-12: qty 60, 30d supply, fill #0

## 2021-11-12 MED ORDER — LOSARTAN POTASSIUM-HCTZ 100-25 MG PO TABS
1.0000 | ORAL_TABLET | Freq: Every day | ORAL | 0 refills | Status: DC
  Filled 2021-11-12: qty 30, 30d supply, fill #0

## 2021-11-12 NOTE — Progress Notes (Signed)
GYNECOLOGY  VISIT   HPI: 66 y.o.   Married Black or [place] American Not Hispanic or Latino  female   [id number] with Patient's last menstrual period was 01/05/2009.   here for colposcopy.    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 The cervix was cauterized beyond the edge of the leep biopsy.   She is without c/o, denies vaginal bleeding and denies pelvic pain.  GYNECOLOGIC HISTORY: Patient's last menstrual period was 01/05/2009. Contraception:PMP Menopausal hormone therapy: no        OB History     Gravida  6   Para  6   Term      Preterm      AB      Living  6      SAB      IAB      Ectopic      Multiple      Live Births                 Patient Active Problem List   Diagnosis Date Noted   HSIL on Pap smear of cervix 04/04/2021   Anxiety 04/04/2021   AKI (acute kidney injury) ([place]) 11/23/2020   Lower extremity edema 06/08/2019   Primary osteoarthritis of left knee 07/29/2018   Obstructive sleep apnea 10/07/2017   Claudication ([place]) 10/07/2017   CKD (chronic kidney disease) stage 4, GFR 15-29 ml/min ([place]) 06/04/2017   Hypercalcemia 06/26/2016   Vitamin D deficiency 12/07/2014   Moderate major depression ([place]) 08/09/2014   Healthcare maintenance 08/23/2013   Chronic, continuous use of opioids 03/14/2013   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 Albertine Patricia   Sleep apnea 2007    status post polysomnogram 2007 , suggested use of CPAP   Ventricular hypertrophy 2006    a 2-D echo in 2006, ejection fraction 55%, mild tricuspid regurgitation noted as well, 2-D echo November 13, 348 showed diastolic dysfunction with LVEF normal of 65%    Past Surgical History:  Procedure Laterality Date   BREAST BIOPSY Right 2016   COLONOSCOPY  02/03/2010   RENAL ARTERY STENT  2007    patient is status post renal artery stent placement May 2007 and that was done secondary to renal artery stenosis, fibromuscular dysplasia   REPLACEMENT TOTAL KNEE  02/2011-Dr. 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     aspirin 81 MG EC tablet Take 81 mg by mouth daily.     atorvastatin (LIPITOR) 40  MG tablet TAKE 1 TABLET (40 MG TOTAL) BY MOUTH DAILY. 90 tablet 3   Blood Glucose Monitoring Suppl (ACCU-CHEK GUIDE) w/Device KIT 1 each by Does not apply route 2 (two) times daily. 1 kit 1   carvedilol (COREG) 12.5 MG tablet Take 1 tablet (12.5 mg total) by mouth 2 (two) times daily. 60 tablet 0   glucose 4 GM chewable tablet Chew 1 tablet by mouth as needed for low blood sugar.     glucose blood (ACCU-CHEK GUIDE) test strip Check blood sugar two times a day (Patient taking differently: 1 each by Other route in the morning and at bedtime.) 100 each 5   Insulin Degludec-Liraglutide (XULTOPHY) 100-3.6 UNIT-MG/ML SOPN Inject 40 Units into the skin daily. 15 mL 5   Insulin Pen Needle (B-D UF III MINI PEN NEEDLES) 31G X 5 MM MISC 1 Device by Does not apply route 3 (three) times daily before meals. Check daily 3X before meals 100  each 12   Insulin Pen Needle (UNIFINE PENTIPS) 31G X 6 MM MISC See admin instructions. 100 each PRN   Insulin Syringe-Needle U-100 (INSULIN SYRINGE .5CC/31GX5/16") 31G X 5/16" 0.5 ML MISC USE AS DIRECTED ONCE DAILY. 100 each 3   losartan-hydrochlorothiazide (HYZAAR) 100-25 MG tablet Take 1 tablet by mouth daily. 30 tablet 0   Multiple Vitamin (MULTIVITAMIN) tablet Take 1 tablet by mouth daily.     Na Sulfate-K Sulfate-Mg Sulf 17.5-3.13-1.6 GM/177ML SOLN TAKE AS DIRECTED. 354 mL 0   Omega-3 Fatty Acids (FISH OIL) 1000 MG CAPS Take 1,000 mg by mouth daily.     spironolactone (ALDACTONE) 50 MG tablet Take 1 tablet by mouth once daily 90 tablet 0   traMADol (ULTRAM) 50 MG tablet TAKE 2 TABLETS BY MOUTH EVERY 6 HOURS AS NEEDED FOR SEVERE PAIN 75 tablet 0   Turmeric 500 MG CAPS Take 500 mg by mouth daily.     No current facility-administered medications for this visit.     ALLERGIES: Patient has no known allergies.  Family History  Problem Relation Age of Onset   Diabetes Mother    Cancer Mother    Diabetes Father    Colon polyps Sister    Hypertension Sister    Colon polyps Sister    Hypertension Sister    Esophageal cancer Maternal Aunt    Hypertension Daughter    Stroke Daughter    Hypertension Daughter    Hypertension Daughter    Hypertension Daughter    Bipolar disorder Son    Alcohol abuse Son    Drug abuse Son    Breast cancer Cousin    Cancer Other        breast cancer died in her 60s   Colon cancer Neg Hx    Stomach cancer Neg Hx    Rectal cancer Neg Hx     Social History   Socioeconomic History   Marital status: Married    Spouse name: Not on file   Number of children: Not on file   Years of education: 11   Highest education level: Not on file  Occupational History   Not on file  Tobacco Use   Smoking status: Former    Packs/day: 1.00    Years: 25.00    Pack years: 25.00    Types: Cigarettes    Quit date: 10/26/2005    Years since quitting: 16.0    Smokeless tobacco: Never  Vaping Use   Vaping Use: Never used  Substance and Sexual Activity  Alcohol use: No    Alcohol/week: 0.0 standard drinks   Drug use: No   Sexual activity: Not Currently    Partners: Male    Birth control/protection: Abstinence  Other Topics Concern   Not on file  Social History Narrative   Current Social History 11/14/2020        Patient lives with spouse in a home which is 1 story. There are steps up to the entrance with a handrail which the patient uses.       Patient's method of transportation is personal car.      The highest level of education was some high school, 11th grade.      The patient currently disabled.      Identified important Relationships are "my husband"       Pets : a dog, Research scientist (medical) / Fun: "Nothing since Covid"       Current Stressors: None       Religious / Personal Beliefs: Non-Christian       Other: None    Social Determinants of Radio broadcast assistant Strain: Not on file  Food Insecurity: Not on file  Transportation Needs: Not on file  Physical Activity: Not on file  Stress: Not on file  Social Connections: Not on file  Intimate Partner Violence: Not on file    ROS  PHYSICAL EXAMINATION:    LMP 01/05/2009     General appearance: alert, cooperative and appears stated age   Pelvic: External genitalia:  no lesions              Urethra:  normal appearing urethra with no masses, tenderness or lesions              Bartholins and Skenes: normal                 Vagina: normal appearing vagina with normal color and discharge, no lesions              Cervix: no lesions and completely stenotic              Bimanual Exam:  Uterus:   no masses or tenderness              Adnexa: no mass, fullness, tenderness               Colposcopy: unsatisfactory, cervix extremely stenotic. Unable to pass the smallest dilator. No aceto-white changes. Decreased lugols uptake at 6 o'clock, biopsy done. A tenaculum was  needed in order to get the above biopsy. Local with 1% lidocaine with epinephrine was placed around the cervical os and the os was opened with a #11 blade. With opening the os a large amount of brown mucous d/c was expelled (it filled the vagina several times). The ECC was obtained. Given the concerning brown mucous the decision was made to do an ultrasound.  Limited ultrasound: Uterus 9.03 x 3.8 x 5.61 cm, anteverted Endometrium stripe 2.4 cm, mass seen in the endometrium as well as a small amount of fluid.  The cervix is dilated and filled with fluid  After the ultrasound, the decision was made to do an endometrial biopsy. The risks of endometrial biopsy were reviewed and a consent was obtained.  A speculum was placed in the vagina and the cervix was cleansed with betadine. A tenaculum was placed on the cervix and the uterine evacuator was placed into the endometrial cavity. The uterus sounded to over 10 cm.  The endometrial biopsy was performed, taking care to get a representative sample, sampling 360 degrees of the uterine cavity. A large amount of brown mucous was obtained. The tenaculum and speculum were removed. There were no complications.    Chaperone was present for exam.  1. History of cervical dysplasia S/p leep 6 months ago, + margins - Colposcopy - Surgical pathology( [place]/ [facility]) - Cytology - PAP  2. Cervical stenosis (uterine cervix) Needed cervical dilation  3. Uterine bleeding Large amount of old blood/mucous was expelled with dilation of her cervix - Surgical pathology( [place])  4. Thickened endometrium - Surgical pathology( [place]/ [facility])  5. Screening for cervical cancer - Cytology - PAP

## 2021-11-14 LAB — CYTOLOGY - PAP
Comment: NEGATIVE
High risk HPV: POSITIVE — AB

## 2021-11-17 LAB — SURGICAL PATHOLOGY

## 2021-11-19 ENCOUNTER — Telehealth: Payer: Self-pay

## 2021-11-19 ENCOUNTER — Other Ambulatory Visit: Payer: Self-pay

## 2021-11-19 DIAGNOSIS — N8502 Endometrial intraepithelial neoplasia [EIN]: Secondary | ICD-10-CM

## 2021-11-19 DIAGNOSIS — R9389 Abnormal findings on diagnostic imaging of other specified body structures: Secondary | ICD-10-CM

## 2021-11-19 NOTE — Telephone Encounter (Signed)
Referral order placed in Epic. 

## 2021-11-19 NOTE — Telephone Encounter (Signed)
Given the patient's abnormal limited ultrasound last week with an endometrial stripe of 2.4 cm, and atypical endometrial hyperplasia, I will refer her to Dr Berline Lopes to decide if she needs further sampling prior to hysterectomy.   Please place referral to Dr Berline Lopes

## 2021-11-20 ENCOUNTER — Other Ambulatory Visit: Payer: Self-pay | Admitting: Obstetrics and Gynecology

## 2021-11-20 ENCOUNTER — Ambulatory Visit (INDEPENDENT_AMBULATORY_CARE_PROVIDER_SITE_OTHER): Payer: Medicare HMO

## 2021-11-20 DIAGNOSIS — N939 Abnormal uterine and vaginal bleeding, unspecified: Secondary | ICD-10-CM

## 2021-11-21 ENCOUNTER — Other Ambulatory Visit: Payer: Self-pay

## 2021-11-24 ENCOUNTER — Telehealth: Payer: Self-pay | Admitting: Internal Medicine

## 2021-11-24 NOTE — Telephone Encounter (Signed)
Pt requesting a call back about getting a manual wheelchair.

## 2021-11-26 ENCOUNTER — Telehealth: Payer: Self-pay | Admitting: *Deleted

## 2021-11-26 ENCOUNTER — Other Ambulatory Visit: Payer: Self-pay | Admitting: Student

## 2021-11-26 DIAGNOSIS — F1190 Opioid use, unspecified, uncomplicated: Secondary | ICD-10-CM

## 2021-11-26 NOTE — Telephone Encounter (Signed)
Spoke with the patient and her daughter regarding scheduling a new patient appt. Patient scheduled with Dr Berline Lopes on 2/20 at 11:15 am. Patient and daughter given the address and phone number for the clinic; along with the policy for mask and visitors. Patient offered earlier appt date but declined

## 2021-11-26 NOTE — Telephone Encounter (Signed)
Appt scheduled at 12/15/21 at 11:15am with Dr. Berline Lopes.

## 2021-11-26 NOTE — Telephone Encounter (Signed)
I called patient back concerning wheelchair. The patient is going to have knee surgery in March of this year as well as a Hysterectomy. The patient has a walker but she does not want to use it. The patient said she may just try to rent or go [facility] to get one.I told the patient that once she has her surgery Pt will work with her and they can order what she needs.I will also send this to her PCP [place], [place] C2/1/202310:07 AM

## 2021-11-26 NOTE — Telephone Encounter (Signed)
Last refill - 10/31/21.

## 2021-11-27 NOTE — Telephone Encounter (Signed)
Patient received a refill on this medication only 3 weeks ago. Additionally, she does not have a pain contract on file. She will need a follow up appointment to discuss chronic pain management and sign a pain contract and drug screen. I will fill this prescription so that she will be able to get it after 30 days from her last prescription.

## 2021-11-27 NOTE — Telephone Encounter (Signed)
Hello,  Can we please schedule this patient for a follow up appointment to discuss pain medicine management.   Thank you,  BC

## 2021-12-01 ENCOUNTER — Other Ambulatory Visit (HOSPITAL_COMMUNITY): Payer: Self-pay

## 2021-12-02 ENCOUNTER — Other Ambulatory Visit (HOSPITAL_COMMUNITY): Payer: Self-pay

## 2021-12-11 ENCOUNTER — Encounter: Payer: Self-pay | Admitting: Gynecologic Oncology

## 2021-12-15 ENCOUNTER — Other Ambulatory Visit: Payer: Self-pay

## 2021-12-15 ENCOUNTER — Encounter: Payer: Self-pay | Admitting: Gynecologic Oncology

## 2021-12-15 ENCOUNTER — Inpatient Hospital Stay: Payer: Medicare HMO | Attending: Gynecologic Oncology | Admitting: Gynecologic Oncology

## 2021-12-15 VITALS — BP 161/89 | HR 93 | Temp 98.1°F | Resp 16 | Ht 63.78 in | Wt 218.4 lb

## 2021-12-15 DIAGNOSIS — Z79899 Other long term (current) drug therapy: Secondary | ICD-10-CM | POA: Insufficient documentation

## 2021-12-15 DIAGNOSIS — Z96651 Presence of right artificial knee joint: Secondary | ICD-10-CM | POA: Insufficient documentation

## 2021-12-15 DIAGNOSIS — E785 Hyperlipidemia, unspecified: Secondary | ICD-10-CM | POA: Insufficient documentation

## 2021-12-15 DIAGNOSIS — Z7982 Long term (current) use of aspirin: Secondary | ICD-10-CM | POA: Insufficient documentation

## 2021-12-15 DIAGNOSIS — Z8616 Personal history of COVID-19: Secondary | ICD-10-CM | POA: Diagnosis not present

## 2021-12-15 DIAGNOSIS — R9389 Abnormal findings on diagnostic imaging of other specified body structures: Secondary | ICD-10-CM | POA: Diagnosis not present

## 2021-12-15 DIAGNOSIS — I10 Essential (primary) hypertension: Secondary | ICD-10-CM | POA: Diagnosis not present

## 2021-12-15 DIAGNOSIS — Z7985 Long-term (current) use of injectable non-insulin antidiabetic drugs: Secondary | ICD-10-CM | POA: Insufficient documentation

## 2021-12-15 DIAGNOSIS — E119 Type 2 diabetes mellitus without complications: Secondary | ICD-10-CM | POA: Diagnosis not present

## 2021-12-15 DIAGNOSIS — Z8719 Personal history of other diseases of the digestive system: Secondary | ICD-10-CM | POA: Insufficient documentation

## 2021-12-15 DIAGNOSIS — E669 Obesity, unspecified: Secondary | ICD-10-CM | POA: Diagnosis not present

## 2021-12-15 DIAGNOSIS — Z87410 Personal history of cervical dysplasia: Secondary | ICD-10-CM | POA: Diagnosis not present

## 2021-12-15 DIAGNOSIS — G4730 Sleep apnea, unspecified: Secondary | ICD-10-CM | POA: Insufficient documentation

## 2021-12-15 DIAGNOSIS — Z794 Long term (current) use of insulin: Secondary | ICD-10-CM | POA: Insufficient documentation

## 2021-12-15 DIAGNOSIS — Z6837 Body mass index (BMI) 37.0-37.9, adult: Secondary | ICD-10-CM | POA: Diagnosis not present

## 2021-12-15 DIAGNOSIS — Z7409 Other reduced mobility: Secondary | ICD-10-CM

## 2021-12-15 DIAGNOSIS — N8502 Endometrial intraepithelial neoplasia [EIN]: Secondary | ICD-10-CM | POA: Diagnosis not present

## 2021-12-15 NOTE — Progress Notes (Signed)
GYNECOLOGIC ONCOLOGY NEW PATIENT CONSULTATION  ° °Patient Name: Kristy [name]  °Patient Age: 66 y.o. °Date of Service: 12/15/21 °Referring Provider: Jertson, Jill Evelyn, MD °[address] °[address] °[place],  [place] [address]  ° °Primary Care Provider: Coe, Benjamin, MD °Consulting Provider: Zanaiya Calabria, MD  ° °Assessment/Plan:  °Postmenopausal patient with recent history of high-grade cervical dysplasia now presenting with a biopsy concerning for at least endometrial hyperplasia. ° °We discussed recent history of cervical dysplasia. She is at higher risk of recurrence of high-grade lesion given positive margins from her cervical excision. ° °Recent sampling (both endocervical and endometrial) favors that this is uterine in origin. 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 We discussed the importance of identifying whether this process is endocervical versus endometrial in origin as the treatment could potentially be treatment. ° °The patient is scheduled for knee replacement in mid-March. She is currently almost not ambulatory given her knee pain/dysfunction. She is at increased risk of VTE given her immobility. ° °We discussed that in the setting of precancer or even low grade uterine cancer, we could proceed with several months of medical management (treatment with progesterone) to allow her to undergo orthopedic surgery (which would hopefully make her recovery from definitive surgery with hysterectomy easier and lower risk). If she were found to have a high-grade uterine cancer or cervical cancer, then the patient is very amenable to postponing her knee surgery. ° °We discussed using an outpatient surgery to include hysteroscopy, hysteroscopic endometrial sampling, narrow cervical conization, and any other indicated procedure. Frozen section could be performed and if endometrial lesion confirmed (with no evidence of high grade lesion),  Mirena IUD could be placed at the time of her surgery to use as progesterone delivery until we can proceed with hysterectomy.  ° °The other option we discussed is that we could proceed with hysterectomy now - while there is a risk that this is in fact an endocervical lesion, her biopsies and imaging support endometrial origin.  ° °We reviewed the role of progesterone therapy and the effect on preneoplastic and neoplastic lesions of the uterus, believed to include induction of apoptosis in addition to tissue sloughing during withdrawal bleeding.  Activation of the progesterone receptors is believed to lead stromal decidualization and thinning of the lining.  We reviewed the 3 most studied options, to include levonorgesterol IUD (20mcg/d), oral medorxyprogesterone acetate 10mg daily or cyclically 12-14 days per month, or oral megesterol acetate 40-200mg per day.   ° °She understands that all options have few side effects, most common being infrequent edema, GI disturbances, and thromboembolic events), but that local progesterone through IUD may have a stronger effect on the endometrium with less systemic side effects.  Furthermore, studies demonstrate that 80-90% of women will have regression of their hyperplasia with progesterone use.  Ideally, we would avoid any increased risk of VTE, edema or weight gain.  ° °After discussion, the patient voiced her preference of proceeding with further diagnostic surgery including hysteroscopy, endometrial sampling, and endocervical sampling.  Also discussed placement of a Mirena IUD at the time of surgery.  We will do this based on frozen section review and if this looks like hyperplasia or low grade malignancy. °Our plan is for hysteroscopy, endometrial sampling under hysteroscopic guidance, possible D&C, possible cold knife cone, and any other indicated procedures.  The risks of surgery were discussed in detail and she understands these to include infection; injury to adjacent    organs such as bowel, bladder, blood vessels, ureters and nerves; bleeding which may require blood transfusion; anesthesia risk; thromboembolic events; possible death; unforeseen complications; possible need for re-exploration; medical complications such as heart attack, stroke, pleural effusion and pneumonia. The patient will receive DVT and antibiotic prophylaxis as indicated. She voiced a clear understanding. She had the opportunity to ask questions. Perioperative instructions were reviewed with her. Prescriptions for post-op medications were sent to her pharmacy of choice.  A copy of this note was sent to the patient's referring provider.   70 minutes of total time was spent for this patient encounter, including preparation, face-to-face counseling with the patient and coordination of care, and documentation of the encounter.   Jeral Pinch, MD  Division of Gynecologic Oncology  Department of Obstetrics and Gynecology  [facility]  ___________________________________________  Chief Complaint: Chief Complaint  Patient presents with   Thickened endometrium   Atypical endometrial hyperplasia    History of Present Illness:  Kristy [name] is a 66 y.o. y.o. female who is seen in consultation at the request of Talbert Nan Francesca Jewett, MD for an evaluation of atypical glandular cells.   She denies any history of abnormal Pap smears until recently.  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 The cervix was cauterized beyond the edge of the leep biopsy. When she was seen for her follow-up pap test on 1/18. Patient underwent colposcopy. Cervix was noted to be very stenotic.  No acetowhite changes were noted after application of acetic acid.  After application of Lugol's, as there was decreased uptake at 6:00, biopsy was performed. Because of her stenotic cervix, a scalpel was used to open the os with  drainage of a large amount of brown mucus. ECC was then performed.  Pelvic ultrasound exam was also performed given these findings and showed uterus measures 9 x 3.8 x 5.6 cm with an endometrial lining of 2.4 cm.  Masses seen within the endometrium as well as a small amount of fluid.  Cervix is dilated and filled with fluid.    Low-grade dysplasia was noted from the 6:00 cervical biopsy.  Endocervix biopsy showed minute detached fragments of atypical glandular epithelium, favored to be endometrial within a necroinflammatory diathesis.  Minute detached fragment of dysplastic metaplastic squamous epithelium, at least late grade also noted.  Endometrial biopsy showed scant detached atypical glandular epithelium, favored to be endometrial within a background of focal atypical hyperplasia.  Changes focally suggestive of an endometrial polyp.  Comment is that both the endocervical and endometrial sampling showed detached fragments of highly atypical glandular epithelium.  Additional sampling is recommended.  Patient denies any postmenopausal bleeding.  She denies any pelvic pain or cramping.  She endorses a good appetite without nausea or emesis.  She denies any bowel or bladder symptoms.  She has type 2 diabetes, treated with insulin.  Hgb A1c in 08/2021 - 7.1%.  When she remembers to check her glucose, she notes her fasting blood sugars are usually in the 120s.  Patient has a diagnosis of sleep apnea although does not use a CPAP, as her insurance would not cover this.  She denies any shortness of breath although is not very mobile secondary to her joint issues.  She has had a right knee replacement and is scheduled for a left knee replacement in mid March.  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 Albertine Patricia   Sleep apnea 2007    status post polysomnogram 2007 , suggested use of CPAP   Ventricular hypertrophy 2006    a 2-D echo in 2006, ejection fraction 55%, mild tricuspid regurgitation noted as well, 2-D echo November 14, 4707 showed diastolic dysfunction with LVEF normal of 65%     PAST SURGICAL HISTORY:  Past Surgical History:  Procedure Laterality Date   BREAST BIOPSY Right 2016   COLONOSCOPY  02/03/2010   RENAL ARTERY STENT  2007    patient is status post renal artery stent placement May 2007 and that was done secondary to renal artery stenosis, fibromuscular dysplasia   REPLACEMENT TOTAL KNEE  02/2011-Dr. Ninfa Linden   S/P Right Total Knee Replacement May/2012   TUBAL LIGATION      OB/GYN HISTORY:  OB History  Gravida Para Term Preterm AB Living  _0 SAB IAB Ectopic Multiple Live Births               # Outcome Date GA Lbr Len/2nd Weight Sex Delivery Anes PTL Lv  6 Para           5 Para           4 Para           3 Para           2 Para           1 Para             Patient's last menstrual period was 01/05/2009.  Age at menarche: 78  Age at menopause: 54 Hx of HRT: denies Hx of STDs: HPV Last pap: 11/12/21 -atypical endocervical cells, not otherwise specified.  HPV high risk positive  History of abnormal pap smears: yes 04/02/21 HSIL Hr HPV Positve   02/18/17 WNL  SCREENING STUDIES:  Last mammogram: 04/2021  Last colonoscopy: 2022  MEDICATIONS: Outpatient Encounter Medications as of 12/15/2021  Medication Sig   Ascorbic Acid (VITAMIN C) 1000 MG tablet Take 1,000 mg by mouth daily.   aspirin 81 MG EC tablet Take 81 mg by mouth daily.   atorvastatin (LIPITOR) 40 MG tablet TAKE 1 TABLET (40 MG TOTAL) BY MOUTH DAILY.   carvedilol (COREG) 12.5  MG tablet Take 1 tablet (12.5 mg total) by mouth 2 (two) times daily.   glucose 4 GM chewable tablet Chew 1 tablet by mouth as needed for low blood sugar.   Insulin Degludec-Liraglutide (XULTOPHY) 100-3.6 UNIT-MG/ML SOPN Inject 40 Units into the skin daily.   losartan-hydrochlorothiazide (HYZAAR) 100-25 MG tablet Take 1 tablet by mouth daily.   Multiple Vitamin (MULTIVITAMIN) tablet Take 1 tablet by mouth daily.   spironolactone (ALDACTONE) 50 MG tablet Take 1 tablet by mouth once daily   traMADol (ULTRAM) 50 MG tablet TAKE 2 TABLETS BY MOUTH EVERY 6 HOURS AS NEEDED FOR SEVERE PAIN   Turmeric 500 MG CAPS Take 500 mg by mouth daily.   [DISCONTINUED] Omega-3 Fatty Acids (FISH OIL) 1000 MG CAPS Take 1,000  mg by mouth daily.   Accu-Chek FastClix Lancets MISC Check blood sugar two times a day (Patient taking differently: 1 each by Other route in the morning and at bedtime.)   Blood Glucose Monitoring Suppl (ACCU-CHEK GUIDE) w/Device KIT 1 each by Does not apply route 2 (two) times daily.   glucose blood (ACCU-CHEK GUIDE) test strip Check blood sugar two times a day (Patient taking differently: 1 each by Other route in the morning and at bedtime.)   Insulin Pen Needle (B-D UF III MINI PEN NEEDLES) 31G X 5 MM MISC 1 Device by Does not apply route 3 (three) times daily before meals. Check daily 3X before meals   Insulin Pen Needle (UNIFINE PENTIPS) 31G X 6 MM MISC See admin instructions.   [DISCONTINUED] Na Sulfate-K Sulfate-Mg Sulf 17.5-3.13-1.6 GM/177ML SOLN TAKE AS DIRECTED.   No facility-administered encounter medications on file as of 12/15/2021.    ALLERGIES:  No Known Allergies   FAMILY HISTORY:  Family History  Problem Relation Age of Onset   Diabetes Mother    Cancer Mother    Ovarian cancer Mother    Diabetes Father    Colon polyps Sister    Hypertension Sister    Breast cancer Sister    Colon polyps Sister    Hypertension Sister    Hypertension Daughter    Stroke Daughter     Hypertension Daughter    Hypertension Daughter    Hypertension Daughter    Bipolar disorder Son    Alcohol abuse Son    Drug abuse Son    Esophageal cancer Maternal Aunt    Breast cancer Cousin    Cancer Other        breast cancer died in her 14s   Colon cancer Neg Hx    Stomach cancer Neg Hx    Rectal cancer Neg Hx      SOCIAL HISTORY:  Social Connections: Not on file    REVIEW OF SYSTEMS:  Denies appetite changes, fevers, chills, fatigue, unexplained weight changes. Denies hearing loss, neck lumps or masses, mouth sores, ringing in ears or voice changes. Denies cough or wheezing.  Denies shortness of breath. Denies chest pain or palpitations. Denies leg swelling. Denies abdominal distention, pain, blood in stools, constipation, diarrhea, nausea, vomiting, or early satiety. Denies pain with intercourse, dysuria, frequency, hematuria or incontinence. Denies hot flashes, pelvic pain, vaginal bleeding or vaginal discharge.   Denies back pain or muscle pain/cramps. Denies itching, rash, or wounds. Denies dizziness, headaches, numbness or seizures. Denies swollen lymph nodes or glands, denies easy bruising or bleeding. Denies anxiety, depression, confusion, or decreased concentration.  Physical Exam:  Vital Signs for this encounter:  Blood pressure (!) 161/89, pulse 93, temperature 98.1 F (36.7 C), temperature source Oral, resp. rate 16, height 5' 3.78" (1.62 m), weight 218 lb 6.4 oz (99.1 kg), last menstrual period 01/05/2009, SpO2 100 %. Body mass index is 37.75 kg/m. General: Alert, oriented, no acute distress.  HEENT: Normocephalic, atraumatic. Sclera anicteric.  Chest: Clear to auscultation bilaterally. No wheezes, rhonchi, or rales. Cardiovascular: Regular rate and rhythm, no murmurs, rubs, or gallops.  Abdomen: Obese. Normoactive bowel sounds. Soft, nondistended, nontender to palpation. No masses or hepatosplenomegaly appreciated. No palpable fluid wave.  Extremities:  Grossly normal range of motion. Warm, well perfused. No edema bilaterally.  Skin: No rashes or lesions.  Lymphatics: No cervical, supraclavicular, or inguinal adenopathy.  GU:  Normal external female genitalia. No lesions. No discharge or bleeding.  Bladder/urethra:  No lesions or masses, well supported bladder             Vagina: mildly atrophic, no lesions or masses.             Cervix: Normal appearing, no lesions. Somewhat flush with the vaginal mucosa, especially posteriorly.             Uterus: somewhat limited by body habitus, 8-10 cm, mobile, no parametrial involvement or nodularity.             Adnexa: No masses appreciated.  LABORATORY AND RADIOLOGIC DATA:  Outside medical records were reviewed to synthesize the above history, along with the history and physical obtained during the visit.   Lab Results  Component Value Date   WBC 7.0 11/26/2020   HGB 12.6 11/26/2020   HCT 38.4 11/26/2020   PLT 241 11/26/2020   GLUCOSE 95 01/06/2021   CHOL 130 06/08/2019   TRIG 183 (H) 11/23/2020   HDL 38 (L) 06/08/2019   LDLCALC 63 06/08/2019   ALT 33 11/26/2020   AST 40 11/26/2020   NA 141 01/06/2021   K 4.9 01/06/2021   CL 102 01/06/2021   CREATININE 1.76 (H) 01/06/2021   BUN 29 (H) 01/06/2021   CO2 23 01/06/2021   TSH 1.241 08/09/2014   INR 0.91 07/07/2011   HGBA1C 7.1 (H) 09/16/2021   MICROALBUR 27.38 (H) 05/10/2014

## 2021-12-15 NOTE — H&P (View-Only) (Signed)
GYNECOLOGIC ONCOLOGY NEW PATIENT CONSULTATION   Patient Name: Kristy [name]  Patient Age: 66 y.o. Date of Service: 12/15/21 Referring Provider: Salvadore Dom, MD [address] [place],  [place] [address]   Primary Care Provider: Marianna Payment, MD Consulting Provider: Jeral Pinch, MD   Assessment/Plan:  Postmenopausal patient with recent history of high-grade cervical dysplasia now presenting with a biopsy concerning for at least endometrial hyperplasia.  We discussed recent history of cervical dysplasia. She is at higher risk of recurrence of high-grade lesion given positive margins from her cervical excision.  Recent sampling (both endocervical and endometrial) favors that this is uterine in origin. 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 We discussed the importance of identifying whether this process is endocervical versus endometrial in origin as the treatment could potentially be treatment.  The patient is scheduled for knee replacement in mid-March. She is currently almost not ambulatory given her knee pain/dysfunction. She is at increased risk of VTE given her immobility.  We discussed that in the setting of precancer or even low grade uterine cancer, we could proceed with several months of medical management (treatment with progesterone) to allow her to undergo orthopedic surgery (which would hopefully make her recovery from definitive surgery with hysterectomy easier and lower risk). If she were found to have a high-grade uterine cancer or cervical cancer, then the patient is very amenable to postponing her knee surgery.  We discussed using an outpatient surgery to include hysteroscopy, hysteroscopic endometrial sampling, narrow cervical conization, and any other indicated procedure. Frozen section could be performed and if endometrial lesion confirmed (with no evidence of high grade lesion),  Mirena IUD could be placed at the time of her surgery to use as progesterone delivery until we can proceed with hysterectomy.   The other option we discussed is that we could proceed with hysterectomy now - while there is a risk that this is in fact an endocervical lesion, her biopsies and imaging support endometrial origin.   We reviewed the role of progesterone therapy and the effect on preneoplastic and neoplastic lesions of the uterus, believed to include induction of apoptosis in addition to tissue sloughing during withdrawal bleeding.  Activation of the progesterone receptors is believed to lead stromal decidualization and thinning of the lining.  We reviewed the 3 most studied options, to include levonorgesterol IUD (43mg/d), oral medorxyprogesterone acetate 123RAdaily or cyclically 107-62days per month, or oral megesterol acetate 40-2068mper day.    She understands that all options have few side effects, most common being infrequent edema, GI disturbances, and thromboembolic events), but that local progesterone through IUD may have a stronger effect on the endometrium with less systemic side effects.  Furthermore, studies demonstrate that 80-90% of women will have regression of their hyperplasia with progesterone use.  Ideally, we would avoid any increased risk of VTE, edema or weight gain.   After discussion, the patient voiced her preference of proceeding with further diagnostic surgery including hysteroscopy, endometrial sampling, and endocervical sampling.  Also discussed placement of a Mirena IUD at the time of surgery.  We will do this based on frozen section review and if this looks like hyperplasia or low grade malignancy. Our plan is for hysteroscopy, endometrial sampling under hysteroscopic guidance, possible D&C, possible cold knife cone, and any other indicated procedures.  The risks of surgery were discussed in detail and she understands these to include infection; injury to adjacent  organs such as bowel, bladder, blood vessels, ureters and nerves; bleeding which may require blood transfusion; anesthesia risk; thromboembolic events; possible death; unforeseen complications; possible need for re-exploration; medical complications such as heart attack, stroke, pleural effusion and pneumonia. The patient will receive DVT and antibiotic prophylaxis as indicated. She voiced a clear understanding. She had the opportunity to ask questions. Perioperative instructions were reviewed with her. Prescriptions for post-op medications were sent to her pharmacy of choice.  A copy of this note was sent to the patient's referring provider.   70 minutes of total time was spent for this patient encounter, including preparation, face-to-face counseling with the patient and coordination of care, and documentation of the encounter.   Jeral Pinch, MD  Division of Gynecologic Oncology  Department of Obstetrics and Gynecology  [facility]  ___________________________________________  Chief Complaint: Chief Complaint  Patient presents with   Thickened endometrium   Atypical endometrial hyperplasia    History of Present Illness:  Kristy [name] is a 66 y.o. y.o. female who is seen in consultation at the request of Talbert Nan Francesca Jewett, MD for an evaluation of atypical glandular cells.   She denies any history of abnormal Pap smears until recently.  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 The cervix was cauterized beyond the edge of the leep biopsy. When she was seen for her follow-up pap test on 1/18. Patient underwent colposcopy. Cervix was noted to be very stenotic.  No acetowhite changes were noted after application of acetic acid.  After application of Lugol's, as there was decreased uptake at 6:00, biopsy was performed. Because of her stenotic cervix, a scalpel was used to open the os with  drainage of a large amount of brown mucus. ECC was then performed.  Pelvic ultrasound exam was also performed given these findings and showed uterus measures 9 x 3.8 x 5.6 cm with an endometrial lining of 2.4 cm.  Masses seen within the endometrium as well as a small amount of fluid.  Cervix is dilated and filled with fluid.    Low-grade dysplasia was noted from the 6:00 cervical biopsy.  Endocervix biopsy showed minute detached fragments of atypical glandular epithelium, favored to be endometrial within a necroinflammatory diathesis.  Minute detached fragment of dysplastic metaplastic squamous epithelium, at least late grade also noted.  Endometrial biopsy showed scant detached atypical glandular epithelium, favored to be endometrial within a background of focal atypical hyperplasia.  Changes focally suggestive of an endometrial polyp.  Comment is that both the endocervical and endometrial sampling showed detached fragments of highly atypical glandular epithelium.  Additional sampling is recommended.  Patient denies any postmenopausal bleeding.  She denies any pelvic pain or cramping.  She endorses a good appetite without nausea or emesis.  She denies any bowel or bladder symptoms.  She has type 2 diabetes, treated with insulin.  Hgb A1c in 08/2021 - 7.1%.  When she remembers to check her glucose, she notes her fasting blood sugars are usually in the 120s.  Patient has a diagnosis of sleep apnea although does not use a CPAP, as her insurance would not cover this.  She denies any shortness of breath although is not very mobile secondary to her joint issues.  She has had a right knee replacement and is scheduled for a left knee replacement in mid March.  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 Albertine Patricia   Sleep apnea 2007    status post polysomnogram 2007 , suggested use of CPAP   Ventricular hypertrophy 2006    a 2-D echo in 2006, ejection fraction 55%, mild tricuspid regurgitation noted as well, 2-D echo November 13, 4852 showed diastolic dysfunction with LVEF normal of 65%     PAST SURGICAL HISTORY:  Past Surgical History:  Procedure Laterality Date   BREAST BIOPSY Right 2016   COLONOSCOPY  02/03/2010   RENAL ARTERY STENT  2007    patient is status post renal artery stent placement May 2007 and that was done secondary to renal artery stenosis, fibromuscular dysplasia   REPLACEMENT TOTAL KNEE  02/2011-Dr. Ninfa Linden   S/P Right Total Knee Replacement May/2012   TUBAL LIGATION      OB/GYN HISTORY:  OB History  Gravida Para Term Preterm AB Living  _0 SAB IAB Ectopic Multiple Live Births               # Outcome Date GA Lbr Len/2nd Weight Sex Delivery Anes PTL Lv  6 Para           5 Para           4 Para           3 Para           2 Para           1 Para             Patient's last menstrual period was 01/05/2009.  Age at menarche: 63  Age at menopause: 58 Hx of HRT: denies Hx of STDs: HPV Last pap: 11/12/21 -atypical endocervical cells, not otherwise specified.  HPV high risk positive  History of abnormal pap smears: yes 04/02/21 HSIL Hr HPV Positve   02/18/17 WNL  SCREENING STUDIES:  Last mammogram: 04/2021  Last colonoscopy: 2022  MEDICATIONS: Outpatient Encounter Medications as of 12/15/2021  Medication Sig   Ascorbic Acid (VITAMIN C) 1000 MG tablet Take 1,000 mg by mouth daily.   aspirin 81 MG EC tablet Take 81 mg by mouth daily.   atorvastatin (LIPITOR) 40 MG tablet TAKE 1 TABLET (40 MG TOTAL) BY MOUTH DAILY.   carvedilol (COREG) 12.5  MG tablet Take 1 tablet (12.5 mg total) by mouth 2 (two) times daily.   glucose 4 GM chewable tablet Chew 1 tablet by mouth as needed for low blood sugar.   Insulin Degludec-Liraglutide (XULTOPHY) 100-3.6 UNIT-MG/ML SOPN Inject 40 Units into the skin daily.   losartan-hydrochlorothiazide (HYZAAR) 100-25 MG tablet Take 1 tablet by mouth daily.   Multiple Vitamin (MULTIVITAMIN) tablet Take 1 tablet by mouth daily.   spironolactone (ALDACTONE) 50 MG tablet Take 1 tablet by mouth once daily   traMADol (ULTRAM) 50 MG tablet TAKE 2 TABLETS BY MOUTH EVERY 6 HOURS AS NEEDED FOR SEVERE PAIN   Turmeric 500 MG CAPS Take 500 mg by mouth daily.   [DISCONTINUED] Omega-3 Fatty Acids (FISH OIL) 1000 MG CAPS Take 1,000  mg by mouth daily.   Accu-Chek FastClix Lancets MISC Check blood sugar two times a day (Patient taking differently: 1 each by Other route in the morning and at bedtime.)   Blood Glucose Monitoring Suppl (ACCU-CHEK GUIDE) w/Device KIT 1 each by Does not apply route 2 (two) times daily.   glucose blood (ACCU-CHEK GUIDE) test strip Check blood sugar two times a day (Patient taking differently: 1 each by Other route in the morning and at bedtime.)   Insulin Pen Needle (B-D UF III MINI PEN NEEDLES) 31G X 5 MM MISC 1 Device by Does not apply route 3 (three) times daily before meals. Check daily 3X before meals   Insulin Pen Needle (UNIFINE PENTIPS) 31G X 6 MM MISC See admin instructions.   [DISCONTINUED] Na Sulfate-K Sulfate-Mg Sulf 17.5-3.13-1.6 GM/177ML SOLN TAKE AS DIRECTED.   No facility-administered encounter medications on file as of 12/15/2021.    ALLERGIES:  No Known Allergies   FAMILY HISTORY:  Family History  Problem Relation Age of Onset   Diabetes Mother    Cancer Mother    Ovarian cancer Mother    Diabetes Father    Colon polyps Sister    Hypertension Sister    Breast cancer Sister    Colon polyps Sister    Hypertension Sister    Hypertension Daughter    Stroke Daughter     Hypertension Daughter    Hypertension Daughter    Hypertension Daughter    Bipolar disorder Son    Alcohol abuse Son    Drug abuse Son    Esophageal cancer Maternal Aunt    Breast cancer Cousin    Cancer Other        breast cancer died in her 35s   Colon cancer Neg Hx    Stomach cancer Neg Hx    Rectal cancer Neg Hx      SOCIAL HISTORY:  Social Connections: Not on file    REVIEW OF SYSTEMS:  Denies appetite changes, fevers, chills, fatigue, unexplained weight changes. Denies hearing loss, neck lumps or masses, mouth sores, ringing in ears or voice changes. Denies cough or wheezing.  Denies shortness of breath. Denies chest pain or palpitations. Denies leg swelling. Denies abdominal distention, pain, blood in stools, constipation, diarrhea, nausea, vomiting, or early satiety. Denies pain with intercourse, dysuria, frequency, hematuria or incontinence. Denies hot flashes, pelvic pain, vaginal bleeding or vaginal discharge.   Denies back pain or muscle pain/cramps. Denies itching, rash, or wounds. Denies dizziness, headaches, numbness or seizures. Denies swollen lymph nodes or glands, denies easy bruising or bleeding. Denies anxiety, depression, confusion, or decreased concentration.  Physical Exam:  Vital Signs for this encounter:  Blood pressure (!) 161/89, pulse 93, temperature 98.1 F (36.7 C), temperature source Oral, resp. rate 16, height 5' 3.78" (1.62 m), weight 218 lb 6.4 oz (99.1 kg), last menstrual period 01/05/2009, SpO2 100 %. Body mass index is 37.75 kg/m. General: Alert, oriented, no acute distress.  HEENT: Normocephalic, atraumatic. Sclera anicteric.  Chest: Clear to auscultation bilaterally. No wheezes, rhonchi, or rales. Cardiovascular: Regular rate and rhythm, no murmurs, rubs, or gallops.  Abdomen: Obese. Normoactive bowel sounds. Soft, nondistended, nontender to palpation. No masses or hepatosplenomegaly appreciated. No palpable fluid wave.  Extremities:  Grossly normal range of motion. Warm, well perfused. No edema bilaterally.  Skin: No rashes or lesions.  Lymphatics: No cervical, supraclavicular, or inguinal adenopathy.  GU:  Normal external female genitalia. No lesions. No discharge or bleeding.  Bladder/urethra:  No lesions or masses, well supported bladder             Vagina: mildly atrophic, no lesions or masses.             Cervix: Normal appearing, no lesions. Somewhat flush with the vaginal mucosa, especially posteriorly.             Uterus: somewhat limited by body habitus, 8-10 cm, mobile, no parametrial involvement or nodularity.             Adnexa: No masses appreciated.  LABORATORY AND RADIOLOGIC DATA:  Outside medical records were reviewed to synthesize the above history, along with the history and physical obtained during the visit.   Lab Results  Component Value Date   WBC 7.0 11/26/2020   HGB 12.6 11/26/2020   HCT 38.4 11/26/2020   PLT 241 11/26/2020   GLUCOSE 95 01/06/2021   CHOL 130 06/08/2019   TRIG 183 (H) 11/23/2020   HDL 38 (L) 06/08/2019   LDLCALC 63 06/08/2019   ALT 33 11/26/2020   AST 40 11/26/2020   NA 141 01/06/2021   K 4.9 01/06/2021   CL 102 01/06/2021   CREATININE 1.76 (H) 01/06/2021   BUN 29 (H) 01/06/2021   CO2 23 01/06/2021   TSH 1.241 08/09/2014   INR 0.91 07/07/2011   HGBA1C 7.1 (H) 09/16/2021   MICROALBUR 27.38 (H) 05/10/2014

## 2021-12-15 NOTE — Patient Instructions (Addendum)
Preparing for your Surgery  Plan for surgery on (2/28) with Dr. Berline Lopes at [facility] will be scheduled for a hysteroscopy, D&C, cold knife cone of your cervix, and any other indicated procedures.   Pre-operative Testing -You will receive a phone call from presurgical testing at [facility] to arrange for a pre-operative testing appointment before your surgery.  This appointment normally occurs one to two weeks before your scheduled surgery.   -Bring your insurance card, copy of an advanced directive if applicable, medication list  -At that visit, you will be asked to sign a consent for a possible blood transfusion in case a transfusion becomes necessary during surgery.  The need for a blood transfusion is rare but having consent is a necessary part of your care.     -You should not be taking blood thinners or aspirin at least ten days prior to surgery unless instructed by your surgeon.  -As part of our enhanced surgical recovery pathway, you may be advised to drink a carbohydrate drink the morning of surgery (at least 3 hours before). If you are diabetic, this will be substituted with G2 gatorade in order to prevent elevated glucose levels prior to surgery.  -Do not take supplements such as fish oil (omega 3), red yeast rice, tumeric before your surgery.  Day Before Surgery at [place] will be asked to take in a light diet the day before surgery.  Avoid carbonated beverages.  You will be advised to have nothing to eat or drink after midnight the evening before.    Eat a light diet the day before surgery.    Your role in recovery Your role is to become active as soon as directed by your doctor, while still giving yourself time to heal.  Rest when you feel tired. You will be asked to do the following in order to speed your recovery:  - Cough and breathe deeply. This helps toclear and expand your lungs and can prevent pneumonia.  - Do mild physical activity. Walking or  moving your legs help your circulation and body functions return to normal. A staff member will help you when you try to walk and will provide you with simple exercises. Do not try to get up or walk alone the first time. - Actively manage your pain. Managing your pain lets you move in comfort. We will ask you to rate your pain on a scale of zero to 10. It is your responsibility to tell your doctor or nurse where and how much you hurt so your pain can be treated.  Special Considerations -If you are diabetic, you may be placed on insulin after surgery to have closer control over your blood sugars to promote healing and recovery.  This does not mean that you will be discharged on insulin.  If applicable, your oral antidiabetics will be resumed when you are tolerating a solid diet.  -Your final pathology results from surgery should be available around one week after surgery and the results will be relayed to you when available.  -FMLA forms can be faxed to [phone] and please allow 5-7 business days for completion.  Pain Management After Surgery -You may be prescribed pain medication and bowel regimen medications before surgery so that you can have these available when you are discharged from the hospital. The pain medication is for use ONLY AFTER surgery and a new prescription will not be given.   -Make sure that you have Tylenol and Ibuprofen at home  to use on a regular basis after surgery for pain control. We recommend alternating the medications every hour to six hours since they work differently and are processed in the body differently for pain relief.  -Review the attached handout on narcotic use and their risks and side effects.   Bowel Regimen -You have been prescribed Sennakot-S to take nightly to prevent constipation especially if you are taking the narcotic pain medication intermittently.  It is important to prevent constipation and drink adequate amounts of liquids.

## 2021-12-16 ENCOUNTER — Telehealth: Payer: Self-pay | Admitting: *Deleted

## 2021-12-16 DIAGNOSIS — Z87410 Personal history of cervical dysplasia: Secondary | ICD-10-CM | POA: Insufficient documentation

## 2021-12-16 DIAGNOSIS — R9389 Abnormal findings on diagnostic imaging of other specified body structures: Secondary | ICD-10-CM | POA: Insufficient documentation

## 2021-12-16 NOTE — Patient Instructions (Addendum)
DUE TO COVID-19 ONLY ONE VISITOR IS ALLOWED TO COME WITH YOU AND STAY IN THE WAITING ROOM ONLY DURING PRE OP AND PROCEDURE.   **NO VISITORS ARE ALLOWED IN THE SHORT STAY AREA OR RECOVERY ROOM!!**  IF YOU WILL BE ADMITTED INTO THE HOSPITAL YOU ARE ALLOWED ONLY TWO SUPPORT PEOPLE DURING VISITATION HOURS ONLY (7 AM -8PM)   The support person(s) must pass our screening, gel in and out, and wear a mask at all times, including in the patients room. Patients must also wear a mask when staff or their support person are in the room. Visitors GUEST BADGE MUST BE WORN VISIBLY  One adult visitor may remain with you overnight and MUST be in the room by 8 P.M.  No visitors under the age of 24. Any visitor under the age of 72 must be accompanied by an adult.    Your procedure is scheduled on: 12/23/21   Report to [facility] Main Entrance    Report to admitting at : 11:45 AM   Call this number if you have problems the morning of surgery [phone]   Do not eat food :After Midnight.   May have liquids until : 11:00 AM   day of surgery  CLEAR LIQUID DIET  Foods Allowed                                                                     Foods Excluded  Water, Black Coffee and tea, regular and decaf                             liquids that you cannot  Plain Jell-O in any flavor  (No red)                                           see through such as: Fruit ices (not with fruit pulp)                                     milk, soups, orange juice              Iced Popsicles (No red)                                    All solid food, NO COFFEE CREAMERS                                 Apple juices Sports drinks like Gatorade (No red) Lightly seasoned clear broth or consume(fat free) Sugar Sample Menu Breakfast                                Lunch  Supper Apple juice                    Beef broth                            Chicken broth Jell-O                                      Apple juice                           Apple juice Coffee or tea                        Jell-O                                      Popsicle                                                Coffee or tea                        Coffee or tea   FOLLOW BOWEL PREP AND ANY ADDITIONAL PRE OP INSTRUCTIONS YOU RECEIVED FROM YOUR SURGEON'S OFFICE!!!    Oral Hygiene is also important to reduce your risk of infection.                                    Remember - BRUSH YOUR TEETH THE MORNING OF SURGERY WITH YOUR REGULAR TOOTHPASTE   Do NOT smoke after Midnight   Take these medicines the morning of surgery with A SIP OF WATER: carvedilol.  How to Manage Your Diabetes Before and After Surgery  Why is it important to control my blood sugar before and after surgery? Improving blood sugar levels before and after surgery helps healing and can limit problems. A way of improving blood sugar control is eating a healthy diet by:  Eating less sugar and carbohydrates  Increasing activity/exercise  Talking with your doctor about reaching your blood sugar goals High blood sugars (greater than 180 mg/dL) can raise your risk of infections and slow your recovery, so you will need to focus on controlling your diabetes during the weeks before surgery. Make sure that the doctor who takes care of your diabetes knows about your planned surgery including the date and location.  How do I manage my blood sugar before surgery? Check your blood sugar at least 4 times a day, starting 2 days before surgery, to make sure that the level is not too high or low. Check your blood sugar the morning of your surgery when you wake up and every 2 hours until you get to the Short Stay unit. If your blood sugar is less than 70 mg/dL, you will need to treat for low blood sugar: Do not take insulin. Treat a low blood sugar (less than 70 mg/dL) with  cup of clear juice (cranberry or apple), 4 glucose tablets, OR glucose  gel. Recheck blood sugar in  15 minutes after treatment (to make sure it is greater than 70 mg/dL). If your blood sugar is not greater than 70 mg/dL on recheck, call [phone] for further instructions. Report your blood sugar to the short stay nurse when you get to Short Stay.  If you are admitted to the hospital after surgery: Your blood sugar will be checked by the staff and you will probably be given insulin after surgery (instead of oral diabetes medicines) to make sure you have good blood sugar levels. The goal for blood sugar control after surgery is 80-180 mg/dL.   WHAT DO I DO ABOUT MY DIABETES MEDICATION?  Do not take oral diabetes medicines (pills) the morning of surgery.  THE DAY BEFORE SURGERY, take degludec insulin as usual (AM)     THE MORNING OF SURGERY, take half of the dose of degludec insulin.  DO NOT TAKE ANY ORAL DIABETIC MEDICATIONS DAY OF YOUR SURGERY                              You may not have any metal on your body including hair pins, jewelry, and body piercing             Do not wear make-up, lotions, powders, perfumes/cologne, or deodorant  Do not wear nail polish including gel and S&S, artificial/acrylic nails, or any other type of covering on natural nails including finger and toenails. If you have artificial nails, gel coating, etc. that needs to be removed by a nail salon please have this removed prior to surgery or surgery may need to be canceled/ delayed if the surgeon/ anesthesia feels like they are unable to be safely monitored.   Do not shave  48 hours prior to surgery.               Men may shave face and neck.   Do not bring valuables to the hospital. [place].   Contacts, dentures or bridgework may not be worn into surgery.   Bring small overnight bag day of surgery.    Patients discharged on the day of surgery will not be allowed to drive home.  Someone needs to stay with you for the  first 24 hours after anesthesia.   Special Instructions: Bring a copy of your healthcare power of attorney and living will documents         the day of surgery if you haven't scanned them before.              Please read over the following fact sheets you were given: IF YOU HAVE QUESTIONS ABOUT YOUR PRE-OP INSTRUCTIONS PLEASE CALL [phone]     [facility] - Preparing for Surgery Before surgery, you can play an important role.  Because skin is not sterile, your skin needs to be as free of germs as possible.  You can reduce the number of germs on your skin by washing with CHG (chlorahexidine gluconate) soap before surgery.  CHG is an antiseptic cleaner which kills germs and bonds with the skin to continue killing germs even after washing. Please DO NOT use if you have an allergy to CHG or antibacterial soaps.  If your skin becomes reddened/irritated stop using the CHG and inform your nurse when you arrive at Short Stay. Do not shave (including legs and underarms) for at least 48 hours  prior to the first CHG shower.  You may shave your face/neck. Please follow these instructions carefully:  1.  Shower with CHG Soap the night before surgery and the  morning of Surgery.  2.  If you choose to wash your hair, wash your hair first as usual with your  normal  shampoo.  3.  After you shampoo, rinse your hair and body thoroughly to remove the  shampoo.                           4.  Use CHG as you would any other liquid soap.  You can apply chg directly  to the skin and wash                       Gently with a scrungie or clean washcloth.  5.  Apply the CHG Soap to your body ONLY FROM THE NECK DOWN.   Do not use on face/ open                           Wound or open sores. Avoid contact with eyes, ears mouth and genitals (private parts).                       Wash face,  Genitals (private parts) with your normal soap.             6.  Wash thoroughly, paying special attention to the area where your surgery   will be performed.  7.  Thoroughly rinse your body with warm water from the neck down.  8.  DO NOT shower/wash with your normal soap after using and rinsing off  the CHG Soap.                9.  Pat yourself dry with a clean towel.            10.  Wear clean pajamas.            11.  Place clean sheets on your bed the night of your first shower and do not  sleep with pets. Day of Surgery : Do not apply any lotions/deodorants the morning of surgery.  Please wear clean clothes to the hospital/surgery center.  FAILURE TO FOLLOW THESE INSTRUCTIONS MAY RESULT IN THE CANCELLATION OF YOUR SURGERY PATIENT SIGNATURE_________________________________  NURSE SIGNATURE__________________________________  ________________________________________________________________________

## 2021-12-16 NOTE — Progress Notes (Signed)
Pt. Needs orders for upcoming surgery. ?

## 2021-12-16 NOTE — Telephone Encounter (Signed)
Surgical optimization form successfully faxed to Dr. Sammie Bench office.

## 2021-12-17 ENCOUNTER — Other Ambulatory Visit (HOSPITAL_COMMUNITY): Payer: Self-pay

## 2021-12-17 ENCOUNTER — Other Ambulatory Visit: Payer: Self-pay | Admitting: Internal Medicine

## 2021-12-18 ENCOUNTER — Encounter (HOSPITAL_COMMUNITY): Payer: Self-pay

## 2021-12-18 ENCOUNTER — Other Ambulatory Visit: Payer: Self-pay

## 2021-12-18 ENCOUNTER — Encounter (HOSPITAL_COMMUNITY)
Admission: RE | Admit: 2021-12-18 | Discharge: 2021-12-18 | Disposition: A | Discharge status: Home / Self Care | Payer: Medicare HMO | Source: Ambulatory Visit | Attending: Gynecologic Oncology | Admitting: Gynecologic Oncology

## 2021-12-18 ENCOUNTER — Telehealth: Payer: Self-pay | Admitting: Gynecologic Oncology

## 2021-12-18 VITALS — BP 164/112 | HR 91 | Temp 97.9°F | Resp 16 | Ht 62.0 in | Wt 197.3 lb

## 2021-12-18 DIAGNOSIS — M1711 Unilateral primary osteoarthritis, right knee: Secondary | ICD-10-CM | POA: Diagnosis not present

## 2021-12-18 DIAGNOSIS — E1142 Type 2 diabetes mellitus with diabetic polyneuropathy: Secondary | ICD-10-CM

## 2021-12-18 DIAGNOSIS — Z01818 Encounter for other preprocedural examination: Secondary | ICD-10-CM | POA: Insufficient documentation

## 2021-12-18 HISTORY — DX: Chronic kidney disease, unspecified: N18.9

## 2021-12-18 HISTORY — DX: Unspecified osteoarthritis, unspecified site: M19.90

## 2021-12-18 LAB — CBC
HCT: 40.6 % (ref 36.0–46.0)
Hemoglobin: 13.2 g/dL (ref 12.0–15.0)
MCH: 28.6 pg (ref 26.0–34.0)
MCHC: 32.5 g/dL (ref 30.0–36.0)
MCV: 88.1 fL (ref 80.0–100.0)
Platelets: 197 10*3/uL (ref 150–400)
RBC: 4.61 MIL/uL (ref 3.87–5.11)
RDW: 14.6 % (ref 11.5–15.5)
WBC: 10.6 10*3/uL — ABNORMAL HIGH (ref 4.0–10.5)
nRBC: 0 % (ref 0.0–0.2)

## 2021-12-18 LAB — BASIC METABOLIC PANEL
Anion gap: 6 (ref 5–15)
BUN: 40 mg/dL — ABNORMAL HIGH (ref 8–23)
CO2: 24 mmol/L (ref 22–32)
Calcium: 9.8 mg/dL (ref 8.9–10.3)
Chloride: 108 mmol/L (ref 98–111)
Creatinine, Ser: 2.16 mg/dL — ABNORMAL HIGH (ref 0.44–1.00)
GFR, Estimated: 25 mL/min — ABNORMAL LOW (ref >60)
Glucose, Bld: 142 mg/dL — ABNORMAL HIGH (ref 70–99)
Potassium: 4.5 mmol/L (ref 3.5–5.1)
Sodium: 138 mmol/L (ref 135–145)

## 2021-12-18 LAB — GLUCOSE, CAPILLARY: Glucose-Capillary: 149 mg/dL — ABNORMAL HIGH (ref 70–99)

## 2021-12-18 MED ORDER — LOSARTAN POTASSIUM-HCTZ 100-25 MG PO TABS
1.0000 | ORAL_TABLET | Freq: Every day | ORAL | 0 refills | Status: DC
  Filled 2021-12-18: qty 30, 30d supply, fill #0

## 2021-12-18 MED ORDER — CARVEDILOL 12.5 MG PO TABS
12.5000 mg | ORAL_TABLET | Freq: Two times a day (BID) | ORAL | 0 refills | Status: DC
  Filled 2021-12-18: qty 60, 30d supply, fill #0

## 2021-12-18 NOTE — Telephone Encounter (Signed)
Called patient to follow up from recent visit with Dr. Berline Lopes to see if she had any questions etc. No answer and unable to leave a voicemail.

## 2021-12-18 NOTE — Progress Notes (Addendum)
COVID Vaccine: NO COVID SWAB appointment date: N/A Date of COVID positive in last 90 days: N/A  Bowel Prep reminder: N/A   For Anesthesia: PCP - Dr. Marianna Payment Cardiologist - N/A  Chest x-ray -  EKG -  Stress Test -  ECHO -  Cardiac Cath -  Pacemaker/ICD device last checked: Pacemaker orders received: Device Rep notified:  Spinal Cord Stimulator:  Sleep Study -  CPAP -   Fasting Blood Sugar - 100's Checks Blood Sugar ___2__ times a week. Date and result of last Hgb A1c-  Blood Thinner Instructions: Aspirin Instructions: To hold. Last Dose:  Activity level: Can go up a flight of stairs and activities of daily living without stopping and without chest pain and/or shortness of breath   Able to exercise without chest pain and/or shortness of breath   Unable to go up a flight of stairs without chest pain and/or shortness of breath     Anesthesia review: Hx: HTN,DIA,OSA(NO CPAP),CKD III  Patient denies shortness of breath, fever, cough and chest pain at PAT appointment   Patient verbalized understanding of instructions that were given to them at the PAT appointment. Patient was also instructed that they will need to review over the PAT instructions again at home before surgery.

## 2021-12-18 NOTE — Progress Notes (Signed)
Lab. Results: Creatinine: 2.16 WBC: 10.6

## 2021-12-19 ENCOUNTER — Other Ambulatory Visit (HOSPITAL_COMMUNITY): Payer: Self-pay

## 2021-12-19 LAB — HEMOGLOBIN A1C
Hgb A1c MFr Bld: 7.5 % — ABNORMAL HIGH (ref 4.8–5.6)
Mean Plasma Glucose: 169 mg/dL

## 2021-12-22 ENCOUNTER — Telehealth: Payer: Self-pay

## 2021-12-22 ENCOUNTER — Encounter (HOSPITAL_COMMUNITY): Payer: Self-pay | Admitting: Gynecologic Oncology

## 2021-12-22 NOTE — Telephone Encounter (Signed)
Telephone call to check on pre-operative status.  Patient compliant with pre-operative instructions.  Reinforced nothing to eat after midnight. Clear liquids until 11:00 am. Patient to arrive at 11:45 am.  No questions or concerns voiced.  Instructed to call for any needs.

## 2021-12-23 ENCOUNTER — Encounter (HOSPITAL_COMMUNITY)
Admission: RE | Disposition: A | Discharge status: Home / Self Care | Payer: Self-pay | Source: Home / Self Care | Attending: Gynecologic Oncology

## 2021-12-23 ENCOUNTER — Other Ambulatory Visit: Payer: Self-pay

## 2021-12-23 ENCOUNTER — Encounter (HOSPITAL_COMMUNITY): Payer: Self-pay | Admitting: Gynecologic Oncology

## 2021-12-23 ENCOUNTER — Ambulatory Visit (HOSPITAL_COMMUNITY)
Admission: RE | Admit: 2021-12-23 | Discharge: 2021-12-23 | Disposition: A | Discharge status: Home / Self Care | Payer: Medicare HMO | Attending: Gynecologic Oncology | Admitting: Gynecologic Oncology

## 2021-12-23 ENCOUNTER — Ambulatory Visit (HOSPITAL_COMMUNITY): Payer: Medicare HMO | Admitting: Anesthesiology

## 2021-12-23 ENCOUNTER — Ambulatory Visit (HOSPITAL_BASED_OUTPATIENT_CLINIC_OR_DEPARTMENT_OTHER): Payer: Medicare HMO | Admitting: Anesthesiology

## 2021-12-23 DIAGNOSIS — Z794 Long term (current) use of insulin: Secondary | ICD-10-CM | POA: Diagnosis not present

## 2021-12-23 DIAGNOSIS — Z96651 Presence of right artificial knee joint: Secondary | ICD-10-CM | POA: Diagnosis not present

## 2021-12-23 DIAGNOSIS — E119 Type 2 diabetes mellitus without complications: Secondary | ICD-10-CM | POA: Diagnosis not present

## 2021-12-23 DIAGNOSIS — Z30430 Encounter for insertion of intrauterine contraceptive device: Secondary | ICD-10-CM | POA: Diagnosis not present

## 2021-12-23 DIAGNOSIS — E1151 Type 2 diabetes mellitus with diabetic peripheral angiopathy without gangrene: Secondary | ICD-10-CM | POA: Diagnosis not present

## 2021-12-23 DIAGNOSIS — G4730 Sleep apnea, unspecified: Secondary | ICD-10-CM | POA: Diagnosis not present

## 2021-12-23 DIAGNOSIS — R262 Difficulty in walking, not elsewhere classified: Secondary | ICD-10-CM | POA: Diagnosis not present

## 2021-12-23 DIAGNOSIS — N8502 Endometrial intraepithelial neoplasia [EIN]: Secondary | ICD-10-CM | POA: Diagnosis not present

## 2021-12-23 DIAGNOSIS — D259 Leiomyoma of uterus, unspecified: Secondary | ICD-10-CM | POA: Diagnosis not present

## 2021-12-23 DIAGNOSIS — Z87891 Personal history of nicotine dependence: Secondary | ICD-10-CM | POA: Diagnosis not present

## 2021-12-23 DIAGNOSIS — I10 Essential (primary) hypertension: Secondary | ICD-10-CM

## 2021-12-23 DIAGNOSIS — N879 Dysplasia of cervix uteri, unspecified: Secondary | ICD-10-CM

## 2021-12-23 DIAGNOSIS — R87612 Low grade squamous intraepithelial lesion on cytologic smear of cervix (LGSIL): Secondary | ICD-10-CM | POA: Diagnosis not present

## 2021-12-23 DIAGNOSIS — C541 Malignant neoplasm of endometrium: Secondary | ICD-10-CM | POA: Insufficient documentation

## 2021-12-23 DIAGNOSIS — M25569 Pain in unspecified knee: Secondary | ICD-10-CM | POA: Diagnosis not present

## 2021-12-23 DIAGNOSIS — N840 Polyp of corpus uteri: Secondary | ICD-10-CM | POA: Insufficient documentation

## 2021-12-23 DIAGNOSIS — N870 Mild cervical dysplasia: Secondary | ICD-10-CM | POA: Diagnosis not present

## 2021-12-23 HISTORY — PX: CERVICAL CONIZATION W/BX: SHX1330

## 2021-12-23 HISTORY — PX: DILATATION & CURETTAGE/HYSTEROSCOPY WITH MYOSURE: SHX6511

## 2021-12-23 HISTORY — PX: INTRAUTERINE DEVICE (IUD) INSERTION: SHX5877

## 2021-12-23 HISTORY — PX: DILATION AND CURETTAGE OF UTERUS: SHX78

## 2021-12-23 LAB — GLUCOSE, CAPILLARY
Glucose-Capillary: 112 mg/dL — ABNORMAL HIGH (ref 70–99)
Glucose-Capillary: 105 mg/dL — ABNORMAL HIGH (ref 70–99)
Glucose-Capillary: 74 mg/dL (ref 70–99)

## 2021-12-23 SURGERY — DILATATION & CURETTAGE/HYSTEROSCOPY WITH MYOSURE
Anesthesia: General

## 2021-12-23 MED ORDER — ONDANSETRON HCL 4 MG/2ML IJ SOLN
INTRAMUSCULAR | Status: DC | PRN
Start: 2021-12-23 — End: 2021-12-23
  Administered 2021-12-23: 4 mg via INTRAVENOUS

## 2021-12-23 MED ORDER — FENTANYL CITRATE (PF) 100 MCG/2ML IJ SOLN
INTRAMUSCULAR | Status: AC
  Filled 2021-12-23: qty 2

## 2021-12-23 MED ORDER — PROPOFOL 10 MG/ML IV BOLUS
INTRAVENOUS | Status: DC | PRN
  Administered 2021-12-23: 160 mg via INTRAVENOUS

## 2021-12-23 MED ORDER — SILVER NITRATE-POT NITRATE 75-25 % EX MISC
CUTANEOUS | Status: AC
  Filled 2021-12-23: qty 10

## 2021-12-23 MED ORDER — LEVONORGESTREL 20 MCG/DAY IU IUD
1.0000 | INTRAUTERINE_SYSTEM | INTRAUTERINE | Status: AC
  Administered 2021-12-23: 1 via INTRAUTERINE
  Filled 2021-12-23: qty 1

## 2021-12-23 MED ORDER — LIDOCAINE HCL 1 % IJ SOLN
INTRAMUSCULAR | Status: DC | PRN
  Administered 2021-12-23: 10 mL

## 2021-12-23 MED ORDER — ONDANSETRON HCL 4 MG/2ML IJ SOLN: 4.0000 mg | Freq: Once | INTRAMUSCULAR | Status: DC | PRN

## 2021-12-23 MED ORDER — MIDAZOLAM HCL 2 MG/2ML IJ SOLN
INTRAMUSCULAR | Status: AC
  Filled 2021-12-23: qty 2

## 2021-12-23 MED ORDER — 0.9 % SODIUM CHLORIDE (POUR BTL) OPTIME
TOPICAL | Status: DC | PRN
  Administered 2021-12-23: 1000 mL

## 2021-12-23 MED ORDER — CHLORHEXIDINE GLUCONATE 0.12 % MT SOLN
15.0000 mL | Freq: Once | OROMUCOSAL | Status: AC
  Administered 2021-12-23: 15 mL via OROMUCOSAL

## 2021-12-23 MED ORDER — FENTANYL CITRATE (PF) 100 MCG/2ML IJ SOLN
INTRAMUSCULAR | Status: DC | PRN
  Administered 2021-12-23 (×4): 50 ug via INTRAVENOUS

## 2021-12-23 MED ORDER — ACETAMINOPHEN 10 MG/ML IV SOLN: 1000.0000 mg | Freq: Once | INTRAVENOUS | Status: DC | PRN

## 2021-12-23 MED ORDER — LIDOCAINE HCL (PF) 1 % IJ SOLN
INTRAMUSCULAR | Status: AC
  Filled 2021-12-23: qty 30

## 2021-12-23 MED ORDER — LIDOCAINE 2% (20 MG/ML) 5 ML SYRINGE
INTRAMUSCULAR | Status: DC | PRN
  Administered 2021-12-23: 60 mg via INTRAVENOUS

## 2021-12-23 MED ORDER — SODIUM CHLORIDE 0.9 % IR SOLN
Status: DC | PRN
  Administered 2021-12-23: 6000 mL

## 2021-12-23 MED ORDER — LACTATED RINGERS IV SOLN: INTRAVENOUS | Status: DC

## 2021-12-23 MED ORDER — OXYCODONE HCL 5 MG/5ML PO SOLN: 5.0000 mg | Freq: Once | ORAL | Status: DC | PRN

## 2021-12-23 MED ORDER — DEXAMETHASONE SODIUM PHOSPHATE 10 MG/ML IJ SOLN
INTRAMUSCULAR | Status: DC | PRN
  Administered 2021-12-23: 4 mg via INTRAVENOUS

## 2021-12-23 MED ORDER — FENTANYL CITRATE PF 50 MCG/ML IJ SOSY: 25.0000 ug | PREFILLED_SYRINGE | INTRAMUSCULAR | Status: DC | PRN

## 2021-12-23 MED ORDER — MIDAZOLAM HCL 2 MG/2ML IJ SOLN
INTRAMUSCULAR | Status: DC | PRN
  Administered 2021-12-23 (×2): 1 mg via INTRAVENOUS

## 2021-12-23 MED ORDER — ORAL CARE MOUTH RINSE: 15.0000 mL | Freq: Once | OROMUCOSAL | Status: AC

## 2021-12-23 MED ORDER — ACETIC ACID 5 % SOLN
Status: AC
  Filled 2021-12-23: qty 50

## 2021-12-23 MED ORDER — OXYCODONE HCL 5 MG PO TABS: 5.0000 mg | ORAL_TABLET | Freq: Once | ORAL | Status: DC | PRN

## 2021-12-23 MED ORDER — FERRIC SUBSULFATE 259 MG/GM EX SOLN
CUTANEOUS | Status: AC
  Filled 2021-12-23: qty 8

## 2021-12-23 SURGICAL SUPPLY — 55 items
BACTOSHIELD CHG 4% 4OZ (MISCELLANEOUS)
BAG COUNTER SPONGE SURGICOUNT (BAG) ×3 IMPLANT
BAG SPNG CNTER NS LX DISP (BAG) ×1
BIPOLAR CUTTING LOOP 21FR (ELECTRODE)
BLADE SURG SZ11 CARB STEEL (BLADE) ×3 IMPLANT
CATH ROBINSON RED A/P 16FR (CATHETERS) ×3 IMPLANT
COVER SURGICAL LIGHT HANDLE (MISCELLANEOUS) ×3 IMPLANT
DEVICE MYOSURE LITE (MISCELLANEOUS) IMPLANT
DEVICE MYOSURE REACH (MISCELLANEOUS) ×1 IMPLANT
DILATOR CANAL MILEX (MISCELLANEOUS) ×1 IMPLANT
DRAPE HYSTEROSCOPY (MISCELLANEOUS) ×1 IMPLANT
DRAPE SHEET LG 3/4 BI-LAMINATE (DRAPES) ×4 IMPLANT
DRAPE UNDERBUTTOCKS STRL (DISPOSABLE) ×2 IMPLANT
DRSG TELFA 3X8 NADH (GAUZE/BANDAGES/DRESSINGS) ×2 IMPLANT
ELECT LLETZ BALL 5MM DISP (ELECTRODE) ×1 IMPLANT
ELECT REM PT RETURN 15FT ADLT (MISCELLANEOUS) ×3 IMPLANT
GAUZE 4X4 16PLY ~~LOC~~+RFID DBL (SPONGE) ×4 IMPLANT
GLOVE SURG ENC MOIS LTX SZ6 (GLOVE) ×6 IMPLANT
GLOVE SURG ENC MOIS LTX SZ6.5 (GLOVE) ×1 IMPLANT
GOWN STRL REUS W/ TWL LRG LVL3 (GOWN DISPOSABLE) ×4 IMPLANT
GOWN STRL REUS W/TWL LRG LVL3 (GOWN DISPOSABLE) ×7 IMPLANT
HEMOSTAT SURGICEL 4X8 (HEMOSTASIS) ×1 IMPLANT
IV NS IRRIG 3000ML ARTHROMATIC (IV SOLUTION) ×3 IMPLANT
KIT BASIN OR (CUSTOM PROCEDURE TRAY) ×2 IMPLANT
KIT PROCEDURE FLUENT (KITS) ×1 IMPLANT
KIT TURNOVER KIT A (KITS) IMPLANT
LOOP CUTTING BIPOLAR 21FR (ELECTRODE) IMPLANT
MIRENA IUD ×1 IMPLANT
MYOSURE XL FIBROID (MISCELLANEOUS)
NDL SPNL 22GX3.5 QUINCKE BK (NEEDLE) ×2 IMPLANT
NEEDLE SPNL 22GX3.5 QUINCKE BK (NEEDLE) ×2 IMPLANT
NS IRRIG 1000ML POUR BTL (IV SOLUTION) ×2 IMPLANT
PACK LITHOTOMY IV (CUSTOM PROCEDURE TRAY) ×2 IMPLANT
PACK VAGINAL MINOR WOMEN LF (CUSTOM PROCEDURE TRAY) ×3 IMPLANT
PAD DRESSING TELFA 3X8 NADH (GAUZE/BANDAGES/DRESSINGS) ×2 IMPLANT
PAD OB MATERNITY 4.3X12.25 (PERSONAL CARE ITEMS) ×3 IMPLANT
PAD PREP 24X48 CUFFED NSTRL (MISCELLANEOUS) ×3 IMPLANT
PENCIL SMOKE EVACUATOR (MISCELLANEOUS) IMPLANT
SCOPETTES 8  STERILE (MISCELLANEOUS) ×2
SCOPETTES 8 STERILE (MISCELLANEOUS) ×2 IMPLANT
SCRUB CHG 4% DYNA-HEX 4OZ (MISCELLANEOUS) ×2 IMPLANT
SEAL ROD LENS SCOPE MYOSURE (ABLATOR) ×1 IMPLANT
SPONGE SURGIFOAM ABS GEL 12-7 (HEMOSTASIS) IMPLANT
SPONGE T-LAP 18X18 ~~LOC~~+RFID (SPONGE) ×2 IMPLANT
SURGILUBE 2OZ TUBE FLIPTOP (MISCELLANEOUS) ×2 IMPLANT
SUT VIC AB 0 CT1 27 (SUTURE) ×4
SUT VIC AB 0 CT1 27XBRD ANTBC (SUTURE) IMPLANT
SUT VIC AB 2-0 UR6 27 (SUTURE) IMPLANT
SUT VICRYL 0 UR6 27IN ABS (SUTURE) ×6 IMPLANT
SYR BULB IRRIG 60ML STRL (SYRINGE) ×2 IMPLANT
SYR CONTROL 10ML LL (SYRINGE) ×2 IMPLANT
SYSTEM TISS REMOVAL MYOSURE XL (MISCELLANEOUS) IMPLANT
TOWEL OR 17X26 10 PK STRL BLUE (TOWEL DISPOSABLE) ×3 IMPLANT
TOWEL OR NON WOVEN STRL DISP B (DISPOSABLE) ×3 IMPLANT
YANKAUER SUCT BULB TIP 10FT TU (MISCELLANEOUS) ×1 IMPLANT

## 2021-12-23 NOTE — Progress Notes (Signed)
Patient updated that her surgeon was delayed and surgery should start in the next 30 minutes, patient voiced understanding.

## 2021-12-23 NOTE — Interval H&P Note (Signed)
History and Physical Interval Note:  12/23/2021 2:13 PM  [place]  has presented today for surgery, with the diagnosis of COMPLEX ATYPICAL HYPERPLASIA.  The various methods of treatment have been discussed with the patient and family. After consideration of risks, benefits and other options for treatment, the patient has consented to  Procedure(s): DILATATION & CURETTAGE/HYSTEROSCOPY WITH MYOSURE (N/A) CONIZATION CERVIX WITH BIOPSY (N/A) ENDOCERVICAL CURETTAGE (N/A) INTRAUTERINE DEVICE (IUD) INSERTION MIRENA (N/A) as a surgical intervention.  The patient's history has been reviewed, patient examined, no change in status, stable for surgery.  I have reviewed the patient's chart and labs.  Questions were answered to the patient's satisfaction.     Lafonda Mosses

## 2021-12-23 NOTE — Anesthesia Preprocedure Evaluation (Signed)
Anesthesia Evaluation  Patient identified by MRN, date of birth, ID band Patient awake    Reviewed: Allergy & Precautions, NPO status , Patient's Chart, lab work & pertinent test results  Airway Mallampati: II  TM Distance: >3 FB Neck ROM: Full    Dental no notable dental hx.    Pulmonary sleep apnea , former smoker,    Pulmonary exam normal breath sounds clear to auscultation       Cardiovascular hypertension, + Peripheral Vascular Disease  Normal cardiovascular exam Rhythm:Regular Rate:Normal     Neuro/Psych negative neurological ROS  negative psych ROS   GI/Hepatic negative GI ROS, Neg liver ROS,   Endo/Other  diabetes, Type 2  Renal/GU Renal InsufficiencyRenal disease  negative genitourinary   Musculoskeletal negative musculoskeletal ROS (+)   Abdominal   Peds negative pediatric ROS (+)  Hematology negative hematology ROS (+)   Anesthesia Other Findings   Reproductive/Obstetrics negative OB ROS                             Anesthesia Physical Anesthesia Plan  ASA: 3  Anesthesia Plan: General   Post-op Pain Management: Minimal or no pain anticipated   Induction: Intravenous  PONV Risk Score and Plan: 3 and Ondansetron, Dexamethasone, Droperidol and Treatment may vary due to age or medical condition  Airway Management Planned: LMA  Additional Equipment:   Intra-op Plan:   Post-operative Plan: Extubation in OR  Informed Consent: I have reviewed the patients History and Physical, chart, labs and discussed the procedure including the risks, benefits and alternatives for the proposed anesthesia with the patient or authorized representative who has indicated his/her understanding and acceptance.     Dental advisory given  Plan Discussed with: CRNA and Surgeon  Anesthesia Plan Comments:         Anesthesia Quick Evaluation

## 2021-12-23 NOTE — Brief Op Note (Signed)
12/23/2021  4:28 PM  PATIENT:  Kristy [name]  66 y.o. female  PRE-OPERATIVE DIAGNOSIS:  COMPLEX ATYPICAL HYPERPLASIA  POST-OPERATIVE DIAGNOSIS:  COMPLEX ATYPICAL HYPERPLASIA  PROCEDURE:  Procedure(s): DILATATION & CURETTAGE/HYSTEROSCOPY WITH MYOSURE (N/A) CONIZATION CERVIX WITH BIOPSY (N/A) ENDOCERVICAL CURETTAGE (N/A) INTRAUTERINE DEVICE (IUD) INSERTION MIRENA (N/A)  SURGEON:  Surgeon(s) and Role:    Lafonda Mosses, MD - Primary  ASSISTANTS: Joylene John NP   ANESTHESIA:   general  EBL:  150 mL   BLOOD ADMINISTERED:none  DRAINS: none   LOCAL MEDICATIONS USED:  MARCAINE     SPECIMEN:  endometria curettings, ECC (pre cone), cold knife cone  DISPOSITION OF SPECIMEN:  PATHOLOGY  COUNTS:  YES  TOURNIQUET:  * No tourniquets in log *  DICTATION: .Note written in EPIC  PLAN OF CARE: Discharge to home after PACU  PATIENT DISPOSITION:  PACU - hemodynamically stable.   Delay start of Pharmacological VTE agent (>24hrs) due to surgical blood loss or risk of bleeding: not applicable

## 2021-12-23 NOTE — Discharge Instructions (Addendum)
12/23/2021  Activity: 1. Be up and out of the bed during the day.  Take a nap if needed.  You may walk up steps but be careful and use the hand rail.    2. No lifting or straining for 2-3 weeks.  3. Do Not drive if you are taking narcotic pain medicine.  4. Shower daily.  No tubs baths for 4 weeks.  5. No sexual activity and nothing in the vagina for 4 weeks.  Medications:  - Take ibuprofen and tylenol first line for pain control as needed.  Diet: 1. Low sodium Heart Healthy Diet is recommended.  2. It is safe to use a laxative if you have difficulty moving your bowels.   Reasons to call the Doctor:  Fever - Oral temperature greater than 100.4 degrees Fahrenheit Foul-smelling vaginal discharge Difficulty urinating Nausea and vomiting Difficulty breathing with or without chest pain New calf pain especially if only on one side Sudden, continuing increased vaginal bleeding with or without clots.   Follow-up: 1. You'll have a phone visit with Dr. Berline Lopes on 3/7  Contacts: For questions or concerns you should contact:  Dr. Jeral Pinch at [phone] After hours and on week-ends call [phone] and ask to speak to the physician on call for Gynecologic Oncology

## 2021-12-23 NOTE — Op Note (Addendum)
OPERATIVE NOTE  PATIENT: Kristy [name] DATE: 12/23/21  Preop Diagnosis: At least hyperplasia, suspected endometrial in origin  Postoperative Diagnosis: Uterine polyps and fibroids, benign endometrium on frozen section  Surgery: Hysteroscopy, D&C (dilation and curettage) under hysteroscopic guidance, endocervical curettage, cold knife cone, Mirena IUD insertion  Surgeons:  Valarie Cones, MD  Assistant: Joylene John, NP  Anesthesia: General   Estimated blood loss: 150 ml  IVF:  see I&O flowsheet  Urine output: n/a  Fluid deficit: 814GY  Complications: None   Pathology: endometrial curetteings  Operative findings: Normal and somewhat atrophic cervix, flush with the vagina. Multiple fibroid and polypoid appearing structures within the endometrium, some of which appear nodular with calcifications. Right tubal ostia seen, left not visualized.  Procedure: The patient was identified in the preoperative holding area. Informed consent was signed on the chart. Patient was seen history was reviewed and exam was performed.   The patient was then taken to the operating room and placed in the supine position with SCD hose on. General anesthesia was then induced without difficulty. She was then placed in the dorsolithotomy position. The perineum was prepped with Betadine. The vagina was prepped with Betadine. The patient was then draped after the prep was dried.   Timeout was performed the patient, procedure, antibiotic, allergy, and length of procedure.   The speculum was placed in the posterior vagina. The single tooth tenaculum was placed on the anterior lip of the cervix. The uterine sound was placed in the cervix and advanced to the fundus, approximately 6 cm. The cervix was successively dilated using pratts dilators to 54F. The Myosure hysteroscope was then advanced into the endometrial cavity with findings noted above. The myosure reach was used to resect most of the endometrial  findings. This was sent for frozen pathology. Picture taken in media of findings at the end of endometrial sampling.   An ECC was then performed.   When frozen section returned showing no hyperplasia or malignancy, decision made to proceed with narrow CKC and Mirena IUD placement.   The weighted speculum was placed in the posterior vagina. The right angle retractor was placed anteriorally to visualize the cervix. A 0-vicryl suture on a UR-6 needle was used to place stay sutures (which were tagged) at 3 and 9 o'clock of the cervicovaginal junction. 10cc of 1% lidocaine was infiltrated into 4 and 8 o'clock of the cervicovaginal junction. An 11 blade scalpel on a long knife handle was used to make an incision around the endocervix, inside of the stay sutures, circumferentially. An Donne Hazel was used to grasp the specimen to manipulate it. The incision was then angled towards the endocervix to amputate the specimen. It was removed, oriented with a marking stitch at the 12 o'clock ectocervix and sent to pathology.   The bovie was used at 87 coag to create hemostasis at the surgical bed.  The Mirena IUD was then inserted to the fundus, deployed, inserter was removed, and the strings cut at 4cm.   Surgicel was placed on the surgical bed and stay sutures were loosely tied together.  The vagina was irrigated.  All instrument, suture, laparotomy, Ray-Tec, and needle counts were correct x2. The patient tolerated the procedure well and was taken recovery room in stable condition.   Lafonda Mosses, MD

## 2021-12-23 NOTE — Transfer of Care (Signed)
Immediate Anesthesia Transfer of Care Note  Patient: Kristy [name]  Procedure(s) Performed: DILATATION & CURETTAGE/HYSTEROSCOPY WITH MYOSURE CONIZATION CERVIX WITH BIOPSY ENDOCERVICAL CURETTAGE INTRAUTERINE DEVICE (IUD) INSERTION MIRENA  Patient Location: PACU  Anesthesia Type:General  Level of Consciousness: awake and alert   Airway & Oxygen Therapy: Patient Spontanous Breathing and Patient connected to face mask oxygen  Post-op Assessment: Report given to RN and Post -op Vital signs reviewed and stable  Post vital signs: Reviewed and stable  Last Vitals:  Vitals Value Taken Time  BP 158/94 12/23/21 1628  Temp    Pulse 98 12/23/21 1628  Resp 13 12/23/21 1628  SpO2 97 % 12/23/21 1628  Vitals shown include unvalidated device data.  Last Pain:  Vitals:   12/23/21 1214  TempSrc:   PainSc: 0-No pain         Complications: No notable events documented.

## 2021-12-23 NOTE — Anesthesia Postprocedure Evaluation (Signed)
Anesthesia Post Note  Patient: Kristy [name]  Procedure(s) Performed: DILATATION & CURETTAGE/HYSTEROSCOPY WITH MYOSURE CONIZATION CERVIX WITH BIOPSY ENDOCERVICAL CURETTAGE INTRAUTERINE DEVICE (IUD) INSERTION MIRENA     Patient location during evaluation: PACU Anesthesia Type: General Level of consciousness: awake and alert Pain management: pain level controlled Vital Signs Assessment: post-procedure vital signs reviewed and stable Respiratory status: spontaneous breathing, nonlabored ventilation, respiratory function stable and patient connected to nasal cannula oxygen Cardiovascular status: blood pressure returned to baseline and stable Postop Assessment: no apparent nausea or vomiting Anesthetic complications: no   No notable events documented.  Last Vitals:  Vitals:   12/23/21 1700 12/23/21 1701  BP:  (!) 159/91  Pulse:  93  Resp:  16  Temp:    SpO2: 96% 97%    Last Pain:  Vitals:   12/23/21 1655  TempSrc:   PainSc: 0-No pain                 Aharon Carriere S

## 2021-12-23 NOTE — Anesthesia Procedure Notes (Signed)
Date/Time: 12/23/2021 4:18 PM Performed by: Cynda Familia, CRNA Oxygen Delivery Method: Simple face mask Placement Confirmation: positive ETCO2 and breath sounds checked- equal and bilateral Dental Injury: Teeth and Oropharynx as per pre-operative assessment

## 2021-12-23 NOTE — Anesthesia Procedure Notes (Signed)
Procedure Name: LMA Insertion Date/Time: 12/23/2021 2:54 PM Performed by: Lollie Sails, CRNA Pre-anesthesia Checklist: Patient identified, Emergency Drugs available, Suction available, Patient being monitored and Timeout performed Patient Re-evaluated:Patient Re-evaluated prior to induction Oxygen Delivery Method: Circle system utilized Preoxygenation: Pre-oxygenation with 100% oxygen Induction Type: IV induction Ventilation: Mask ventilation without difficulty LMA: LMA inserted and LMA with gastric port inserted LMA Size: 4.0 Number of attempts: 1 Placement Confirmation: positive ETCO2 and breath sounds checked- equal and bilateral Tube secured with: Tape Dental Injury: Teeth and Oropharynx as per pre-operative assessment

## 2021-12-24 ENCOUNTER — Telehealth: Payer: Self-pay

## 2021-12-24 DIAGNOSIS — N184 Chronic kidney disease, stage 4 (severe): Secondary | ICD-10-CM

## 2021-12-24 NOTE — Telephone Encounter (Signed)
Spoke with Ms. Matsuura this afternoon. Scheduled a lab appointment for a BMP to monitor kidney function post-operatively per surgical stratification form prior to surgery.  ? ?Appointment scheduled for 12/25/21 at 11:00 am. Patient is in agreement of appointment date and time.  ?

## 2021-12-24 NOTE — Telephone Encounter (Signed)
Spoke with Ms. Gauger and her daughter this afternoon. She states she is eating, drinking and urinating well. She has not had a BM yet but is passing gas. She is taking senokot and encouraged her to drink plenty of water. She denies fever or chills. She reports dark red vaginal bleeding. She is wearing a heavy pad for this and changing it 1-2 times daily. She reports the blood does not cover the pad. She is not passing clots. Advised to monitor bleeding, if bleeding becomes heavy like a period and she is changing the pad more frequently (every 2 hours) to notify our office.  Advised patient that surgicel used in surgery may come out vaginally and not to be alarmed with this. She states her pain has been fine, "I'm not in pain". Her pain is controlled with tramadol that she takes for neuropathy. Instructed she can use a heating pad if she'd like for any discomfort.  ? ?Per Joylene John, NP advised patient to avoid NSAIDS due to elevated creatinine (2.16). Patient states she does not use advil or ibuprofen she occasionally takes tylenol.    ? ?Instructed to call office with any fever, chills, purulent drainage, uncontrolled pain, increased vaginal bleeding or any other questions or concerns. Patient verbalizes understanding.  ? ?Pt aware of post op appointments as well as the office number [phone] and after hours number [phone] to call if she has any questions or concerns  ?

## 2021-12-25 ENCOUNTER — Telehealth: Payer: Self-pay | Admitting: *Deleted

## 2021-12-25 ENCOUNTER — Encounter (HOSPITAL_COMMUNITY): Payer: Self-pay | Admitting: Gynecologic Oncology

## 2021-12-25 ENCOUNTER — Inpatient Hospital Stay: Payer: Medicare HMO | Attending: Gynecologic Oncology

## 2021-12-25 ENCOUNTER — Other Ambulatory Visit: Payer: Self-pay

## 2021-12-25 DIAGNOSIS — R9389 Abnormal findings on diagnostic imaging of other specified body structures: Secondary | ICD-10-CM | POA: Insufficient documentation

## 2021-12-25 DIAGNOSIS — N184 Chronic kidney disease, stage 4 (severe): Secondary | ICD-10-CM | POA: Insufficient documentation

## 2021-12-25 DIAGNOSIS — N8502 Endometrial intraepithelial neoplasia [EIN]: Secondary | ICD-10-CM | POA: Diagnosis not present

## 2021-12-25 LAB — BASIC METABOLIC PANEL - CANCER CENTER ONLY
Anion gap: 5 (ref 5–15)
BUN: 43 mg/dL — ABNORMAL HIGH (ref 8–23)
CO2: 28 mmol/L (ref 22–32)
Calcium: 10.5 mg/dL — ABNORMAL HIGH (ref 8.9–10.3)
Chloride: 106 mmol/L (ref 98–111)
Creatinine: 2.29 mg/dL — ABNORMAL HIGH (ref 0.44–1.00)
GFR, Estimated: 23 mL/min — ABNORMAL LOW (ref >60)
Glucose, Bld: 127 mg/dL — ABNORMAL HIGH (ref 70–99)
Potassium: 5.1 mmol/L (ref 3.5–5.1)
Sodium: 139 mmol/L (ref 135–145)

## 2021-12-25 NOTE — Telephone Encounter (Signed)
Routed BMP results to Dr. Marianna Payment  ?

## 2021-12-29 ENCOUNTER — Telehealth: Payer: Self-pay

## 2021-12-29 ENCOUNTER — Telehealth: Payer: Self-pay | Admitting: *Deleted

## 2021-12-29 NOTE — Telephone Encounter (Signed)
Spoke with Lauren,RN with [facility].  She stated that Dr. Marianna Payment is a resident and is not in the office all the time. She will send him a message regarding the Creatine of 2.29 on 12-25-21. ?

## 2021-12-29 NOTE — Telephone Encounter (Signed)
Received call from Barbaraann Share, [place] with Dr. Ruta Hinds office, reporting results of post surgical BMP drawn 12/25/21.  ? ?Creatinine 2.29 up from 2.16 on 12/18/21 ? ?BUN 43 up from 40 on 12/18/21 ? ?GFR 23 down from 29 on 12/18/21 ? ?Patient has appt with PCP on 12/31/21 ?

## 2021-12-30 ENCOUNTER — Telehealth: Payer: Self-pay

## 2021-12-30 ENCOUNTER — Inpatient Hospital Stay: Payer: Medicare HMO | Admitting: Gynecologic Oncology

## 2021-12-30 ENCOUNTER — Telehealth: Payer: Self-pay | Admitting: Gynecologic Oncology

## 2021-12-30 ENCOUNTER — Telehealth: Payer: Self-pay | Admitting: *Deleted

## 2021-12-30 DIAGNOSIS — C541 Malignant neoplasm of endometrium: Secondary | ICD-10-CM

## 2021-12-30 NOTE — Telephone Encounter (Signed)
Called patient to inform her of surgery date of 01/13/22 at [facility] with Dr. Berline Lopes. Patients pre-op appt is 01/05/22 @ 3 pm. Informed her that [facility] hospital may call her for a pre-op appt there as well.  Patient and daughter aware of appts and was told to call with any questions or concerns.   ?

## 2021-12-30 NOTE — Telephone Encounter (Signed)
Discussed pathology with the patient. Given high-grade cancer, I recommend that we postpone ortho surgery and move forward with getting imaging and scheduling definitive surgery. All questions answered. I will have the office work on scheduling PET (can't do CT with IV contrast due to kidney function), pre-op appointment, and getting medical clearance for major surgery. ? ?Jeral Pinch MD ?Gynecologic Oncology ? ?

## 2021-12-30 NOTE — Telephone Encounter (Signed)
Per Dr Berline Lopes fax records and surgical optimization form to the patient's PCP, Patient already scheduled tomorrow with PCP for an appt  ?

## 2021-12-31 ENCOUNTER — Other Ambulatory Visit (HOSPITAL_COMMUNITY): Payer: Self-pay

## 2021-12-31 ENCOUNTER — Ambulatory Visit (INDEPENDENT_AMBULATORY_CARE_PROVIDER_SITE_OTHER): Payer: Medicare HMO | Admitting: Internal Medicine

## 2021-12-31 VITALS — BP 147/81 | HR 98 | Temp 98.3°F | Ht 63.0 in | Wt 218.3 lb

## 2021-12-31 DIAGNOSIS — Z0000 Encounter for general adult medical examination without abnormal findings: Secondary | ICD-10-CM

## 2021-12-31 DIAGNOSIS — M1712 Unilateral primary osteoarthritis, left knee: Secondary | ICD-10-CM

## 2021-12-31 DIAGNOSIS — N8502 Endometrial intraepithelial neoplasia [EIN]: Secondary | ICD-10-CM | POA: Diagnosis not present

## 2021-12-31 DIAGNOSIS — Z23 Encounter for immunization: Secondary | ICD-10-CM

## 2021-12-31 DIAGNOSIS — F1190 Opioid use, unspecified, uncomplicated: Secondary | ICD-10-CM | POA: Diagnosis not present

## 2021-12-31 NOTE — Progress Notes (Signed)
? ? ?Subjective:  ?CC: Osteoarthritis  ? ?HPI: ? ?[name] is a 66 y.o. female with a past medical history stated below and presents today for Osteoarthritis of left knee. Please see problem based assessment and plan for additional details. ? ?Past Medical History:  ?Diagnosis Date  ? Arthritis   ? Chronic kidney disease   ? Colon polyps   ? Colon polyps   ? Diabetes mellitus   ? Diverticulosis   ? Hyperlipidemia   ? Hypertension   ? Pneumonia due to COVID-19 virus 11/24/2020  ? Renal artery stenosis ([place]) 2007  ?  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 Albertine Patricia  ? Sleep apnea 2007  ?  status post polysomnogram 2007 , suggested use of CPAP  ? Ventricular hypertrophy 2006  ?  a 2-D echo in 2006, ejection fraction 55%, mild tricuspid regurgitation noted as well, 2-D echo November 13, 4172 showed diastolic dysfunction with LVEF normal of 65%  ? ? ?Current Outpatient Medications on File Prior to Visit  ?Medication Sig Dispense Refill  ? Accu-Chek FastClix Lancets MISC Check blood sugar two times a day (Patient taking differently: 1 each by Other route in the morning and at bedtime.) 102 each 5  ? Ascorbic Acid (VITAMIN C) 1000 MG tablet Take 1,000 mg by mouth daily.    ? aspirin 81 MG EC tablet Take 81 mg by mouth daily.    ? atorvastatin (LIPITOR) 40 MG tablet TAKE 1 TABLET (40 MG TOTAL) BY MOUTH DAILY. 90 tablet 3  ? Black Elderberry (SAMBUCUS ELDERBERRY PO) Take 1 tablet by mouth daily.    ? Blood Glucose Monitoring Suppl (ACCU-CHEK GUIDE) w/Device KIT 1 each by Does not apply route 2 (two) times daily. 1 kit 1  ? carvedilol (COREG) 12.5 MG tablet Take 1 tablet (12.5 mg total) by mouth 2 (two) times daily. 60 tablet 0  ? glucose 4 GM chewable tablet Chew 1 tablet by mouth as needed for low blood sugar.    ? glucose blood (ACCU-CHEK  GUIDE) test strip Check blood sugar two times a day (Patient taking differently: 1 each by Other route in the morning and at bedtime.) 100 each 5  ? Insulin Degludec-Liraglutide (XULTOPHY) 100-3.6 UNIT-MG/ML SOPN Inject 40 Units into the skin daily. 15 mL 5  ? Insulin Pen Needle (B-D UF III MINI PEN NEEDLES) 31G X 5 MM MISC 1 Device by Does not apply route 3 (three) times daily before meals. Check daily 3X before meals 100 each 12  ? Insulin Pen Needle (UNIFINE PENTIPS) 31G X 6 MM MISC See admin instructions. 100 each PRN  ? losartan-hydrochlorothiazide (HYZAAR) 100-25 MG tablet Take 1 tablet by mouth daily. 30 tablet 0  ? Multiple Vitamin (MULTIVITAMIN) tablet Take 1 tablet by mouth daily.    ? Probiotic Product (PROBIOTIC DAILY) CAPS Take 1 tablet by mouth daily at 12 noon.    ? spironolactone (ALDACTONE) 50 MG tablet Take 1 tablet by mouth once daily 90 tablet 0  ? traMADol (ULTRAM) 50 MG tablet TAKE 2 TABLETS BY MOUTH EVERY 6 HOURS AS NEEDED FOR SEVERE PAIN 75 tablet 0  ? Turmeric 500 MG CAPS Take 500 mg by mouth daily.    ? ?No current facility-administered medications on file prior to visit.  ? ? ?Family History  ?Problem Relation Age of Onset  ?  Diabetes Mother   ? Cancer Mother   ? Ovarian cancer Mother   ? Diabetes Father   ? Colon polyps Sister   ? Hypertension Sister   ? Breast cancer Sister   ? Colon polyps Sister   ? Hypertension Sister   ? Hypertension Daughter   ? Stroke Daughter   ? Hypertension Daughter   ? Hypertension Daughter   ? Hypertension Daughter   ? Bipolar disorder Son   ? Alcohol abuse Son   ? Drug abuse Son   ? Esophageal cancer Maternal Aunt   ? Breast cancer Cousin   ? Cancer Other   ?     breast cancer died in her 58s  ? Colon cancer Neg Hx   ? Stomach cancer Neg Hx   ? Rectal cancer Neg Hx   ? ? ?Social History  ? ?Socioeconomic History  ? Marital status: Married  ?  Spouse name: Not on file  ? Number of children: Not on file  ? Years of education: 70  ? Highest education level: Not  on file  ?Occupational History  ? Not on file  ?Tobacco Use  ? Smoking status: Former  ?  Packs/day: 1.00  ?  Years: 25.00  ?  Pack years: 25.00  ?  Types: Cigarettes  ?  Quit date: 10/26/2005  ?  Years since quitting: 16.1  ? Smokeless tobacco: Never  ?Vaping Use  ? Vaping Use: Never used  ?Substance and Sexual Activity  ? Alcohol use: No  ?  Alcohol/week: 0.0 standard drinks  ? Drug use: No  ? Sexual activity: Not Currently  ?  Partners: Male  ?  Birth control/protection: Abstinence  ?Other Topics Concern  ? Not on file  ?Social History Narrative  ? Current Social History 11/14/2020    ?   ? Patient lives with spouse in a home which is 1 story. There are steps up to the entrance with a handrail which the patient uses.   ?   ? Patient's method of transportation is personal car.  ?   ? The highest level of education was some high school, 11th grade.  ?   ? The patient currently disabled.  ?   ? Identified important Relationships are "my husband"   ?   ? Pets : a dog, Ree Kida  ?    ? Interests / Fun: "Nothing since Covid"   ?   ? Current Stressors: None   ?   ? Religious / Personal Beliefs: Non-Christian   ?   ? Other: None   ? ?Social Determinants of Health  ? ?Financial Resource Strain: Not on file  ?Food Insecurity: Not on file  ?Transportation Needs: Not on file  ?Physical Activity: Not on file  ?Stress: Not on file  ?Social Connections: Not on file  ?Intimate Partner Violence: Not on file  ? ? ?Review of Systems: ?ROS negative except for what is noted on the assessment and plan. ? ?Objective:  ? ?Vitals:  ? 12/31/21 1350 12/31/21 1411  ?BP: (!) 168/75 (!) 147/81  ?Pulse: 100 98  ?Temp: 98.3 ?F (36.8 ?C)   ?SpO2: 100%   ?Weight: 218 lb 4.8 oz (99 kg)   ?Height: 5\' 3"  (1.6 m)   ? ? ?Physical Exam: ?Gen: A&O x3 and in no apparent distress, well appearing and nourished. ?CV: RRR, no murmurs, S1/S2 presents  ?Resp: Clear to ascultation bilaterally  ?MSK: Grossly normal AROM and strength x4 extremities. Pain with motion of  left knee. No effusions, erythema or surrounding rash. No crepitus noted.  ?Skin: good skin turgor, no rashes, unusual bruising, or prominent lesions.  ? ? ?Assessment & Plan:  ?See Encounters Tab for problem based charting. ? ?Patient discussed with Dr. Evette Doffing ? ? ?Marianna Payment, D.O. ?[facility]  PGY-3 ?Pager: [phone]  Phone: [phone] ?Date 01/01/2022  Time 11:20 AM  ?

## 2021-12-31 NOTE — Telephone Encounter (Signed)
PET scheduled sccheduled for 3/15 at 7 am, patient notified. Per patient request rescheduled pre op appt with Melissa APP from 3/14 to 3/16 before hospital per op appt.  ?

## 2021-12-31 NOTE — Patient Instructions (Signed)
Thank you, [place] for allowing [place] to provide your care today. Today we discussed pain medicine and filled out a pain contract. ? ?Labs/Tests Ordered: ?- Pneumonia vaccine ?- Tdap vaccine ?- Refilled tramadol ? ?Referrals Ordered:  ?Referral Orders  ?No referral(s) requested today  ?  ? ?Medication Changes:  ?There are no discontinued medications.  ? ?No orders of the defined types were placed in this encounter. ?  ? ?Health Maintenance Screening: ?Diabetes Health Maintenance Due  ?Topic Date Due  ? LIPID PANEL  06/07/2020  ? OPHTHALMOLOGY EXAM  10/15/2021  ? FOOT EXAM  12/09/2021  ? HEMOGLOBIN A1C  03/17/2022  ?  ? ?Instructions:  ? ?Follow up: 2 months  ? ?Remember: If you have any questions or concerns, call our clinic at [phone] or after hours call [phone] and ask for the internal medicine resident on call. ? ?Marianna Payment, D.O. ?[place] ? ? ? ?

## 2022-01-01 ENCOUNTER — Other Ambulatory Visit (HOSPITAL_COMMUNITY): Payer: Self-pay

## 2022-01-01 ENCOUNTER — Encounter: Payer: Self-pay | Admitting: Internal Medicine

## 2022-01-01 ENCOUNTER — Telehealth: Payer: Self-pay | Admitting: Orthopaedic Surgery

## 2022-01-01 MED ORDER — TRAMADOL HCL 50 MG PO TABS
100.0000 mg | ORAL_TABLET | Freq: Four times a day (QID) | ORAL | 0 refills | Status: DC | PRN
  Filled 2022-01-01: qty 75, 30d supply, fill #0

## 2022-01-01 NOTE — Progress Notes (Signed)
Internal Medicine Clinic Attending  Case discussed with Dr. Coe  At the time of the visit.  We reviewed the resident's history and exam and pertinent patient test results.  I agree with the assessment, diagnosis, and plan of care documented in the resident's note.  

## 2022-01-01 NOTE — Assessment & Plan Note (Addendum)
Patient presents for further evaluation of her chronic pain 2/2 to OA of her left knee. She states that she has pain daily that limits her functionality particularly with activities outside of the house such as walking her dog and grocery shopping etc. She is currently seeing an orthopedist who have recommended left knee replacement surgery. She is currently on tramadol for her chronic pain and tolerating it well. On evaluation today, there is no erythema, effusions or crepitus. She does have increased pain with weight-bearing and motion.  ? ?Plan: ?- Continue tramadol 50 mg, take 2 tablets every 6 hours as needed #75 q month ?- Counseled patient regarding her chronic pain and use of opioid medications. Pain contract signed today.  ?- Will refill tramadol today. ?

## 2022-01-01 NOTE — Assessment & Plan Note (Signed)
Patient presents prior to undergoing a hysterectomy for high grade endometrial hyperplasia. Patient has an RCRI class IV risk, indicating a 15% risk of death, MI, or cardiac arrest. This is primary due to her history insulin dependant diabetes and Chronic kidneys disease. Her EKG shows nonspecific ST changes without pathologic q wave and she denies any current symptoms of CHF or CAD. 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  ? ?Plan: ?- Discussed her risk of surgery with the patient ?- Sign presurgical paperwork. ?

## 2022-01-01 NOTE — Telephone Encounter (Signed)
Called patient to let her know her left total knee surgery would be moved from 2nd case to 1st on 01-12-22 at [facility].  She said the surgery would need to be rescheduled to a later date because she has been scheduled for a hysterectomy.  Patient will call to reschedule once she has been cleared for surgery.  ? ?

## 2022-01-01 NOTE — Assessment & Plan Note (Signed)
Tdap and pneumonia vaccine given today. ?

## 2022-01-02 NOTE — Telephone Encounter (Signed)
Ok, thanks.

## 2022-01-03 LAB — SURGICAL PATHOLOGY

## 2022-01-05 ENCOUNTER — Encounter: Payer: Medicare HMO | Admitting: Gynecologic Oncology

## 2022-01-06 ENCOUNTER — Encounter: Payer: Self-pay | Admitting: Gynecologic Oncology

## 2022-01-07 ENCOUNTER — Ambulatory Visit (HOSPITAL_COMMUNITY)
Admission: RE | Admit: 2022-01-07 | Discharge: 2022-01-07 | Disposition: A | Discharge status: Home / Self Care | Payer: Medicare HMO | Source: Ambulatory Visit | Attending: Gynecologic Oncology | Admitting: Gynecologic Oncology

## 2022-01-07 ENCOUNTER — Telehealth: Payer: Self-pay | Admitting: Gynecologic Oncology

## 2022-01-07 ENCOUNTER — Other Ambulatory Visit: Payer: Self-pay

## 2022-01-07 DIAGNOSIS — R1903 Right lower quadrant abdominal swelling, mass and lump: Secondary | ICD-10-CM | POA: Diagnosis not present

## 2022-01-07 DIAGNOSIS — D259 Leiomyoma of uterus, unspecified: Secondary | ICD-10-CM | POA: Diagnosis not present

## 2022-01-07 DIAGNOSIS — N281 Cyst of kidney, acquired: Secondary | ICD-10-CM

## 2022-01-07 DIAGNOSIS — I999 Unspecified disorder of circulatory system: Secondary | ICD-10-CM | POA: Diagnosis not present

## 2022-01-07 DIAGNOSIS — K668 Other specified disorders of peritoneum: Secondary | ICD-10-CM | POA: Diagnosis not present

## 2022-01-07 DIAGNOSIS — N83291 Other ovarian cyst, right side: Secondary | ICD-10-CM | POA: Diagnosis not present

## 2022-01-07 DIAGNOSIS — C541 Malignant neoplasm of endometrium: Secondary | ICD-10-CM | POA: Diagnosis not present

## 2022-01-07 DIAGNOSIS — N289 Disorder of kidney and ureter, unspecified: Secondary | ICD-10-CM | POA: Diagnosis not present

## 2022-01-07 DIAGNOSIS — C539 Malignant neoplasm of cervix uteri, unspecified: Secondary | ICD-10-CM | POA: Diagnosis not present

## 2022-01-07 LAB — GLUCOSE, CAPILLARY: Glucose-Capillary: 165 mg/dL — ABNORMAL HIGH (ref 70–99)

## 2022-01-07 MED ORDER — FLUDEOXYGLUCOSE F - 18 (FDG) INJECTION
10.9000 | Freq: Once | INTRAVENOUS | Status: AC
  Administered 2022-01-07: 10.9 via INTRAVENOUS

## 2022-01-07 NOTE — Telephone Encounter (Signed)
Called patient. Discussed PET findings. Order for renal ultrasound placed.  ? ?Jeral Pinch MD ?Gynecologic Oncology ? ?

## 2022-01-08 ENCOUNTER — Other Ambulatory Visit (HOSPITAL_COMMUNITY): Payer: Self-pay

## 2022-01-08 ENCOUNTER — Other Ambulatory Visit: Payer: Self-pay

## 2022-01-08 ENCOUNTER — Inpatient Hospital Stay (HOSPITAL_BASED_OUTPATIENT_CLINIC_OR_DEPARTMENT_OTHER): Payer: Medicare HMO | Admitting: Gynecologic Oncology

## 2022-01-08 ENCOUNTER — Other Ambulatory Visit (HOSPITAL_COMMUNITY): Payer: Medicare HMO

## 2022-01-08 VITALS — BP 158/96 | HR 99 | Temp 98.6°F | Resp 20 | Wt 218.0 lb

## 2022-01-08 DIAGNOSIS — C541 Malignant neoplasm of endometrium: Secondary | ICD-10-CM

## 2022-01-08 MED ORDER — SENNOSIDES-DOCUSATE SODIUM 8.6-50 MG PO TABS
2.0000 | ORAL_TABLET | Freq: Every day | ORAL | 0 refills | Status: DC
  Filled 2022-01-08: qty 30, 15d supply, fill #0

## 2022-01-08 NOTE — Patient Instructions (Signed)
Preparing for your Surgery ? ?Plan for surgery on January 13, 2022 with Dr. Jeral Pinch at [facility] will be scheduled for robotic assisted total laparoscopic hysterectomy (removal of the uterus and cervix), bilateral salpingo-oophorectomy (removal of both ovaries and fallopian tubes), sentinel lymph node biopsy, possible lymph node dissection, possible laparotomy (larger incision on your abdomen if needed), possible omentectomy (removal of omentum).   ? ?Pre-operative Testing ?- (DONE) You will receive a phone call from presurgical testing at [facility] to arrange for a pre-operative appointment and lab work. ? ?-Bring your insurance card, copy of an advanced directive if applicable, medication list ? ?-At that visit, you will be asked to sign a consent for a possible blood transfusion in case a transfusion becomes necessary during surgery.  The need for a blood transfusion is rare but having consent is a necessary part of your care.    ? ?-You can continue taking your baby aspirin with the last dose being the day before surgery. ? ?-Do not take supplements such as fish oil (omega 3), red yeast rice, turmeric before your surgery. You want to avoid medications with aspirin in them including headache powders such as BC or Goody's), Excedrin migraine. STOP TUMERIC NOW. ? ?Day Before Surgery at Home ?-You will be asked to take in a light diet the day before surgery. You will be advised you can have clear liquids up until 3 hours before your surgery.   ? ?Eat a light diet the day before surgery.  Examples including soups, broths, toast, yogurt, mashed potatoes.  AVOID GAS PRODUCING FOODS. Things to avoid include carbonated beverages (fizzy beverages, sodas), raw fruits and raw vegetables (uncooked), or beans.  ? ?If your bowels are filled with gas, your surgeon will have difficulty visualizing your pelvic organs which increases your surgical risks. ? ?Your role in recovery ?Your role is to  become active as soon as directed by your doctor, while still giving yourself time to heal.  Rest when you feel tired. You will be asked to do the following in order to speed your recovery: ? ?- Cough and breathe deeply. This helps to clear and expand your lungs and can prevent pneumonia after surgery.  ?- STAY ACTIVE WHEN YOU GET HOME. Do mild physical activity. Walking or moving your legs help your circulation and body functions return to normal. Do not try to get up or walk alone the first time after surgery.   ?-If you develop swelling on one leg or the other, pain in the back of your leg, redness/warmth in one of your legs, please call the office or go to the Emergency Room to have a doppler to rule out a blood clot. For shortness of breath, chest pain-seek care in the Emergency Room as soon as possible. ?- Actively manage your pain. Managing your pain lets you move in comfort. We will ask you to rate your pain on a scale of zero to 10. It is your responsibility to tell your doctor or nurse where and how much you hurt so your pain can be treated. ? ?Special Considerations ?-If you are diabetic, you may be placed on insulin after surgery to have closer control over your blood sugars to promote healing and recovery.  This does not mean that you will be discharged on insulin.  If applicable, your oral antidiabetics will be resumed when you are tolerating a solid diet. ? ?-Your final pathology results from surgery should be available around one week  after surgery and the results will be relayed to you when available. ? ?-Dr. Lahoma Crocker is the surgeon that assists your GYN Oncologist with surgery.  If you end up staying the night, the next day after your surgery you will either see Dr. Berline Lopes or Dr. Lahoma Crocker. ? ?-FMLA forms can be faxed to [phone] and please allow 5-7 business days for completion. ? ?Pain Management After Surgery ?-You can continue use of tramadol at home for pain if needed.   ? ?-Make sure that you have Tylenol at home to use on a regular basis after surgery for pain control.  ? ?-Review the attached handout on narcotic use and their risks and side effects.  ? ?Bowel Regimen ?-You have been prescribed Sennakot-S to take nightly to prevent constipation especially if you are taking the narcotic pain medication intermittently.  It is important to prevent constipation and drink adequate amounts of liquids. You can stop taking this medication when you are not taking pain medication and you are back on your normal bowel routine. ? ?Risks of Surgery ?Risks of surgery are low but include bleeding, infection, damage to surrounding structures, re-operation, blood clots, and very rarely death. ? ? ?Blood Transfusion Information (For the consent to be signed before surgery) ? ?We will be checking your blood type before surgery so in case of emergencies, we will know what type of blood you would need. ? ?                                          WHAT IS A BLOOD TRANSFUSION? ? ?A transfusion is the replacement of blood or some of its parts. Blood is made up of multiple cells which provide different functions. ?Red blood cells carry oxygen and are used for blood loss replacement. ?White blood cells fight against infection. ?Platelets control bleeding. ?Plasma helps clot blood. ?Other blood products are available for specialized needs, such as hemophilia or other clotting disorders. ?BEFORE THE TRANSFUSION  ?Who gives blood for transfusions?  ?You may be able to donate blood to be used at a later date on yourself (autologous donation). ?Relatives can be asked to donate blood. This is generally not any safer than if you have received blood from a stranger. The same precautions are taken to ensure safety when a relative's blood is donated. ?Healthy volunteers who are fully evaluated to make sure their blood is safe. This is blood bank blood. ?Transfusion therapy is the safest it has ever been in the  practice of medicine. Before blood is taken from a donor, a complete history is taken to make sure that person has no history of diseases nor engages in risky social behavior (examples are intravenous drug use or sexual activity with multiple partners). The donor's travel history is screened to minimize risk of transmitting infections, such as malaria. The donated blood is tested for signs of infectious diseases, such as HIV and hepatitis. The blood is then tested to be sure it is compatible with you in order to minimize the chance of a transfusion reaction. If you or a relative donates blood, this is often done in anticipation of surgery and is not appropriate for emergency situations. It takes many days to process the donated blood. ?RISKS AND COMPLICATIONS ?Although transfusion therapy is very safe and saves many lives, the main dangers of transfusion include:  ?Getting an infectious disease. ?Developing a  transfusion reaction. This is an allergic reaction to something in the blood you were given. Every precaution is taken to prevent this. ?The decision to have a blood transfusion has been considered carefully by your caregiver before blood is given. Blood is not given unless the benefits outweigh the risks. ? ?AFTER SURGERY INSTRUCTIONS ? ?Return to work: 4-6 weeks if applicable ? ?Activity: ?1. Be up and out of the bed during the day.  Take a nap if needed.  You may walk up steps but be careful and use the hand rail.  Stair climbing will tire you more than you think, you may need to stop part way and rest.  ? ?2. No lifting or straining for 6 weeks over 10 pounds. No pushing, pulling, straining for 6 weeks. ? ?3. No driving for around 1 week(s).  Do not drive if you are taking narcotic pain medicine and make sure that your reaction time has returned.  ? ?4. You can shower as soon as the next day after surgery. Shower daily.  Use your regular soap and water (not directly on the incision) and pat your incision(s)  dry afterwards; don't rub.  No tub baths or submerging your body in water until cleared by your surgeon. If you have the soap that was given to you by pre-surgical testing that was used before surgery, you

## 2022-01-08 NOTE — Patient Instructions (Addendum)
DUE TO COVID-19 ONLY ONE VISITOR  (aged 66 and older)  IS ALLOWED TO COME WITH YOU AND STAY IN THE WAITING ROOM ONLY DURING PRE OP AND PROCEDURE.   ?**NO VISITORS ARE ALLOWED IN THE SHORT STAY AREA OR RECOVERY ROOM!!** ?  ? ?You are not required to quarantine, however you are required to wear a well-fitted mask when you are out and around people not in your household.  ?Hand Hygiene often ?Do NOT share personal items ?Notify your provider if you are in close contact with someone who has COVID or you develop fever 100.4 or greater, new onset of sneezing, cough, sore throat, shortness of breath or body aches. ? ?     ? Your procedure is scheduled on: Tuesday, 01-13-22 ? ? Report to [facility] Main Entrance ? ?  Report to admitting at 6:00 AM ? ? Call this number if you have problems the morning of surgery [phone] ? ? Follow a light diet day before surgery (avoid gas producing foods ? ? Do not eat food :After Midnight. ? ? After Midnight you may have the following liquids until 5:15 AM DAY OF SURGERY ? ?Water ?Black Coffee (sugar ok, NO MILK/CREAM OR CREAMERS)  ?Tea (sugar ok, NO MILK/CREAM OR CREAMERS) regular and decaf                             ?Plain Jell-O (NO RED)                                           ?Fruit ices (not with fruit pulp, NO RED)                                     ?Popsicles (NO RED)                                                                  ?Juice: apple, WHITE grape, WHITE cranberry ?Sports drinks like Gatorade (NO RED) ?Clear broth(vegetable,chicken,beef) ? ?             ?Drink 1 G2 drink AT 5:15 AM the morning of surgery. ? ? ?  ?The day of surgery:  ?Drink ONE (1) Pre-Surgery G2 at 5:15 AM the morning of surgery. Drink in one sitting. Do not sip.  ?This drink was given to you during your hospital  ?pre-op appointment visit. ?Nothing else to drink after completing the Pre-Surgery G2. ?  ?       If you have questions, please contact your surgeon?s office. ? ? ?FOLLOW  BOWEL PREP AND ANY ADDITIONAL PRE OP INSTRUCTIONS YOU RECEIVED FROM YOUR SURGEON'S OFFICE!!! ?  ?  ?Oral Hygiene is also important to reduce your risk of infection.                                    ?Remember - BRUSH YOUR TEETH THE MORNING OF SURGERY WITH YOUR REGULAR TOOTHPASTE ? ? Do NOT smoke  after Midnight ? ?Take these medicines the morning of surgery with A SIP OF WATER: Atorvastatin, Carvedilol.  Okay to take Tramadol if needed ? ?How to Manage Your Diabetes ?Before and After Surgery ? ?Why is it important to control my blood sugar before and after surgery? ?Improving blood sugar levels before and after surgery helps healing and can limit problems. ?A way of improving blood sugar control is eating a healthy diet by: ? Eating less sugar and carbohydrates ? Increasing activity/exercise ? Talking with your doctor about reaching your blood sugar goals ?High blood sugars (greater than 180 mg/dL) can raise your risk of infections and slow your recovery, so you will need to focus on controlling your diabetes during the weeks before surgery. ?Make sure that the doctor who takes care of your diabetes knows about your planned surgery including the date and location. ? ?How do I manage my blood sugar before surgery? ?Check your blood sugar at least 4 times a day, starting 2 days before surgery, to make sure that the level is not too high or low. ?Check your blood sugar the morning of your surgery when you wake up and every 2 hours until you get to the Short Stay unit. ?If your blood sugar is less than 70 mg/dL, you will need to treat for low blood sugar: ?Do not take insulin. ?Treat a low blood sugar (less than 70 mg/dL) with ? cup of clear juice (cranberry or apple), 4 glucose tablets, OR glucose gel. ?Recheck blood sugar in 15 minutes after treatment (to make sure it is greater than 70 mg/dL). If your blood sugar is not greater than 70 mg/dL on recheck, call [phone] for further instructions. ?Report your blood  sugar to the short stay nurse when you get to Short Stay. ? ?If you are admitted to the hospital after surgery: ?Your blood sugar will be checked by the staff and you will probably be given insulin after surgery (instead of oral diabetes medicines) to make sure you have good blood sugar levels. ?The goal for blood sugar control after surgery is 80-180 mg/dL. ? ? ?WHAT DO I DO ABOUT MY DIABETES MEDICATION? ? ?Do not take oral diabetes medicines (pills) the morning of surgery. ? ?THE DAY BEFORE SURGERY:  Take 40 units of Insulin Xultophy.  ? ?THE MORNING OF SURGERY:  Do not take Insulin. ? ?Reviewed and Endorsed by [facility] Patient Education Committee, August 2015  ?                 ?           You may not have any metal on your body including hair pins, jewelry, and body piercing ? ?           Do not wear make-up, lotions, powders, perfumes or deodorant ? ?Do not wear nail polish including gel and S&S, artificial/acrylic nails, or any other type of covering on natural nails including finger and toenails. If you have artificial nails, gel coating, etc. that needs to be removed by a nail salon please have this removed prior to surgery or surgery may need to be canceled/ delayed if the surgeon/ anesthesia feels like they are unable to be safely monitored.  ? ?Do not shave  48 hours prior to surgery.  ? ? Do not bring valuables to the hospital. [place]. ? ? Contacts, dentures or bridgework may not be worn into surgery. ?  ?Patients discharged on the  day of surgery will not be allowed to drive home.  Someone NEEDS to stay with you for the first 24 hours after anesthesia. ? ?Please read over the following fact sheets you were given: IF [place]  ? ?[place] - Preparing for Surgery ?Before surgery, you can play an important role.  Because skin is not sterile, your skin needs to be as free of germs as possible.   You can reduce the number of germs on your skin by washing with CHG (chlorahexidine gluconate) soap before surgery.  CHG is an antiseptic cleaner which kills germs and bonds with the skin to continue killing germs even after washing. ?Please DO NOT use if you have an allergy to CHG or antibacterial soaps.  If your skin becomes reddened/irritated stop using the CHG and inform your nurse when you arrive at Short Stay. ?Do not shave (including legs and underarms) for at least 48 hours prior to the first CHG shower.  You may shave your face/neck. ? ?Please follow these instructions carefully: ? 1.  Shower with CHG Soap the night before surgery and the  morning of surgery. ? 2.  If you choose to wash your hair, wash your hair first as usual with your normal  shampoo. ? 3.  After you shampoo, rinse your hair and body thoroughly to remove the shampoo.                            ? 4.  Use CHG as you would any other liquid soap.  You can apply chg directly to the skin and wash.  Gently with a scrungie or clean washcloth. ? 5.  Apply the CHG Soap to your body ONLY FROM THE NECK DOWN.   Do   not use on face/ open      ?                     Wound or open sores. Avoid contact with eyes, ears mouth and   genitals (private parts).  ?                     Production manager,  Genitals (private parts) with your normal soap. ?            6.  Wash thoroughly, paying special attention to the area where your    surgery  will be performed. ? 7.  Thoroughly rinse your body with warm water from the neck down. ? 8.  DO NOT shower/wash with your normal soap after using and rinsing off the CHG Soap. ?               9.  Pat yourself dry with a clean towel. ?           10.  Wear clean pajamas. ?           11.  Place clean sheets on your bed the night of your first shower and do not  sleep with pets. ?Day of Surgery : ?Do not apply any lotions/deodorants the morning of surgery.  Please wear clean clothes to the hospital/surgery center. ? ?FAILURE TO  FOLLOW THESE INSTRUCTIONS MAY RESULT IN THE CANCELLATION OF YOUR SURGERY ? ?PATIENT SIGNATURE_________________________________ ? ?NURSE SIGNATURE__________________________________ ? ?_________________________

## 2022-01-08 NOTE — Progress Notes (Signed)
Patient here with for a pre-operative appointment prior to her scheduled surgery on January 13, 2022. She is scheduled for robotic assisted total laparoscopic hysterectomy, bilateral salpingo-oophorectomy, sentinel lymph node biopsy, possible lymph node dissection, possible laparotomy, possible omentectomy.  She has her pre-admission testing appointment Monday am at [facility].  The surgery was discussed in detail.  See after visit summary for additional details. Visual aids used to discuss items related to surgery including the incentive spirometer, sequential compression stockings, foley catheter, IV pump, multi-modal pain regimen including tylenol, photo of the surgical robot, female reproductive system to discuss surgery in detail.    ?  ?Discussed post-op pain management in detail including the aspects of the enhanced recovery pathway. She takes chronic tramadol and is advised to use this for pain if needed. We discussed the use of tylenol post-op and to monitor for a maximum of 4,000 mg in a 24 hour period.  Also prescribed sennakot to be used after surgery and to hold if having loose stools.  She does not take NSAIDS due to kidney function. Discussed bowel regimen in detail.   ?  ?Discussed the use of heparin pre-op, SCDs, and measures to take at home to prevent DVT including frequent mobility.  Reportable signs and symptoms of DVT discussed. Post-operative instructions discussed and expectations for after surgery. Incisional care discussed as well including reportable signs and symptoms including erythema, drainage, wound separation.  ?   ?10 minutes spent with the patient.  Verbalizing understanding of material discussed. No needs or concerns voiced at the end of the visit.   Advised patient and family to call for any needs.  Advised that her post-operative medications had been prescribed and could be picked up at any time.   ? ?This appointment is included in the global surgical bundle as pre-operative  teaching and has no charge.     ?

## 2022-01-08 NOTE — Progress Notes (Addendum)
COVID swab appointment: ? ?COVID Vaccine Completed:  No ?Date COVID Vaccine completed: ?Has received booster: ?COVID vaccine manufacturer: [place]  ? ?Date of COVID positive in last 90 days:  No ? ?PCP - Marianna Payment, MD ?Cardiologist - Sherren Mocha, MD (last seen 2012) ? ?Chest x-ray - N/A ?EKG - 12-18-21 Epic ?Stress Test - N/A ?ECHO - greater than 2 years Epic ?Cardiac Cath - N/A ?Pacemaker/ICD device last checked: ?Spinal Cord Stimulator: ? ?Bowel Prep - Light diet day before surgery  ? ?Sleep Study - Yes, +sleep apnea ?CPAP - No ? ?Fasting Blood Sugar - 120 range ?Checks Blood Sugar - Once a week  ? ?Blood Thinner Instructions: ?Aspirin Instructions:  ASA 81. ?Last Dose: 01-11-22 ? ?Activity level:   Unable to climb stairs due to knee pain.  Able to perform activities of daily living without stopping and without symptoms of chest pain or shortness of breath. ? ?Anesthesia review: Malignant hypertensive heart disease, DM, OSA, renal artery stenosis.  CKD.   ? ?BP elevated at PAT 169/111 and on 169/107 on recheck.  Patient had not taken BP meds and denied headache, chest pain or SOB ? ?Patient denies shortness of breath, fever, cough and chest pain at PAT appointment ? ?Patient verbalized understanding of instructions that were given to them at the PAT appointment. Patient was also instructed that they will need to review over the PAT instructions again at home before surgery.  ?

## 2022-01-12 ENCOUNTER — Encounter (HOSPITAL_COMMUNITY): Payer: Self-pay

## 2022-01-12 ENCOUNTER — Encounter (HOSPITAL_COMMUNITY)
Admission: RE | Admit: 2022-01-12 | Discharge: 2022-01-12 | Disposition: A | Discharge status: Home / Self Care | Payer: Medicare HMO | Source: Ambulatory Visit | Attending: Gynecologic Oncology | Admitting: Gynecologic Oncology

## 2022-01-12 ENCOUNTER — Telehealth: Payer: Self-pay | Admitting: *Deleted

## 2022-01-12 ENCOUNTER — Ambulatory Visit: Admit: 2022-01-12 | Payer: Medicare HMO | Admitting: Orthopaedic Surgery

## 2022-01-12 ENCOUNTER — Other Ambulatory Visit: Payer: Self-pay

## 2022-01-12 DIAGNOSIS — Z6839 Body mass index (BMI) 39.0-39.9, adult: Secondary | ICD-10-CM | POA: Diagnosis not present

## 2022-01-12 DIAGNOSIS — I82411 Acute embolism and thrombosis of right femoral vein: Secondary | ICD-10-CM | POA: Diagnosis present

## 2022-01-12 DIAGNOSIS — E1151 Type 2 diabetes mellitus with diabetic peripheral angiopathy without gangrene: Secondary | ICD-10-CM | POA: Diagnosis not present

## 2022-01-12 DIAGNOSIS — Z95820 Peripheral vascular angioplasty status with implants and grafts: Secondary | ICD-10-CM | POA: Diagnosis not present

## 2022-01-12 DIAGNOSIS — Y763 Surgical instruments, materials and obstetric and gynecological devices (including sutures) associated with adverse incidents: Secondary | ICD-10-CM | POA: Diagnosis present

## 2022-01-12 DIAGNOSIS — N882 Stricture and stenosis of cervix uteri: Secondary | ICD-10-CM | POA: Diagnosis present

## 2022-01-12 DIAGNOSIS — N83511 Torsion of right ovary and ovarian pedicle: Secondary | ICD-10-CM | POA: Diagnosis present

## 2022-01-12 DIAGNOSIS — Z794 Long term (current) use of insulin: Secondary | ICD-10-CM | POA: Diagnosis not present

## 2022-01-12 DIAGNOSIS — S3733XA Laceration of urethra, initial encounter: Secondary | ICD-10-CM | POA: Diagnosis present

## 2022-01-12 DIAGNOSIS — Z86010 Personal history of colonic polyps: Secondary | ICD-10-CM | POA: Diagnosis not present

## 2022-01-12 DIAGNOSIS — K429 Umbilical hernia without obstruction or gangrene: Secondary | ICD-10-CM | POA: Diagnosis present

## 2022-01-12 DIAGNOSIS — I82431 Acute embolism and thrombosis of right popliteal vein: Secondary | ICD-10-CM | POA: Diagnosis present

## 2022-01-12 DIAGNOSIS — M1711 Unilateral primary osteoarthritis, right knee: Secondary | ICD-10-CM

## 2022-01-12 DIAGNOSIS — N83201 Unspecified ovarian cyst, right side: Secondary | ICD-10-CM | POA: Diagnosis not present

## 2022-01-12 DIAGNOSIS — I701 Atherosclerosis of renal artery: Secondary | ICD-10-CM | POA: Diagnosis present

## 2022-01-12 DIAGNOSIS — D259 Leiomyoma of uterus, unspecified: Secondary | ICD-10-CM | POA: Diagnosis not present

## 2022-01-12 DIAGNOSIS — E875 Hyperkalemia: Secondary | ICD-10-CM | POA: Diagnosis present

## 2022-01-12 DIAGNOSIS — D069 Carcinoma in situ of cervix, unspecified: Secondary | ICD-10-CM | POA: Diagnosis not present

## 2022-01-12 DIAGNOSIS — C541 Malignant neoplasm of endometrium: Secondary | ICD-10-CM

## 2022-01-12 DIAGNOSIS — G4733 Obstructive sleep apnea (adult) (pediatric): Secondary | ICD-10-CM | POA: Diagnosis present

## 2022-01-12 DIAGNOSIS — E1165 Type 2 diabetes mellitus with hyperglycemia: Secondary | ICD-10-CM | POA: Diagnosis present

## 2022-01-12 DIAGNOSIS — N838 Other noninflammatory disorders of ovary, fallopian tube and broad ligament: Secondary | ICD-10-CM | POA: Diagnosis present

## 2022-01-12 DIAGNOSIS — Y836 Removal of other organ (partial) (total) as the cause of abnormal reaction of the patient, or of later complication, without mention of misadventure at the time of the procedure: Secondary | ICD-10-CM | POA: Diagnosis present

## 2022-01-12 DIAGNOSIS — F418 Other specified anxiety disorders: Secondary | ICD-10-CM | POA: Diagnosis not present

## 2022-01-12 DIAGNOSIS — R079 Chest pain, unspecified: Secondary | ICD-10-CM | POA: Diagnosis not present

## 2022-01-12 DIAGNOSIS — E785 Hyperlipidemia, unspecified: Secondary | ICD-10-CM | POA: Diagnosis present

## 2022-01-12 DIAGNOSIS — Z8616 Personal history of COVID-19: Secondary | ICD-10-CM | POA: Diagnosis not present

## 2022-01-12 DIAGNOSIS — K660 Peritoneal adhesions (postprocedural) (postinfection): Secondary | ICD-10-CM | POA: Diagnosis present

## 2022-01-12 DIAGNOSIS — E1142 Type 2 diabetes mellitus with diabetic polyneuropathy: Secondary | ICD-10-CM | POA: Diagnosis present

## 2022-01-12 DIAGNOSIS — I10 Essential (primary) hypertension: Secondary | ICD-10-CM | POA: Diagnosis present

## 2022-01-12 DIAGNOSIS — R000 Tachycardia, unspecified: Secondary | ICD-10-CM | POA: Diagnosis not present

## 2022-01-12 DIAGNOSIS — J9811 Atelectasis: Secondary | ICD-10-CM | POA: Diagnosis not present

## 2022-01-12 DIAGNOSIS — Z96651 Presence of right artificial knee joint: Secondary | ICD-10-CM | POA: Diagnosis present

## 2022-01-12 HISTORY — DX: Family history of other specified conditions: Z84.89

## 2022-01-12 HISTORY — DX: Malignant neoplasm of endometrium: C54.1

## 2022-01-12 LAB — COMPREHENSIVE METABOLIC PANEL WITH GFR
ALT: 15 U/L (ref 0–44)
AST: 15 U/L (ref 15–41)
Albumin: 3.5 g/dL (ref 3.5–5.0)
Alkaline Phosphatase: 86 U/L (ref 38–126)
Anion gap: 7 (ref 5–15)
BUN: 40 mg/dL — ABNORMAL HIGH (ref 8–23)
CO2: 21 mmol/L — ABNORMAL LOW (ref 22–32)
Calcium: 9.7 mg/dL (ref 8.9–10.3)
Chloride: 110 mmol/L (ref 98–111)
Creatinine, Ser: 2.45 mg/dL — ABNORMAL HIGH (ref 0.44–1.00)
GFR, Estimated: 21 mL/min — ABNORMAL LOW
Glucose, Bld: 139 mg/dL — ABNORMAL HIGH (ref 70–99)
Potassium: 5 mmol/L (ref 3.5–5.1)
Sodium: 138 mmol/L (ref 135–145)
Total Bilirubin: 0.4 mg/dL (ref 0.3–1.2)
Total Protein: 6.9 g/dL (ref 6.5–8.1)

## 2022-01-12 LAB — GLUCOSE, CAPILLARY: Glucose-Capillary: 135 mg/dL — ABNORMAL HIGH (ref 70–99)

## 2022-01-12 SURGERY — ARTHROPLASTY, KNEE, TOTAL
Anesthesia: Spinal | Site: Knee | Laterality: Left

## 2022-01-12 NOTE — Telephone Encounter (Signed)
Telephone call to check on pre-operative status.  Patient compliant with pre-operative instructions.  Reinforced nothing to eat after midnight. Clear liquids until 0515. Pt may take her atorvastatin, and carvedilol the morning of along with the electrolyte drink before 0515.  ?Patient to arrive at 0600.  No questions or concerns voiced.  Instructed to call for any needs.  ?

## 2022-01-12 NOTE — Anesthesia Preprocedure Evaluation (Addendum)
Anesthesia Evaluation  ?Patient identified by MRN, date of birth, ID band ?Patient awake ? ? ? ?Reviewed: ?Allergy & Precautions, NPO status , Patient's Chart, lab work & pertinent test results, reviewed documented beta blocker date and time  ? ?History of Anesthesia Complications ?Negative for: history of anesthetic complications ? ?Airway ?Mallampati: I ? ?TM Distance: >3 FB ?Neck ROM: Full ? ? ? Dental ? ?(+) Edentulous Upper, Edentulous Lower ?  ?Pulmonary ?sleep apnea (does not use CPAP) , former smoker,  ?  ?breath sounds clear to auscultation ? ? ? ? ? ? Cardiovascular ?hypertension, Pt. on medications and Pt. on home beta blockers ?+ Peripheral Vascular Disease (renal artery stenosis)  ? ?Rhythm:Regular Rate:Normal ? ? ?  ?Neuro/Psych ?Anxiety Depression Peripheral neuropathy ?  ? 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 Renal/GU ?Renal InsufficiencyRenal disease  ? ?  ?Musculoskeletal ? ? Abdominal ?(+) + obese,   ?Peds ? Hematology ?negative hematology ROS ?(+)   ?Anesthesia Other Findings ?Endometrial cancer ? Reproductive/Obstetrics ? ?  ? ? ? ? ? ? ? ? ? ? ? ? ? ?  ?  ? ? ? ? ? ? ? ?Anesthesia Physical ?Anesthesia Plan ? ?ASA: 3 ? ?Anesthesia Plan: General  ? ?Post-op Pain Management: Tylenol PO (pre-op)*  ? ?Induction: Intravenous ? ?PONV Risk Score and Plan: 3 and Ondansetron, Dexamethasone and Scopolamine patch - Pre-op ? ?Airway Management Planned: Oral ETT ? ?Additional Equipment: None ? ?Intra-op Plan:  ? ?Post-operative Plan: Extubation in OR ? ?Informed Consent: I have reviewed the patients History and Physical, chart, labs and discussed the procedure including the risks, benefits and alternatives for the proposed anesthesia with the patient or authorized representative who has indicated his/her understanding and acceptance.  ? ? ? ? ? ?Plan Discussed with: CRNA and Surgeon ? ?Anesthesia Plan Comments:    ? ? ? ? ? ?Anesthesia Quick Evaluation ? ?

## 2022-01-13 ENCOUNTER — Other Ambulatory Visit: Payer: Self-pay

## 2022-01-13 ENCOUNTER — Inpatient Hospital Stay (HOSPITAL_COMMUNITY)
Admission: RE | Admit: 2022-01-13 | Discharge: 2022-01-16 | DRG: 740 | Disposition: A | Discharge status: Home / Self Care | Payer: Medicare HMO | Attending: Obstetrics & Gynecology | Admitting: Obstetrics & Gynecology

## 2022-01-13 ENCOUNTER — Encounter (HOSPITAL_COMMUNITY)
Admission: RE | Disposition: A | Discharge status: Home / Self Care | Payer: Self-pay | Source: Ambulatory Visit | Attending: Gynecologic Oncology

## 2022-01-13 ENCOUNTER — Encounter (HOSPITAL_COMMUNITY): Payer: Self-pay | Admitting: Gynecologic Oncology

## 2022-01-13 ENCOUNTER — Ambulatory Visit (HOSPITAL_BASED_OUTPATIENT_CLINIC_OR_DEPARTMENT_OTHER): Payer: Medicare HMO | Admitting: Physician Assistant

## 2022-01-13 ENCOUNTER — Ambulatory Visit (HOSPITAL_COMMUNITY): Payer: Medicare HMO | Admitting: Physician Assistant

## 2022-01-13 DIAGNOSIS — Z800 Family history of malignant neoplasm of digestive organs: Secondary | ICD-10-CM

## 2022-01-13 DIAGNOSIS — Y836 Removal of other organ (partial) (total) as the cause of abnormal reaction of the patient, or of later complication, without mention of misadventure at the time of the procedure: Secondary | ICD-10-CM | POA: Diagnosis present

## 2022-01-13 DIAGNOSIS — N83511 Torsion of right ovary and ovarian pedicle: Secondary | ICD-10-CM | POA: Diagnosis present

## 2022-01-13 DIAGNOSIS — D069 Carcinoma in situ of cervix, unspecified: Secondary | ICD-10-CM | POA: Diagnosis not present

## 2022-01-13 DIAGNOSIS — E785 Hyperlipidemia, unspecified: Secondary | ICD-10-CM | POA: Diagnosis present

## 2022-01-13 DIAGNOSIS — Z8041 Family history of malignant neoplasm of ovary: Secondary | ICD-10-CM

## 2022-01-13 DIAGNOSIS — Z86001 Personal history of in-situ neoplasm of cervix uteri: Secondary | ICD-10-CM

## 2022-01-13 DIAGNOSIS — E875 Hyperkalemia: Secondary | ICD-10-CM

## 2022-01-13 DIAGNOSIS — E1151 Type 2 diabetes mellitus with diabetic peripheral angiopathy without gangrene: Secondary | ICD-10-CM | POA: Diagnosis not present

## 2022-01-13 DIAGNOSIS — I82431 Acute embolism and thrombosis of right popliteal vein: Secondary | ICD-10-CM | POA: Diagnosis present

## 2022-01-13 DIAGNOSIS — C541 Malignant neoplasm of endometrium: Secondary | ICD-10-CM | POA: Diagnosis not present

## 2022-01-13 DIAGNOSIS — Z6839 Body mass index (BMI) 39.0-39.9, adult: Secondary | ICD-10-CM

## 2022-01-13 DIAGNOSIS — I82411 Acute embolism and thrombosis of right femoral vein: Secondary | ICD-10-CM | POA: Diagnosis present

## 2022-01-13 DIAGNOSIS — N8502 Endometrial intraepithelial neoplasia [EIN]: Secondary | ICD-10-CM

## 2022-01-13 DIAGNOSIS — Z87891 Personal history of nicotine dependence: Secondary | ICD-10-CM

## 2022-01-13 DIAGNOSIS — K219 Gastro-esophageal reflux disease without esophagitis: Secondary | ICD-10-CM

## 2022-01-13 DIAGNOSIS — I824Y1 Acute embolism and thrombosis of unspecified deep veins of right proximal lower extremity: Principal | ICD-10-CM

## 2022-01-13 DIAGNOSIS — Z780 Asymptomatic menopausal state: Secondary | ICD-10-CM

## 2022-01-13 DIAGNOSIS — N838 Other noninflammatory disorders of ovary, fallopian tube and broad ligament: Secondary | ICD-10-CM | POA: Diagnosis present

## 2022-01-13 DIAGNOSIS — I10 Essential (primary) hypertension: Secondary | ICD-10-CM | POA: Diagnosis present

## 2022-01-13 DIAGNOSIS — S3733XA Laceration of urethra, initial encounter: Secondary | ICD-10-CM | POA: Diagnosis present

## 2022-01-13 DIAGNOSIS — Z803 Family history of malignant neoplasm of breast: Secondary | ICD-10-CM

## 2022-01-13 DIAGNOSIS — K429 Umbilical hernia without obstruction or gangrene: Secondary | ICD-10-CM | POA: Diagnosis present

## 2022-01-13 DIAGNOSIS — Z8371 Family history of colonic polyps: Secondary | ICD-10-CM

## 2022-01-13 DIAGNOSIS — Z8249 Family history of ischemic heart disease and other diseases of the circulatory system: Secondary | ICD-10-CM

## 2022-01-13 DIAGNOSIS — D259 Leiomyoma of uterus, unspecified: Secondary | ICD-10-CM | POA: Diagnosis not present

## 2022-01-13 DIAGNOSIS — Z833 Family history of diabetes mellitus: Secondary | ICD-10-CM

## 2022-01-13 DIAGNOSIS — I150 Renovascular hypertension: Secondary | ICD-10-CM

## 2022-01-13 DIAGNOSIS — K660 Peritoneal adhesions (postprocedural) (postinfection): Secondary | ICD-10-CM | POA: Diagnosis present

## 2022-01-13 DIAGNOSIS — E1142 Type 2 diabetes mellitus with diabetic polyneuropathy: Secondary | ICD-10-CM

## 2022-01-13 DIAGNOSIS — Z8616 Personal history of COVID-19: Secondary | ICD-10-CM

## 2022-01-13 DIAGNOSIS — Z86010 Personal history of colonic polyps: Secondary | ICD-10-CM

## 2022-01-13 DIAGNOSIS — J9811 Atelectasis: Secondary | ICD-10-CM | POA: Diagnosis not present

## 2022-01-13 DIAGNOSIS — E1165 Type 2 diabetes mellitus with hyperglycemia: Secondary | ICD-10-CM | POA: Diagnosis present

## 2022-01-13 DIAGNOSIS — F418 Other specified anxiety disorders: Secondary | ICD-10-CM | POA: Diagnosis not present

## 2022-01-13 DIAGNOSIS — Z794 Long term (current) use of insulin: Secondary | ICD-10-CM | POA: Diagnosis not present

## 2022-01-13 DIAGNOSIS — Z823 Family history of stroke: Secondary | ICD-10-CM

## 2022-01-13 DIAGNOSIS — Z95820 Peripheral vascular angioplasty status with implants and grafts: Secondary | ICD-10-CM

## 2022-01-13 DIAGNOSIS — G4733 Obstructive sleep apnea (adult) (pediatric): Secondary | ICD-10-CM | POA: Diagnosis present

## 2022-01-13 DIAGNOSIS — R000 Tachycardia, unspecified: Secondary | ICD-10-CM | POA: Diagnosis present

## 2022-01-13 DIAGNOSIS — Z96651 Presence of right artificial knee joint: Secondary | ICD-10-CM | POA: Diagnosis present

## 2022-01-13 DIAGNOSIS — Y763 Surgical instruments, materials and obstetric and gynecological devices (including sutures) associated with adverse incidents: Secondary | ICD-10-CM | POA: Diagnosis present

## 2022-01-13 DIAGNOSIS — N83201 Unspecified ovarian cyst, right side: Secondary | ICD-10-CM | POA: Diagnosis not present

## 2022-01-13 DIAGNOSIS — I701 Atherosclerosis of renal artery: Secondary | ICD-10-CM | POA: Diagnosis present

## 2022-01-13 DIAGNOSIS — N882 Stricture and stenosis of cervix uteri: Secondary | ICD-10-CM | POA: Diagnosis present

## 2022-01-13 DIAGNOSIS — Z8719 Personal history of other diseases of the digestive system: Secondary | ICD-10-CM

## 2022-01-13 HISTORY — PX: ROBOTIC ASSISTED TOTAL HYSTERECTOMY WITH BILATERAL SALPINGO OOPHERECTOMY: SHX6086

## 2022-01-13 HISTORY — PX: SENTINEL NODE BIOPSY: SHX6608

## 2022-01-13 HISTORY — PX: LYMPH NODE DISSECTION: SHX5087

## 2022-01-13 LAB — TYPE AND SCREEN
ABO/RH(D): A POS
Antibody Screen: NEGATIVE

## 2022-01-13 LAB — GLUCOSE, CAPILLARY
Glucose-Capillary: 124 mg/dL — ABNORMAL HIGH (ref 70–99)
Glucose-Capillary: 185 mg/dL — ABNORMAL HIGH (ref 70–99)
Glucose-Capillary: 165 mg/dL — ABNORMAL HIGH (ref 70–99)
Glucose-Capillary: 217 mg/dL — ABNORMAL HIGH (ref 70–99)

## 2022-01-13 SURGERY — HYSTERECTOMY, TOTAL, ROBOT-ASSISTED, LAPAROSCOPIC, WITH BILATERAL SALPINGO-OOPHORECTOMY
Anesthesia: General | Site: Abdomen

## 2022-01-13 MED ORDER — INSULIN GLARGINE-YFGN 100 UNIT/ML ~~LOC~~ SOLN
30.0000 [IU] | Freq: Every day | SUBCUTANEOUS | Status: DC
  Administered 2022-01-13: 30 [IU] via SUBCUTANEOUS
  Filled 2022-01-13 (×2): qty 0.3

## 2022-01-13 MED ORDER — ONDANSETRON HCL 4 MG/2ML IJ SOLN
INTRAMUSCULAR | Status: AC
  Filled 2022-01-13: qty 2

## 2022-01-13 MED ORDER — HEPARIN SODIUM (PORCINE) 5000 UNIT/ML IJ SOLN
5000.0000 [IU] | INTRAMUSCULAR | Status: AC
  Administered 2022-01-13: 5000 [IU] via SUBCUTANEOUS
  Filled 2022-01-13: qty 1

## 2022-01-13 MED ORDER — DEXAMETHASONE SODIUM PHOSPHATE 10 MG/ML IJ SOLN
INTRAMUSCULAR | Status: AC
  Filled 2022-01-13: qty 1

## 2022-01-13 MED ORDER — LIDOCAINE 2% (20 MG/ML) 5 ML SYRINGE
INTRAMUSCULAR | Status: DC | PRN
  Administered 2022-01-13: 60 mg via INTRAVENOUS

## 2022-01-13 MED ORDER — KCL IN DEXTROSE-NACL 20-5-0.45 MEQ/L-%-% IV SOLN
INTRAVENOUS | Status: DC
  Filled 2022-01-13 (×2): qty 1000

## 2022-01-13 MED ORDER — FENTANYL CITRATE (PF) 100 MCG/2ML IJ SOLN
INTRAMUSCULAR | Status: AC
  Filled 2022-01-13: qty 2

## 2022-01-13 MED ORDER — LABETALOL HCL 5 MG/ML IV SOLN: 5.0000 mg | INTRAVENOUS | Status: DC | PRN

## 2022-01-13 MED ORDER — PROPOFOL 10 MG/ML IV BOLUS
INTRAVENOUS | Status: DC | PRN
  Administered 2022-01-13: 150 mg via INTRAVENOUS
  Administered 2022-01-13: 50 mg via INTRAVENOUS

## 2022-01-13 MED ORDER — BUPIVACAINE HCL 0.25 % IJ SOLN
INTRAMUSCULAR | Status: DC | PRN
  Administered 2022-01-13: 35 mL

## 2022-01-13 MED ORDER — OXYCODONE HCL 5 MG PO TABS: 5.0000 mg | ORAL_TABLET | Freq: Once | ORAL | Status: DC | PRN

## 2022-01-13 MED ORDER — PHENYLEPHRINE HCL (PRESSORS) 10 MG/ML IV SOLN
INTRAVENOUS | Status: AC
  Filled 2022-01-13: qty 1

## 2022-01-13 MED ORDER — ONDANSETRON HCL 4 MG/2ML IJ SOLN
INTRAMUSCULAR | Status: DC | PRN
  Administered 2022-01-13: 4 mg via INTRAVENOUS

## 2022-01-13 MED ORDER — SODIUM CHLORIDE (PF) 0.9 % IJ SOLN
INTRAMUSCULAR | Status: AC
  Filled 2022-01-13: qty 10

## 2022-01-13 MED ORDER — LACTATED RINGERS IR SOLN
Status: DC | PRN
  Administered 2022-01-13: 1000 mL

## 2022-01-13 MED ORDER — ACETAMINOPHEN 500 MG PO TABS: 500.0000 mg | ORAL_TABLET | ORAL | Status: DC

## 2022-01-13 MED ORDER — HYDROMORPHONE HCL 1 MG/ML IJ SOLN
INTRAMUSCULAR | Status: AC
  Filled 2022-01-13: qty 2

## 2022-01-13 MED ORDER — INSULIN DEGLUDEC 100 UNIT/ML ~~LOC~~ SOPN: 30.0000 [IU] | PEN_INJECTOR | Freq: Every day | SUBCUTANEOUS | Status: DC

## 2022-01-13 MED ORDER — DEXAMETHASONE SODIUM PHOSPHATE 10 MG/ML IJ SOLN
INTRAMUSCULAR | Status: DC | PRN
  Administered 2022-01-13: 4 mg via INTRAVENOUS

## 2022-01-13 MED ORDER — FENTANYL CITRATE (PF) 100 MCG/2ML IJ SOLN
INTRAMUSCULAR | Status: DC | PRN
  Administered 2022-01-13 (×3): 50 ug via INTRAVENOUS
  Administered 2022-01-13: 25 ug via INTRAVENOUS
  Administered 2022-01-13: 50 ug via INTRAVENOUS

## 2022-01-13 MED ORDER — BUPIVACAINE LIPOSOME 1.3 % IJ SUSP
INTRAMUSCULAR | Status: AC
  Filled 2022-01-13: qty 20

## 2022-01-13 MED ORDER — INSULIN ASPART 100 UNIT/ML IJ SOLN
0.0000 [IU] | Freq: Three times a day (TID) | INTRAMUSCULAR | Status: DC
  Administered 2022-01-13: 1 [IU] via SUBCUTANEOUS
  Administered 2022-01-14: 2 [IU] via SUBCUTANEOUS
  Administered 2022-01-14: 1 [IU] via SUBCUTANEOUS

## 2022-01-13 MED ORDER — ONDANSETRON HCL 4 MG PO TABS: 4.0000 mg | ORAL_TABLET | Freq: Four times a day (QID) | ORAL | Status: DC | PRN

## 2022-01-13 MED ORDER — HYDROMORPHONE HCL 1 MG/ML IJ SOLN
0.2500 mg | INTRAMUSCULAR | Status: DC | PRN
  Administered 2022-01-13 (×4): 0.5 mg via INTRAVENOUS

## 2022-01-13 MED ORDER — ORAL CARE MOUTH RINSE: 15.0000 mL | Freq: Once | OROMUCOSAL | Status: AC

## 2022-01-13 MED ORDER — OXYCODONE HCL 5 MG/5ML PO SOLN: 5.0000 mg | Freq: Once | ORAL | Status: DC | PRN

## 2022-01-13 MED ORDER — INDOCYANINE GREEN 25 MG IV SOLR
INTRAVENOUS | Status: DC | PRN
  Administered 2022-01-13: 5 mg

## 2022-01-13 MED ORDER — ATORVASTATIN CALCIUM 40 MG PO TABS
40.0000 mg | ORAL_TABLET | Freq: Every day | ORAL | Status: DC
  Administered 2022-01-14 – 2022-01-16 (×3): 40 mg via ORAL
  Filled 2022-01-13 (×3): qty 1

## 2022-01-13 MED ORDER — INSULIN ASPART 100 UNIT/ML IJ SOLN
0.0000 [IU] | Freq: Every day | INTRAMUSCULAR | Status: DC
  Administered 2022-01-13: 2 [IU] via SUBCUTANEOUS

## 2022-01-13 MED ORDER — ACETAMINOPHEN 500 MG PO TABS
1000.0000 mg | ORAL_TABLET | Freq: Once | ORAL | Status: AC
  Administered 2022-01-13: 1000 mg via ORAL
  Filled 2022-01-13: qty 2

## 2022-01-13 MED ORDER — PROPOFOL 10 MG/ML IV BOLUS
INTRAVENOUS | Status: AC
Start: 2022-01-13 — End: ?
  Filled 2022-01-13: qty 20

## 2022-01-13 MED ORDER — LACTATED RINGERS IV SOLN: INTRAVENOUS | Status: DC | PRN

## 2022-01-13 MED ORDER — LIDOCAINE HCL (PF) 2 % IJ SOLN
INTRAMUSCULAR | Status: AC
  Filled 2022-01-13: qty 5

## 2022-01-13 MED ORDER — ACETAMINOPHEN 500 MG PO TABS
1000.0000 mg | ORAL_TABLET | Freq: Two times a day (BID) | ORAL | Status: DC
  Administered 2022-01-13 – 2022-01-16 (×6): 1000 mg via ORAL
  Filled 2022-01-13 (×6): qty 2

## 2022-01-13 MED ORDER — ROCURONIUM BROMIDE 10 MG/ML (PF) SYRINGE
PREFILLED_SYRINGE | INTRAVENOUS | Status: DC | PRN
  Administered 2022-01-13 (×2): 10 mg via INTRAVENOUS
  Administered 2022-01-13: 60 mg via INTRAVENOUS

## 2022-01-13 MED ORDER — MEPERIDINE HCL 50 MG/ML IJ SOLN: 6.2500 mg | INTRAMUSCULAR | Status: DC | PRN

## 2022-01-13 MED ORDER — MIDAZOLAM HCL 5 MG/5ML IJ SOLN
INTRAMUSCULAR | Status: DC | PRN
  Administered 2022-01-13: 2 mg via INTRAVENOUS

## 2022-01-13 MED ORDER — TRAMADOL HCL 50 MG PO TABS
100.0000 mg | ORAL_TABLET | Freq: Two times a day (BID) | ORAL | Status: DC | PRN
  Administered 2022-01-14 – 2022-01-15 (×3): 100 mg via ORAL
  Filled 2022-01-13 (×4): qty 2

## 2022-01-13 MED ORDER — STERILE WATER FOR IRRIGATION IR SOLN
Status: DC | PRN
  Administered 2022-01-13: 1000 mL

## 2022-01-13 MED ORDER — BUPIVACAINE LIPOSOME 1.3 % IJ SUSP
INTRAMUSCULAR | Status: DC | PRN
  Administered 2022-01-13: 20 mL

## 2022-01-13 MED ORDER — HYDROMORPHONE HCL 1 MG/ML IJ SOLN: 0.5000 mg | INTRAMUSCULAR | Status: DC | PRN

## 2022-01-13 MED ORDER — MIDAZOLAM HCL 2 MG/2ML IJ SOLN
INTRAMUSCULAR | Status: AC
  Filled 2022-01-13: qty 2

## 2022-01-13 MED ORDER — CEFAZOLIN SODIUM-DEXTROSE 2-4 GM/100ML-% IV SOLN
2.0000 g | INTRAVENOUS | Status: AC
  Administered 2022-01-13: 2 g via INTRAVENOUS
  Filled 2022-01-13: qty 100

## 2022-01-13 MED ORDER — DEXAMETHASONE SODIUM PHOSPHATE 4 MG/ML IJ SOLN: 4.0000 mg | INTRAMUSCULAR | Status: DC

## 2022-01-13 MED ORDER — SUGAMMADEX SODIUM 200 MG/2ML IV SOLN
INTRAVENOUS | Status: DC | PRN
  Administered 2022-01-13: 180 mg via INTRAVENOUS

## 2022-01-13 MED ORDER — LACTATED RINGERS IV SOLN: INTRAVENOUS | Status: DC

## 2022-01-13 MED ORDER — SCOPOLAMINE 1 MG/3DAYS TD PT72
1.0000 | MEDICATED_PATCH | TRANSDERMAL | Status: DC
  Administered 2022-01-13: 1.5 mg via TRANSDERMAL
  Filled 2022-01-13: qty 1

## 2022-01-13 MED ORDER — CHLORHEXIDINE GLUCONATE 0.12 % MT SOLN
15.0000 mL | Freq: Once | OROMUCOSAL | Status: AC
  Administered 2022-01-13: 15 mL via OROMUCOSAL

## 2022-01-13 MED ORDER — MIDAZOLAM HCL 2 MG/2ML IJ SOLN: 0.5000 mg | Freq: Once | INTRAMUSCULAR | Status: DC | PRN

## 2022-01-13 MED ORDER — STERILE WATER FOR INJECTION IJ SOLN
INTRAMUSCULAR | Status: AC
  Filled 2022-01-13: qty 10

## 2022-01-13 MED ORDER — STERILE WATER FOR INJECTION IJ SOLN
INTRAMUSCULAR | Status: DC | PRN
  Administered 2022-01-13: 10 mL

## 2022-01-13 MED ORDER — ONDANSETRON HCL 4 MG/2ML IJ SOLN: 4.0000 mg | Freq: Four times a day (QID) | INTRAMUSCULAR | Status: DC | PRN

## 2022-01-13 MED ORDER — ROCURONIUM BROMIDE 10 MG/ML (PF) SYRINGE
PREFILLED_SYRINGE | INTRAVENOUS | Status: AC
  Filled 2022-01-13: qty 10

## 2022-01-13 MED ORDER — SENNOSIDES-DOCUSATE SODIUM 8.6-50 MG PO TABS
2.0000 | ORAL_TABLET | Freq: Every day | ORAL | Status: DC
  Administered 2022-01-13 – 2022-01-15 (×3): 2 via ORAL
  Filled 2022-01-13 (×3): qty 2

## 2022-01-13 MED ORDER — OXYCODONE HCL 5 MG PO TABS
5.0000 mg | ORAL_TABLET | ORAL | Status: DC | PRN
  Administered 2022-01-14: 5 mg via ORAL
  Filled 2022-01-13: qty 1

## 2022-01-13 MED ORDER — BUPIVACAINE HCL 0.25 % IJ SOLN
INTRAMUSCULAR | Status: AC
  Filled 2022-01-13: qty 1

## 2022-01-13 MED ORDER — CARVEDILOL 12.5 MG PO TABS
12.5000 mg | ORAL_TABLET | Freq: Two times a day (BID) | ORAL | Status: DC
  Administered 2022-01-13 – 2022-01-16 (×6): 12.5 mg via ORAL
  Filled 2022-01-13 (×6): qty 1

## 2022-01-13 SURGICAL SUPPLY — 77 items
ADH SKN CLS APL DERMABOND .7 (GAUZE/BANDAGES/DRESSINGS) ×3
AGENT HMST KT MTR STRL THRMB (HEMOSTASIS)
APL ESCP 34 STRL LF DISP (HEMOSTASIS)
APPLICATOR SURGIFLO ENDO (HEMOSTASIS) IMPLANT
BACTOSHIELD CHG 4% 4OZ (MISCELLANEOUS) ×1
BAG COUNTER SPONGE SURGICOUNT (BAG) IMPLANT
BAG LAPAROSCOPIC 12 15 PORT 16 (BASKET) IMPLANT
BAG RETRIEVAL 12/15 (BASKET)
BAG SPEC RTRVL LRG 6X4 10 (ENDOMECHANICALS)
BAG SPNG CNTER NS LX DISP (BAG)
BLADE SURG SZ10 CARB STEEL (BLADE) IMPLANT
COVER BACK TABLE 60X90IN (DRAPES) ×5 IMPLANT
COVER TIP SHEARS 8 DVNC (MISCELLANEOUS) ×4 IMPLANT
COVER TIP SHEARS 8MM DA VINCI (MISCELLANEOUS) ×4
DERMABOND ADVANCED (GAUZE/BANDAGES/DRESSINGS) ×1
DERMABOND ADVANCED .7 DNX12 (GAUZE/BANDAGES/DRESSINGS) ×4 IMPLANT
DRAPE ARM DVNC X/XI (DISPOSABLE) ×16 IMPLANT
DRAPE COLUMN DVNC XI (DISPOSABLE) ×4 IMPLANT
DRAPE DA VINCI XI ARM (DISPOSABLE) ×16
DRAPE DA VINCI XI COLUMN (DISPOSABLE) ×4
DRAPE SHEET LG 3/4 BI-LAMINATE (DRAPES) ×5 IMPLANT
DRAPE SURG IRRIG POUCH 19X23 (DRAPES) ×5 IMPLANT
DRSG OPSITE POSTOP 4X6 (GAUZE/BANDAGES/DRESSINGS) ×1 IMPLANT
DRSG OPSITE POSTOP 4X8 (GAUZE/BANDAGES/DRESSINGS) IMPLANT
ELECT PENCIL ROCKER SW 15FT (MISCELLANEOUS) IMPLANT
ELECT REM PT RETURN 15FT ADLT (MISCELLANEOUS) ×5 IMPLANT
GAUZE 4X4 16PLY ~~LOC~~+RFID DBL (SPONGE) ×5 IMPLANT
GLOVE SURG ENC MOIS LTX SZ6 (GLOVE) ×20 IMPLANT
GLOVE SURG ENC MOIS LTX SZ6.5 (GLOVE) ×10 IMPLANT
GOWN STRL REUS W/ TWL LRG LVL3 (GOWN DISPOSABLE) ×16 IMPLANT
GOWN STRL REUS W/TWL LRG LVL3 (GOWN DISPOSABLE) ×16
HOLDER FOLEY CATH W/STRAP (MISCELLANEOUS) IMPLANT
IRRIG SUCT STRYKERFLOW 2 WTIP (MISCELLANEOUS) ×4
IRRIGATION SUCT STRKRFLW 2 WTP (MISCELLANEOUS) ×4 IMPLANT
KIT PROCEDURE DA VINCI SI (MISCELLANEOUS)
KIT PROCEDURE DVNC SI (MISCELLANEOUS) IMPLANT
KIT TURNOVER KIT A (KITS) IMPLANT
LIGASURE IMPACT 36 18CM CVD LR (INSTRUMENTS) ×1 IMPLANT
MANIPULATOR ADVINCU DEL 3.0 PL (MISCELLANEOUS) IMPLANT
MANIPULATOR ADVINCU DEL 3.5 PL (MISCELLANEOUS) ×1 IMPLANT
MANIPULATOR UTERINE 4.5 ZUMI (MISCELLANEOUS) IMPLANT
NDL HYPO 21X1.5 SAFETY (NEEDLE) ×4 IMPLANT
NDL SPNL 18GX3.5 QUINCKE PK (NEEDLE) IMPLANT
NEEDLE HYPO 21X1.5 SAFETY (NEEDLE) ×4 IMPLANT
NEEDLE SPNL 18GX3.5 QUINCKE PK (NEEDLE) IMPLANT
OBTURATOR OPTICAL STANDARD 8MM (TROCAR) ×4
OBTURATOR OPTICAL STND 8 DVNC (TROCAR) ×3
OBTURATOR OPTICALSTD 8 DVNC (TROCAR) ×4 IMPLANT
PACK ROBOT GYN CUSTOM WL (TRAY / TRAY PROCEDURE) ×5 IMPLANT
PAD POSITIONING PINK XL (MISCELLANEOUS) ×5 IMPLANT
PORT ACCESS TROCAR AIRSEAL 12 (TROCAR) ×4 IMPLANT
PORT ACCESS TROCAR AIRSEAL 5M (TROCAR) ×1
POUCH SPECIMEN RETRIEVAL 10MM (ENDOMECHANICALS) IMPLANT
RTRCTR WOUND ALEXIS 18CM SML (INSTRUMENTS) ×4
SAVER CELL AAL HAEMONETICS (INSTRUMENTS) IMPLANT
SCRUB CHG 4% DYNA-HEX 4OZ (MISCELLANEOUS) ×4 IMPLANT
SEAL CANN UNIV 5-8 DVNC XI (MISCELLANEOUS) ×16 IMPLANT
SEAL XI 5MM-8MM UNIVERSAL (MISCELLANEOUS) ×16
SET TRI-LUMEN FLTR TB AIRSEAL (TUBING) ×5 IMPLANT
SPIKE FLUID TRANSFER (MISCELLANEOUS) ×5 IMPLANT
SPONGE T-LAP 18X18 ~~LOC~~+RFID (SPONGE) IMPLANT
SURGIFLO W/THROMBIN 8M KIT (HEMOSTASIS) IMPLANT
SUT MNCRL AB 4-0 PS2 18 (SUTURE) IMPLANT
SUT PDS AB 1 TP1 96 (SUTURE) IMPLANT
SUT VIC AB 0 CT1 27 (SUTURE)
SUT VIC AB 0 CT1 27XBRD ANTBC (SUTURE) IMPLANT
SUT VIC AB 2-0 CT1 27 (SUTURE)
SUT VIC AB 2-0 CT1 TAPERPNT 27 (SUTURE) IMPLANT
SUT VIC AB 4-0 PS2 18 (SUTURE) ×10 IMPLANT
SYR 10ML LL (SYRINGE) IMPLANT
TOWEL OR NON WOVEN STRL DISP B (DISPOSABLE) IMPLANT
TRAP SPECIMEN MUCUS 40CC (MISCELLANEOUS) IMPLANT
TRAY FOLEY MTR SLVR 16FR STAT (SET/KITS/TRAYS/PACK) ×5 IMPLANT
TROCAR XCEL NON-BLD 5MMX100MML (ENDOMECHANICALS) IMPLANT
UNDERPAD 30X36 HEAVY ABSORB (UNDERPADS AND DIAPERS) ×10 IMPLANT
WATER STERILE IRR 1000ML POUR (IV SOLUTION) ×5 IMPLANT
YANKAUER SUCT BULB TIP 10FT TU (MISCELLANEOUS) IMPLANT

## 2022-01-13 NOTE — Progress Notes (Signed)
?  Transition of Care (TOC) Screening Note ? ? ?Patient Details  ?Name: Kristy [name] [facility] ?Date of Birth: [birth year] ? ? ?Transition of Care (TOC) CM/SW Contact:    ?Namita Yearwood, LCSW ?Phone Number: ?01/13/2022, 1:38 PM ? ? ? ?Transition of Care Department [facility]) has reviewed patient and no TOC needs have been identified at this time. We will continue to monitor patient advancement through interdisciplinary progression rounds. If new patient transition needs arise, please place a TOC consult. ? ? ?

## 2022-01-13 NOTE — Transfer of Care (Signed)
Immediate Anesthesia Transfer of Care Note ? ?Patient: Kristy [name] ? ?Procedure(s) Performed: XI ROBOTIC ASSISTED TOTAL HYSTERECTOMY WITH BILATERAL SALPINGO OOPHORECTOMY mini laparotomy  OMENTECTOMY (Bilateral: Abdomen) ?SENTINEL NODE BIOPSY (Bilateral) ?LYMPH NODE DISSECTION ? ?Patient Location: PACU ? ?Anesthesia Type:General ? ?Level of Consciousness: awake, alert , oriented and patient cooperative ? ?Airway & Oxygen Therapy: Patient Spontanous Breathing and Patient connected to face mask oxygen ? ?Post-op Assessment: Report given to RN, Post -op Vital signs reviewed and stable and Patient moving all extremities ? ?Post vital signs: Reviewed and stable ? ?Last Vitals:  ?Vitals Value Taken Time  ?BP 167/143 01/13/22 1130  ?Temp    ?Pulse 79 01/13/22 1131  ?Resp 19 01/13/22 1131  ?SpO2 97 % 01/13/22 1131  ?Vitals shown include unvalidated device data. ? ?Last Pain:  ?Vitals:  ? 01/13/22 0635  ?TempSrc:   ?PainSc: 0-No pain  ?   ? ?  ? ?Complications: No notable events documented. ?

## 2022-01-13 NOTE — Op Note (Signed)
OPERATIVE NOTE ? ?Pre-operative Diagnosis: endometrial cancer high grade ? ?Post-operative Diagnosis: same, Right ovarian torsion ? ?Operation: Robotic-assisted laparoscopic total hysterectomy with bilateral salpingoophorectomy, SLN biopsy bilaterally, omentectomy, lysis of adhesions for 30 minutes, mini-lap for omental delivery and palpation of upper abdomen, repair of peri-urethral laceration. ?Extreme morbid obesity requiring additional OR personnel for positioning and retraction. Obesity made retroperitoneal visualization limited and increased the complexity of the case and necessitated additional instrumentation for retraction. Obesity related complexity increased the duration of the procedure by 30 minutes.  ? ?Surgeon: Jeral Pinch MD ? ?Assistant Surgeon: Lahoma Crocker MD (an MD assistant was necessary for tissue manipulation, management of robotic instrumentation, retraction and positioning due to the complexity of the case and hospital policies).  ? ?Anesthesia: GET ? ?Urine Output: 250 cc ? ?Operative Findings:  On EUA, 10-12 cm uterus. 3-4 cm umbilical hernia. On intra-abdominal entry, fatty appearing liver edge, normal diaphragm and stomach. Fatty and short omentum, no obvious disease. No ascites. Normal small and large bowel. Uterus 10-12 cm. Left adnexal normal appearing with some bowel epiploica adherent to the left sidewall and IP ligament. 3 cm paratubal cyst on the right. Ovary 4 cm and torsed x3. Ovary itself adherent to the omentum. Mapping successful to bilateral pelvic SLNs. No obvious adenopathy. NO pelvic evidence of disease. Normal surfaces on palpation of mid and upper abdomen.  ? ?Estimated Blood Loss:  150cc     ? ?Total IV Fluids: see I&O flowsheet ?        ?Specimens: uterus, cervix, bilateral tubes and ovaries, bilateral SLNs (right external iliac, left internal iliac), pelvic washings, omental biopsy ?        ?Complications:  None apparent; patient tolerated the procedure  well. ?        ?Disposition: PACU - hemodynamically stable. ? ?Procedure Details  ?The patient was seen in the Holding Room. The risks, benefits, complications, treatment options, and expected outcomes were discussed with the patient.  The patient concurred with the proposed plan, giving informed consent.  The site of surgery properly noted/marked. The patient was identified as Kristy [name] and the procedure verified as a Robotic-assisted hysterectomy with bilateral salpingo oophorectomy with SLN biopsy.  ? ?After induction of anesthesia, the patient was draped and prepped in the usual sterile manner. Patient was placed in supine position after anesthesia and draped and prepped in the usual sterile manner as follows: Her arms were tucked to her side with all appropriate precautions.  The shoulders were stabilized with padded shoulder blocks applied to the acromium processes.  The patient was placed in the semi-lithotomy position in [place].  The perineum and vagina were prepped with CholoraPrep. The patient was draped after the CholoraPrep had been allowed to dry for 3 minutes.  A Time Out was held and the above information confirmed. ? ?The urethra was prepped with Betadine. Foley catheter was placed.  A sterile speculum was placed in the vagina.  The cervix was grasped with a single-tooth tenaculum. '2mg'$  total of ICG was injected into the cervical stroma at 2 and 9 o'clock with 1cc injected at a 1cm and 16m depth (concentration 0.'5mg'$ /ml) in all locations. The cervix was dilated with PKennon Roundsdilators.  The 3.5 Delineator uterine manipulator with a colpotomizer ring was placed without difficulty.  A pneum occluder balloon was placed over the manipulator.  OG tube placement was confirmed and to suction.  ? ?Next, a 10 mm skin incision was made 1 cm below the subcostal margin in  the midclavicular line.  The 5 mm Optiview port and scope was used for direct entry.  Opening pressure was under 10 mm CO2.  The abdomen  was insufflated and the findings were noted as above.   At this point and all points during the procedure, the patient's intra-abdominal pressure did not exceed 15 mmHg. Next, an 8 mm skin incision was made superior to the umbilicus and a right and left port were placed about 8 cm lateral to the robot port on the right and left side.  A fourth arm was placed on the right.  The 5 mm assist trocar was exchanged for a 10-12 mm port. All ports were placed under direct visualization.  The patient was placed in steep Trendelenburg.  Bowel was folded away into the upper abdomen.  The robot was docked in the normal manner. ? ?The right and left peritoneum were opened parallel to the IP ligament to open the retroperitoneal spaces bilaterally. The round ligaments were transected. The SLN mapping was performed in bilateral pelvic basins. After identifying the ureters, the para rectal and paravesical spaces were opened up entirely with careful dissection below the level of the ureters bilaterally and to the depth of the uterine artery origin in order to skeletonize the uterine "web" and ensure visualization of all parametrial channels. The para-aortic basins were carefully exposed and evaluated for isolated para-aortic SLN's. Lymphatic channels were identified travelling to the following visualized sentinel lymph node's: right external iliac, left internal iliac. These SLN's were separated from their surrounding lymphatic tissue, removed and sent for permanent pathology. ? ?The right ovary was elevated and sharp dissection and short bursts of electrocautery were used to lyse adhesions to surround omentum  ? ?The hysterectomy was started.  The ureter was again noted to be on the medial leaf of the broad ligament.  The peritoneum above the ureter was incised and stretched and the infundibulopelvic ligament was skeletonized, cauterized and cut.  The posterior peritoneum was taken down to the level of the KOH ring.  The anterior  peritoneum was also taken down.  The bladder flap was created to the level of the KOH ring.  The uterine artery on the right side was skeletonized, cauterized and cut in the normal manner.  A similar procedure was performed on the left.  The colpotomy was made and the uterus, cervix, bilateral ovaries and tubes were amputated and delivered through the vagina.  Pedicles were inspected and excellent hemostasis was achieved.   ? ?The colpotomy at the vaginal cuff was closed with Vicryl on a CT1 needle in a running manner.  Irrigation was used and excellent hemostasis was achieved.  At this point in the procedure was completed.  Robotic instruments were removed under direct visulaization.  The robot was undocked.  ? ?With the abdomen still insufflated, the supraumbilical incision was extended to 6-8 cm and the incision carried down to and through the fascia with monopolar electrocautery. The peritoneal incision was extended. The omentum was delivered through the incision. Very little infracolic omentum was noted. An infracolic omental biopsy was performed with bipolar electrocautery.  ? ?After assuring hemostasis, the mini-lap incision was closed with two running #1 looped PDS tied at the midline. The subcutaneous tissue was irrigated and Exparel injected for local anesthesia. The subcutaneous tissue was reapproximated with 2-0 Vicryl. ? ?The fascia at the 10-12 mm port was closed with 0 Vicryl on a UR-5 needle.  The subcuticular tissue of all incisions was closed with 4-0 Vicryl  and the skin was closed with 4-0 Monocryl in a subcuticular manner.  Dermabond was applied.  Honeycomb dressing applied to mini-lap incision.  ? ?The vagina was swabbed with bleeding noted from peri-urethral laceration. This was made hemostatic with running 2-0 Vicryl. Vaginal swabbed again and minimal bleeding noted. Foley catheter was removed.  All sponge, lap and needle counts were correct x  3.  ? ?The patient was transferred to the  recovery room in stable condition. ? ?Jeral Pinch, MD ? ?

## 2022-01-13 NOTE — Interval H&P Note (Signed)
History and Physical Interval Note: Given high-risk, high-grade histology from [facility], plan for definitive surgery. Discussed with patient likely need for modified staging if she doesn't map given need to balance length/risk of surgery with benefit of staging.  ? ?01/13/2022 ?7:08 AM ? ?Kristy [name]  has presented today for surgery, with the diagnosis of HIGH GRADE ENDOMETRIAL CANCER.  The various methods of treatment have been discussed with the patient and family. After consideration of risks, benefits and other options for treatment, the patient has consented to  Procedure(s): ?XI ROBOTIC ASSISTED TOTAL HYSTERECTOMY WITH BILATERAL SALPINGO OOPHORECTOMY;POSSIBLE LAPAROTOMY, POSSIBLE ROBOTIC OMENTECTOMY (Bilateral) ?SENTINEL NODE BIOPSY (Bilateral) ?LYMPH NODE DISSECTION (N/A) as a surgical intervention.  The patient's history has been reviewed, patient examined, no change in status, stable for surgery.  I have reviewed the patient's chart and labs.  Questions were answered to the patient's satisfaction.   ? ? ?Kristy [name] ? ? ?

## 2022-01-13 NOTE — Anesthesia Postprocedure Evaluation (Signed)
Anesthesia Post Note ? ?Patient: Maripaz Mullan ? ?Procedure(s) Performed: XI ROBOTIC ASSISTED TOTAL HYSTERECTOMY WITH BILATERAL SALPINGO OOPHORECTOMY mini laparotomy  OMENTECTOMY (Bilateral: Abdomen) ?SENTINEL NODE BIOPSY (Bilateral) ?LYMPH NODE DISSECTION ? ?  ? ?Patient location during evaluation: PACU ?Anesthesia Type: General ?Level of consciousness: awake and alert, oriented and patient cooperative ?Pain management: pain level controlled ?Vital Signs Assessment: post-procedure vital signs reviewed and stable ?Respiratory status: spontaneous breathing, nonlabored ventilation and respiratory function stable ?Cardiovascular status: blood pressure returned to baseline and stable ?Postop Assessment: no apparent nausea or vomiting, able to ambulate and adequate PO intake ?Anesthetic complications: no ? ? ?No notable events documented. ? ?Last Vitals:  ?Vitals:  ? 01/13/22 1215 01/13/22 1230  ?BP: (!) 174/112 (!) 157/106  ?Pulse:  82  ?Resp:  11  ?Temp:    ?SpO2:  98%  ?  ?Last Pain:  ?Vitals:  ? 01/13/22 1200  ?TempSrc:   ?PainSc: 4   ? ? ?  ?  ?  ?  ?  ?  ? ?Jes Costales,E. Winston Sobczyk ? ? ? ? ?

## 2022-01-13 NOTE — Anesthesia Procedure Notes (Signed)
Procedure Name: Intubation ?Date/Time: 01/13/2022 7:49 AM ?Performed by: Victoriano Lain, CRNA ?Pre-anesthesia Checklist: Patient identified, Emergency Drugs available, Suction available, Patient being monitored and Timeout performed ?Patient Re-evaluated:Patient Re-evaluated prior to induction ?Oxygen Delivery Method: Circle system utilized ?Preoxygenation: Pre-oxygenation with 100% oxygen ?Induction Type: IV induction ?Ventilation: Mask ventilation without difficulty ?Laryngoscope Size: Mac and 4 ?Grade View: Grade I ?Tube type: Oral ?Tube size: 7.0 mm ?Number of attempts: 1 ?Airway Equipment and Method: Stylet ?Placement Confirmation: ETT inserted through vocal cords under direct vision, positive ETCO2 and breath sounds checked- equal and bilateral ?Secured at: 21 cm ?Tube secured with: Tape ?Dental Injury: Teeth and Oropharynx as per pre-operative assessment  ? ? ? ? ?

## 2022-01-14 ENCOUNTER — Encounter (HOSPITAL_COMMUNITY): Payer: Self-pay | Admitting: Gynecologic Oncology

## 2022-01-14 ENCOUNTER — Other Ambulatory Visit (HOSPITAL_COMMUNITY): Payer: Self-pay

## 2022-01-14 LAB — GLUCOSE, CAPILLARY
Glucose-Capillary: 181 mg/dL — ABNORMAL HIGH (ref 70–99)
Glucose-Capillary: 237 mg/dL — ABNORMAL HIGH (ref 70–99)
Glucose-Capillary: 243 mg/dL — ABNORMAL HIGH (ref 70–99)
Glucose-Capillary: 277 mg/dL — ABNORMAL HIGH (ref 70–99)
Glucose-Capillary: 169 mg/dL — ABNORMAL HIGH (ref 70–99)
Glucose-Capillary: 277 mg/dL — ABNORMAL HIGH (ref 70–99)
Glucose-Capillary: 209 mg/dL — ABNORMAL HIGH (ref 70–99)

## 2022-01-14 LAB — BASIC METABOLIC PANEL
Anion gap: 7 (ref 5–15)
Anion gap: 6 (ref 5–15)
BUN: 41 mg/dL — ABNORMAL HIGH (ref 8–23)
BUN: 43 mg/dL — ABNORMAL HIGH (ref 8–23)
CO2: 21 mmol/L — ABNORMAL LOW (ref 22–32)
CO2: 21 mmol/L — ABNORMAL LOW (ref 22–32)
Calcium: 9.6 mg/dL (ref 8.9–10.3)
Calcium: 9.5 mg/dL (ref 8.9–10.3)
Chloride: 107 mmol/L (ref 98–111)
Chloride: 107 mmol/L (ref 98–111)
Creatinine, Ser: 2.14 mg/dL — ABNORMAL HIGH (ref 0.44–1.00)
Creatinine, Ser: 2.35 mg/dL — ABNORMAL HIGH (ref 0.44–1.00)
GFR, Estimated: 25 mL/min — ABNORMAL LOW (ref >60)
GFR, Estimated: 22 mL/min — ABNORMAL LOW (ref >60)
Glucose, Bld: 212 mg/dL — ABNORMAL HIGH (ref 70–99)
Glucose, Bld: 253 mg/dL — ABNORMAL HIGH (ref 70–99)
Potassium: 5.9 mmol/L — ABNORMAL HIGH (ref 3.5–5.1)
Potassium: 5.8 mmol/L — ABNORMAL HIGH (ref 3.5–5.1)
Sodium: 135 mmol/L (ref 135–145)
Sodium: 134 mmol/L — ABNORMAL LOW (ref 135–145)

## 2022-01-14 LAB — CBC
HCT: 37.6 % (ref 36.0–46.0)
Hemoglobin: 12 g/dL (ref 12.0–15.0)
MCH: 28 pg (ref 26.0–34.0)
MCHC: 31.9 g/dL (ref 30.0–36.0)
MCV: 87.9 fL (ref 80.0–100.0)
Platelets: 202 10*3/uL (ref 150–400)
RBC: 4.28 MIL/uL (ref 3.87–5.11)
RDW: 14.4 % (ref 11.5–15.5)
WBC: 14.5 10*3/uL — ABNORMAL HIGH (ref 4.0–10.5)
nRBC: 0.1 % (ref 0.0–0.2)

## 2022-01-14 MED ORDER — LOSARTAN POTASSIUM 50 MG PO TABS: 50.0000 mg | ORAL_TABLET | Freq: Every day | ORAL | Status: DC

## 2022-01-14 MED ORDER — INSULIN ASPART 100 UNIT/ML IJ SOLN: 0.0000 [IU] | Freq: Three times a day (TID) | INTRAMUSCULAR | Status: DC

## 2022-01-14 MED ORDER — INSULIN ASPART 100 UNIT/ML IJ SOLN
0.0000 [IU] | Freq: Three times a day (TID) | INTRAMUSCULAR | Status: DC
  Administered 2022-01-14 – 2022-01-15 (×3): 1 [IU] via SUBCUTANEOUS

## 2022-01-14 MED ORDER — INSULIN DEGLUDEC-LIRAGLUTIDE 100-3.6 UNIT-MG/ML ~~LOC~~ SOPN: 40.0000 [IU] | PEN_INJECTOR | Freq: Every day | SUBCUTANEOUS | Status: DC

## 2022-01-14 MED ORDER — HEPARIN SODIUM (PORCINE) 5000 UNIT/ML IJ SOLN
5000.0000 [IU] | Freq: Three times a day (TID) | INTRAMUSCULAR | Status: DC
  Administered 2022-01-14 – 2022-01-15 (×2): 5000 [IU] via SUBCUTANEOUS
  Filled 2022-01-14 (×2): qty 1

## 2022-01-14 MED ORDER — LACTULOSE 10 GM/15ML PO SOLN
20.0000 g | Freq: Once | ORAL | Status: AC
  Administered 2022-01-14: 20 g via ORAL
  Filled 2022-01-14: qty 30

## 2022-01-14 MED ORDER — INSULIN ASPART 100 UNIT/ML IJ SOLN
5.0000 [IU] | Freq: Once | INTRAMUSCULAR | Status: AC
  Administered 2022-01-14: 5 [IU] via SUBCUTANEOUS

## 2022-01-14 MED ORDER — INSULIN ASPART 100 UNIT/ML IJ SOLN: 0.0000 [IU] | Freq: Every day | INTRAMUSCULAR | Status: DC

## 2022-01-14 MED ORDER — INSULIN DEGLUDEC-LIRAGLUTIDE 100-3.6 UNIT-MG/ML ~~LOC~~ SOPN
40.0000 [IU] | PEN_INJECTOR | SUBCUTANEOUS | Status: DC
  Administered 2022-01-14 – 2022-01-15 (×2): 40 [IU] via SUBCUTANEOUS

## 2022-01-14 MED ORDER — INSULIN ASPART 100 UNIT/ML IJ SOLN
0.0000 [IU] | Freq: Every day | INTRAMUSCULAR | Status: DC
  Administered 2022-01-14: 2 [IU] via SUBCUTANEOUS

## 2022-01-14 MED ORDER — HYDROCHLOROTHIAZIDE 12.5 MG PO TABS
12.5000 mg | ORAL_TABLET | Freq: Every day | ORAL | Status: DC
  Administered 2022-01-14 – 2022-01-16 (×3): 12.5 mg via ORAL
  Filled 2022-01-14 (×3): qty 1

## 2022-01-14 MED ORDER — HEPARIN SODIUM (PORCINE) 5000 UNIT/ML IJ SOLN
5000.0000 [IU] | Freq: Once | INTRAMUSCULAR | Status: AC
  Administered 2022-01-14: 5000 [IU] via SUBCUTANEOUS
  Filled 2022-01-14: qty 1

## 2022-01-14 MED ORDER — SODIUM POLYSTYRENE SULFONATE 15 GM/60ML PO SUSP
30.0000 g | ORAL | Status: AC
  Administered 2022-01-14: 30 g via ORAL
  Filled 2022-01-14: qty 120

## 2022-01-14 NOTE — Discharge Summary (Incomplete)
?  Physician Discharge Summary  ?Patient ID: ?Kristy [name] ?MRN: [id number] ?DOB/AGE: [birth year] 66 y.o. ? ?Admit date: 01/13/2022 ?Discharge date: 01/14/2022 ? ?Admission Diagnoses: Endometrial cancer ([place]) ? ?Discharge Diagnoses:  ?Principal Problem: ?  Endometrial cancer ([place]) ? ? ?Discharged Condition:  The patient is in good condition and stable for discharge.  ? ?Hospital Course: On 01/13/2022, the patient underwent the following: Procedure(s): XI ROBOTIC ASSISTED TOTAL HYSTERECTOMY WITH BILATERAL SALPINGO OOPHORECTOMY, mini laparotomy, OMENTECTOMY, SENTINEL NODE BIOPSY, LYMPH NODE DISSECTION. The postoperative course was uneventful.  She was discharged to home on postoperative day 1 tolerating a regular diet, passing flatus, voiding, creatinine slightly improved above baseline, hyperkalemia improved, ambulating with walker.  ? ?Consults: None ? ?Significant Diagnostic Studies: Labs ? ?Treatments: surgery: see above ? ?Discharge Exam: ?Blood pressure (!) 140/91, pulse 82, temperature 98.2 ?F (36.8 ?C), temperature source Oral, resp. rate 14, height '5\' 2"'$  (1.575 m), weight 218 lb 0.6 oz (98.9 kg), last menstrual period 01/05/2009, SpO2 96 %. ?General appearance: alert, cooperative, and no distress ?Resp: clear to auscultation bilaterally ?Cardio: regular rate and rhythm, S1, S2 normal, no murmur, click, rub or gallop ?GI: soft, non-tender; bowel sounds normal; no masses,  no organomegaly ?Extremities: extremities normal, atraumatic, no cyanosis or edema ?Incision/Wound: Lap sites to the abdomen with dermabond intact, mini laparotomy incision with op site dressing intact without drainage noted underneath. ? ?Disposition:  ?There are no questions and answers to display.  ? ? ? ? ? ? ? ?Allergies as of 01/14/2022   ?No Known Allergies ?  ?Med Rec must be completed prior to using this [facility]*** ? ? ? ? ? ? Follow-up Information   ? ? Lafonda Mosses, MD Follow up on 01/21/2022.   ?Specialty: Gynecologic  Oncology ?Why: at 4:20pm will be a PHONE call to discuss pathology results and to check in with Dr. Berline Lopes. IN PERSON visit wil be on 02/02/22 at 2:15pm at the [facility]. ?Contact information: ?[address] ?[place] [address] ?[phone] ? ? ?  ?  ? ?  ?  ? ?  ? ? ?Greater than thirty minutes were spend for face to face discharge instructions and discharge orders/summary in EPIC.  ? ?Signed: ?Leveda Kendrix D Tramell Piechota ?01/14/2022, 11:51 AM ? ? ? ?  ?

## 2022-01-14 NOTE — Progress Notes (Signed)
1 Day Post-Op Procedure(s) (LRB): ?XI ROBOTIC ASSISTED TOTAL HYSTERECTOMY WITH BILATERAL SALPINGO OOPHORECTOMY mini laparotomy  OMENTECTOMY (Bilateral) ?SENTINEL NODE BIOPSY (Bilateral) ?LYMPH NODE DISSECTION (N/A) ? ?Subjective: ?Patient reports doing well this am. Tolerating a solid diet with no nausea or emesis. No significant abdominal pain reported. She has not ambulated or been out of bed since surgery. She states she voided several times overnight through the purwick. Passing flatus. No BM reported. No concerns voiced.  ? ?Objective: ?Vital signs in last 24 hours: ?Temp:  [97.5 ?F (36.4 ?C)-98.3 ?F (36.8 ?C)] 97.5 ?F (36.4 ?C) (03/22 1424) ?Pulse Rate:  [82-88] 86 (03/22 1424) ?Resp:  [14-16] 14 (03/22 1424) ?BP: (140-172)/(91-98) 146/95 (03/22 1424) ?SpO2:  [96 %-100 %] 97 % (03/22 1424) ?Last BM Date : 01/11/22 ? ?Intake/Output from previous day: ?03/21 0701 - 03/22 0700 ?In: 3216.1 [P.O.:556; I.V.:2560.1; IV Piggyback:100] ?Out: 1100 [Urine:950; Blood:150] ? ?Physical Examination: ?General: alert, cooperative, and no distress ?Resp: clear to auscultation bilaterally ?Cardio: regular rate and rhythm, S1, S2 normal, no murmur, click, rub or gallop ?GI: soft, non-tender; bowel sounds normal; no masses,  no organomegaly and incision: lap sites to the abdomen with dermabond intact, mini laparotomy midline abdominal incision with op site dressing in place and intact with no drainage underneath. ?Extremities: extremities normal, atraumatic, no cyanosis or edema ?Purwick container with 100 of yellow urine ? ?Labs: ?WBC/Hgb/Hct/Plts:  14.5/12.0/37.6/202 (03/22 0502) BUN/Cr/glu/ALT/AST/amyl/lip:  43/2.35/--/--/--/--/-- (03/22 1131) ? ?Assessment: ?66 y.o. s/p Procedure(s): ?XI ROBOTIC ASSISTED TOTAL HYSTERECTOMY WITH BILATERAL SALPINGO OOPHORECTOMY mini laparotomy  OMENTECTOMY, SENTINEL NODE BIOPSY with LYMPH NODE DISSECTION: stable ?Pain:  Pain is well-controlled on PRN medications. ? ?Heme: Hgb 12.0 and Hct 37.6  this am. Appropriate given surgical losses and pre-operative values. ? ?CV: BP slightly elevated. Will review home meds. Continue monitoring with ordered vital sign assessments. ? ?GI:  Tolerating po: Yes. Antiemetics ordered as needed. ? ?GU: Creatinine 2.45 this am. No urine output documented overnight-occurrences only. Plan for repeat Bmet later this am.  ? ?FEN: Hyperkalemia with K+5.9- IVF discontinued. Lactulose ordered per Dr. Berline Lopes. Plan for repeat level this am after IVF discontinuation.  ? ?Endo: Diabetes mellitus Type II, under poor control..  CBG: CBG (last 3)  ?Recent Labs  ?  01/14/22 ?1420 01/14/22 ?1651 01/14/22 ?1944  ?GLUCAP 277* 169* 277*  ?  ?Prophylaxis: No SCDs in the room. Heparin ordered. ? ?Plan: ?Awaiting repeat Bmet results-lab had to be re-drawn. ?IVF discontinued early this am ?If potassium level has improved/creatinine around baseline level, plan for discharge later today if patient ambulating, continuing to tolerate diet, pain controlled, meeting milestones. ?Continue plan of care per Dr. Berline Lopes  ? ? LOS: 0 days  ? ? ?Kristy [name] D Kristy [name] ?01/14/2022, 9:38 PM ? ? ? ?   ?

## 2022-01-14 NOTE — Progress Notes (Signed)
GYN Oncology Progress Note ? ?Spoke with patient via phone and she states she is doing well. She states she uses xultophy 40 units daily at home. Advised given elevated glucose levels, we will plan to keep her overnight for continued monitoring. Am labs ordered. Home insulin reordered per Dr. Berline Lopes. No needs voiced per pt.  ?

## 2022-01-14 NOTE — Progress Notes (Signed)
GYN Oncology Progress Note ? ?Patient alert, oriented, in no acute distress, resting in bed. She is tolerating her diet with no nausea or emesis reported. She has not been out of bed since surgery. Has a purwick in place. States she was urinating through the night even though no urine output reported. Minimal abdominal pain reported. Passing flatus. No BM. No SCDs at the bedside. Awaiting repeat Bmet results to follow up on hyperkalemia. If improving, plan for discharge home.  ?

## 2022-01-15 ENCOUNTER — Telehealth: Payer: Self-pay | Admitting: Gynecologic Oncology

## 2022-01-15 ENCOUNTER — Ambulatory Visit (HOSPITAL_COMMUNITY): Payer: Medicare HMO

## 2022-01-15 ENCOUNTER — Other Ambulatory Visit: Payer: Medicare HMO

## 2022-01-15 ENCOUNTER — Other Ambulatory Visit (HOSPITAL_COMMUNITY): Payer: Self-pay

## 2022-01-15 DIAGNOSIS — R079 Chest pain, unspecified: Secondary | ICD-10-CM | POA: Diagnosis not present

## 2022-01-15 DIAGNOSIS — C541 Malignant neoplasm of endometrium: Secondary | ICD-10-CM | POA: Diagnosis present

## 2022-01-15 DIAGNOSIS — K429 Umbilical hernia without obstruction or gangrene: Secondary | ICD-10-CM | POA: Diagnosis present

## 2022-01-15 DIAGNOSIS — R000 Tachycardia, unspecified: Secondary | ICD-10-CM | POA: Diagnosis present

## 2022-01-15 DIAGNOSIS — I82431 Acute embolism and thrombosis of right popliteal vein: Secondary | ICD-10-CM | POA: Diagnosis present

## 2022-01-15 DIAGNOSIS — Y763 Surgical instruments, materials and obstetric and gynecological devices (including sutures) associated with adverse incidents: Secondary | ICD-10-CM | POA: Diagnosis present

## 2022-01-15 DIAGNOSIS — E1142 Type 2 diabetes mellitus with diabetic polyneuropathy: Secondary | ICD-10-CM | POA: Diagnosis present

## 2022-01-15 DIAGNOSIS — S3733XA Laceration of urethra, initial encounter: Secondary | ICD-10-CM | POA: Diagnosis present

## 2022-01-15 DIAGNOSIS — N83511 Torsion of right ovary and ovarian pedicle: Secondary | ICD-10-CM | POA: Diagnosis present

## 2022-01-15 DIAGNOSIS — Y836 Removal of other organ (partial) (total) as the cause of abnormal reaction of the patient, or of later complication, without mention of misadventure at the time of the procedure: Secondary | ICD-10-CM | POA: Diagnosis present

## 2022-01-15 DIAGNOSIS — I701 Atherosclerosis of renal artery: Secondary | ICD-10-CM | POA: Diagnosis present

## 2022-01-15 DIAGNOSIS — E875 Hyperkalemia: Secondary | ICD-10-CM | POA: Diagnosis present

## 2022-01-15 DIAGNOSIS — Z95820 Peripheral vascular angioplasty status with implants and grafts: Secondary | ICD-10-CM | POA: Diagnosis not present

## 2022-01-15 DIAGNOSIS — K660 Peritoneal adhesions (postprocedural) (postinfection): Secondary | ICD-10-CM | POA: Diagnosis present

## 2022-01-15 DIAGNOSIS — E785 Hyperlipidemia, unspecified: Secondary | ICD-10-CM | POA: Diagnosis present

## 2022-01-15 DIAGNOSIS — Z86010 Personal history of colonic polyps: Secondary | ICD-10-CM | POA: Diagnosis not present

## 2022-01-15 DIAGNOSIS — I10 Essential (primary) hypertension: Secondary | ICD-10-CM | POA: Diagnosis present

## 2022-01-15 DIAGNOSIS — G4733 Obstructive sleep apnea (adult) (pediatric): Secondary | ICD-10-CM | POA: Diagnosis present

## 2022-01-15 DIAGNOSIS — J9811 Atelectasis: Secondary | ICD-10-CM | POA: Diagnosis not present

## 2022-01-15 DIAGNOSIS — E1165 Type 2 diabetes mellitus with hyperglycemia: Secondary | ICD-10-CM | POA: Diagnosis present

## 2022-01-15 DIAGNOSIS — Z96651 Presence of right artificial knee joint: Secondary | ICD-10-CM | POA: Diagnosis present

## 2022-01-15 DIAGNOSIS — N882 Stricture and stenosis of cervix uteri: Secondary | ICD-10-CM | POA: Diagnosis present

## 2022-01-15 DIAGNOSIS — Z6839 Body mass index (BMI) 39.0-39.9, adult: Secondary | ICD-10-CM | POA: Diagnosis not present

## 2022-01-15 DIAGNOSIS — I82411 Acute embolism and thrombosis of right femoral vein: Secondary | ICD-10-CM | POA: Diagnosis present

## 2022-01-15 DIAGNOSIS — N838 Other noninflammatory disorders of ovary, fallopian tube and broad ligament: Secondary | ICD-10-CM | POA: Diagnosis present

## 2022-01-15 DIAGNOSIS — Z8616 Personal history of COVID-19: Secondary | ICD-10-CM | POA: Diagnosis not present

## 2022-01-15 LAB — GLUCOSE, CAPILLARY
Glucose-Capillary: 126 mg/dL — ABNORMAL HIGH (ref 70–99)
Glucose-Capillary: 133 mg/dL — ABNORMAL HIGH (ref 70–99)
Glucose-Capillary: 142 mg/dL — ABNORMAL HIGH (ref 70–99)
Glucose-Capillary: 153 mg/dL — ABNORMAL HIGH (ref 70–99)
Glucose-Capillary: 161 mg/dL — ABNORMAL HIGH (ref 70–99)
Glucose-Capillary: 198 mg/dL — ABNORMAL HIGH (ref 70–99)
Glucose-Capillary: 224 mg/dL — ABNORMAL HIGH (ref 70–99)
Glucose-Capillary: 232 mg/dL — ABNORMAL HIGH (ref 70–99)

## 2022-01-15 LAB — CBC
HCT: 39.6 % (ref 36.0–46.0)
Hemoglobin: 12.4 g/dL (ref 12.0–15.0)
MCH: 27.9 pg (ref 26.0–34.0)
MCHC: 31.3 g/dL (ref 30.0–36.0)
MCV: 89 fL (ref 80.0–100.0)
Platelets: 212 10*3/uL (ref 150–400)
RBC: 4.45 MIL/uL (ref 3.87–5.11)
RDW: 14.4 % (ref 11.5–15.5)
WBC: 12.8 10*3/uL — ABNORMAL HIGH (ref 4.0–10.5)
nRBC: 0.2 % (ref 0.0–0.2)

## 2022-01-15 LAB — CYTOLOGY - NON PAP

## 2022-01-15 LAB — BASIC METABOLIC PANEL
Anion gap: 6 (ref 5–15)
BUN: 38 mg/dL — ABNORMAL HIGH (ref 8–23)
CO2: 20 mmol/L — ABNORMAL LOW (ref 22–32)
Calcium: 9.7 mg/dL (ref 8.9–10.3)
Chloride: 110 mmol/L (ref 98–111)
Creatinine, Ser: 2.06 mg/dL — ABNORMAL HIGH (ref 0.44–1.00)
GFR, Estimated: 26 mL/min — ABNORMAL LOW (ref >60)
Glucose, Bld: 166 mg/dL — ABNORMAL HIGH (ref 70–99)
Potassium: 4.6 mmol/L (ref 3.5–5.1)
Sodium: 136 mmol/L (ref 135–145)

## 2022-01-15 LAB — HEPARIN LEVEL (UNFRACTIONATED): Heparin Unfractionated: 1.07 IU/mL — ABNORMAL HIGH (ref 0.30–0.70)

## 2022-01-15 MED ORDER — HEPARIN BOLUS VIA INFUSION
1000.0000 [IU] | Freq: Once | INTRAVENOUS | Status: AC
  Administered 2022-01-15: 1000 [IU] via INTRAVENOUS
  Filled 2022-01-15: qty 1000

## 2022-01-15 MED ORDER — FAMOTIDINE 20 MG PO TABS
10.0000 mg | ORAL_TABLET | ORAL | Status: DC
  Administered 2022-01-15: 10 mg via ORAL
  Filled 2022-01-15: qty 1

## 2022-01-15 MED ORDER — HEPARIN (PORCINE) 25000 UT/250ML-% IV SOLN
1250.0000 [IU]/h | INTRAVENOUS | Status: DC
  Administered 2022-01-15: 1250 [IU]/h via INTRAVENOUS
  Filled 2022-01-15 (×2): qty 250

## 2022-01-15 MED ORDER — HEPARIN (PORCINE) 25000 UT/250ML-% IV SOLN
1050.0000 [IU]/h | INTRAVENOUS | Status: DC
  Administered 2022-01-16: 1050 [IU]/h via INTRAVENOUS
  Filled 2022-01-15: qty 250

## 2022-01-15 NOTE — Progress Notes (Signed)
Bilateral lower extremity venous duplex has been completed. ?Preliminary results can be found in CV Proc through chart review.  ?Results were given to Joylene John NP. ? ?01/15/22 11:45 AM ?Carlos Levering RVT   ?

## 2022-01-15 NOTE — Progress Notes (Signed)
ANTICOAGULATION CONSULT NOTE - Initial Consult ? ?Pharmacy Consult for heparin ?Indication: DVT ? ?No Known Allergies ? ?Patient Measurements: ?Height: '5\' 2"'$  (157.5 cm) ?Weight: 98.9 kg (218 lb 0.6 oz) ?IBW/kg (Calculated) : 50.1 ?Heparin Dosing Weight: 73 kg ? ?Vital Signs: ?Temp: 99 ?F (37.2 ?C) (03/23 2671) ?Temp Source: Oral (03/23 0553) ?BP: 165/99 (03/23 0553) ?Pulse Rate: 102 (03/23 1120) ? ?Labs: ?Recent Labs  ?  01/14/22 ?0502 01/14/22 ?1131 01/15/22 ?0415  ?HGB 12.0  --  12.4  ?HCT 37.6  --  39.6  ?PLT 202  --  212  ?CREATININE 2.14* 2.35* 2.06*  ? ? ?Estimated Creatinine Clearance: 29.9 mL/min (A) (by C-G formula based on SCr of 2.06 mg/dL (H)). ? ? ?Medical History: ?Past Medical History:  ?Diagnosis Date  ? Arthritis   ? Chronic kidney disease   ? Colon polyps   ? Colon polyps   ? Diabetes mellitus   ? Diverticulosis   ? Endometrial cancer ([place])   ? Family history of adverse reaction to anesthesia   ? Heart attack during anesthesia  ? Hyperlipidemia   ? Hypertension   ? Pneumonia due to COVID-19 virus 11/24/2020  ? Renal artery stenosis ([place]) 2007  ?  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 Albertine Patricia  ? Sleep apnea 2007  ?  status post polysomnogram 2007 , suggested use of CPAP  ? Ventricular hypertrophy 2006  ?  a 2-D echo in 2006, ejection fraction 55%, mild tricuspid regurgitation noted as well, 2-D echo November 13, 2456 showed diastolic dysfunction with LVEF normal of 65%  ? ? ?Medications: Not on anticoagulants PTA ?-Heparin 5000 units subQ q8h ordered inpatient post-op. Last dose given 3/23 @ 0506 ? ?Assessment: ?Patient is a 55 yoF who underwent total hysterectomy with bilateral salpingo oophorectomy with sentinel node biopsy on 3/21. Pt was admitted to hospital post-op. PMH significant for CKD, baseline SCr appears to be  ~2-2.5.  ? ?Venous doppler on 3/23 revealed findings consistent with age-indeterminate DVT involving the right common femoral vein, right femoral vein, and right popliteal vein.  ? ?Pharmacy was consulted to assist with anticoagulation recommendations - I recommended IV heparin drip for now since patient is post-op and pending further PE workup with eventual transition to treatment dose apixaban given history of CKD.  ? ?Pharmacy consulted to dose heparin drip for DVT. ? ?Today, 01/15/22 ?CBC: WNL & stable post-op ?SCr 2.06, CrCl ~29 mL/min. Appears to be at baseline renal function ? ?Discussed post-op AC and bleed risk with MD - MD would like reduced bolus (1/2 of recommended bolus) for now. ? ?Goal of Therapy:  ?Heparin level 0.3-0.7 units/ml ?Monitor platelets by anticoagulation protocol: Yes ?  ?Plan:  ?Give 1000 units bolus x 1 (50% reduction from [place]) ?Start heparin infusion at 1250 units/hr ?Check HL level in 8 hours and daily while on heparin ?CBC daily ?Follow for eventual transition to apixaban. Anticipated co-pay for 30 day supply of Eliquis is $45. Pharmacy to provide manufacturer coupon and medication counseling prior to discharge.  ? ?Lenis Noon, PharmD ?01/15/2022,12:56 PM ?

## 2022-01-15 NOTE — Progress Notes (Signed)
GYN Oncology Progress Note ? ?Called to check in on patient. She states she is "doing alright but has lost her appetite." No nausea or emesis. She has not been out of bed. Discussed CBC and EKG results. Given new tachycardia, Dr. Berline Lopes would like to obtain a lower extrem doppler to rule out DVT since she is at high risk (cancer diagnosis, recent major surgery, immobility, multiple co-morbidities). Patient agreeable with the plan. Also continue to recommend patient get out of bed to the chair with monitoring of heart rate and pulse ox during this.    ?

## 2022-01-15 NOTE — Progress Notes (Addendum)
GYN Oncology Progress Note ? ?Patient up in the chair in no acute distress, RN and tech at bedside. HR currently at 104 with pulse ox at 96% on RA. Patient reports mild nausea with no emesis, decreased appetite. No abdominal pain reported. Denies chest pain, dyspnea with movement and at rest, lightheadedness.  ? ?Alert, oriented, in no distress. Lungs diminished- O2 96% on RA. Tachycardic at 104-105 bpm. Abdomen soft, active bowel sounds. No lower extremity edema, erythema   ? ?Discussed recent doppler results showing a DVT with plan to proceed with chest xray, possible VQ scan based on chest xray results. This plan discussed with radiologist by Dr. Berline Lopes. Also advised patient that anticoagulation would be started and she would be on this at discharge. Pharmacy consult placed for assistance with anticoagulation selection given chronic kidney disease. Patient verbalizing understanding and no needs voiced.  ? ?Update: ?Plan for heparin drip per pharmacy for next 24 hours with plan for transition to Eliquis BID after that time. Based on chest xray results (atelectasis), will hold on VQ scan (atelectasis making it hard to identify PE on VQ scan per radiologist) at this time per Dr. Berline Lopes.  ? ? ?

## 2022-01-15 NOTE — Progress Notes (Addendum)
2 Days Post-Op Procedure(s) (LRB): ?XI ROBOTIC ASSISTED TOTAL HYSTERECTOMY WITH BILATERAL SALPINGO OOPHORECTOMY mini laparotomy  OMENTECTOMY (Bilateral) ?SENTINEL NODE BIOPSY (Bilateral) ?LYMPH NODE DISSECTION (N/A) ? ?Subjective: ?Patient reports doing well this am. Continuing to tolerate a solid diet with no nausea or emesis. No significant abdominal pain reported. She was only able to get to the chair yesterday due to her "bad knee" which is a chronic issue. She is continuing to void without difficulty through the purwick. Passing flatus. No BM reported. No vaginal bleeding reported. No concerns voiced. Denies chest pain, dyspnea, lightheadedness, dizziness.  ? ?Objective: ?Vital signs in last 24 hours: ?Temp:  [97.5 ?F (36.4 ?C)-99 ?F (37.2 ?C)] 99 ?F (37.2 ?C) (03/23 1700) ?Pulse Rate:  [82-104] 104 (03/23 0553) ?Resp:  [14-18] 18 (03/23 0553) ?BP: (140-187)/(91-110) 165/99 (03/23 0553) ?SpO2:  [96 %-99 %] 97 % (03/23 0553) ?Last BM Date : 01/11/22 ? ?Intake/Output from previous day: ?03/22 0701 - 03/23 0700 ?In: 480 [P.O.:480] ?Out: 900 [Urine:900] ? ?Physical Examination (performed by Dr. Berline Lopes): ?General: alert, cooperative, and no distress ?Resp: clear to auscultation bilaterally ?Cardio: Tachycardic at 104-108 bpm. Regular rhythm. ?GI: soft, non-tender; bowel sounds normal; no masses,  no organomegaly and incision: lap sites to the abdomen with dermabond intact, mini laparotomy midline abdominal incision with op site dressing in place and intact with no drainage underneath. ?Extremities: extremities normal, atraumatic, no cyanosis or edema ?Purwick container with minimal amount of yellow urine ?Abdominal binder in place. SCDs on. ? ?Labs: ?  BUN/Cr/glu/ALT/AST/amyl/lip:  38/2.06/--/--/--/--/-- (03/23 0415) ? ?Assessment: ?66 y.o. s/p Procedure(s): ?XI ROBOTIC ASSISTED TOTAL HYSTERECTOMY WITH BILATERAL SALPINGO OOPHORECTOMY mini laparotomy  OMENTECTOMY, SENTINEL NODE BIOPSY with LYMPH NODE DISSECTION:  stable ?Pain:  Pain is well-controlled on PRN medications. ? ?Heme: Hgb 12.0 and Hct 37.6 01/14/22 am. Appropriate given surgical losses and pre-operative values. Plan for repeat CBC this am given new tachycardia. ? ?CV: BP slightly elevated- HCTZ reordered 01/14/22. New tachycardia- plan for CBC and EKG this am. Continue monitoring with ordered vital sign assessments. ? ?GI:  Tolerating po: Yes. Antiemetics ordered as needed. ? ?GU: Creatinine improved to 2.06 this am from 2.45 01/14/22 am.  ? ?FEN: Hyperkalemia improved with K+ at 4.6 from K+5.9- IVF discontinued 01/14/22. Lactulose and kayexalate given 01/14/22. ? ?Endo: Diabetes mellitus Type II, under poor control..  CBG: CBG (last 3)  ?Recent Labs  ?  01/14/22 ?2228 01/15/22 ?0001 01/15/22 ?0403  ?GLUCAP 209* 198* 232*  ?  ?Prophylaxis: SCDs on. Heparin ordered. ? ?Plan: ?-Add on CBC ?-EKG given new tachycardia ?-Heparin on hold while waiting for CBC results ?-Plan for possible discharge later today if patient ambulating, continuing to tolerate diet, pain controlled, meeting milestones, tachycardia improves ?-Continue plan of care per Dr. Berline Lopes  ? ? LOS: 0 days  ? ? ?Mohamadou Maciver D Kimi Kroft ?01/15/2022, 7:31 AM ? ? ? ?   ?

## 2022-01-15 NOTE — Telephone Encounter (Signed)
With the patient's permission, I called her daughter to give her an update.  Discussed new diagnosis of lower extremity DVT, treatment plan for heparin drip for 24 hours and then hopefully transition to an oral anticoagulant.  Tentatively, if the patient remains stable with no evidence of bleeding, she could go home as early as tomorrow later in the day or Saturday.  All questions answered. ? ?Jeral Pinch MD ?Gynecologic Oncology ? ?

## 2022-01-15 NOTE — Discharge Instructions (Addendum)
AFTER SURGERY INSTRUCTIONS ?  ?Return to work: 4-6 weeks if applicable ? ?YOU HAVE BEEN STARTED ON ELIQUIS 2.5 MG TWICE A DAY DUE TO THE BLOOD CLOT FOUND IN YOUR LEG DURING [facility] STAY. YOU RECEIVED A DOSE IN THE HOSPITAL TODAY AND WILL NEED TO TAKE AN DOSE TONIGHT AT BEDTIME. IT IS ALSO RECOMMENDED TO CONTINUE TAKING YOUR BABY ASPIRIN. ? ?SINCE YOU WILL BE ON BLOOD THINNERS, MONITOR FOR ABNORMAL BLEEDING AND CALL THE OFFICE IF YOU NOTICE THIS. ? ?You will have a white honeycomb dressing over your larger incision. This dressing can be removed 5 days after surgery and you do not need to reapply a new dressing. Once you remove the dressing, you will notice that you have the surgical glue (dermabond) on the incision and this will peel off on its own. You can get this dressing wet in the shower the days after surgery prior to removal on the 5th day.  ?  ?Activity: ?1. Be up and out of the bed during the day.  Take a nap if needed.  You may walk up steps but be careful and use the hand rail.  Stair climbing will tire you more than you think, you may need to stop part way and rest.  ?  ?2. No lifting or straining for 6 weeks over 10 pounds. No pushing, pulling, straining for 6 weeks. ?  ?3. No driving for around 1 week(s).  Do not drive if you are taking narcotic pain medicine and make sure that your reaction time has returned.  ?  ?4. You can shower as soon as the next day after surgery. Shower daily.  Use your regular soap and water (not directly on the incision) and pat your incision(s) dry afterwards; don't rub.  No tub baths or submerging your body in water until cleared by your surgeon. If you have the soap that was given to you by pre-surgical testing that was used before surgery, you do not need to use it afterwards because this can irritate your incisions.  ?  ?5. No sexual activity and nothing in the vagina for 8 weeks. ?  ?6. You may experience a small amount of clear drainage from your incisions, which  is normal.  If the drainage persists, increases, or changes color please call the office. ?  ?7. Do not use creams, lotions, or ointments such as neosporin on your incisions after surgery until advised by your surgeon because they can cause removal of the dermabond glue on your incisions.   ?  ?8. You may experience vaginal spotting after surgery or around the 6-8 week mark from surgery when the stitches at the top of the vagina begin to dissolve.  The spotting is normal but if you experience heavy bleeding, call our office. ?  ?9. Take Tylenol first for pain and only use narcotic pain medication for severe pain not relieved by the Tylenol. You can continue with the tramadol you take at home. Monitor your Tylenol intake to a max of 4,000 mg in a 24 hour period.  ?  ?Diet: ?1. Low sodium Heart Healthy Diet is recommended but you are cleared to resume your normal (before surgery) diet after your procedure. ?  ?2. It is safe to use a laxative, such as Miralax or Colace, if you have difficulty moving your bowels. You have been prescribed Sennakot-S to take at bedtime every evening after surgery to keep bowel movements regular and to prevent constipation.   ?  ?Wound  Care: ?1. Keep clean and dry.  Shower daily. ?  ?Reasons to call the Doctor: ?Fever - Oral temperature greater than 100.4 degrees Fahrenheit ?Foul-smelling vaginal discharge ?Difficulty urinating ?Nausea and vomiting ?Increased pain at the site of the incision that is unrelieved with pain medicine. ?Difficulty breathing with or without chest pain ?New calf pain especially if only on one side ?Sudden, continuing increased vaginal bleeding with or without clots. ?  ?Contacts: ?For questions or concerns you should contact: ?  ?Dr. Jeral Pinch at [phone] ?  ?Joylene John, NP at [phone] ?  ?After Hours: call [phone] and have the GYN Oncologist paged/contacted (after 5 pm or on the weekends). ?  ?Messages sent via mychart are for non-urgent  matters and are not responded to after hours so for urgent needs, please call the after hours number. ?  ?

## 2022-01-15 NOTE — TOC Benefit Eligibility Note (Signed)
Patient Advocate Encounter  Insurance verification completed.    The patient is currently admitted and upon discharge could be taking Eliquis 5 mg.  The current 30 day co-pay is, $45.00.   The patient is insured through [employer] Part D     Danitra Payano, CPhT Pharmacy Patient Advocate Specialist [facility] Patient Advocate Team Direct Number: [phone]  Fax: [phone]        

## 2022-01-16 ENCOUNTER — Other Ambulatory Visit (HOSPITAL_COMMUNITY): Payer: Self-pay

## 2022-01-16 LAB — HEPARIN LEVEL (UNFRACTIONATED): Heparin Unfractionated: 0.87 IU/mL — ABNORMAL HIGH (ref 0.30–0.70)

## 2022-01-16 LAB — CBC
HCT: 37.4 % (ref 36.0–46.0)
Hemoglobin: 11.8 g/dL — ABNORMAL LOW (ref 12.0–15.0)
MCH: 27.9 pg (ref 26.0–34.0)
MCHC: 31.6 g/dL (ref 30.0–36.0)
MCV: 88.4 fL (ref 80.0–100.0)
Platelets: 180 10*3/uL (ref 150–400)
RBC: 4.23 MIL/uL (ref 3.87–5.11)
RDW: 14.4 % (ref 11.5–15.5)
WBC: 12.1 10*3/uL — ABNORMAL HIGH (ref 4.0–10.5)
nRBC: 0.3 % — ABNORMAL HIGH (ref 0.0–0.2)

## 2022-01-16 LAB — BASIC METABOLIC PANEL
Anion gap: 5 (ref 5–15)
BUN: 44 mg/dL — ABNORMAL HIGH (ref 8–23)
CO2: 23 mmol/L (ref 22–32)
Calcium: 9.3 mg/dL (ref 8.9–10.3)
Chloride: 109 mmol/L (ref 98–111)
Creatinine, Ser: 2.25 mg/dL — ABNORMAL HIGH (ref 0.44–1.00)
GFR, Estimated: 24 mL/min — ABNORMAL LOW (ref >60)
Glucose, Bld: 152 mg/dL — ABNORMAL HIGH (ref 70–99)
Potassium: 4.2 mmol/L (ref 3.5–5.1)
Sodium: 137 mmol/L (ref 135–145)

## 2022-01-16 LAB — GLUCOSE, CAPILLARY
Glucose-Capillary: 155 mg/dL — ABNORMAL HIGH (ref 70–99)
Glucose-Capillary: 111 mg/dL — ABNORMAL HIGH (ref 70–99)
Glucose-Capillary: 111 mg/dL — ABNORMAL HIGH (ref 70–99)

## 2022-01-16 LAB — SURGICAL PATHOLOGY

## 2022-01-16 MED ORDER — APIXABAN 2.5 MG PO TABS
2.5000 mg | ORAL_TABLET | Freq: Two times a day (BID) | ORAL | Status: DC
  Administered 2022-01-16: 2.5 mg via ORAL
  Filled 2022-01-16: qty 1

## 2022-01-16 MED ORDER — HEPARIN (PORCINE) 25000 UT/250ML-% IV SOLN
900.0000 [IU]/h | INTRAVENOUS | Status: DC
  Administered 2022-01-16: 900 [IU]/h via INTRAVENOUS
  Filled 2022-01-16: qty 250

## 2022-01-16 MED ORDER — APIXABAN 2.5 MG PO TABS
2.5000 mg | ORAL_TABLET | Freq: Two times a day (BID) | ORAL | 3 refills | Status: DC
  Filled 2022-01-16 – 2022-02-10 (×2): qty 60, 30d supply, fill #0
  Filled 2022-03-09: qty 60, 30d supply, fill #1
  Filled 2022-04-10: qty 60, 30d supply, fill #2

## 2022-01-16 NOTE — Discharge Summary (Signed)
?Physician Discharge Summary  ?Patient ID: ?Kristy [name] ?MRN: [id number] ?DOB/AGE: 03/17/1956 66 y.o. ? ?Admit date: 01/13/2022 ?Discharge date: 01/16/2022 ? ?Admission Diagnoses: Endometrial cancer ([place]) ? ?Discharge Diagnoses:  ?Principal Problem: ?  Endometrial cancer ([place]) ? ?Discharged Condition:  The patient is in good condition and stable for discharge.  ? ?Hospital Course: On 01/13/2022, the patient underwent the following: Procedure(s): XI ROBOTIC ASSISTED TOTAL HYSTERECTOMY WITH BILATERAL SALPINGO OOPHORECTOMY, mini laparotomy, OMENTECTOMY ?SENTINEL NODE BIOPSY WITH LYMPH NODE DISSECTION. On POD 1, hyperkalemia was noted with potassium at 5.9 with this resolving after IVF discontinuation, lactulose/kayexalate administration. Creatinine monitored during stay and remained close to baseline. On POD 2, due to new onset tachycardia with stable labs, a lower extremity doppler was ordered revealing an age indeterminate deep vein thrombosis involving the right common femoral vein, right femoral vein, and right popliteal vein. Chest xray was obtained as well. She was started on a heparin drip managed per pharmacy and continued this for 24 hours. She was transitioned to Eliquis 2.5 mg BID (dosing per Dr. Alvy Bimler with Hem/Med Onc) on 01/16/22. She was discharged to home on postoperative day 3 tolerating a regular diet, labs at baseline, voiding, no pain reported, mobility at baseline, passing flatus. [facility] delivered Eliquis prescription for home use to the room.   ? ?Consults: Pharmacy ? ?Significant Diagnostic Studies: Labs, Chest xray, Doppler ? ?Treatments: IV hydration, anticoagulation: heparin drip x 24 hours starting 01/15/22 for DVT diagnosed in right lower extremity, and surgery: see above ? ?Discharge Exam: ?Blood pressure (!) 135/91, pulse 88, temperature 97.8 ?F (36.6 ?C), temperature source Oral, resp. rate 14, height $RemoveBe'5\' 2"'oeIRzJYuG$  (1.575 m), weight 218 lb 0.6 oz (98.9 kg), last menstrual period  01/05/2009, SpO2 98 %. ?General appearance: alert, cooperative, and no distress ?Resp: mildly diminished in the bases but clear ?Cardio: regular rate and rhythm, S1, S2 normal, no murmur, click, rub or gallop ?GI: soft, non-tender; bowel sounds normal; no masses,  no organomegaly ?Extremities: extremities normal, atraumatic, no cyanosis or edema ?Incision/Wound: Lap sites to the abdomen with dermabond intact, mini laparotomy incision with op site dressing in place and intact with no drainage noted underneath. ? ?Disposition: Discharge disposition: 01-Home or Self Care ? ? ? ? ? ? ?Discharge Instructions   ? ? Call MD for:  difficulty breathing, headache or visual disturbances   Complete by: As directed ?  ? Call MD for:  extreme fatigue   Complete by: As directed ?  ? Call MD for:  hives   Complete by: As directed ?  ? Call MD for:  persistant dizziness or light-headedness   Complete by: As directed ?  ? Call MD for:  persistant nausea and vomiting   Complete by: As directed ?  ? Call MD for:  redness, tenderness, or signs of infection (pain, swelling, redness, odor or green/yellow discharge around incision site)   Complete by: As directed ?  ? Call MD for:  severe uncontrolled pain   Complete by: As directed ?  ? Call MD for:  temperature >100.4   Complete by: As directed ?  ? Diet - low sodium heart healthy   Complete by: As directed ?  ? Fluid restriction  ? Discharge wound care:   Complete by: As directed ?  ? You will have a white honeycomb dressing over your larger incision. This dressing can be removed 5 days after surgery and you do not need to reapply a new dressing. Once you remove the  dressing, you will notice that you have the surgical glue (dermabond) on the incision and this will peel off on its own. You can get this dressing wet in the shower the days after surgery prior to removal on the 5th day.  ? Driving Restrictions   Complete by: As directed ?  ? No driving for around 1 week from surgery if you  were cleared to drive before surgery.  Do not take narcotics and drive. You need to make sure your reaction time has returned.  ? Increase activity slowly   Complete by: As directed ?  ? Lifting restrictions   Complete by: As directed ?  ? No lifting greater than 10 lbs, pushing, pulling, straining for 6 weeks from surgery.  ? Sexual Activity Restrictions   Complete by: As directed ?  ? No sexual activity, nothing in the vagina, for 8 weeks.  ? ?  ? ?Allergies as of 01/16/2022   ?No Known Allergies ?  ? ?  ?Medication List  ?  ? ?STOP taking these medications   ? ?Turmeric 500 MG Caps ?  ? ?  ? ?TAKE these medications   ? ?Accu-Chek FastClix Lancets Misc ?Check blood sugar two times a day ?What changed:  ?how much to take ?how to take this ?when to take this ?additional instructions ?  ?Accu-Chek Guide test strip ?Generic drug: glucose blood ?Check blood sugar two times a day ?What changed:  ?how much to take ?how to take this ?when to take this ?additional instructions ?  ?Accu-Chek Guide w/Device Kit ?1 each by Does not apply route 2 (two) times daily. ?  ?aspirin 81 MG EC tablet ?Take 81 mg by mouth daily. ?  ?atorvastatin 40 MG tablet ?Commonly known as: LIPITOR ?TAKE 1 TABLET (40 MG TOTAL) BY MOUTH DAILY. ?  ?carvedilol 12.5 MG tablet ?Commonly known as: COREG ?Take 1 tablet (12.5 mg total) by mouth 2 (two) times daily. ?  ?Eliquis 2.5 MG Tabs tablet ?Generic drug: apixaban ?Take 1 tablet by mouth 2 times daily. ?  ?glucose 4 GM chewable tablet ?Chew 1 tablet by mouth as needed for low blood sugar. ?  ?Insulin Pen Needle 31G X 5 MM Misc ?Commonly known as: B-D UF III MINI PEN NEEDLES ?1 Device by Does not apply route 3 (three) times daily before meals. Check daily 3X before meals ?  ?Unifine Pentips 31G X 6 MM Misc ?Generic drug: Insulin Pen Needle ?See admin instructions. ?  ?multivitamin tablet ?Take 1 tablet by mouth daily. ?  ?Probiotic Daily Caps ?Take 1 tablet by mouth daily at 12 noon. ?  ?SAMBUCUS  ELDERBERRY PO ?Take 1 tablet by mouth daily. ?  ?senna-docusate 8.6-50 MG tablet ?Commonly known as: Senokot-S ?Take 2 tablets by mouth at bedtime. For AFTER surgery, do not take if having diarrhea ?  ?spironolactone 50 MG tablet ?Commonly known as: ALDACTONE ?Take 1 tablet by mouth once daily ?  ?traMADol 50 MG tablet ?Commonly known as: ULTRAM ?Take 2 tablets (100 mg total) by mouth every 6 (six) hours as needed for severe pain (must last 30 days) ?  ?vitamin C 1000 MG tablet ?Take 1,000 mg by mouth daily. ?  ?Xultophy 100-3.6 UNIT-MG/ML Sopn ?Generic drug: Insulin Degludec-Liraglutide ?Inject 40 Units into the skin daily. ?  ? ?  ? ?  ?  ? ? ?  ?Discharge Care Instructions  ?(From admission, onward)  ?  ? ? ?  ? ?  Start     Ordered  ?  01/16/22 0000  Discharge wound care:       ?Comments: You will have a white honeycomb dressing over your larger incision. This dressing can be removed 5 days after surgery and you do not need to reapply a new dressing. Once you remove the dressing, you will notice that you have the surgical glue (dermabond) on the incision and this will peel off on its own. You can get this dressing wet in the shower the days after surgery prior to removal on the 5th day.  ? 01/16/22 1426  ? ?  ?  ? ?  ? ? Follow-up Information   ? ? Lafonda Mosses, MD Follow up on 01/21/2022.   ?Specialty: Gynecologic Oncology ?Why: at 4:20pm will be a PHONE call to discuss pathology results and to check in with Dr. Berline Lopes. IN PERSON visit wil be on 02/02/22 at 2:15pm at the [facility]. ?Contact information: ?[address] ?[place] [address] ?[phone] ? ? ?  ?  ? ?  ?  ? ?  ? ? ?Greater than thirty minutes were spend for face to face discharge instructions and discharge orders/summary in EPIC.  ? ?Signed: ?Tamarick Kovalcik D Makyra Corprew ?01/16/2022, 3:25 PM ? ? ? ?  ?

## 2022-01-16 NOTE — Progress Notes (Addendum)
3 Days Post-Op Procedure(s) (LRB): ?XI ROBOTIC ASSISTED TOTAL HYSTERECTOMY WITH BILATERAL SALPINGO OOPHORECTOMY mini laparotomy  OMENTECTOMY (Bilateral) ?SENTINEL NODE BIOPSY (Bilateral) ?LYMPH NODE DISSECTION (N/A) ? ?Subjective: ?Patient reports doing well this am. Mild nausea has resolved. Continuing to tolerate a solid diet with no emesis. No abdominal pain reported. She is continuing to void without difficulty. Passing flatus. No BM reported. No vaginal bleeding reported. No concerns voiced. Denies chest pain, dyspnea, lightheadedness, dizziness.  ? ?Objective: ?Vital signs in last 24 hours: ?Temp:  [97.5 ?F (36.4 ?C)-98.3 ?F (36.8 ?C)] 97.5 ?F (36.4 ?C) (03/24 0547) ?Pulse Rate:  [90-102] 92 (03/24 0547) ?Resp:  [15-18] 18 (03/24 0547) ?BP: (146-171)/(92-102) 171/92 (03/24 0547) ?SpO2:  [92 %-95 %] 92 % (03/24 0547) ?Last BM Date : 01/11/22 ? ?Intake/Output from previous day: ?03/23 0701 - 03/24 0700 ?In: 763.4 [P.O.:660; I.V.:103.4] ?Out: 950 [Urine:950] ? ?Physical Examination (performed by Dr. Berline Lopes): ?General: alert, cooperative, and no distress ?Resp: diminished in the bases. No crackles or rales noted. ?Cardio: regular rate and rhythm, S1, S2 normal, no murmur, click, rub or gallop and 82 bpm. Regular rhythm. ?GI: soft, non-tender; bowel sounds normal; no masses,  no organomegaly and incision: lap sites to the abdomen with dermabond intact, mini laparotomy midline abdominal incision with op site dressing in place and intact with no drainage underneath. ?Extremities: extremities normal, atraumatic, no cyanosis or edema ?Abdominal binder in place. Pt taking break from SCDs. ?O2 sat 96% on RA ? ?Labs: ?WBC/Hgb/Hct/Plts:  12.1/11.8/37.4/180 (03/24 0405) BUN/Cr/glu/ALT/AST/amyl/lip:  44/2.25/--/--/--/--/-- (03/24 0405) ? ?Assessment: ?66 y.o. s/p Procedure(s): ?XI ROBOTIC ASSISTED TOTAL HYSTERECTOMY WITH BILATERAL SALPINGO OOPHORECTOMY mini laparotomy  OMENTECTOMY, SENTINEL NODE BIOPSY with LYMPH NODE  DISSECTION: stable ?Pain:  Pain is well-controlled on PRN medications. ? ?Heme: Hgb 11.8 and Hct 37.4 this am. Remains stable. Currently on heparin drip for newly diagnosed DVT on doppler from 01/15/22. ? ?CV: BP slightly elevated- HCTZ reordered 01/14/22. Tachycardia improved. DVT on doppler from 01/15/22- on heparin drip per pharmacy. Continue monitoring with ordered vital sign assessments. ? ?GI:  Tolerating po: Yes. Antiemetics ordered as needed. ? ?GU: Creatinine 2.25 this am from 2.45 01/14/22 am. Pt at baseline.  ? ?FEN: Hyperkalemia improved- K+ 4.2 this am. ? ?Endo: Diabetes mellitus Type II, under good control.  CBG: CBG (last 3)  ?Recent Labs  ?  01/15/22 ?2349 01/16/22 ?0354 01/16/22 ?0802  ?GLUCAP 224* 155* 111*  ?  ?Prophylaxis: SCDs in the room. Heparin drip per pharmacy due to +DVT on doppler from 01/15/22 ordered for new tachycardia. Given kidney disease, discussion with Dr. Berline Lopes and radiologist, and findings of chest xray with atelectasis, it was felt a VQ scan would not be overly beneficial in determining the presence of a PE per the radiologist. ? ?Plan: ?-Plan for heparin drip x 24 hours from initiation then transition to Eliquis. Per Dr. Alvy Bimler with Hematology/Oncology, she recommends Eliquis 2.5 mg BID given patient's kidney function, co-morbidities, high risk for bleeding, and with further review needed on final surgical pathology, opt for lower dosing. ?-Plan for possible discharge later today if patient meeting milestones ?-Eliquis to be sent to [facility] with assistance from the [facility] to cover cost ?-Continue plan of care per Dr. Berline Lopes  ? ? LOS: 1 day  ? ? ?Kristy [name] ?01/16/2022, 8:54 AM ? ? ? ?   ?

## 2022-01-16 NOTE — Progress Notes (Signed)
ANTICOAGULATION CONSULT NOTE - follow up ? ?Pharmacy Consult for heparin ?Indication: DVT ? ?No Known Allergies ? ?Patient Measurements: ?Height: '5\' 2"'$  (157.5 cm) ?Weight: 98.9 kg (218 lb 0.6 oz) ?IBW/kg (Calculated) : 50.1 ?Heparin Dosing Weight: 73 kg ? ?Vital Signs: ?Temp: 97.5 ?F (36.4 ?C) (03/24 0547) ?Temp Source: Oral (03/24 0547) ?BP: 171/92 (03/24 0547) ?Pulse Rate: 92 (03/24 0547) ? ?Labs: ?Recent Labs  ?  01/14/22 ?0502 01/14/22 ?1131 01/15/22 ?0415 01/15/22 ?2309 01/16/22 ?0405 01/16/22 ?0846  ?HGB 12.0  --  12.4  --  11.8*  --   ?HCT 37.6  --  39.6  --  37.4  --   ?PLT 202  --  212  --  180  --   ?HEPARINUNFRC  --   --   --  1.07*  --  0.87*  ?CREATININE 2.14* 2.35* 2.06*  --  2.25*  --   ? ? ? ?Estimated Creatinine Clearance: 27.4 mL/min (A) (by C-G formula based on SCr of 2.25 mg/dL (H)). ? ? ?Medical History: ?Past Medical History:  ?Diagnosis Date  ? Arthritis   ? Chronic kidney disease   ? Colon polyps   ? Colon polyps   ? Diabetes mellitus   ? Diverticulosis   ? Endometrial cancer ([place])   ? Family history of adverse reaction to anesthesia   ? Heart attack during anesthesia  ? Hyperlipidemia   ? Hypertension   ? Pneumonia due to COVID-19 virus 11/24/2020  ? Renal artery stenosis ([place]) 2007  ?  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 Albertine Patricia  ? Sleep apnea 2007  ?  status post polysomnogram 2007 , suggested use of CPAP  ? Ventricular hypertrophy 2006  ?  a 2-D echo in 2006, ejection fraction 55%, mild tricuspid regurgitation noted as well, 2-D echo November 13, 9733 showed diastolic dysfunction with LVEF normal of 65%  ? ? ?Medications: Not on anticoagulants PTA ?-Heparin 5000 units subQ q8h ordered inpatient post-op. Last dose given 3/23 @ 0506 ? ?Assessment: ?Patient is a 75 yoF who underwent total hysterectomy with  bilateral salpingo oophorectomy with sentinel node biopsy on 3/21. Pt was admitted to hospital post-op. PMH significant for CKD, baseline SCr appears to be ~2-2.5.  ? ?Venous doppler on 3/23 revealed findings consistent with age-indeterminate DVT involving the right common femoral vein, right femoral vein, and right popliteal vein.  ? ?Pharmacy was consulted to assist with anticoagulation recommendations and IV heparin drip recommended for now since patient is post-op and pending further PE workup with eventual transition to treatment dose apixaban given history of CKD.  ? ?Pharmacy consulted to dose heparin drip for DVT. ? ?Today, 01/16/22 ?08:46 HL 0.87 supra-therapeutic on 1050 units/hr ?Per RN no bleeding and no line issues ? ?Goal of Therapy:  ?Heparin level 0.3-0.7 units/ml ?Monitor platelets by anticoagulation protocol: Yes ?  ?Plan:  ?Decrease heparin infusion rate to 900 units/hr ?Check HL level in 8 hours and daily while on heparin ?Monitor CBC daily and signs/symptoms of bleeding ?Follow for eventual transition to apixaban. Anticipated co-pay for 30 day supply of Eliquis is $45. Pharmacy to provide manufacturer coupon and medication counseling prior to discharge.  ? ?Royetta Asal, PharmD, BCPS ?Clinical Pharmacist ?[facility] ?Please utilize Amion for appropriate phone number to reach the unit pharmacist ([place]) ?01/16/2022 9:27 AM ? ? ?

## 2022-01-16 NOTE — Progress Notes (Signed)
ANTICOAGULATION CONSULT NOTE - follow up ? ?Pharmacy Consult for heparin ?Indication: DVT ? ?No Known Allergies ? ?Patient Measurements: ?Height: '5\' 2"'$  (157.5 cm) ?Weight: 98.9 kg (218 lb 0.6 oz) ?IBW/kg (Calculated) : 50.1 ?Heparin Dosing Weight: 73 kg ? ?Vital Signs: ?Temp: 98.3 ?F (36.8 ?C) (03/23 2045) ?Temp Source: Oral (03/23 2045) ?BP: 146/99 (03/23 2045) ?Pulse Rate: 90 (03/23 2045) ? ?Labs: ?Recent Labs  ?  01/14/22 ?0502 01/14/22 ?1131 01/15/22 ?0415 01/15/22 ?2309  ?HGB 12.0  --  12.4  --   ?HCT 37.6  --  39.6  --   ?PLT 202  --  212  --   ?HEPARINUNFRC  --   --   --  1.07*  ?CREATININE 2.14* 2.35* 2.06*  --   ? ? ? ?Estimated Creatinine Clearance: 29.9 mL/min (A) (by C-G formula based on SCr of 2.06 mg/dL (H)). ? ? ?Medical History: ?Past Medical History:  ?Diagnosis Date  ? Arthritis   ? Chronic kidney disease   ? Colon polyps   ? Colon polyps   ? Diabetes mellitus   ? Diverticulosis   ? Endometrial cancer ([place])   ? Family history of adverse reaction to anesthesia   ? Heart attack during anesthesia  ? Hyperlipidemia   ? Hypertension   ? Pneumonia due to COVID-19 virus 11/24/2020  ? Renal artery stenosis ([place]) 2007  ?  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 Albertine Patricia  ? Sleep apnea 2007  ?  status post polysomnogram 2007 , suggested use of CPAP  ? Ventricular hypertrophy 2006  ?  a 2-D echo in 2006, ejection fraction 55%, mild tricuspid regurgitation noted as well, 2-D echo November 14, 9447 showed diastolic dysfunction with LVEF normal of 65%  ? ? ?Medications: Not on anticoagulants PTA ?-Heparin 5000 units subQ q8h ordered inpatient post-op. Last dose given 3/23 @ 0506 ? ?Assessment: ?Patient is a 34 yoF who underwent total hysterectomy with bilateral salpingo oophorectomy with sentinel node biopsy on 3/21. Pt was admitted to  hospital post-op. PMH significant for CKD, baseline SCr appears to be ~2-2.5.  ? ?Venous doppler on 3/23 revealed findings consistent with age-indeterminate DVT involving the right common femoral vein, right femoral vein, and right popliteal vein.  ? ?Pharmacy was consulted to assist with anticoagulation recommendations - I recommended IV heparin drip for now since patient is post-op and pending further PE workup with eventual transition to treatment dose apixaban given history of CKD.  ? ?Pharmacy consulted to dose heparin drip for DVT. ? ?HL 1.07 supra-therapeutic on 1250 units/hr ?Per RN no bleeding ? ?Goal of Therapy:  ?Heparin level 0.3-0.7 units/ml ?Monitor platelets by anticoagulation protocol: Yes ?  ?Plan:  ?Hold heparin drip  x 1 hour then decrease rate to 1050 units/hr ?Check HL level in 8 hours and daily while on heparin ?CBC daily ?Follow for eventual transition to apixaban. Anticipated co-pay for 30 day supply of Eliquis is $45. Pharmacy to provide manufacturer coupon and medication counseling prior to discharge.  ? ?Dolly Rias RPh ?01/16/2022, 12:02 AM ? ?

## 2022-01-16 NOTE — Progress Notes (Signed)
Discharge instructions discussed with patient, verbalized agreement ?

## 2022-01-19 ENCOUNTER — Telehealth: Payer: Self-pay

## 2022-01-19 ENCOUNTER — Other Ambulatory Visit: Payer: Self-pay | Admitting: Student

## 2022-01-19 ENCOUNTER — Other Ambulatory Visit (HOSPITAL_COMMUNITY): Payer: Self-pay

## 2022-01-19 ENCOUNTER — Other Ambulatory Visit: Payer: Self-pay | Admitting: Oncology

## 2022-01-19 ENCOUNTER — Other Ambulatory Visit: Payer: Self-pay | Admitting: Internal Medicine

## 2022-01-19 DIAGNOSIS — C541 Malignant neoplasm of endometrium: Secondary | ICD-10-CM

## 2022-01-19 DIAGNOSIS — E785 Hyperlipidemia, unspecified: Secondary | ICD-10-CM

## 2022-01-19 NOTE — Progress Notes (Signed)
Gynecologic Oncology Multi-Disciplinary Disposition Conference Note ? ?Date of the Conference: 01/19/2022 ? ?Patient Name: Kristy [name]  ?Referring Provider: Dr. Talbert Nan ?Primary GYN Oncologist: Dr. Berline Lopes ? 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 Disposition is to close surveillance.  Also referral to genetic counseling.  ? ?This Multidisciplinary conference took place involving physicians from [facility], Medical Oncology, Radiation Oncology, Pathology, Radiology along with the Gynecologic Oncology Nurse Practitioner and Gynecologic Oncology Nurse Navigator.  Comprehensive assessment of the patient's malignancy, staging, need for surgery, chemotherapy, radiation therapy, and need for further testing were reviewed. Supportive measures, both inpatient and following discharge were also discussed. The recommended plan of care is documented. Greater than 35 minutes were spent correlating and coordinating this patient's care.  ?

## 2022-01-19 NOTE — Telephone Encounter (Signed)
Spoke with Ms. Recktenwald this morning. She states she is eating, drinking and urinating well. She had a BM yesterday and is taking a stool softener. Encouraged her to drink plenty of water. She denies fever or chills. Incisions are dry and intact. She has not removed her honey comb dressing yet. Advised patient to remove the dressing now as it has been more than 5 days since her surgery. She is going to wait until her daughter comes over later today.  She rates her pain 4/10. Her pain is controlled with tramadol. Encouraged patient to use tylenol first for pain and for pain unrelieved by tylenol to take tramadol. Patient verbalized understanding. ? ?Patient reports she has been taking her eliquis and aspirin as prescribed.    ? ?Patient inquiring if she needs to continue to wear "the abdominal wrap" as it's uncomfortable. Per Joylene John, NP patient can use the abdominal binder if she feels it is providing support but if its uncomfortable she does not have to wear it.  ? ?Instructed to call office with any fever, chills, purulent drainage, uncontrolled pain or any other questions or concerns. Patient verbalizes understanding.  ? ?Pt aware of post op appointments as well as the office number [phone] and after hours number [phone] to call if she has any questions or concerns  ?

## 2022-01-20 ENCOUNTER — Telehealth: Payer: Self-pay | Admitting: *Deleted

## 2022-01-20 ENCOUNTER — Other Ambulatory Visit (HOSPITAL_COMMUNITY): Payer: Self-pay

## 2022-01-20 ENCOUNTER — Telehealth: Payer: Self-pay | Admitting: Gynecologic Oncology

## 2022-01-20 NOTE — Telephone Encounter (Signed)
.  Called patient to schedule appointment per 3/27 inbasket, patient is aware of date and time.   ?

## 2022-01-20 NOTE — Telephone Encounter (Signed)
Faxed results of the doppler and medication sheet to pt's pcp Dr.Benjamin Coe. Pt was started on eliquis in the hospital due to a DVT and would need her pcp to take over management of this.  ?

## 2022-01-21 ENCOUNTER — Other Ambulatory Visit: Payer: Self-pay | Admitting: Student

## 2022-01-21 ENCOUNTER — Other Ambulatory Visit (HOSPITAL_COMMUNITY): Payer: Self-pay

## 2022-01-21 ENCOUNTER — Other Ambulatory Visit: Payer: Self-pay | Admitting: Internal Medicine

## 2022-01-21 ENCOUNTER — Inpatient Hospital Stay (HOSPITAL_BASED_OUTPATIENT_CLINIC_OR_DEPARTMENT_OTHER): Payer: Medicare HMO | Admitting: Gynecologic Oncology

## 2022-01-21 DIAGNOSIS — Z7189 Other specified counseling: Secondary | ICD-10-CM

## 2022-01-21 DIAGNOSIS — Z90722 Acquired absence of ovaries, bilateral: Secondary | ICD-10-CM

## 2022-01-21 DIAGNOSIS — E785 Hyperlipidemia, unspecified: Secondary | ICD-10-CM

## 2022-01-21 DIAGNOSIS — C541 Malignant neoplasm of endometrium: Secondary | ICD-10-CM

## 2022-01-21 DIAGNOSIS — Z90710 Acquired absence of both cervix and uterus: Secondary | ICD-10-CM

## 2022-01-21 DIAGNOSIS — N184 Chronic kidney disease, stage 4 (severe): Secondary | ICD-10-CM

## 2022-01-21 DIAGNOSIS — I82401 Acute embolism and thrombosis of unspecified deep veins of right lower extremity: Secondary | ICD-10-CM

## 2022-01-21 MED ORDER — ATORVASTATIN CALCIUM 40 MG PO TABS
40.0000 mg | ORAL_TABLET | Freq: Every day | ORAL | 3 refills | Status: DC
  Filled 2022-01-21 – 2022-01-27 (×2): qty 90, 90d supply, fill #0
  Filled 2022-05-18 – 2022-06-01 (×2): qty 90, 90d supply, fill #1
  Filled 2022-10-05: qty 90, 90d supply, fill #2
  Filled 2023-01-01: qty 90, 90d supply, fill #3

## 2022-01-21 NOTE — Progress Notes (Signed)
Gynecologic Oncology Telehealth Consult Note: Gyn-Onc ? ?I connected with Kristy [name] on 01/22/22 at  4:20 PM EDT by telephone and verified that I am speaking with the correct person using two identifiers. ? ?I discussed the limitations, risks, security and privacy concerns of performing an evaluation and management service by telemedicine and the availability of in-person appointments. I also discussed with the patient that there may be a patient responsible charge related to this service. The patient expressed understanding and agreed to proceed. ? ?Other persons participating in the visit and their role in the encounter: None. ? ?Patient's location: Home ?Provider's location: [facility] ? ?Reason for Visit: Follow-up after surgery, treatment discussion ? ?Treatment History: ?She denies any history of abnormal Pap smears until recently.  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 The cervix was cauterized beyond the edge of the leep biopsy. When she was seen for her follow-up pap test on 1/18. Patient underwent colposcopy. Cervix was noted to be very stenotic.  No acetowhite changes were noted after application of acetic acid.  After application of Lugol's, as there was decreased uptake at 6:00, biopsy was performed. Because of her stenotic cervix, a scalpel was used to open the os with drainage of a large amount of brown mucus. ECC was then performed.  Pelvic ultrasound exam was also performed given these findings and showed uterus measures 9 x 3.8 x 5.6 cm with an endometrial lining of 2.4 cm.  Masses seen within the endometrium as well as a small amount of fluid.  Cervix is dilated and filled with fluid.   ?  ?Low-grade dysplasia was noted from the 6:00 cervical biopsy.  Endocervix biopsy showed minute detached fragments of atypical glandular epithelium, favored to be endometrial within a necroinflammatory diathesis.  Minute detached  fragment of dysplastic metaplastic squamous epithelium, at least late grade also noted.  Endometrial biopsy showed scant detached atypical glandular epithelium, favored to be endometrial within a background of focal atypical hyperplasia.  Changes focally suggestive of an endometrial polyp.  Comment is that both the endocervical and endometrial sampling showed detached fragments of highly atypical glandular epithelium.  Additional sampling is recommended. ? ?On 12/23/2021, patient underwent hysteroscopy, D&C under hysteroscopic guidance, ECC, cold knife cone, and Mirena IUD placement.  Findings at the time of surgery were a normal and somewhat atrophic cervix.  Multiple fibroids and polypoid appearing structures within the endometrium, some of which appeared nodular with calcifications.  Final pathology revealed high-grade endometrial carcinoma, FIGO grade 3, with clear cell and high-grade serous component.  Focal sarcomatous component cannot be ruled out.  ECC showed blood and fibrin.  Cervical conization showed low-grade dysplasia. ? ?Given high-grade pathology, recommendation was made to defer patient's scheduled orthopedic surgery.  On 3/21, she underwent robotic assisted TLH with BSO, bilateral sentinel lymph node biopsy, omentectomy, lysis of adhesion, mini lap, and periurethral laceration repair.  Findings at the time of surgery included an enlarged uterus.  Umbilical hernia noted.  No obvious omental or intra-abdominal/pelvic disease.  Mild pelvic adhesive disease.  3 cm paratubal cyst on the right with torsed right ovary.  No adenopathy. ? ?Patient's postoperative course was complicated by development of mild tachycardia on postoperative day #2.  Work-up for this found a DVT of the right common femoral vein, right femoral vein, and right popliteal vein of indeterminate age.  Given patient's inability to get IV contrast in the setting of her  kidney disease as well as postoperative atelectasis, VQ scan was  deferred.  Patient was started on therapeutic anticoagulation with a heparin drip for 24 hours.  She was then transition to Eliquis at a decreased dose per hematologist. ? ?Interval History: ?Patient doing well, denies any significant abdominal pain. ?She denies any vaginal bleeding or discharge. ?She reports bowel function back to baseline, denies any urinary symptoms. ?Tolerating diet, denies nausea or emesis. ?Increasing activity as she is able to.  Taking oral anticoagulant.  Denies any significant lower extremity edema. ? ?Past Medical/Surgical History: ?Past Medical History:  ?Diagnosis Date  ? Arthritis   ? Chronic kidney disease   ? Colon polyps   ? Colon polyps   ? Diabetes mellitus   ? Diverticulosis   ? Endometrial cancer ([place])   ? Family history of adverse reaction to anesthesia   ? Heart attack during anesthesia  ? Hyperlipidemia   ? Hypertension   ? Pneumonia due to COVID-19 virus 11/24/2020  ? Renal artery stenosis ([place]) 2007  ?  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 Albertine Patricia  ? Sleep apnea 2007  ?  status post polysomnogram 2007 , suggested use of CPAP  ? Ventricular hypertrophy 2006  ?  a 2-D echo in 2006, ejection fraction 55%, mild tricuspid regurgitation noted as well, 2-D echo November 13, 4330 showed diastolic dysfunction with LVEF normal of 65%  ? ? ?Past Surgical History:  ?Procedure Laterality Date  ? BREAST BIOPSY Right 2016  ? CERVICAL CONIZATION W/BX N/A 12/23/2021  ? Procedure: CONIZATION CERVIX WITH BIOPSY;  Surgeon: Lafonda Mosses, MD;  Location: [facility];  Service: Gynecology;  Laterality: N/A;  ? COLONOSCOPY  02/03/2010  ? DILATATION & CURETTAGE/HYSTEROSCOPY WITH MYOSURE N/A 12/23/2021  ? Procedure: [place];  Surgeon: Lafonda Mosses, MD;  Location: [facility];   Service: Gynecology;  Laterality: N/A;  ? DILATION AND CURETTAGE OF UTERUS N/A 12/23/2021  ? Procedure: ENDOCERVICAL CURETTAGE;  Surgeon: Lafonda Mosses, MD;  Location: [facility];  Service: Gynecology;  Laterality: N/A;  ? INTRAUTERINE DEVICE (IUD) INSERTION N/A 12/23/2021  ? Procedure: INTRAUTERINE DEVICE (IUD) INSERTION MIRENA;  Surgeon: Lafonda Mosses, MD;  Location: [facility];  Service: Gynecology;  Laterality: N/A;  ? LYMPH NODE DISSECTION N/A 01/13/2022  ? Procedure: LYMPH NODE DISSECTION;  Surgeon: Lafonda Mosses, MD;  Location: [facility];  Service: Gynecology;  Laterality: N/A;  ? RENAL ARTERY STENT  2007  ?  patient is status post renal artery stent placement May 2007 and that was done secondary to renal artery stenosis, fibromuscular dysplasia  ? REPLACEMENT TOTAL KNEE  02/2011-Dr. Ninfa Linden  ? S/P Right Total Knee Replacement May/2012  ? ROBOTIC ASSISTED TOTAL HYSTERECTOMY WITH BILATERAL SALPINGO OOPHERECTOMY Bilateral 01/13/2022  ? Procedure: XI ROBOTIC ASSISTED TOTAL HYSTERECTOMY WITH BILATERAL SALPINGO OOPHORECTOMY mini laparotomy  OMENTECTOMY;  Surgeon: Lafonda Mosses, MD;  Location: [facility];  Service: Gynecology;  Laterality: Bilateral;  ? SENTINEL NODE BIOPSY Bilateral 01/13/2022  ? Procedure: SENTINEL NODE BIOPSY;  Surgeon: Lafonda Mosses, MD;  Location: [facility];  Service: Gynecology;  Laterality: Bilateral;  ? TUBAL LIGATION    ? ? ?Family History  ?Problem Relation Age of Onset  ? Diabetes Mother   ? Cancer Mother   ? Ovarian cancer Mother   ? Diabetes Father   ?  Colon polyps Sister   ? Hypertension Sister   ? Breast cancer Sister   ? Colon polyps Sister   ? Hypertension Sister   ? Hypertension Daughter   ? Stroke Daughter   ? Hypertension Daughter   ? Hypertension Daughter   ? Hypertension Daughter   ? Bipolar disorder Son   ? Alcohol abuse Son   ? Drug abuse Son   ? Esophageal cancer Maternal Aunt   ? Breast cancer Cousin   ? Cancer Other   ?     breast cancer died in her 63s  ? Colon  cancer Neg Hx   ? Stomach cancer Neg Hx   ? Rectal cancer Neg Hx   ? ? ?Social History  ? ?Socioeconomic History  ? Marital status: Married  ?  Spouse name: Not on file  ? Number of children: Not on file  ? Years o

## 2022-01-22 ENCOUNTER — Telehealth: Payer: Self-pay | Admitting: *Deleted

## 2022-01-22 ENCOUNTER — Encounter: Payer: Self-pay | Admitting: Gynecologic Oncology

## 2022-01-22 NOTE — Telephone Encounter (Signed)
Spoke with Kristy [name] this morning and informed her that we set up an appointment with Dr.Coe to manage her eliquis on 01-27-22 @ 9:15am. Kristy [name] verbalized understanding. Kristy [name] did not have any additional questions.  ?Also spoke with [place] to inform her that Dr.Tucker would be happy if they felt like they needed to increase the dose of eliquis from a post operative standpoint and they could manage it.  ?

## 2022-01-22 NOTE — Telephone Encounter (Incomplete Revision)
Call from [place] Dr. Berline Lopes patient was started on Eliquis at a low dose would lie doctor to review for changes if needed at visit. ?

## 2022-01-22 NOTE — Telephone Encounter (Addendum)
Call from [facility] Nurse with Dr. Berline Lopes patient was started on Eliquis low dose.  Would like for PCP to review at next visit for continuing or dose change. ?

## 2022-01-22 NOTE — Telephone Encounter (Incomplete Revision)
Call from [facility] Nurse with Dr. Berline Lopes patient was started on Eliquis low dose.  Would like for PCP to review at next visit for continuing or dose change. ?

## 2022-01-22 NOTE — Telephone Encounter (Incomplete Revision)
Call from [place] Dr. Berline Lopes patient was started on Eliquis would like for doctor to review dosage at next visit to see fif a change is needed. ?

## 2022-01-26 ENCOUNTER — Other Ambulatory Visit (HOSPITAL_COMMUNITY): Payer: Self-pay

## 2022-01-27 ENCOUNTER — Other Ambulatory Visit (HOSPITAL_COMMUNITY): Payer: Self-pay

## 2022-01-27 ENCOUNTER — Ambulatory Visit (INDEPENDENT_AMBULATORY_CARE_PROVIDER_SITE_OTHER): Payer: Medicare HMO | Admitting: Internal Medicine

## 2022-01-27 ENCOUNTER — Encounter: Payer: Medicare HMO | Admitting: Orthopaedic Surgery

## 2022-01-27 VITALS — BP 157/113 | HR 98 | Temp 98.3°F | Ht 62.0 in | Wt 213.9 lb

## 2022-01-27 DIAGNOSIS — E875 Hyperkalemia: Secondary | ICD-10-CM

## 2022-01-27 DIAGNOSIS — I82411 Acute embolism and thrombosis of right femoral vein: Secondary | ICD-10-CM | POA: Diagnosis not present

## 2022-01-27 DIAGNOSIS — I150 Renovascular hypertension: Secondary | ICD-10-CM

## 2022-01-27 DIAGNOSIS — N1832 Chronic kidney disease, stage 3b: Secondary | ICD-10-CM | POA: Diagnosis not present

## 2022-01-27 DIAGNOSIS — E785 Hyperlipidemia, unspecified: Secondary | ICD-10-CM | POA: Diagnosis not present

## 2022-01-27 DIAGNOSIS — C541 Malignant neoplasm of endometrium: Secondary | ICD-10-CM | POA: Diagnosis not present

## 2022-01-27 NOTE — Patient Instructions (Signed)
Thank you, [place] for allowing [place] to provide your care today. Today we discussed recent surgery and blood pressure medications.   ? ?Labs/Tests Ordered: ? ?Lab Orders    ?     BMP8+Anion Gap    ?     Lipid Profile     ? ?Referrals Ordered:  ?Referral Orders  ?No referral(s) requested today  ?  ? ?Medication Changes:  ?There are no discontinued medications.  ? ?No orders of the defined types were placed in this encounter. ?  ? ?Health Maintenance Screening: ?Diabetes Health Maintenance Due  ?Topic Date Due  ? LIPID PANEL  06/07/2020  ? URINE MICROALBUMIN  04/04/2021  ? OPHTHALMOLOGY EXAM  10/15/2021  ? FOOT EXAM  12/09/2021  ? HEMOGLOBIN A1C  03/17/2022  ?  ? ?Instructions: ?- Remember your follow up appointment with your GYN oncologist on 04/10 at 4:20pm  ? ?Follow up: 3 months  ? ?Remember: If you have any questions or concerns, call our clinic at [phone] or after hours call [phone] and ask for the internal medicine resident on call. ? ?Marianna Payment, D.O. ?[place] ? ? ? ?

## 2022-01-27 NOTE — Progress Notes (Signed)
? ? ?Subjective:  ?CC: HTN ? ?HPI: ? ?Kristy [name] is a 66 y.o. female with a past medical history stated below and presents today for HTN. Please see problem based assessment and plan for additional details. ? ?Past Medical History:  ?Diagnosis Date  ? Arthritis   ? Chronic kidney disease   ? Colon polyps   ? Colon polyps   ? Diabetes mellitus   ? Diverticulosis   ? Endometrial cancer ([place])   ? Family history of adverse reaction to anesthesia   ? Heart attack during anesthesia  ? Hyperlipidemia   ? Hypertension   ? Pneumonia due to COVID-19 virus 11/24/2020  ? Renal artery stenosis ([place]) 2007  ?  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 Albertine Patricia  ? Sleep apnea 2007  ?  status post polysomnogram 2007 , suggested use of CPAP  ? Ventricular hypertrophy 2006  ?  a 2-D echo in 2006, ejection fraction 55%, mild tricuspid regurgitation noted as well, 2-D echo November 14, 2839 showed diastolic dysfunction with LVEF normal of 65%  ? ? ?Current Outpatient Medications on File Prior to Visit  ?Medication Sig Dispense Refill  ? Accu-Chek FastClix Lancets MISC Check blood sugar two times a day (Patient taking differently: 1 each by Other route in the morning and at bedtime.) 102 each 5  ? apixaban (ELIQUIS) 2.5 MG TABS tablet Take 1 tablet by mouth 2 times daily. 60 tablet 3  ? Ascorbic Acid (VITAMIN C) 1000 MG tablet Take 1,000 mg by mouth daily.    ? aspirin 81 MG EC tablet Take 81 mg by mouth daily.    ? atorvastatin (LIPITOR) 40 MG tablet Take 1 tablet (40 mg total) by mouth daily. 90 tablet 3  ? Black Elderberry (SAMBUCUS ELDERBERRY PO) Take 1 tablet by mouth daily.    ? Blood Glucose Monitoring Suppl (ACCU-CHEK GUIDE) w/Device KIT 1 each by Does not apply route 2 (two) times daily. 1 kit 1  ? carvedilol (COREG) 12.5 MG tablet Take 1 tablet  (12.5 mg total) by mouth 2 (two) times daily. 60 tablet 0  ? glucose 4 GM chewable tablet Chew 1 tablet by mouth as needed for low blood sugar.    ? glucose blood (ACCU-CHEK GUIDE) test strip Check blood sugar two times a day (Patient taking differently: 1 each by Other route in the morning and at bedtime.) 100 each 5  ? Insulin Degludec-Liraglutide (XULTOPHY) 100-3.6 UNIT-MG/ML SOPN Inject 40 Units into the skin daily. 15 mL 5  ? Insulin Pen Needle (B-D UF III MINI PEN NEEDLES) 31G X 5 MM MISC 1 Device by Does not apply route 3 (three) times daily before meals. Check daily 3X before meals 100 each 12  ? Insulin Pen Needle (UNIFINE PENTIPS) 31G X 6 MM MISC See admin instructions. 100 each PRN  ? Multiple Vitamin (MULTIVITAMIN) tablet Take 1 tablet by mouth daily.    ? Probiotic Product (PROBIOTIC DAILY) CAPS Take 1 tablet by mouth daily at 12 noon.    ? senna-docusate (SENOKOT-S) 8.6-50 MG tablet Take 2 tablets by mouth at bedtime. For AFTER surgery, do not take if having diarrhea 30 tablet 0  ? spironolactone (ALDACTONE) 50 MG tablet Take 1 tablet by mouth once daily 90 tablet 0  ? traMADol (ULTRAM) 50 MG tablet Take 2 tablets (100 mg  total) by mouth every 6 (six) hours as needed for severe pain (must last 30 days) 75 tablet 0  ? ?No current facility-administered medications on file prior to visit.  ? ? ?Family History  ?Problem Relation Age of Onset  ? Diabetes Mother   ? Cancer Mother   ? Ovarian cancer Mother   ? Diabetes Father   ? Colon polyps Sister   ? Hypertension Sister   ? Breast cancer Sister   ? Colon polyps Sister   ? Hypertension Sister   ? Hypertension Daughter   ? Stroke Daughter   ? Hypertension Daughter   ? Hypertension Daughter   ? Hypertension Daughter   ? Bipolar disorder Son   ? Alcohol abuse Son   ? Drug abuse Son   ? Esophageal cancer Maternal Aunt   ? Breast cancer Cousin   ? Cancer Other   ?     breast cancer died in her 62s  ? Colon cancer Neg Hx   ? Stomach cancer Neg Hx   ? Rectal  cancer Neg Hx   ? ? ?Social History  ? ?Socioeconomic History  ? Marital status: Married  ?  Spouse name: Not on file  ? Number of children: Not on file  ? Years of education: 58  ? Highest education level: Not on file  ?Occupational History  ? Not on file  ?Tobacco Use  ? Smoking status: Former  ?  Packs/day: 1.00  ?  Years: 25.00  ?  Pack years: 25.00  ?  Types: Cigarettes  ?  Quit date: 10/26/2005  ?  Years since quitting: 16.2  ? Smokeless tobacco: Never  ?Vaping Use  ? Vaping Use: Never used  ?Substance and Sexual Activity  ? Alcohol use: No  ?  Alcohol/week: 0.0 standard drinks  ? Drug use: No  ? Sexual activity: Not Currently  ?  Partners: Male  ?  Birth control/protection: Abstinence  ?Other Topics Concern  ? Not on file  ?Social History Narrative  ? Current Social History 11/14/2020    ?   ? Patient lives with spouse in a home which is 1 story. There are steps up to the entrance with a handrail which the patient uses.   ?   ? Patient's method of transportation is personal car.  ?   ? The highest level of education was some high school, 11th grade.  ?   ? The patient currently disabled.  ?   ? Identified important Relationships are "my husband"   ?   ? Pets : a dog, Barnabas Lister  ?    ? Interests / Fun: "Nothing since Covid"   ?   ? Current Stressors: None   ?   ? Religious / Personal Beliefs: Non-Christian   ?   ? Other: None   ? ?Social Determinants of Health  ? ?Financial Resource Strain: Not on file  ?Food Insecurity: Not on file  ?Transportation Needs: Not on file  ?Physical Activity: Not on file  ?Stress: Not on file  ?Social Connections: Not on file  ?Intimate Partner Violence: Not on file  ? ? ?Review of Systems: ?ROS negative except for what is noted on the assessment and plan. ? ?Objective:  ? ?Vitals:  ? 01/27/22 0929  ?BP: (!) 157/113  ?Pulse: 98  ?Temp: 98.3 ?F (36.8 ?C)  ?TempSrc: Oral  ?SpO2: 100%  ?Weight: 213 lb 14.4 oz (97 kg)  ?Height: $RemoveB'5\' 2"'NZOJixtp$  (1.575 m)  ? ? ?Physical Exam: ?Gen:  A&O x3 and in no  apparent distress, well appearing and nourished. ?Neck: no masses or nodules, AROM intact. ?CV: RRR, no murmurs, S1/S2 presents, minimal lower extremity swelling ?Resp: Clear to ascultation bilaterally  ?Abd: BS (+) x4, soft, non-tender abdomen, without hepatosplenomegaly or masses ?MSK: Grossly normal AROM and strength x4 extremities. ?Skin: good skin turgor, no rashes, unusual bruising, or prominent lesions.  ?Neuro: No focal deficits, grossly normal sensation and coordination.  ?Psych: Oriented x3 and responding appropriately. Intact memory, normal mood, judgement, affect, and insight.  ? ? ?Assessment & Plan:  ?See Encounters Tab for problem based charting. ? ?Patient discussed with Dr. Philipp Ovens ? ? ?Marianna Payment, D.O. ?[facility]  PGY-3 ?Pager: [phone]  Phone: [phone] ?Date 01/29/2022  Time 8:48 AM ? ?

## 2022-01-28 ENCOUNTER — Other Ambulatory Visit (HOSPITAL_COMMUNITY): Payer: Self-pay

## 2022-01-28 ENCOUNTER — Telehealth (HOSPITAL_COMMUNITY): Payer: Self-pay | Admitting: Pharmacist

## 2022-01-28 DIAGNOSIS — I82409 Acute embolism and thrombosis of unspecified deep veins of unspecified lower extremity: Secondary | ICD-10-CM | POA: Insufficient documentation

## 2022-01-28 DIAGNOSIS — E875 Hyperkalemia: Secondary | ICD-10-CM | POA: Insufficient documentation

## 2022-01-28 LAB — BMP8+ANION GAP
Anion Gap: 14 mmol/L (ref 10.0–18.0)
BUN/Creatinine Ratio: 18 (ref 12–28)
BUN: 41 mg/dL — ABNORMAL HIGH (ref 8–27)
CO2: 21 mmol/L (ref 20–29)
Calcium: 10.6 mg/dL — ABNORMAL HIGH (ref 8.7–10.3)
Chloride: 105 mmol/L (ref 96–106)
Creatinine, Ser: 2.23 mg/dL — ABNORMAL HIGH (ref 0.57–1.00)
Glucose: 150 mg/dL — ABNORMAL HIGH (ref 70–99)
Potassium: 5.4 mmol/L — ABNORMAL HIGH (ref 3.5–5.2)
Sodium: 140 mmol/L (ref 134–144)
eGFR: 24 mL/min/{1.73_m2} — ABNORMAL LOW (ref >59)

## 2022-01-28 LAB — LIPID PANEL
Chol/HDL Ratio: 4.2 ratio (ref 0.0–4.4)
Cholesterol, Total: 160 mg/dL (ref 100–199)
HDL: 38 mg/dL — ABNORMAL LOW (ref >39)
LDL Chol Calc (NIH): 96 mg/dL (ref 0–99)
Triglycerides: 145 mg/dL (ref 0–149)
VLDL Cholesterol Cal: 26 mg/dL (ref 5–40)

## 2022-01-28 NOTE — Telephone Encounter (Signed)
Pharmacy Transitions of Care Follow-up Telephone Call ? ?Date of discharge: 01/16/22  ?Discharge Diagnosis: RLE DVT ? ?How have you been since you were released from the hospital? Doing better  ? ?Medication changes made at discharge: ? - START: Eliquis ? - STOPPED: [place] ?  ? ?Medication changes verified by the patient? Yes ?  ? ?Medication Accessibility: ? ?Home Pharmacy: [facility]  ? ?Was the patient provided with refills on discharged medications? Yes  ? ?Have all prescriptions been transferred from [facility] to home pharmacy? Yes  ? ?Is the patient able to afford medications? Yes ?Notable copays: $0 for 30 day supply ?  ? ?Medication Review: ? ? ?Patient declined education (was done in the hospital) ? ?  ?Follow-up Appointments: ? ? ?[facility] f/u appt confirmed?  Scheduled to see Jeral Pinch on 02/02/22 '@2'$ ;15pm ? ?If their condition worsens, is the pt aware to call PCP or go to the Emergency Dept.? Yes ? ?Final Patient Assessment: ?-Pt is doing well.  ?-Declined patient education ?-Pt has post discharge appointment and patient wants med transferred to [facility] ? ?Garnet Sierras, PharmD ?

## 2022-01-28 NOTE — Assessment & Plan Note (Addendum)
Patient presents for a follow up form her recent hospitalization. She was found to have some lower right extremity pain and swelling. Doppler US showed age indeterminate DVT in the right femoral and popliteal veins. This was likely precipitated 2/2 to her underlying cancer. She was started on eliquis 2.5 mg BID due toher reduced GFR. She is tolerating this medication well with out evidence of bleeding.  ? ?Plan: ?- Cont eliquis 2.5 mg BID for 6 month for provoked DVT ? ?

## 2022-01-28 NOTE — Assessment & Plan Note (Addendum)
Patient presents with significantly elevated BP. Her BP has been managed by her nephrologist. She is currently on the following medications: ? ?- Coreg 25 mg bid ?- Losartan-Hydrochlorothiazide 100-25 mg q day ?- Spironolactone 25 mg q AM and 50 mg q PM ? ?She has tolerated these medications well without SE.  ? ? ?Plan: ?- BMP for kidney function and electrolytes ?- Will adjust BP medications based on results ? ?**ADDENDUM**  ? ?BMP results: ? ?BMET ?   ?Component Value Date/Time  ? NA 140 01/27/2022 1004  ? K 5.4 (H) 01/27/2022 1004  ? CL 105 01/27/2022 1004  ? CO2 21 01/27/2022 1004  ? GLUCOSE 150 (H) 01/27/2022 1004  ? GLUCOSE 152 (H) 01/16/2022 0405  ? BUN 41 (H) 01/27/2022 1004  ? CREATININE 2.23 (H) 01/27/2022 1004  ? CREATININE 2.29 (H) 12/25/2021 1108  ? CREATININE 1.11 (H) 01/10/2015 1534  ? CALCIUM 10.6 (H) 01/27/2022 1004  ? EGFR 24 (L) 01/27/2022 1004  ? GFRNONAA 24 (L) 01/16/2022 0405  ? GFRNONAA 23 (L) 12/25/2021 1108  ? GFRNONAA 55 (L) 01/10/2015 1534  ? ?Patient has mild hyperkalemia and hypercalcemia. Likely 2/2 to her underlying CKD and blood pressure medications. I will attempt to reach out to the patient's nephrologist, Dr. [place] to discuss changes to the patient's BP medications. Will hold spironolactone at this time to avoid further hyperkalemia. Will add a albumin to her labs to further evaluate her calcium level.  ?- Will attempt to reach out to nephrologist ?- will hold spironolactone ?- will add on albumin level ?

## 2022-01-28 NOTE — Assessment & Plan Note (Addendum)
Patient presents for hospital follow up after having a total hysterectomy with bilateral salpingo oophorectomy complicated by age indeterminate DVT. She tolerated the procedure well and denies any complaints since. 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 She has a follow up appointment on 04/10 with gyn onc.  ? ? ?Plan: ?- Counseled patient regarding recovery expectation in the perioperative period ?- Reminded patient of her upcoming appointment.  ? ?

## 2022-01-29 ENCOUNTER — Encounter: Payer: Self-pay | Admitting: Internal Medicine

## 2022-01-30 LAB — ALBUMIN: Albumin: 4.2 g/dL (ref 3.8–4.8)

## 2022-01-30 LAB — SPECIMEN STATUS REPORT

## 2022-02-02 ENCOUNTER — Inpatient Hospital Stay: Payer: Medicare PPO | Attending: Gynecologic Oncology | Admitting: Gynecologic Oncology

## 2022-02-02 ENCOUNTER — Other Ambulatory Visit: Payer: Self-pay

## 2022-02-02 ENCOUNTER — Encounter: Payer: Self-pay | Admitting: Gynecologic Oncology

## 2022-02-02 VITALS — BP 179/75 | HR 98 | Temp 97.4°F | Resp 18 | Ht 62.0 in | Wt 209.0 lb

## 2022-02-02 DIAGNOSIS — I82401 Acute embolism and thrombosis of unspecified deep veins of right lower extremity: Secondary | ICD-10-CM

## 2022-02-02 DIAGNOSIS — Z7189 Other specified counseling: Secondary | ICD-10-CM

## 2022-02-02 DIAGNOSIS — Z87410 Personal history of cervical dysplasia: Secondary | ICD-10-CM

## 2022-02-02 DIAGNOSIS — C541 Malignant neoplasm of endometrium: Secondary | ICD-10-CM

## 2022-02-02 NOTE — Patient Instructions (Signed)
Was good to see you today.  You are healing well from surgery.  Remember, no heavy lifting for at least 6 weeks after surgery and nothing in the vagina for at least 8 weeks. ? ?We will plan on visits every 6 months initially.  I will see you back in October of this year.  Please call back sometime in August or September to get this visit scheduled. ? ?If you develop any of the signs or symptoms that we discussed today, please call to see me sooner. ?

## 2022-02-02 NOTE — Progress Notes (Signed)
Gynecologic Oncology Return Clinic Visit ? ?02/02/22 ? ?Reason for Visit: Follow-up after surgery, treatment discussion ? ?Treatment History: ?She denies any history of abnormal Pap smears until recently.  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 The cervix was cauterized beyond the edge of the leep biopsy. When she was seen for her follow-up pap test on 1/18. Patient underwent colposcopy. Cervix was noted to be very stenotic.  No acetowhite changes were noted after application of acetic acid.  After application of Lugol's, as there was decreased uptake at 6:00, biopsy was performed. Because of her stenotic cervix, a scalpel was used to open the os with drainage of a large amount of brown mucus. ECC was then performed.  Pelvic ultrasound exam was also performed given these findings and showed uterus measures 9 x 3.8 x 5.6 cm with an endometrial lining of 2.4 cm.  Masses seen within the endometrium as well as a small amount of fluid.  Cervix is dilated and filled with fluid.   ?  ?Low-grade dysplasia was noted from the 6:00 cervical biopsy.  Endocervix biopsy showed minute detached fragments of atypical glandular epithelium, favored to be endometrial within a necroinflammatory diathesis.  Minute detached fragment of dysplastic metaplastic squamous epithelium, at least late grade also noted.  Endometrial biopsy showed scant detached atypical glandular epithelium, favored to be endometrial within a background of focal atypical hyperplasia.  Changes focally suggestive of an endometrial polyp.  Comment is that both the endocervical and endometrial sampling showed detached fragments of highly atypical glandular epithelium.  Additional sampling is recommended. ?  ?On 12/23/2021, patient underwent hysteroscopy, D&C under hysteroscopic guidance, ECC, cold knife cone, and Mirena IUD placement.  Findings at the time of surgery were a normal and somewhat  atrophic cervix.  Multiple fibroids and polypoid appearing structures within the endometrium, some of which appeared nodular with calcifications.  Final pathology revealed high-grade endometrial carcinoma, FIGO grade 3, with clear cell and high-grade serous component.  Focal sarcomatous component cannot be ruled out.  ECC showed blood and fibrin.  Cervical conization showed low-grade dysplasia. ?  ?Given high-grade pathology, recommendation was made to defer patient's scheduled orthopedic surgery.  On 3/21, she underwent robotic assisted TLH with BSO, bilateral sentinel lymph node biopsy, omentectomy, lysis of adhesion, mini lap, and periurethral laceration repair.  Findings at the time of surgery included an enlarged uterus.  Umbilical hernia noted.  No obvious omental or intra-abdominal/pelvic disease.  Mild pelvic adhesive disease.  3 cm paratubal cyst on the right with torsed right ovary.  No adenopathy. ?  ?Patient's postoperative course was complicated by development of mild tachycardia on postoperative day #2.  Work-up for this found a DVT of the right common femoral vein, right femoral vein, and right popliteal vein of indeterminate age.  Given patient's inability to get IV contrast in the setting of her kidney disease as well as postoperative atelectasis, VQ scan was deferred.  Patient was started on therapeutic anticoagulation with a heparin drip for 24 hours.  She was then transition to Eliquis at a decreased dose per hematologist. ? ?Interval History: ?Doing well.  Denies any significant abdominal or pelvic pain.  Denies vaginal bleeding or discharge.  Reports regular bowel and bladder function. ? ?Saw her primary care provider last week.  She is going to continue on the same decreased dose of Eliquis. ? ?Her daughter also reached out to her orthopedic surgeon.  Plan will  be to delay surgery for at least several months after her recent DVT diagnosis. ? ?Past Medical/Surgical History: ?Past Medical  History:  ?Diagnosis Date  ? Arthritis   ? Chronic kidney disease   ? Colon polyps   ? Colon polyps   ? Diabetes mellitus   ? Diverticulosis   ? Endometrial cancer ([place])   ? Family history of adverse reaction to anesthesia   ? Heart attack during anesthesia  ? Hyperlipidemia   ? Hypertension   ? Pneumonia due to COVID-19 virus 11/24/2020  ? Renal artery stenosis ([place]) 2007  ?  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 Albertine Patricia  ? Sleep apnea 2007  ?  status post polysomnogram 2007 , suggested use of CPAP  ? Ventricular hypertrophy 2006  ?  a 2-D echo in 2006, ejection fraction 55%, mild tricuspid regurgitation noted as well, 2-D echo November 13, 8142 showed diastolic dysfunction with LVEF normal of 65%  ? ? ?Past Surgical History:  ?Procedure Laterality Date  ? BREAST BIOPSY Right 2016  ? CERVICAL CONIZATION W/BX N/A 12/23/2021  ? Procedure: CONIZATION CERVIX WITH BIOPSY;  Surgeon: Lafonda Mosses, MD;  [facility];  Service: Gynecology;  Laterality: N/A;  ? COLONOSCOPY  02/03/2010  ? DILATATION & CURETTAGE/HYSTEROSCOPY WITH MYOSURE N/A 12/23/2021  ? Procedure: [place];  Surgeon: Lafonda Mosses, MD;  [facility];  Service: Gynecology;  Laterality: N/A;  ? DILATION AND CURETTAGE OF UTERUS N/A 12/23/2021  ? Procedure: ENDOCERVICAL CURETTAGE;  Surgeon: Lafonda Mosses, MD;  [facility];  Service: Gynecology;  Laterality: N/A;  ? INTRAUTERINE DEVICE (IUD) INSERTION N/A 12/23/2021  ? Procedure: INTRAUTERINE DEVICE (IUD) INSERTION MIRENA;  Surgeon: Lafonda Mosses, MD;  [facility];  Service: Gynecology;  Laterality: N/A;  ? LYMPH NODE DISSECTION N/A 01/13/2022  ? Procedure: LYMPH NODE DISSECTION;  Surgeon: Lafonda Mosses, MD;  [facility];  Service: Gynecology;   Laterality: N/A;  ? RENAL ARTERY STENT  2007  ?  patient is status post renal artery stent placement May 2007 and that was done secondary to renal artery stenosis, fibromuscular dysplasia  ? REPLACEMENT TOTAL KNEE  02/2011-Dr. Ninfa Linden  ? S/P Right Total Knee Replacement May/2012  ? ROBOTIC ASSISTED TOTAL HYSTERECTOMY WITH BILATERAL SALPINGO OOPHERECTOMY Bilateral 01/13/2022  ? Procedure: XI ROBOTIC ASSISTED TOTAL HYSTERECTOMY WITH BILATERAL SALPINGO OOPHORECTOMY mini laparotomy  OMENTECTOMY;  Surgeon: Lafonda Mosses, MD;  [facility];  Service: Gynecology;  Laterality: Bilateral;  ? SENTINEL NODE BIOPSY Bilateral 01/13/2022  ? Procedure: SENTINEL NODE BIOPSY;  Surgeon: Lafonda Mosses, MD;  [facility];  Service: Gynecology;  Laterality: Bilateral;  ? TUBAL LIGATION    ? ? ?Family History  ?Problem Relation Age of Onset  ? Diabetes Mother   ? Cancer Mother   ? Ovarian cancer Mother   ? Diabetes Father   ? Colon polyps Sister   ? Hypertension Sister   ? Breast cancer Sister   ? Colon polyps Sister   ? Hypertension Sister   ? Hypertension Daughter   ? Stroke Daughter   ? Hypertension Daughter   ? Hypertension Daughter   ? Hypertension Daughter   ? Bipolar disorder Son   ? Alcohol abuse Son   ? Drug abuse Son   ? Esophageal cancer Maternal Aunt   ?  Breast cancer Cousin   ? Cancer Other   ?     breast cancer died in her 47s  ? Colon cancer Neg Hx   ? Stomach cancer Neg Hx   ? Rectal cancer Neg Hx   ? ? ?Social History  ? ?Socioeconomic History  ? Marital status: Married  ?  Spouse name: Not on file  ? Number of children: Not on file  ? Years of education: 27  ? Highest education level: Not on file  ?Occupational History  ? Not on file  ?Tobacco Use  ? Smoking status: Former  ?  Packs/day: 1.00  ?  Years: 25.00  ?  Pack years: 25.00  ?  Types: Cigarettes  ?  Quit date: 10/26/2005  ?  Years since quitting: 16.2  ? Smokeless tobacco: Never  ?Vaping Use  ? Vaping Use: Never used  ?Substance and Sexual  Activity  ? Alcohol use: No  ?  Alcohol/week: 0.0 standard drinks  ? Drug use: No  ? Sexual activity: Not Currently  ?  Partners: Male  ?  Birth control/protection: Abstinence  ?Other Topics Concern  ? Not on file  ?Social

## 2022-02-03 ENCOUNTER — Other Ambulatory Visit: Payer: Self-pay | Admitting: Internal Medicine

## 2022-02-03 ENCOUNTER — Other Ambulatory Visit (HOSPITAL_COMMUNITY): Payer: Self-pay

## 2022-02-03 DIAGNOSIS — I150 Renovascular hypertension: Secondary | ICD-10-CM

## 2022-02-03 MED ORDER — LOSARTAN POTASSIUM-HCTZ 100-25 MG PO TABS
1.0000 | ORAL_TABLET | Freq: Every day | ORAL | 2 refills | Status: DC
  Filled 2022-02-03: qty 7, 7d supply, fill #0
  Filled 2022-02-10: qty 7, 7d supply, fill #1
  Filled 2022-02-18: qty 7, 7d supply, fill #2
  Filled 2022-03-02: qty 7, 7d supply, fill #3
  Filled 2022-03-03: qty 30, 30d supply, fill #3

## 2022-02-03 NOTE — Progress Notes (Signed)
Internal Medicine Clinic Attending  Case discussed with Dr. Coe  At the time of the visit.  We reviewed the resident's history and exam and pertinent patient test results.  I agree with the assessment, diagnosis, and plan of care documented in the resident's note.  

## 2022-02-03 NOTE — Telephone Encounter (Signed)
Patient called in for refill on losartan-HCTZ at [facility]. Has only one dose left. ?

## 2022-02-04 ENCOUNTER — Other Ambulatory Visit: Payer: Self-pay | Admitting: Internal Medicine

## 2022-02-04 DIAGNOSIS — E1142 Type 2 diabetes mellitus with diabetic polyneuropathy: Secondary | ICD-10-CM

## 2022-02-09 ENCOUNTER — Ambulatory Visit (HOSPITAL_COMMUNITY)
Admission: RE | Admit: 2022-02-09 | Discharge: 2022-02-09 | Disposition: A | Discharge status: Home / Self Care | Payer: Medicare PPO | Source: Ambulatory Visit | Attending: Gynecologic Oncology | Admitting: Gynecologic Oncology

## 2022-02-09 DIAGNOSIS — N281 Cyst of kidney, acquired: Secondary | ICD-10-CM | POA: Diagnosis not present

## 2022-02-10 ENCOUNTER — Other Ambulatory Visit: Payer: Self-pay | Admitting: Gynecologic Oncology

## 2022-02-10 ENCOUNTER — Other Ambulatory Visit: Payer: Self-pay | Admitting: Internal Medicine

## 2022-02-10 ENCOUNTER — Telehealth: Payer: Self-pay | Admitting: *Deleted

## 2022-02-10 DIAGNOSIS — N2889 Other specified disorders of kidney and ureter: Secondary | ICD-10-CM

## 2022-02-10 NOTE — Telephone Encounter (Signed)
Renal U/S resulted for pt.  Result in chart.  ? 3.6 cm complex cystic mass in the left kidney, indeterminate.  Recommend further characterization with MRI.  Joylene John, NP made aware.  ?

## 2022-02-10 NOTE — Telephone Encounter (Signed)
Spoke with pt this afternoon to let know her that Dr.Tucker recommends an MRI for further evaluation based on the results of the renal ultrasound. Scheduled pt twice for the MRI but each time pt needed to reschedule due to her daughter's schedule. Gave pt the phone number to MRI scheduler [phone]) so she can schedule an appropriate time that would work best for her. Pt verbalized understanding.  ?

## 2022-02-11 ENCOUNTER — Other Ambulatory Visit: Payer: Self-pay | Admitting: Internal Medicine

## 2022-02-11 ENCOUNTER — Other Ambulatory Visit (HOSPITAL_COMMUNITY): Payer: Self-pay

## 2022-02-12 ENCOUNTER — Other Ambulatory Visit (HOSPITAL_COMMUNITY): Payer: Self-pay

## 2022-02-12 MED ORDER — CARVEDILOL 12.5 MG PO TABS
12.5000 mg | ORAL_TABLET | Freq: Two times a day (BID) | ORAL | 0 refills | Status: DC
  Filled 2022-02-12: qty 60, 30d supply, fill #0

## 2022-02-13 ENCOUNTER — Other Ambulatory Visit (HOSPITAL_COMMUNITY): Payer: Self-pay

## 2022-02-17 ENCOUNTER — Ambulatory Visit (HOSPITAL_COMMUNITY): Payer: Medicare PPO

## 2022-02-18 ENCOUNTER — Encounter: Payer: Self-pay | Admitting: Orthopaedic Surgery

## 2022-02-18 ENCOUNTER — Ambulatory Visit: Payer: Medicare PPO | Admitting: Orthopaedic Surgery

## 2022-02-18 DIAGNOSIS — M1712 Unilateral primary osteoarthritis, left knee: Secondary | ICD-10-CM | POA: Diagnosis not present

## 2022-02-18 NOTE — Progress Notes (Signed)
? ?Office Visit Note ?  ?Patient: Kristy [name]           ?Date of Birth: 10/10/1956           ?MRN: [id number] ?Visit Date: 02/18/2022 ?             ?Requested by: Marianna Payment, MD ?[address] ?[phone] ?[place],  [place] [address] ?PCP: Marianna Payment, MD ? ? ?Assessment & Plan: ?Visit Diagnoses:  ?1. Unilateral primary osteoarthritis, left knee   ? ? ?Plan: Impression is advanced degenerative joint disease left knee with recent DVT currently treated with Eliquis.  At this point, we have discussed the need to be off of anticoagulants prior to proceeding with total knee arthroplasty and will need to get clearance from her gynecologist.  Patient is unsure of how long she will be on Eliquis.  Once she comes off she will follow-up with [place] or either call us to schedule her total knee arthroplasty. ? ?Follow-Up Instructions: Return if symptoms worsen or fail to improve.  ? ?Orders:  ?No orders of the defined types were placed in this encounter. ? ?No orders of the defined types were placed in this encounter. ? ? ? ? Procedures: ?No procedures performed ? ? ?Clinical Data: ?No additional findings. ? ? ?Subjective: ?Chief Complaint  ?Patient presents with  ? Left Knee - Pain  ? ? ?HPI patient is a pleasant 66 year old female with underlying left knee advanced degenerative joint disease who comes in today with continued pain.  She was initially scheduled for total knee arthroplasty last month but had to cancel this due to being diagnosed with endometrial cancer and needing to undergo hysterectomy which was performed in February.  Unfortunately, few days postop she developed a DVT.  She is now on Eliquis.  Gynecologist notes show that she will need to be on this for several months. ? ? ? ? ?Objective: ?Vital Signs: LMP 01/05/2009  ? ? ? ? ? ?Specialty Comments:  ?No specialty comments available. ? ?Imaging: ?No new imaging ? ? ?PMFS History: ?Patient Active Problem List  ? Diagnosis Date Noted  ? Deep vein thrombosis  (DVT) ([place]) 01/28/2022  ? Hyperkalemia 01/28/2022  ? Endometrial cancer s/p total hysterectomy with bilateral oophorectomy 01/08/2022  ? Anxiety 04/04/2021  ? Lower extremity edema 06/08/2019  ? Primary osteoarthritis of left knee 07/29/2018  ? Obstructive sleep apnea 10/07/2017  ? CKD (chronic kidney disease) stage 4, GFR 15-29 ml/min ([facility]) 06/04/2017  ? Hypercalcemia 06/26/2016  ? Vitamin D deficiency 12/07/2014  ? 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 Healthcare maintenance 08/23/2013  ? Chronic, continuous use of opioids 03/14/2013  ? 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 DM type 2 with diabetic peripheral neuropathy ([place]) 12/06/2012  ? Dyslipidemia 07/21/2006  ? ?Past Medical History:  ?Diagnosis Date  ? Arthritis   ? Chronic kidney disease   ? Colon polyps   ? Colon polyps   ? Diabetes mellitus   ? Diverticulosis   ? Endometrial cancer ([place])   ? Family history of adverse reaction to anesthesia   ? Heart attack during anesthesia  ? Hyperlipidemia   ? Hypertension   ? Pneumonia due to COVID-19 virus 11/24/2020  ? Renal artery stenosis ([place]) 2007  ?  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 Albertine Patricia  ?  Sleep apnea 2007  ?  status post polysomnogram 2007 , suggested use of CPAP  ? Ventricular hypertrophy 2006  ?  a 2-D echo in 2006, ejection fraction 55%, mild tricuspid regurgitation noted as well, 2-D echo November 13, 1273 showed diastolic dysfunction with LVEF normal of 65%  ?  ?Family History  ?Problem Relation Age of Onset  ? Diabetes Mother   ? Cancer Mother   ? Ovarian cancer Mother   ? Diabetes Father   ? Colon polyps Sister   ? Hypertension Sister   ? Breast cancer Sister   ? Colon polyps Sister   ? Hypertension Sister   ? Hypertension Daughter   ? Stroke Daughter   ? Hypertension  Daughter   ? Hypertension Daughter   ? Hypertension Daughter   ? Bipolar disorder Son   ? Alcohol abuse Son   ? Drug abuse Son   ? Esophageal cancer Maternal Aunt   ? Breast cancer Cousin   ? Cancer Other   ?     breast cancer died in her 35s  ? Colon cancer Neg Hx   ? Stomach cancer Neg Hx   ? Rectal cancer Neg Hx   ?  ?Past Surgical History:  ?Procedure Laterality Date  ? BREAST BIOPSY Right 2016  ? CERVICAL CONIZATION W/BX N/A 12/23/2021  ? Procedure: CONIZATION CERVIX WITH BIOPSY;  Surgeon: Lafonda Mosses, MD;  [facility];  Service: Gynecology;  Laterality: N/A;  ? COLONOSCOPY  02/03/2010  ? DILATATION & CURETTAGE/HYSTEROSCOPY WITH MYOSURE N/A 12/23/2021  ? Procedure: [place];  Surgeon: Lafonda Mosses, MD;  [facility];  Service: Gynecology;  Laterality: N/A;  ? DILATION AND CURETTAGE OF UTERUS N/A 12/23/2021  ? Procedure: ENDOCERVICAL CURETTAGE;  Surgeon: Lafonda Mosses, MD;  [facility];  Service: Gynecology;  Laterality: N/A;  ? INTRAUTERINE DEVICE (IUD) INSERTION N/A 12/23/2021  ? Procedure: INTRAUTERINE DEVICE (IUD) INSERTION MIRENA;  Surgeon: Lafonda Mosses, MD;  [facility];  Service: Gynecology;  Laterality: N/A;  ? LYMPH NODE DISSECTION N/A 01/13/2022  ? Procedure: LYMPH NODE DISSECTION;  Surgeon: Lafonda Mosses, MD;  [facility];  Service: Gynecology;  Laterality: N/A;  ? RENAL ARTERY STENT  2007  ?  patient is status post renal artery stent placement May 2007 and that was done secondary to renal artery stenosis, fibromuscular dysplasia  ? REPLACEMENT TOTAL KNEE  02/2011-Dr. Ninfa Linden  ? S/P Right Total Knee Replacement May/2012  ? ROBOTIC ASSISTED TOTAL HYSTERECTOMY WITH BILATERAL SALPINGO OOPHERECTOMY Bilateral 01/13/2022  ? Procedure: XI ROBOTIC ASSISTED TOTAL HYSTERECTOMY WITH BILATERAL SALPINGO OOPHORECTOMY mini laparotomy  OMENTECTOMY;  Surgeon: Lafonda Mosses, MD;  [facility];  Service: Gynecology;   Laterality: Bilateral;  ? SENTINEL NODE BIOPSY Bilateral 01/13/2022  ? Procedure: SENTINEL NODE BIOPSY;  Surgeon: Lafonda Mosses, MD;  [facility];  Service: Gynecology;  Laterality: Bilateral;  ? TUBAL LIGATION    ? ?Social History  ? ?Occupational History  ? Not on file  ?Tobacco Use  ? Smoking status: Former  ?  Packs/day: 1.00  ?  Years: 25.00  ?  Pack years: 25.00  ?  Types: Cigarettes  ?  Quit date: 10/26/2005  ?  Years since quitting: 16.3  ? Smokeless tobacco: Never  ?Vaping Use  ? Vaping Use: Never used  ?Substance and Sexual Activity  ? Alcohol use: No  ?  Alcohol/week: 0.0 standard drinks  ? Drug use: No  ?  Sexual activity: Not Currently  ?  Partners: Male  ?  Birth control/protection: Abstinence  ? ? ? ? ? ? ?

## 2022-02-19 ENCOUNTER — Other Ambulatory Visit (HOSPITAL_COMMUNITY): Payer: Self-pay

## 2022-02-23 ENCOUNTER — Other Ambulatory Visit: Payer: Self-pay | Admitting: Genetic Counselor

## 2022-02-23 ENCOUNTER — Inpatient Hospital Stay: Payer: Medicare PPO | Attending: Gynecologic Oncology | Admitting: Genetic Counselor

## 2022-02-23 ENCOUNTER — Other Ambulatory Visit (HOSPITAL_COMMUNITY): Payer: Self-pay

## 2022-02-23 ENCOUNTER — Inpatient Hospital Stay: Payer: Medicare PPO

## 2022-02-23 ENCOUNTER — Other Ambulatory Visit: Payer: Self-pay

## 2022-02-23 DIAGNOSIS — C541 Malignant neoplasm of endometrium: Secondary | ICD-10-CM

## 2022-02-23 DIAGNOSIS — Z8041 Family history of malignant neoplasm of ovary: Secondary | ICD-10-CM

## 2022-02-23 DIAGNOSIS — Z803 Family history of malignant neoplasm of breast: Secondary | ICD-10-CM

## 2022-02-23 LAB — GENETIC SCREENING ORDER

## 2022-02-23 NOTE — Addendum Note (Signed)
Addended by: Ignacia Bayley on: 02/23/2022 11:27 AM ? ? Modules accepted: Orders ? ?

## 2022-02-23 NOTE — Progress Notes (Signed)
REFERRING PROVIDER: ?Lafonda Mosses, MD ?[address] ?[place],  [place] [address] ? ? ?PRIMARY PROVIDER:  ?Marianna Payment, MD ? ?PRIMARY REASON FOR VISIT:  ?Encounter Diagnoses  ?Name Primary?  ? Endometrial cancer s/p total hysterectomy with bilateral oophorectomy Yes  ? Family history of malignant neoplasm of ovary   ? Family history of breast cancer   ?  ? ?HISTORY OF PRESENT ILLNESS:   ?Ms. Lenart, a 66 y.o. female, was seen for a [place] cancer genetics consultation at the request of Dr. Berline Lopes due to a personal and family history of cancer.  Ms. Pe presents to clinic today to discuss the possibility of a hereditary predisposition to cancer, to discuss genetic testing, and to further clarify her future cancer risks, as well as potential cancer risks for family members.  ? ?In 2022, at the age of 64, Ms. Blann was diagnosed with endometrial cancer with clear cell and high grade serous components s/p TLH with BSO.  Results of MSI and IHC MMR were not available.  ? ? ?RISK FACTORS:  ?Mammogram within the last year: yes; most recent in July 2022 ?Number of breast biopsies: Right breast w/ fibrocystic changes in 2016 ?Colonoscopy: yes; most recent in July 2022; five polyps removed (hyperplastic and tubular adenoma) ?Hysterectomy: yes.  ?Ovaries intact: no.  ?Menarche was at age 71.  ?First live birth at age 70.  ?Menopausal status: yes; older than 50 ?OCP use for approximately 0 years.  ?HRT use: 0 years. ? ?Past Medical History:  ?Diagnosis Date  ? Arthritis   ? Chronic kidney disease   ? Colon polyps   ? Colon polyps   ? Diabetes mellitus   ? Diverticulosis   ? Endometrial cancer ([place])   ? Family history of adverse reaction to anesthesia   ? Heart attack during anesthesia  ? Hyperlipidemia   ? Hypertension   ? Pneumonia due to COVID-19 virus 11/24/2020  ? Renal artery stenosis ([place]) 2007  ?  status post selective bilateral renal angiography, balloon angiopathy of the left renal artery, first diagnosed  in 2007 based on MRA of the renal arteries which 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 Albertine Patricia  ? Sleep apnea 2007  ?  status post polysomnogram 2007 , suggested use of CPAP  ? Ventricular hypertrophy 2006  ?  a 2-D echo in 2006, ejection fraction 55%, mild tricuspid regurgitation noted as well, 2-D echo November 14, 1503 showed diastolic dysfunction with LVEF normal of 65%  ? ? ?Past Surgical History:  ?Procedure Laterality Date  ? BREAST BIOPSY Right 2016  ? CERVICAL CONIZATION W/BX N/A 12/23/2021  ? Procedure: CONIZATION CERVIX WITH BIOPSY;  Surgeon: Lafonda Mosses, MD;  [facility];  Service: Gynecology;  Laterality: N/A;  ? COLONOSCOPY  02/03/2010  ? DILATATION & CURETTAGE/HYSTEROSCOPY WITH MYOSURE N/A 12/23/2021  ? Procedure: [place];  Surgeon: Lafonda Mosses, MD;  [facility];  Service: Gynecology;  Laterality: N/A;  ? DILATION AND CURETTAGE OF UTERUS N/A 12/23/2021  ? Procedure: ENDOCERVICAL CURETTAGE;  Surgeon: Lafonda Mosses, MD;  [facility];  Service: Gynecology;  Laterality: N/A;  ? INTRAUTERINE DEVICE (IUD) INSERTION N/A 12/23/2021  ? Procedure: INTRAUTERINE DEVICE (IUD) INSERTION MIRENA;  Surgeon: Lafonda Mosses, MD;  [facility];  Service: Gynecology;  Laterality: N/A;  ? LYMPH NODE DISSECTION N/A 01/13/2022  ? Procedure: LYMPH NODE DISSECTION;  Surgeon: Lafonda Mosses, MD;  [facility];  Service:  Gynecology;  Laterality: N/A;  ? RENAL ARTERY STENT  2007  ?  patient is status post renal artery stent placement May 2007 and that was done secondary to renal artery stenosis, fibromuscular dysplasia  ? REPLACEMENT TOTAL KNEE  02/2011-Dr. Ninfa Linden  ? S/P Right Total Knee Replacement May/2012  ? ROBOTIC ASSISTED TOTAL HYSTERECTOMY WITH BILATERAL SALPINGO OOPHERECTOMY Bilateral 01/13/2022  ? Procedure: XI ROBOTIC ASSISTED TOTAL HYSTERECTOMY WITH BILATERAL SALPINGO OOPHORECTOMY mini laparotomy  OMENTECTOMY;   Surgeon: Lafonda Mosses, MD;  [facility];  Service: Gynecology;  Laterality: Bilateral;  ? SENTINEL NODE BIOPSY Bilateral 01/13/2022  ? Procedure: SENTINEL NODE BIOPSY;  Surgeon: Lafonda Mosses, MD;  [facility];  Service: Gynecology;  Laterality: Bilateral;  ? TUBAL LIGATION    ? ? ?FAMILY HISTORY:  ?We obtained a detailed, 4-generation family history.  Significant diagnoses are listed below: ?Family History  ?Problem Relation Age of Onset  ? Ovarian cancer Mother   ?     dx late 103s  ? Breast cancer Sister 43  ? Esophageal cancer Maternal Aunt   ?     d. after age 4  ? Kidney cancer Paternal Aunt   ?     dx after age 52  ? Breast cancer Other   ?     maternal female cousin; dx 52s  ? ? ? ?Ms. Dung reported that her sister recently had negative genetic testing.  No report was available for review today. Ms. Buffalo is unaware of other previous family history of genetic testing for hereditary cancer risks. There is no reported Ashkenazi Jewish ancestry. There is no known consanguinity. ? ?GENETIC COUNSELING ASSESSMENT: Ms. Larkin is a 66 y.o. female with a personal and family history of cancer which is somewhat suggestive of a hereditary cancer syndrome and predisposition to cancer given the presence of related cancers in the family. We, therefore, discussed and recommended the following at today's visit.  ? ?DISCUSSION: We discussed that 5 - 10% of cancer is hereditary.  Most cases of hereditary ovarian cancer are associated with mutations in BRCA1/2.  There are other genes that can be associated with hereditary uterine, breast, and/or ovarian cancer syndromes.  We discussed that testing is beneficial for several reasons including knowing how to follow individuals for their cancer risks, and understanding if other family members could be at risk for cancer and allowing them to undergo genetic testing.  ? ?We reviewed the characteristics, features and inheritance patterns of hereditary cancer  syndromes. We also discussed genetic testing, including the appropriate family members to test, the process of testing, insurance coverage and turn-around-time for results. We discussed the implications of a negative, positive, carrier and/or variant of uncertain significant result. We recommended Ms. Cianci pursue genetic testing for a panel that includes genes associated with uterine, breast, ovarian, and kidney cancer.  ? ?The CancerNext-Expanded gene panel offered by [facility] and includes sequencing, rearrangement, and RNA analysis for the following 77 genes: AIP, ALK, APC, ATM, AXIN2, BAP1, BARD1, BLM, BMPR1A, BRCA1, BRCA2, BRIP1, CDC73, CDH1, CDK4, CDKN1B, CDKN2A, CHEK2, CTNNA1, DICER1, FANCC, FH, FLCN, GALNT12, KIF1B, LZTR1, MAX, MEN1, MET, MLH1, MSH2, MSH3, MSH6, MUTYH, NBN, NF1, NF2, NTHL1, PALB2, PHOX2B, PMS2, POT1, PRKAR1A, PTCH1, PTEN, RAD51C, RAD51D, RB1, RECQL, RET, SDHA, SDHAF2, SDHB, SDHC, SDHD, SMAD4, SMARCA4, SMARCB1, SMARCE1, STK11, SUFU, TMEM127, TP53, TSC1, TSC2, VHL and XRCC2 (sequencing and deletion/duplication); EGFR, EGLN1, HOXB13, KIT, MITF, PDGFRA, POLD1, and POLE (sequencing only); EPCAM and GREM1 (deletion/duplication only).  ? ?Based on  Ms. Baldi personal and family history of cancer, she meets medical criteria for genetic testing. Despite that she meets criteria, she may still have an out of pocket cost. We discussed that if her out of pocket cost for testing is over $100, the laboratory should contact her and discuss the self-pay prices and/or patient pay assistance programs.   ? ?PLAN: After considering the risks, benefits, and limitations, Ms. Britain provided informed consent to pursue genetic testing and the blood sample was sent to [facility] for analysis of the CancerNext-Expanded +RNAinsight Panel. Results should be available within approximately 3 weeks' time, at which point they will be disclosed by telephone to Ms. Boxley, as will any additional recommendations  warranted by these results. Ms. Sachs will receive a summary of her genetic counseling visit and a copy of her results once available. This information will also be available in Epic.  ? ?Lastly, we encour

## 2022-02-25 ENCOUNTER — Ambulatory Visit (HOSPITAL_COMMUNITY)
Admission: RE | Admit: 2022-02-25 | Discharge: 2022-02-25 | Disposition: A | Discharge status: Home / Self Care | Payer: Medicare PPO | Source: Ambulatory Visit | Attending: Gynecologic Oncology | Admitting: Gynecologic Oncology

## 2022-02-25 ENCOUNTER — Encounter: Payer: Self-pay | Admitting: Genetic Counselor

## 2022-02-25 DIAGNOSIS — Z803 Family history of malignant neoplasm of breast: Secondary | ICD-10-CM | POA: Insufficient documentation

## 2022-02-25 DIAGNOSIS — Z8041 Family history of malignant neoplasm of ovary: Secondary | ICD-10-CM

## 2022-02-25 DIAGNOSIS — K8020 Calculus of gallbladder without cholecystitis without obstruction: Secondary | ICD-10-CM | POA: Diagnosis not present

## 2022-02-25 DIAGNOSIS — K5730 Diverticulosis of large intestine without perforation or abscess without bleeding: Secondary | ICD-10-CM | POA: Diagnosis not present

## 2022-02-25 DIAGNOSIS — N2889 Other specified disorders of kidney and ureter: Secondary | ICD-10-CM | POA: Diagnosis not present

## 2022-02-25 DIAGNOSIS — N281 Cyst of kidney, acquired: Secondary | ICD-10-CM | POA: Diagnosis not present

## 2022-02-25 HISTORY — DX: Family history of malignant neoplasm of breast: Z80.3

## 2022-02-25 HISTORY — DX: Family history of malignant neoplasm of ovary: Z80.41

## 2022-02-25 MED ORDER — GADOBUTROL 1 MMOL/ML IV SOLN
10.0000 mL | Freq: Once | INTRAVENOUS | Status: AC | PRN
  Administered 2022-02-25: 10 mL via INTRAVENOUS

## 2022-02-27 ENCOUNTER — Telehealth: Payer: Self-pay | Admitting: Gynecologic Oncology

## 2022-02-27 NOTE — Telephone Encounter (Signed)
Called patient to review MRI results. Will plan to repeat imaging of kidney in 6 months. All questions answered. ? ?Jeral Pinch MD ?Gynecologic Oncology ? ?

## 2022-03-02 ENCOUNTER — Other Ambulatory Visit (HOSPITAL_COMMUNITY): Payer: Self-pay

## 2022-03-02 ENCOUNTER — Other Ambulatory Visit: Payer: Self-pay | Admitting: Internal Medicine

## 2022-03-02 DIAGNOSIS — F1190 Opioid use, unspecified, uncomplicated: Secondary | ICD-10-CM

## 2022-03-03 ENCOUNTER — Other Ambulatory Visit (HOSPITAL_COMMUNITY): Payer: Self-pay

## 2022-03-04 ENCOUNTER — Other Ambulatory Visit (HOSPITAL_COMMUNITY): Payer: Self-pay

## 2022-03-04 NOTE — Telephone Encounter (Signed)
traMADol (ULTRAM) 50 MG tablet, ? ?Insulin Pen Needle (B-D UF III MINI PEN NEEDLES) 31G X 5 MM MISC, REFILL REQUEST @ [facility]. ?

## 2022-03-06 ENCOUNTER — Other Ambulatory Visit (HOSPITAL_COMMUNITY): Payer: Self-pay

## 2022-03-06 MED ORDER — TRAMADOL HCL 50 MG PO TABS
100.0000 mg | ORAL_TABLET | Freq: Four times a day (QID) | ORAL | 0 refills | Status: DC | PRN
  Filled 2022-03-06: qty 75, 10d supply, fill #0
  Filled 2022-03-06: qty 75, 30d supply, fill #0

## 2022-03-10 ENCOUNTER — Telehealth: Payer: Self-pay

## 2022-03-10 ENCOUNTER — Other Ambulatory Visit (HOSPITAL_COMMUNITY): Payer: Self-pay

## 2022-03-10 NOTE — Telephone Encounter (Signed)
Received a call from [name] this afternoon. Patient reports she went to refill her eliquis and was informed that it would be $151 versus what she normally pays ($47). Patient reports receiving a grant for this while in the hospital. The pharmacy instructed the patient to contact our office.  ? ?Instructed patient that our office will look into this and someone will call and follow up with her. Patient verbalized understanding and would prefer to be called on her house phone.  ?

## 2022-03-10 NOTE — Telephone Encounter (Signed)
RN spoke with [facility]. They have been billing patients insurance ([employer] part D). The cost has increased to $151. They can see on the prescription a note that indicates GYN funds to cover the cost but there is no way for them to bill that.  ? ?RN reached out to [employer]) and Gwinda [place] (Social Work) for assistance with this matter. Voicemail's were left for both of them requesting a return call.  ? ?Patient has been notified of the above information. She has a weeks worth of eliquis left before she runs out. Instructed that our office is working on this and will keep her updated. Patient appreciative of assistance and verbalized understanding.  ?

## 2022-03-11 ENCOUNTER — Other Ambulatory Visit (HOSPITAL_COMMUNITY): Payer: Self-pay

## 2022-03-11 NOTE — Telephone Encounter (Signed)
Spoke with pt this morning to inform her that she can pick up her eliquis prescription at the [facility] at no cost. She verbalized understanding.  ?

## 2022-03-11 NOTE — Telephone Encounter (Signed)
Spoke with Stefanie Libel [phone]) who is a patient financial resource specialist to assist pt with help getting her eliquis cost reduced. She stated that pt's next refill date is May 23rd and prior pt had been approved for the grant and she just needs to pick it up at the [facility]  ? ?Spoke with [facility] and [facility] to make sure pt has the coupon in place and she can get her prescription filled with reduced cost. Both pharmacy stated that the coupon is in place and pt can pick it up for $0 cost.  ?

## 2022-03-12 ENCOUNTER — Telehealth: Payer: Self-pay | Admitting: Orthopaedic Surgery

## 2022-03-12 ENCOUNTER — Other Ambulatory Visit: Payer: Self-pay

## 2022-03-12 NOTE — Telephone Encounter (Signed)
Insulin Pen Needle (UNIFINE PENTIPS) 31G X 6 MM MISC, REFILL REQUEST @ [facility].

## 2022-03-12 NOTE — Telephone Encounter (Signed)
Called patient to discuss surgery date for left total knee.  Patient was scheduled in March but cancelled due to a diagnosis of endometrial cancer and having to have a hysterectomy.  She stated the doctors recently discovered a blood clot and she is on a blood thinner (eliquis) and cannot come off of it right now.  She will call when she gets the ok to have total knee surgery from her treating doctors.  Patient has my name and direct number for scheduling.

## 2022-03-12 NOTE — Telephone Encounter (Signed)
Ok thanks 

## 2022-03-13 ENCOUNTER — Other Ambulatory Visit (HOSPITAL_COMMUNITY): Payer: Self-pay

## 2022-03-13 MED ORDER — UNIFINE PENTIPS 31G X 6 MM MISC
100.0000 | 3 refills | Status: DC
  Filled 2022-03-13: qty 100, 100d supply, fill #0

## 2022-03-20 ENCOUNTER — Encounter: Payer: Self-pay | Admitting: Genetic Counselor

## 2022-03-20 ENCOUNTER — Ambulatory Visit: Payer: Self-pay | Admitting: Genetic Counselor

## 2022-03-20 ENCOUNTER — Telehealth: Payer: Self-pay | Admitting: Genetic Counselor

## 2022-03-20 DIAGNOSIS — Z8041 Family history of malignant neoplasm of ovary: Secondary | ICD-10-CM

## 2022-03-20 DIAGNOSIS — C541 Malignant neoplasm of endometrium: Secondary | ICD-10-CM

## 2022-03-20 DIAGNOSIS — Z1379 Encounter for other screening for genetic and chromosomal anomalies: Secondary | ICD-10-CM | POA: Insufficient documentation

## 2022-03-20 DIAGNOSIS — Z803 Family history of malignant neoplasm of breast: Secondary | ICD-10-CM

## 2022-03-20 NOTE — Progress Notes (Signed)
HPI:   Kristy [name] was previously seen in the [facility] due to a personal and family history of cancer and concerns regarding a hereditary predisposition to cancer. Please refer to our prior cancer genetics clinic note for more information regarding our discussion, assessment and recommendations, at the time. Kristy [name]'s recent genetic test results were disclosed to her, as were recommendations warranted by these results. These results and recommendations are discussed in more detail below.  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  Results of MSI and IHC MMR were not available.    FAMILY HISTORY:  We obtained a detailed, 4-generation family history.  Significant diagnoses are listed below:      Family History  Problem Relation Age of Onset   Ovarian cancer Mother          dx late 60s   Breast cancer Sister 62   Esophageal cancer Maternal Aunt          d. after age 50   Kidney cancer Paternal Aunt          dx after age 50   Breast cancer Other          maternal female cousin; dx 50s      Kristy [name] reported that her sister recently had negative genetic testing.  No report was available for review today. Kristy [name] is unaware of other previous family history of genetic testing for hereditary cancer risks. There is no reported Ashkenazi Jewish ancestry. There is no known consanguinity.    GENETIC TEST RESULTS:  The [facility] CancerNext-Expanded +RNAinsight Panel found no pathogenic mutations. The CancerNext-Expanded gene panel offered by [facility] and includes sequencing, rearrangement, and RNA analysis for the following 77 genes: AIP, ALK, APC, ATM, AXIN2, BAP1, BARD1, BLM, BMPR1A, BRCA1, BRCA2, BRIP1, CDC73, CDH1, CDK4, CDKN1B, CDKN2A, CHEK2, CTNNA1, DICER1, FANCC, FH, FLCN, GALNT12, KIF1B, LZTR1, MAX, MEN1, MET, MLH1, MSH2, MSH3, MSH6, MUTYH, NBN,  NF1, NF2, NTHL1, PALB2, PHOX2B, PMS2, POT1, PRKAR1A, PTCH1, PTEN, RAD51C, RAD51D, RB1, RECQL, RET, SDHA, SDHAF2, SDHB, SDHC, SDHD, SMAD4, SMARCA4, SMARCB1, SMARCE1, STK11, SUFU, TMEM127, TP53, TSC1, TSC2, VHL and XRCC2 (sequencing and deletion/duplication); EGFR, EGLN1, HOXB13, KIT, MITF, PDGFRA, POLD1, and POLE (sequencing only); EPCAM and GREM1 (deletion/duplication only).    The test report has been scanned into EPIC and is located under the Molecular Pathology section of the Results Review tab.  A portion of the result report is included below for reference. Genetic testing reported out on Mar 19, 2022.      Genetic testing identified a variant of uncertain significance (VUS) in the BRCA1 gene called c.1286T>C (p.I429T).  At this time, it is unknown if this variant is associated with an increased risk for cancer or if it is benign, but most uncertain variants are reclassified to benign. It should not be used to make medical management decisions. With time, we suspect the laboratory will determine the significance of this variant, if any. If the laboratory reclassifies this variant, we will attempt to contact Kristy [name] to discuss it further.   Even though a pathogenic variant was not identified, possible explanations for the cancer in the family may include: There may be no hereditary risk for cancer in the family. The cancers in Kristy [name] and/or her family may be sporadic/familial or due to other genetic and environmental factors. There may be a   gene mutation in one of these genes that current testing methods cannot detect but that chance is small. There could be another gene that has not yet been discovered, or that we have not yet tested, that is responsible for the cancer diagnoses in the family.  It is also possible there is a hereditary cause for the cancer in the family that Kristy [name] did not inherit. The variant of uncertain significance detected in the BRCA1 gene may be reclassified as a  pathogenic variant in the future. At this time, we do not know if this variant increases the risk for cancer.  Therefore, it is important to remain in touch with cancer genetics in the future so that we can continue to offer Kristy [name] the most up to date genetic testing.    ADDITIONAL GENETIC TESTING:  We discussed with Kristy [name] that her genetic testing was fairly extensive.  If there are additional relevant genes identified to increase cancer risk that can be analyzed in the future, we would be happy to discuss and coordinate this testing at that time.     CANCER SCREENING RECOMMENDATIONS:  Kristy [name]'s test result is considered negative (normal).  This means that we have not identified a hereditary cause for her personal history of cancer at this time.   An individual's cancer risk and medical management are not determined by genetic test results alone. Overall cancer risk assessment incorporates additional factors, including personal medical history, family history, and any available genetic information that may result in a personalized plan for cancer prevention and surveillance. Therefore, it is recommended she continue to follow the cancer management and screening guidelines provided by her oncology and primary healthcare provider.  RECOMMENDATIONS FOR FAMILY MEMBERS:   Since she did not inherit a identifiable mutation in a cancer predisposition gene included on this panel, her children could not have inherited a known mutation from her in one of these genes. Individuals in this family might be at some increased risk of developing cancer, over the general population risk, due to the family history of cancer.  Individuals in the family should notify their providers of the family history of cancer. We recommend women in this family have a yearly mammogram beginning at age 40, or 10 years younger than the earliest onset of cancer, an annual clinical breast exam, and perform monthly breast  self-exams.   We do not recommend familial testing for the BRCA1 variant of uncertain significance (VUS).  FOLLOW-UP:  Lastly, we discussed with Kristy [name] that cancer genetics is a rapidly advancing field and it is possible that new genetic tests will be appropriate for her and/or her family members in the future. We encouraged her to remain in contact with cancer genetics on an annual basis so we can update her personal and family histories and let her know of advances in cancer genetics that may benefit this family.   Our contact number was provided. Kristy [name]'s questions were answered to her satisfaction, and she knows she is welcome to call us at anytime with additional questions or concerns.   Cari M. Koerner, MS, LCGC Genetic Counselor [email or web address] (P) [phone]  

## 2022-03-20 NOTE — Telephone Encounter (Signed)
Revealed negative genetic testing and VUS in BRCA1.  Discussed that we do not know why she has a history of endometrial cancer or why there is cancer in the family. It could be sporadic/famillial, due to a change in a gene that she did not inherit, due to a different gene that we are not testing, or maybe our current technology may not be able to pick something up.  It will be important for her to keep in contact with genetics to keep up with whether additional testing may be needed.

## 2022-03-24 ENCOUNTER — Other Ambulatory Visit (HOSPITAL_COMMUNITY): Payer: Self-pay

## 2022-03-24 ENCOUNTER — Other Ambulatory Visit: Payer: Self-pay | Admitting: Internal Medicine

## 2022-03-24 DIAGNOSIS — I150 Renovascular hypertension: Secondary | ICD-10-CM

## 2022-03-26 ENCOUNTER — Other Ambulatory Visit (HOSPITAL_COMMUNITY): Payer: Self-pay

## 2022-03-30 ENCOUNTER — Other Ambulatory Visit (HOSPITAL_COMMUNITY): Payer: Self-pay

## 2022-03-30 MED ORDER — LOSARTAN POTASSIUM-HCTZ 100-25 MG PO TABS
1.0000 | ORAL_TABLET | Freq: Every day | ORAL | 0 refills | Status: DC
  Filled 2022-03-30: qty 90, 90d supply, fill #0

## 2022-03-31 ENCOUNTER — Other Ambulatory Visit (HOSPITAL_COMMUNITY): Payer: Self-pay

## 2022-04-09 ENCOUNTER — Telehealth: Payer: Self-pay | Admitting: *Deleted

## 2022-04-09 NOTE — Telephone Encounter (Signed)
Call from patient would like to know with her current condition with the "clots" will be she be able to take a 3 hour ride to the [place] with her family for a vacation.

## 2022-04-10 ENCOUNTER — Other Ambulatory Visit: Payer: Self-pay | Admitting: Internal Medicine

## 2022-04-13 ENCOUNTER — Other Ambulatory Visit (HOSPITAL_COMMUNITY): Payer: Self-pay

## 2022-04-13 MED ORDER — CARVEDILOL 12.5 MG PO TABS
12.5000 mg | ORAL_TABLET | Freq: Two times a day (BID) | ORAL | 5 refills | Status: DC
  Filled 2022-04-13: qty 120, 60d supply, fill #0

## 2022-04-13 NOTE — Telephone Encounter (Signed)
Thanks for the update.  I have called patient and discussed with her.

## 2022-04-16 ENCOUNTER — Other Ambulatory Visit (HOSPITAL_COMMUNITY): Payer: Self-pay

## 2022-04-16 ENCOUNTER — Other Ambulatory Visit: Payer: Self-pay | Admitting: Internal Medicine

## 2022-04-16 DIAGNOSIS — F1190 Opioid use, unspecified, uncomplicated: Secondary | ICD-10-CM

## 2022-04-16 MED ORDER — TRAMADOL HCL 50 MG PO TABS
100.0000 mg | ORAL_TABLET | Freq: Four times a day (QID) | ORAL | 0 refills | Status: DC | PRN
  Filled 2022-04-16: qty 75, 30d supply, fill #0

## 2022-04-18 ENCOUNTER — Encounter: Payer: Self-pay | Admitting: *Deleted

## 2022-05-01 ENCOUNTER — Other Ambulatory Visit: Payer: Self-pay | Admitting: Internal Medicine

## 2022-05-01 DIAGNOSIS — Z1231 Encounter for screening mammogram for malignant neoplasm of breast: Secondary | ICD-10-CM

## 2022-05-04 ENCOUNTER — Other Ambulatory Visit (HOSPITAL_COMMUNITY): Payer: Self-pay

## 2022-05-04 ENCOUNTER — Telehealth: Payer: Self-pay | Admitting: *Deleted

## 2022-05-04 ENCOUNTER — Other Ambulatory Visit: Payer: Self-pay | Admitting: Internal Medicine

## 2022-05-04 ENCOUNTER — Telehealth: Payer: Self-pay

## 2022-05-04 DIAGNOSIS — E1142 Type 2 diabetes mellitus with diabetic polyneuropathy: Secondary | ICD-10-CM

## 2022-05-04 NOTE — Telephone Encounter (Signed)
Told Ms Lamour that Zoila Shutter said that her PCP Dr. Christiana Fuchs was to manage her Eliquis after her hospital discharge.  Spoke with Lyman [place] RN at the internal medicine clinic about pt needing assistance for medication for refill and further prescriptions. Pt has a week supply left.(See note dated 05-04-22 by Donnald Garre). Pt verbalized understanding.

## 2022-05-04 NOTE — Telephone Encounter (Signed)
Received call from Barbaraann Share, [place] with Dr. Ruta Hinds office. States patient developed clot following GYN surgery. They have been managing her Rx for Eliquis through a Fund. Patient only has one week's worth left of Eliquis. There is only $120 left in fund and Rx is $600. They are requesting Eliquis be managed by PCP going forward. Will need new Rx or samples, and assistance with PAP.

## 2022-05-05 ENCOUNTER — Other Ambulatory Visit (HOSPITAL_COMMUNITY): Payer: Self-pay

## 2022-05-06 ENCOUNTER — Other Ambulatory Visit (INDEPENDENT_AMBULATORY_CARE_PROVIDER_SITE_OTHER): Payer: Medicare PPO | Admitting: Internal Medicine

## 2022-05-06 ENCOUNTER — Telehealth: Payer: Self-pay

## 2022-05-06 ENCOUNTER — Other Ambulatory Visit (HOSPITAL_COMMUNITY): Payer: Self-pay

## 2022-05-06 DIAGNOSIS — I82401 Acute embolism and thrombosis of unspecified deep veins of right lower extremity: Secondary | ICD-10-CM

## 2022-05-06 MED ORDER — APIXABAN 2.5 MG PO TABS
2.5000 mg | ORAL_TABLET | Freq: Two times a day (BID) | ORAL | 2 refills | Status: DC
  Filled 2022-05-06: qty 60, 30d supply, fill #0

## 2022-05-06 MED ORDER — XULTOPHY 100-3.6 UNIT-MG/ML ~~LOC~~ SOPN
40.0000 [IU] | PEN_INJECTOR | Freq: Every day | SUBCUTANEOUS | 5 refills | Status: DC
  Filled 2022-05-06: qty 12, 30d supply, fill #0
  Filled 2022-06-08: qty 12, 30d supply, fill #1
  Filled 2022-07-09: qty 12, 30d supply, fill #2

## 2022-05-06 NOTE — Telephone Encounter (Signed)
Xultophy refill sent to Kristy [name]. Patient needs follow-up for diabetes, please schedule her when able.

## 2022-05-06 NOTE — Telephone Encounter (Signed)
Ran test claim for patients Eliquis at [facility]. Copay was $150. PT had previously been picking up there and copay was covered by a fund ([place] [employer]).  Told pharmacy to place RX on hold being that pt wasn't going to be able to pickup medication due to cost.  Spoke with Ms Clyatt about cost and patient assistance options. She would like me to mail a BMS ([place]) application to her home for possible patient assistance. In the meantime she will reach out to [facility] and get a print out of how much shes spent on medication in total this year. This is needed for the BMS application. Informed pt we dont have any samples in the office at the moment  & she could see if the pharmacy could fill a less amount of pills for her in the meantime to pickup for cheaper. Pt expressed understanding.

## 2022-05-06 NOTE — Progress Notes (Signed)
Patient with history of endometrial carcinoma status post hysterectomy 2/23 and DVT and right femoral vein 3/23secondary to underlying cancer and recent surgery.  Eliquis was started at reduced dosing due to GFR per hematologist. -Refill of Eliquis 2.5 mg twice daily sent to [facility] -Patient to continue anticoagulant for 6 months, stop date in September.

## 2022-05-11 ENCOUNTER — Telehealth: Payer: Self-pay | Admitting: *Deleted

## 2022-05-11 NOTE — Telephone Encounter (Signed)
Spoke with pt's doctor Dr.Masters who wanted to clarify if pt needed 3 months vs. 6 months (as per Dr.Tucker's note) for anticoagulation treatment. She requested a call back from Dr.Tucker. Her phone number is [phone]. Dr.Tucker notified.

## 2022-05-12 ENCOUNTER — Telehealth (INDEPENDENT_AMBULATORY_CARE_PROVIDER_SITE_OTHER): Payer: Medicare PPO | Admitting: Internal Medicine

## 2022-05-12 NOTE — Telephone Encounter (Signed)
Looks like she is cleared to stop Eliquis and to have knee surgery

## 2022-05-12 NOTE — Telephone Encounter (Signed)
[name] is a 66 year old with past medical history of endometrial cancer status post curative hysterectomy complicated by DVT in March 2023.  She has no prior history of DVT.  She was started on apixaban 2.5 mg twice daily.  In the setting of her provoked DVT she required 3 months of anticoagulation that was completed in May.  I talked with Dr. Gaylyn Rong to clarify if patient needed to continue apixaban.  At this time patient can discontinue apixaban after receiving 3 months treatment for provoked DVT. A/P: Called and spoke with patient to let her know that she can discontinue apixaban and does not need to complete pharmaceutical paperwork to get medication. -Medication list updated -Dr. Berline Lopes, Dr. Dareen Piano, and Hortencia Pilar updated. -I will also update Dr. Erlinda Hong, who was waiting until patient completed anticoagulation therapy prior to doing knee replacement.

## 2022-05-13 ENCOUNTER — Ambulatory Visit
Admission: RE | Admit: 2022-05-13 | Discharge: 2022-05-13 | Disposition: A | Discharge status: Home / Self Care | Payer: Medicare PPO | Source: Ambulatory Visit | Attending: Internal Medicine | Admitting: Internal Medicine

## 2022-05-13 DIAGNOSIS — Z1231 Encounter for screening mammogram for malignant neoplasm of breast: Secondary | ICD-10-CM

## 2022-05-19 ENCOUNTER — Other Ambulatory Visit (HOSPITAL_COMMUNITY): Payer: Self-pay

## 2022-05-27 ENCOUNTER — Other Ambulatory Visit (HOSPITAL_COMMUNITY): Payer: Self-pay

## 2022-06-01 ENCOUNTER — Other Ambulatory Visit (HOSPITAL_COMMUNITY): Payer: Self-pay

## 2022-06-08 ENCOUNTER — Other Ambulatory Visit: Payer: Self-pay | Admitting: Student

## 2022-06-08 DIAGNOSIS — F1190 Opioid use, unspecified, uncomplicated: Secondary | ICD-10-CM

## 2022-06-09 ENCOUNTER — Other Ambulatory Visit (HOSPITAL_COMMUNITY): Payer: Self-pay

## 2022-06-09 MED ORDER — TRAMADOL HCL 50 MG PO TABS
100.0000 mg | ORAL_TABLET | Freq: Four times a day (QID) | ORAL | 0 refills | Status: DC | PRN
  Filled 2022-06-09: qty 75, 30d supply, fill #0

## 2022-06-22 ENCOUNTER — Other Ambulatory Visit: Payer: Self-pay

## 2022-06-22 ENCOUNTER — Ambulatory Visit (INDEPENDENT_AMBULATORY_CARE_PROVIDER_SITE_OTHER): Payer: Medicare PPO | Admitting: Internal Medicine

## 2022-06-22 VITALS — BP 148/105 | HR 93 | Temp 98.0°F | Ht 62.0 in | Wt 219.0 lb

## 2022-06-22 DIAGNOSIS — Z87891 Personal history of nicotine dependence: Secondary | ICD-10-CM

## 2022-06-22 DIAGNOSIS — F321 Major depressive disorder, single episode, moderate: Secondary | ICD-10-CM | POA: Diagnosis not present

## 2022-06-22 DIAGNOSIS — N1832 Chronic kidney disease, stage 3b: Secondary | ICD-10-CM

## 2022-06-22 DIAGNOSIS — N2889 Other specified disorders of kidney and ureter: Secondary | ICD-10-CM | POA: Diagnosis not present

## 2022-06-22 DIAGNOSIS — E1142 Type 2 diabetes mellitus with diabetic polyneuropathy: Secondary | ICD-10-CM | POA: Diagnosis not present

## 2022-06-22 DIAGNOSIS — Z794 Long term (current) use of insulin: Secondary | ICD-10-CM

## 2022-06-22 DIAGNOSIS — I150 Renovascular hypertension: Secondary | ICD-10-CM | POA: Diagnosis not present

## 2022-06-22 DIAGNOSIS — M1712 Unilateral primary osteoarthritis, left knee: Secondary | ICD-10-CM

## 2022-06-22 DIAGNOSIS — E1122 Type 2 diabetes mellitus with diabetic chronic kidney disease: Secondary | ICD-10-CM | POA: Diagnosis not present

## 2022-06-22 LAB — POCT GLYCOSYLATED HEMOGLOBIN (HGB A1C): Hemoglobin A1C: 9.7 % — AB (ref 4.0–5.6)

## 2022-06-22 LAB — GLUCOSE, CAPILLARY: Glucose-Capillary: 216 mg/dL — ABNORMAL HIGH (ref 70–99)

## 2022-06-22 NOTE — Patient Instructions (Signed)
Thank you, [place] for allowing [place] to provide your care today. Today we discussed:  Depression Please call [facility] to set up counseling. They do have options that are in person. Their # is [place] [phone].  Diabetes Your blood work is worse for diabetes. I am concerned that your depression is making it harder to manage diabetes. You have had a hard spring! Please take insulin daily and check blood sugar once every morning. Bring that log in when you come back in a few weeks.  Blood pressure Blood pressure is a bit high today. I am going to touch base with your kidney doctor.  Knee surgery I will message Dr. Erlinda Hong about this.  I have ordered the following labs for you:  Lab Orders         Glucose, capillary         BMP8+Anion Gap         POC Hbg A1C       Referrals ordered today:   Referral Orders  No referral(s) requested today     I have ordered the following medication/changed the following medications:   Stop the following medications: There are no discontinued medications.   Start the following medications: No orders of the defined types were placed in this encounter.    Follow up:  1 month    We look forward to seeing you next time. Please call our clinic at [phone] if you have any questions or concerns. The best time to call is Monday-Friday from 9am-4pm, but there is someone available 24/7. If after hours or the weekend, call the main hospital number and ask for the Internal Medicine Resident On-Call. If you need medication refills, please notify your pharmacy one week in advance and they will send [place] a request.   Thank you for trusting me with your care. Wishing you the best!   Christiana Fuchs, [place]

## 2022-06-23 ENCOUNTER — Other Ambulatory Visit (HOSPITAL_COMMUNITY): Payer: Self-pay

## 2022-06-23 DIAGNOSIS — N2889 Other specified disorders of kidney and ureter: Secondary | ICD-10-CM | POA: Insufficient documentation

## 2022-06-23 LAB — BMP8+ANION GAP
Anion Gap: 17 mmol/L (ref 10.0–18.0)
BUN/Creatinine Ratio: 15 (ref 12–28)
BUN: 36 mg/dL — ABNORMAL HIGH (ref 8–27)
CO2: 20 mmol/L (ref 20–29)
Calcium: 10.7 mg/dL — ABNORMAL HIGH (ref 8.7–10.3)
Chloride: 103 mmol/L (ref 96–106)
Creatinine, Ser: 2.34 mg/dL — ABNORMAL HIGH (ref 0.57–1.00)
Glucose: 162 mg/dL — ABNORMAL HIGH (ref 70–99)
Potassium: 4.5 mmol/L (ref 3.5–5.2)
Sodium: 140 mmol/L (ref 134–144)
eGFR: 22 mL/min/{1.73_m2} — ABNORMAL LOW (ref >59)

## 2022-06-23 MED ORDER — CARVEDILOL 25 MG PO TABS
25.0000 mg | ORAL_TABLET | Freq: Two times a day (BID) | ORAL | 11 refills | Status: DC
  Filled 2022-06-23 – 2022-07-06 (×2): qty 60, 30d supply, fill #0
  Filled 2022-09-03: qty 60, 30d supply, fill #1
  Filled 2022-09-04: qty 60, 30d supply, fill #0
  Filled 2022-11-02: qty 60, 30d supply, fill #1
  Filled 2023-01-01: qty 60, 30d supply, fill #2

## 2022-06-23 NOTE — Assessment & Plan Note (Signed)
Patient with history of left renal mass from Renal ultrasound and MRI 5/23. MRI showed exophytic left upper renal lesion 3.5x3.2 cm. Thinking that this is likely benign but recommendation to follow with imaging at 6 months (11/23). A/P: Repeat MRI 11/23

## 2022-06-23 NOTE — Assessment & Plan Note (Addendum)
Patient presents with elevated blood pressure. Initially elevated to 177/94 and improved to 148/105 on repeat. Her blood pressure medications are managed by her nephrologist, Dr. Moshe Cipro. Current medications include:  -Losartan-HCTZ 100-25 mg -Carvedilol 12.5 mg BID  Last BMP in 4/23 with K elevated to 5.4 and calcium of 10.6. A/P: Repeat BMP improved K from 5.4 to 4.8. Creatinine remains stable at 2.3 with GFR 22. Her renal function has slowly been declining over the last few years. Blood pressure remains above goal and will increase carvedilol to 25 mg BID. Continue losartan-HCTZ Message Dr. Louie Boston to help with coordination of care.

## 2022-06-23 NOTE — Assessment & Plan Note (Signed)
Ca mildly elevated at 10.7.This could be 2/2 to HCTZ.      Latest Ref Rng & Units 06/22/2022    3:06 PM 01/27/2022   10:04 AM 01/16/2022    4:05 AM  BMP  Glucose 70 - 99 mg/dL 162  150  152   BUN 8 - 27 mg/dL 36  41  44   Creatinine 0.57 - 1.00 mg/dL 2.34  2.23  2.25   BUN/Creat Ratio 12 - '28 15  18    '$ Sodium 134 - 144 mmol/L 140  140  137   Potassium 3.5 - 5.2 mmol/L 4.5  5.4  4.2   Chloride 96 - 106 mmol/L 103  105  109   CO2 20 - 29 mmol/L '20  21  23   '$ Calcium 8.7 - 10.3 mg/dL 10.7  10.6  9.3     -repeat CMP at follow-up in 4 weeks

## 2022-06-23 NOTE — Assessment & Plan Note (Signed)
Patient with type 2 diabetes. Hgba1c worsened from 7.5 to 9.7. Current medications include degludec-liraglutide 40 units daily and atorvastatin 40 mg. She states that she has been taking the injections most days of the week but occasionally missing doses. She does not check her blood sugar, but previously had a CGM which she found helpful. She denies symptoms of hypoglycemia.  A/P: Without knowing current glucose trends, I do not want to make changes to her insulin regimen at this time. I think that a large part of the worsening A1c is from depression that has worsened with undergoing hysterectomy for endometrial cancer and then knee surgery being delayed leading to worsening function. I talked with her about importance of checking blood sugars once daily and bring glucometer in so that we could review trends together at next office visit. -follow-up in 4 weeks with glucometer -consider CGM at follow-up

## 2022-06-23 NOTE — Progress Notes (Signed)
Subjective:  CC: diabetes, hypertension, depression  HPI:  [name] is a 66 y.o. female with a past medical history stated below and presents today for follow-up on diabetes, hypertension and depression. She has had a challenging 6 months including being diagnosed with endometrial cancer, undergoing hysterectomy that was complicated by DVT which then delayed knee replacement for left osteoarthritis. Please see problem based assessment and plan for additional details.  Past Medical History:  Diagnosis Date   Arthritis    Chronic kidney disease    Colon polyps    Colon polyps    Diabetes mellitus    Diverticulosis    Endometrial cancer (HCC)    Family history of adverse reaction to anesthesia    Heart attack during anesthesia   Family history of breast cancer 02/25/2022   Family history of malignant neoplasm of ovary 02/25/2022   Hyperlipidemia    Hypertension    Pneumonia due to COVID-19 virus 11/24/2020   Renal artery stenosis (HCC) 2007    status post selective bilateral renal angiography, balloon angiopathy of the left renal artery, first diagnosed in 2007 based on MRA of the renal arteries which 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 Samule Ohm   Sleep apnea 2007    status post polysomnogram 2007 , suggested use of CPAP   Ventricular hypertrophy 2006    a 2-D echo in 2006, ejection fraction 55%, mild tricuspid regurgitation noted as well, 2-D echo November 13, 2008 showed diastolic dysfunction with LVEF normal of 65%    Current Outpatient Medications on File Prior to Visit  Medication Sig Dispense Refill   Accu-Chek FastClix Lancets MISC Check blood sugar two times a day (Patient taking differently: 1 each by Other route in the morning and at bedtime.) 102 each 5   Ascorbic Acid (VITAMIN C) 1000 MG tablet Take 1,000 mg by mouth daily.     aspirin 81 MG EC tablet Take 81 mg by mouth daily.     atorvastatin (LIPITOR) 40 MG tablet Take 1 tablet (40 mg  total) by mouth daily. 90 tablet 3   Black Elderberry (SAMBUCUS ELDERBERRY PO) Take 1 tablet by mouth daily.     Blood Glucose Monitoring Suppl (ACCU-CHEK GUIDE) w/Device KIT 1 each by Does not apply route 2 (two) times daily. 1 kit 1   glucose 4 GM chewable tablet Chew 1 tablet by mouth as needed for low blood sugar.     glucose blood (ACCU-CHEK GUIDE) test strip Check blood sugar two times a day (Patient taking differently: 1 each by Other route in the morning and at bedtime.) 100 each 5   Insulin Degludec-Liraglutide (XULTOPHY) 100-3.6 UNIT-MG/ML SOPN Inject 40 Units into the skin daily. 15 mL 5   Insulin Pen Needle (UNIFINE PENTIPS) 31G X 6 MM MISC Use as directed 100 each 3   losartan-hydrochlorothiazide (HYZAAR) 100-25 MG tablet Take 1 tablet by mouth daily. 90 tablet 0   Multiple Vitamin (MULTIVITAMIN) tablet Take 1 tablet by mouth daily.     Probiotic Product (PROBIOTIC DAILY) CAPS Take 1 tablet by mouth daily at 12 noon.     senna-docusate (SENOKOT-S) 8.6-50 MG tablet Take 2 tablets by mouth at bedtime. For AFTER surgery, do not take if having diarrhea 30 tablet 0   traMADol (ULTRAM) 50 MG tablet Take 2 tablets (100 mg total) by mouth every 6 (six) hours as needed for severe pain (must last 30 days) 75 tablet 0   No current facility-administered medications on  file prior to visit.    Family History  Problem Relation Age of Onset   Diabetes Mother    Cancer Mother    Ovarian cancer Mother        dx late 42s   Diabetes Father    Colon polyps Sister    Hypertension Sister    Breast cancer Sister 84   Colon polyps Sister    Hypertension Sister    Esophageal cancer Maternal Aunt        d. after age 65   Kidney cancer Paternal Aunt        dx after age 59   Hypertension Daughter    Stroke Daughter    Hypertension Daughter    Hypertension Daughter    Hypertension Daughter    Bipolar disorder Son    Alcohol abuse Son    Drug abuse Son    Breast cancer Cousin    Breast cancer  Other        maternal female cousin; dx 54s   Colon cancer Neg Hx    Stomach cancer Neg Hx    Rectal cancer Neg Hx     Social History   Socioeconomic History   Marital status: Married    Spouse name: Not on file   Number of children: Not on file   Years of education: 11   Highest education level: Not on file  Occupational History   Not on file  Tobacco Use   Smoking status: Former    Packs/day: 1.00    Years: 25.00    Total pack years: 25.00    Types: Cigarettes    Quit date: 10/26/2005    Years since quitting: 16.6   Smokeless tobacco: Never  Vaping Use   Vaping Use: Never used  Substance and Sexual Activity   Alcohol use: No    Alcohol/week: 0.0 standard drinks of alcohol   Drug use: No   Sexual activity: Not Currently    Partners: Male    Birth control/protection: Abstinence  Other Topics Concern   Not on file  Social History Narrative   Current Social History 11/14/2020        Patient lives with spouse in a home which is 1 story. There are steps up to the entrance with a handrail which the patient uses.       Patient's method of transportation is personal car.      The highest level of education was some high school, 11th grade.      The patient currently disabled.      Identified important Relationships are "my husband"       Pets : a dog, Armed forces operational officer / Fun: "Nothing since Covid"       Current Stressors: None       Religious / Personal Beliefs: Non-Christian       Other: None    Social Determinants of Health   Financial Resource Strain: Not on file  Food Insecurity: Not on file  Transportation Needs: Not on file  Physical Activity: Not on file  Stress: Not on file  Social Connections: Not on file  Intimate Partner Violence: Not on file    Review of Systems: ROS negative except for what is noted on the assessment and plan.  Objective:   Vitals:   06/22/22 1402 06/22/22 1445  BP: (!) 177/94 (!) 148/105  Pulse: (!) 101 93  Temp:  98 F (36.7 C)   TempSrc: Oral  SpO2: 99%   Weight: 219 lb (99.3 kg)   Height: $Remove'5\' 2"'euWBdcM$  (1.575 m)     Physical Exam: Constitutional: well-appearing, elderly female, presented in wheel chair Cardiovascular: regular rate and rhythm, no m/r/g Pulmonary/Chest: normal work of breathing on room air, lungs clear to auscultation bilaterally Abdominal: soft, non-tender, non-distended MSK: normal bulk and tone Neurological: alert & oriented x 3 Skin: warm and dry     Assessment & Plan:  Primary osteoarthritis of left knee Patient with history of osteoarthritis. She follows with Dr. Erlinda Hong and was recommended to have left knee replacement however she developed DVT following surgery with endometrial cancer and needed to complete dosing of DOAC prior to surgery. DVT was provoked from surgery and resection was curative.  A/P: I will message Dr. Erlinda Hong in regards to scheduling knee replacement for her. She has completed appropriate duration for provoked DVT and DOAC was discontinued in 7/23.  Moderate major depression ([place]) Patient presents with history of MDD. PHQ-9 elevated at 15. Which is improved from prior  In 2019. She states that she feels depressed because of the pain in her left knee limiting her activity. We talked about trying [facility] for counseling and she is open to idea. A/P: Information for [facility] given  Left kidney mass Patient with history of left renal mass from Renal ultrasound and MRI 5/23. MRI showed exophytic left upper renal lesion 3.5x3.2 cm. Thinking that this is likely benign but recommendation to follow with imaging at 6 months (11/23). 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 Initially elevated to 177/94 and improved to 148/105 on repeat. Her blood pressure medications are managed by her nephrologist, Dr. Moshe Cipro. Current medications include:  -Losartan-HCTZ  100-25 mg -Carvedilol 12.5 mg BID  Last BMP in 4/23 with K elevated to 5.4 and calcium of 10.6. A/P: Repeat BMP improved K from 5.4 to 4.8. Creatinine remains stable at 2.3 with GFR 22. Her renal function has slowly been declining over the last few years. Blood pressure remains above goal and will increase carvedilol to 25 mg BID. Continue losartan-HCTZ Message Dr. Louie Boston to help with coordination of care.   DM type 2 with diabetic peripheral neuropathy [facility]) Patient with type 2 diabetes. Hgba1c worsened from 7.5 to 9.7. Current medications include degludec-liraglutide 40 units daily and atorvastatin 40 mg. She states that she has been taking the injections most days of the week but occasionally missing doses. She does not check her blood sugar, but previously had a CGM which she found helpful. She denies symptoms of hypoglycemia.  A/P: Without knowing current glucose trends, I do not want to make changes to her insulin regimen at this time. I think that a large part of the worsening A1c is from depression that has worsened with undergoing hysterectomy for endometrial cancer and then knee surgery being delayed leading to worsening function. I talked with her about importance of checking blood sugars once daily and bring glucometer in so that we could review trends together at next office visit. -follow-up in 4 weeks with glucometer -consider CGM at follow-up   Hypercalcemia Ca mildly elevated at 10.7.This could be 2/2 to HCTZ.      Latest Ref Rng & Units 06/22/2022    3:06 PM 01/27/2022   10:04 AM 01/16/2022    4:05 AM  BMP  Glucose 70 - 99 mg/dL 162  150  152   BUN 8 - 27 mg/dL 36  41  44   Creatinine 0.57 - 1.00 mg/dL 2.34  2.23  2.25   BUN/Creat Ratio 12 - $Re'28 15  18    'Etl$ Sodium 134 - 144 mmol/L 140  140  137   Potassium 3.5 - 5.2 mmol/L 4.5  5.4  4.2   Chloride 96 - 106 mmol/L 103  105  109   CO2 20 - 29 mmol/L $RemoveB'20  21  23   'mkCHBHnp$ Calcium 8.7 - 10.3 mg/dL 10.7  10.6  9.3     -repeat  CMP at follow-up in 4 weeks   Patient discussed with Dr. Hansel Starling Meighan Treto, D.O. [facility]  PGY-2 Pager: [phone]  Phone: [phone] Date 06/23/2022  Time 6:05 PM

## 2022-06-23 NOTE — Assessment & Plan Note (Signed)
Patient presents with history of MDD. PHQ-9 elevated at 15. Which is improved from prior  In 2019. She states that she feels depressed because of the pain in her left knee limiting her activity. We talked about trying [facility] for counseling and she is open to idea. A/P: Information for [facility] given

## 2022-06-23 NOTE — Assessment & Plan Note (Addendum)
Patient with history of osteoarthritis. She follows with Dr. Erlinda Hong and was recommended to have left knee replacement however she developed DVT following surgery with endometrial cancer and needed to complete dosing of DOAC prior to surgery. DVT was provoked from surgery and resection was curative.  A/P: I will message Dr. Erlinda Hong in regards to scheduling knee replacement for her. She has completed appropriate duration for provoked DVT and DOAC was discontinued in 7/23.

## 2022-06-24 NOTE — Progress Notes (Signed)
Internal Medicine Clinic Attending  Case discussed with Dr. Masters  At the time of the visit.  We reviewed the resident's history and exam and pertinent patient test results.  I agree with the assessment, diagnosis, and plan of care documented in the resident's note.  

## 2022-07-06 ENCOUNTER — Other Ambulatory Visit (HOSPITAL_COMMUNITY): Payer: Self-pay

## 2022-07-06 LAB — HM DIABETES EYE EXAM

## 2022-07-08 ENCOUNTER — Other Ambulatory Visit (HOSPITAL_COMMUNITY): Payer: Self-pay

## 2022-07-09 ENCOUNTER — Other Ambulatory Visit (HOSPITAL_COMMUNITY): Payer: Self-pay

## 2022-07-13 ENCOUNTER — Other Ambulatory Visit: Payer: Self-pay | Admitting: Internal Medicine

## 2022-07-13 ENCOUNTER — Other Ambulatory Visit (HOSPITAL_COMMUNITY): Payer: Self-pay

## 2022-07-13 DIAGNOSIS — I150 Renovascular hypertension: Secondary | ICD-10-CM

## 2022-07-13 MED ORDER — LOSARTAN POTASSIUM-HCTZ 100-25 MG PO TABS
1.0000 | ORAL_TABLET | Freq: Every day | ORAL | 0 refills | Status: DC
  Filled 2022-07-13 – 2022-07-23 (×2): qty 90, 90d supply, fill #0

## 2022-07-13 NOTE — Telephone Encounter (Signed)
Next appt scheduled 10/11 with Dr Lisabeth Devoid.

## 2022-07-13 NOTE — Telephone Encounter (Signed)
Follow-up with Dr. Lisabeth Devoid scheduled for 10/23

## 2022-07-21 ENCOUNTER — Other Ambulatory Visit (HOSPITAL_COMMUNITY): Payer: Self-pay

## 2022-07-22 ENCOUNTER — Encounter: Payer: Medicare PPO | Admitting: Student

## 2022-07-23 ENCOUNTER — Other Ambulatory Visit (HOSPITAL_COMMUNITY): Payer: Self-pay

## 2022-07-24 ENCOUNTER — Other Ambulatory Visit (HOSPITAL_COMMUNITY): Payer: Self-pay

## 2022-08-03 ENCOUNTER — Encounter: Payer: Self-pay | Admitting: Gynecologic Oncology

## 2022-08-04 ENCOUNTER — Inpatient Hospital Stay: Payer: Medicare PPO | Attending: Gynecologic Oncology | Admitting: Gynecologic Oncology

## 2022-08-04 ENCOUNTER — Encounter: Payer: Self-pay | Admitting: Gynecologic Oncology

## 2022-08-04 VITALS — BP 160/98 | HR 90 | Temp 98.3°F | Resp 16 | Ht 62.0 in | Wt 217.0 lb

## 2022-08-04 DIAGNOSIS — R030 Elevated blood-pressure reading, without diagnosis of hypertension: Secondary | ICD-10-CM | POA: Diagnosis not present

## 2022-08-04 DIAGNOSIS — Z90710 Acquired absence of both cervix and uterus: Secondary | ICD-10-CM | POA: Insufficient documentation

## 2022-08-04 DIAGNOSIS — Z8542 Personal history of malignant neoplasm of other parts of uterus: Secondary | ICD-10-CM | POA: Diagnosis not present

## 2022-08-04 DIAGNOSIS — Z90722 Acquired absence of ovaries, bilateral: Secondary | ICD-10-CM | POA: Insufficient documentation

## 2022-08-04 DIAGNOSIS — C541 Malignant neoplasm of endometrium: Secondary | ICD-10-CM

## 2022-08-04 DIAGNOSIS — N871 Moderate cervical dysplasia: Secondary | ICD-10-CM | POA: Diagnosis not present

## 2022-08-04 DIAGNOSIS — N281 Cyst of kidney, acquired: Secondary | ICD-10-CM

## 2022-08-04 DIAGNOSIS — N2889 Other specified disorders of kidney and ureter: Secondary | ICD-10-CM

## 2022-08-04 NOTE — Progress Notes (Signed)
Gynecologic Oncology Return Clinic Visit  08/04/2022  Reason for Visit: Surveillance visit in the setting of endometrial cancer  Treatment History: She denies any history of abnormal Pap smears until recently.  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 The cervix was cauterized beyond the edge of the leep biopsy. When she was seen for her follow-up pap test on 1/18. Patient underwent colposcopy. Cervix was noted to be very stenotic.  No acetowhite changes were noted after application of acetic acid.  After application of Lugol's, as there was decreased uptake at 6:00, biopsy was performed. Because of her stenotic cervix, a scalpel was used to open the os with drainage of a large amount of brown mucus. ECC was then performed.  Pelvic ultrasound exam was also performed given these findings and showed uterus measures 9 x 3.8 x 5.6 cm with an endometrial lining of 2.4 cm.  Masses seen within the endometrium as well as a small amount of fluid.  Cervix is dilated and filled with fluid.     Low-grade dysplasia was noted from the 6:00 cervical biopsy.  Endocervix biopsy showed minute detached fragments of atypical glandular epithelium, favored to be endometrial within a necroinflammatory diathesis.  Minute detached fragment of dysplastic metaplastic squamous epithelium, at least late grade also noted.  Endometrial biopsy showed scant detached atypical glandular epithelium, favored to be endometrial within a background of focal atypical hyperplasia.  Changes focally suggestive of an endometrial polyp.  Comment is that both the endocervical and endometrial sampling showed detached fragments of highly atypical glandular epithelium.  Additional sampling is recommended.   On 12/23/2021, patient underwent hysteroscopy, D&C under hysteroscopic guidance, ECC, cold knife cone, and Mirena IUD placement.  Findings at the time of surgery were a normal and  somewhat atrophic cervix.  Multiple fibroids and polypoid appearing structures within the endometrium, some of which appeared nodular with calcifications.  Final pathology revealed high-grade endometrial carcinoma, FIGO grade 3, with clear cell and high-grade serous component.  Focal sarcomatous component cannot be ruled out.  ECC showed blood and fibrin.  Cervical conization showed low-grade dysplasia.   Given high-grade pathology, recommendation was made to defer patient's scheduled orthopedic surgery.  On 3/21, she underwent robotic assisted TLH with BSO, bilateral sentinel lymph node biopsy, omentectomy, lysis of adhesion, mini lap, and periurethral laceration repair.  Findings at the time of surgery included an enlarged uterus.  Umbilical hernia noted.  No obvious omental or intra-abdominal/pelvic disease.  Mild pelvic adhesive disease.  3 cm paratubal cyst on the right with torsed right ovary.  No adenopathy.   Patient's postoperative course was complicated by development of mild tachycardia on postoperative day #2.  Work-up for this found a DVT of the right common femoral vein, right femoral vein, and right popliteal vein of indeterminate age.  Given patient's inability to get IV contrast in the setting of her kidney disease as well as postoperative atelectasis, VQ scan was deferred.  Patient was started on therapeutic anticoagulation with a heparin drip for 24 hours.  She was then transition to Eliquis at a decreased dose per hematologist.  Interval History: Doing well.  Denies any abdominal or pelvic pain.  Had some constipation recently, improved after increasing fiber and taking a stool softener.  Denies any vaginal bleeding or discharge.  Reports baseline bladder function.  Is hoping to schedule an appointment for her knee next month.  Eliquis was stopped last month.  Has been working on moving around more in and out of the house.  Past Medical/Surgical History: Past Medical History:   Diagnosis Date   Arthritis    Chronic kidney disease    Colon polyps    Colon polyps    Diabetes mellitus    Diverticulosis    Endometrial cancer ([place])    Family history of adverse reaction to anesthesia    Heart attack during anesthesia   Family history of breast cancer 02/25/2022   Family history of malignant neoplasm of ovary 02/25/2022   Hyperlipidemia    Hypertension    Pneumonia due to COVID-19 virus 11/24/2020   Renal artery stenosis ([place]) 2007    status post selective bilateral renal angiography, balloon angiopathy of the left renal artery, first diagnosed in 2007 based on MRA of the renal arteries which 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 Albertine Patricia   Sleep apnea 2007    status post polysomnogram 2007 , suggested use of CPAP   Ventricular hypertrophy 2006    a 2-D echo in 2006, ejection fraction 55%, mild tricuspid regurgitation noted as well, 2-D echo November 14, 2723 showed diastolic dysfunction with LVEF normal of 65%    Past Surgical History:  Procedure Laterality Date   BREAST BIOPSY Right 2016   CERVICAL CONIZATION W/BX N/A 12/23/2021   Procedure: CONIZATION CERVIX WITH BIOPSY;  Surgeon: Lafonda Mosses, MD;  Location: [facility];  Service: Gynecology;  Laterality: N/A;   COLONOSCOPY  02/03/2010   DILATATION & CURETTAGE/HYSTEROSCOPY WITH MYOSURE N/A 12/23/2021   Procedure: DILATATION & CURETTAGE/HYSTEROSCOPY WITH MYOSURE;  Surgeon: Lafonda Mosses, MD;  Location: [facility];  Service: Gynecology;  Laterality: N/A;   DILATION AND CURETTAGE OF UTERUS N/A 12/23/2021   Procedure: ENDOCERVICAL CURETTAGE;  Surgeon: Lafonda Mosses, MD;  Location: [facility];  Service: Gynecology;  Laterality: N/A;   INTRAUTERINE DEVICE (IUD) INSERTION N/A 12/23/2021   Procedure: INTRAUTERINE DEVICE (IUD) INSERTION MIRENA;  Surgeon: Lafonda Mosses, MD;  Location: [facility];  Service: Gynecology;  Laterality: N/A;   LYMPH NODE DISSECTION N/A 01/13/2022   Procedure: LYMPH NODE  DISSECTION;  Surgeon: Lafonda Mosses, MD;  Location: [facility];  Service: Gynecology;  Laterality: N/A;   RENAL ARTERY STENT  2007    patient is status post renal artery stent placement May 2007 and that was done secondary to renal artery stenosis, fibromuscular dysplasia   REPLACEMENT TOTAL KNEE  02/2011-Dr. Ninfa Linden   S/P Right Total Knee Replacement May/2012   ROBOTIC ASSISTED TOTAL HYSTERECTOMY WITH BILATERAL SALPINGO OOPHERECTOMY Bilateral 01/13/2022   Procedure: XI ROBOTIC ASSISTED TOTAL HYSTERECTOMY WITH BILATERAL SALPINGO OOPHORECTOMY mini laparotomy  OMENTECTOMY;  Surgeon: Lafonda Mosses, MD;  Location: [facility];  Service: Gynecology;  Laterality: Bilateral;   SENTINEL NODE BIOPSY Bilateral 01/13/2022   Procedure: SENTINEL NODE BIOPSY;  Surgeon: Lafonda Mosses, MD;  Location: [facility];  Service: Gynecology;  Laterality: Bilateral;   TUBAL LIGATION      Family History  Problem Relation Age of Onset   Diabetes Mother    Cancer Mother    Ovarian cancer Mother        dx late 38s   Diabetes Father    Colon polyps Sister    Hypertension Sister    Breast cancer Sister 44   Colon polyps Sister    Hypertension Sister    Esophageal cancer Maternal Aunt        d. after age 29   Kidney cancer Paternal 63  dx after age 58   Hypertension Daughter    Stroke Daughter    Hypertension Daughter    Hypertension Daughter    Hypertension Daughter    Bipolar disorder Son    Alcohol abuse Son    Drug abuse Son    Breast cancer Cousin    Breast cancer Other        maternal female cousin; dx 59s   Colon cancer Neg Hx    Stomach cancer Neg Hx    Rectal cancer Neg Hx     Social History   Socioeconomic History   Marital status: Married    Spouse name: Not on file   Number of children: Not on file   Years of education: 11   Highest education level: Not on file  Occupational History   Not on file  Tobacco Use   Smoking status: Former    Packs/day: 1.00    Years: 25.00     Total pack years: 25.00    Types: Cigarettes    Quit date: 10/26/2005    Years since quitting: 16.7   Smokeless tobacco: Never  Vaping Use   Vaping Use: Never used  Substance and Sexual Activity   Alcohol use: No    Alcohol/week: 0.0 standard drinks of alcohol   Drug use: No   Sexual activity: Not Currently    Partners: Male    Birth control/protection: Abstinence  Other Topics Concern   Not on file  Social History Narrative   Current Social History 11/14/2020        Patient lives with spouse in a home which is 1 story. There are steps up to the entrance with a handrail which the patient uses.       Patient's method of transportation is personal car.      The highest level of education was some high school, 11th grade.      The patient currently disabled.      Identified important Relationships are "my husband"       Pets : a dog, Research scientist (medical) / Fun: "Nothing since Covid"       Current Stressors: None       Religious / Personal Beliefs: Non-Christian       Other: None    Social Determinants of Radio broadcast assistant Strain: Not on file  Food Insecurity: Not on file  Transportation Needs: Not on file  Physical Activity: Not on file  Stress: Not on file  Social Connections: Not on file    Current Medications:  Current Outpatient Medications:    Accu-Chek FastClix Lancets MISC, Check blood sugar two times a day (Patient taking differently: 1 each by Other route in the morning and at bedtime.), Disp: 102 each, Rfl: 5   Ascorbic Acid (VITAMIN C) 1000 MG tablet, Take 1,000 mg by mouth daily., Disp: , Rfl:    aspirin 81 MG EC tablet, Take 81 mg by mouth daily., Disp: , Rfl:    atorvastatin (LIPITOR) 40 MG tablet, Take 1 tablet (40 mg total) by mouth daily., Disp: 90 tablet, Rfl: 3   Black Elderberry (SAMBUCUS ELDERBERRY PO), Take 1 tablet by mouth daily., Disp: , Rfl:    Blood Glucose Monitoring Suppl (ACCU-CHEK GUIDE) w/Device KIT, 1 each by Does not  apply route 2 (two) times daily., Disp: 1 kit, Rfl: 1   carvedilol (COREG) 25 MG tablet, Take 1 tablet by mouth 2 times daily., Disp: 60 tablet, Rfl: 11  glucose 4 GM chewable tablet, Chew 1 tablet by mouth as needed for low blood sugar., Disp: , Rfl:    glucose blood (ACCU-CHEK GUIDE) test strip, Check blood sugar two times a day (Patient taking differently: 1 each by Other route in the morning and at bedtime.), Disp: 100 each, Rfl: 5   Insulin Degludec-Liraglutide (XULTOPHY) 100-3.6 UNIT-MG/ML SOPN, Inject 40 Units into the skin daily., Disp: 15 mL, Rfl: 5   Insulin Pen Needle (UNIFINE PENTIPS) 31G X 6 MM MISC, Use as directed, Disp: 100 each, Rfl: 3   losartan-hydrochlorothiazide (HYZAAR) 100-25 MG tablet, Take 1 tablet by mouth daily., Disp: 90 tablet, Rfl: 0   Multiple Vitamin (MULTIVITAMIN) tablet, Take 1 tablet by mouth daily., Disp: , Rfl:    Probiotic Product (PROBIOTIC DAILY) CAPS, Take 1 tablet by mouth daily at 12 noon., Disp: , Rfl:    traMADol (ULTRAM) 50 MG tablet, Take 2 tablets (100 mg total) by mouth every 6 (six) hours as needed for severe pain (must last 30 days), Disp: 75 tablet, Rfl: 0  Review of Systems: + constipation Denies appetite changes, fevers, chills, fatigue, unexplained weight changes. Denies hearing loss, neck lumps or masses, mouth sores, ringing in ears or voice changes. Denies cough or wheezing.  Denies shortness of breath. Denies chest pain or palpitations. Denies leg swelling. Denies abdominal distention, pain, blood in stools, diarrhea, nausea, vomiting, or early satiety. Denies pain with intercourse, dysuria, frequency, hematuria or incontinence. Denies hot flashes, pelvic pain, vaginal bleeding or vaginal discharge.   Denies joint pain, back pain or muscle pain/cramps. Denies itching, rash, or wounds. Denies dizziness, headaches, numbness or seizures. Denies swollen lymph nodes or glands, denies easy bruising or bleeding. Denies anxiety, depression,  confusion, or decreased concentration.  Physical Exam: BP (!) 180/102 (BP Location: Left Arm, Patient Position: Sitting) Comment: MD Notified, taken manual, didn't take meds  Pulse 90   Temp 98.3 F (36.8 C) (Oral)   Resp 16   Ht _0  (1.575 m)   Wt 217 lb (98.4 kg)   LMP 01/05/2009   BMI 39.69 kg/m  General: Alert, oriented, no acute distress. HEENT: Normocephalic, atraumatic, sclera anicteric. Chest: Clear to auscultation bilaterally.  Unlabored breathing on room air. Cardiovascular: Regular rate and rhythm, no murmurs. Abdomen: Obese, soft, nontender.  Normoactive bowel sounds.  No masses or hepatosplenomegaly appreciated.  Well-healed incisions.   Extremities: Grossly normal range of motion.  Warm, well perfused.  Trace edema bilaterally. GU: Normal appearing external genitalia without erythema, excoriation, or lesions.  Speculum exam reveals mildly atrophic vaginal mucosa, no blood or discharge, cuff intact without masses.  Bimanual exam reveals no nodularity or masses.  Rectovaginal exam confirms this.  Laboratory & Radiologic Studies: None new  Assessment & Plan: Kristy [name] is a 66 y.o. 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  Postoperative course complicated by diagnosis of DVT of the right lower extremity of unknown age. Patient's case was discussed at tumor board.  Given high-grade lesion confined to a polyp with otherwise noninvasive low-grade disease, in the setting of her multiple comorbidities, recommendation was made for close surveillance and no adjuvant treatment.  Patient is doing well and is NED on exam today.  In the setting of her high-grade dysplasia with negative margins on hysterectomy, I recommend continued Pap smear  screening per ASCCP guidelines.  She will be due for a vaginal Pap in 12/2022.  Per NCCN surveillance recommendations, we will continue with follow-up visits every 6 months.  We discussed signs and symptoms again today that would be concerning for cancer recurrence and I stressed the importance of calling if she develops any of these.  Blood pressure elevated today.  She is asymptomatic.  She has not taken her blood pressure medicine today and was encouraged to do so.  Has an appointment with her PCP tomorrow.  Per radiology recommendations, repeat MRI abdomen ordered and scheduled today.   22 minutes of total time was spent for this patient encounter, including preparation, face-to-face counseling with the patient and coordination of care, and documentation of the encounter.  Jeral Pinch, MD  Division of Gynecologic Oncology  Department of Obstetrics and Gynecology  [facility]

## 2022-08-04 NOTE — Patient Instructions (Addendum)
It was good to see you today.  I do not see or feel any evidence of cancer recurrence on your exam.  I will see you for follow-up in 6 months.  Please call sometime after the the new year to schedule a visit to see me in March.  As always, if you develop any new and concerning symptoms before your next visit, please call to see me sooner.  I will call you with your MRI results once I have them.

## 2022-08-05 ENCOUNTER — Other Ambulatory Visit: Payer: Self-pay

## 2022-08-05 ENCOUNTER — Encounter: Payer: Self-pay | Admitting: Internal Medicine

## 2022-08-05 ENCOUNTER — Other Ambulatory Visit (HOSPITAL_COMMUNITY): Payer: Self-pay

## 2022-08-05 ENCOUNTER — Ambulatory Visit (INDEPENDENT_AMBULATORY_CARE_PROVIDER_SITE_OTHER): Payer: Medicare PPO | Admitting: Internal Medicine

## 2022-08-05 VITALS — BP 171/99 | HR 95 | Temp 97.7°F | Ht 62.0 in | Wt 217.0 lb

## 2022-08-05 DIAGNOSIS — N184 Chronic kidney disease, stage 4 (severe): Secondary | ICD-10-CM

## 2022-08-05 DIAGNOSIS — E785 Hyperlipidemia, unspecified: Secondary | ICD-10-CM | POA: Diagnosis not present

## 2022-08-05 DIAGNOSIS — E1142 Type 2 diabetes mellitus with diabetic polyneuropathy: Secondary | ICD-10-CM

## 2022-08-05 DIAGNOSIS — E1122 Type 2 diabetes mellitus with diabetic chronic kidney disease: Secondary | ICD-10-CM | POA: Diagnosis not present

## 2022-08-05 DIAGNOSIS — F1190 Opioid use, unspecified, uncomplicated: Secondary | ICD-10-CM

## 2022-08-05 DIAGNOSIS — I150 Renovascular hypertension: Secondary | ICD-10-CM | POA: Diagnosis not present

## 2022-08-05 DIAGNOSIS — N2889 Other specified disorders of kidney and ureter: Secondary | ICD-10-CM | POA: Diagnosis not present

## 2022-08-05 DIAGNOSIS — Z23 Encounter for immunization: Secondary | ICD-10-CM | POA: Insufficient documentation

## 2022-08-05 DIAGNOSIS — Z794 Long term (current) use of insulin: Secondary | ICD-10-CM

## 2022-08-05 MED ORDER — XULTOPHY 100-3.6 UNIT-MG/ML ~~LOC~~ SOPN
42.0000 [IU] | PEN_INJECTOR | Freq: Every day | SUBCUTANEOUS | 5 refills | Status: DC
  Filled 2022-08-05: qty 12, 30d supply, fill #0
  Filled 2022-08-06: qty 12, 29d supply, fill #0
  Filled 2022-09-03: qty 12, 29d supply, fill #1
  Filled 2022-10-05: qty 12, 29d supply, fill #2
  Filled 2022-10-26 – 2022-11-04 (×2): qty 12, 29d supply, fill #3
  Filled 2022-12-02: qty 12, 29d supply, fill #4
  Filled 2022-12-30: qty 12, 29d supply, fill #5

## 2022-08-05 MED ORDER — ZOSTER VAC RECOMB ADJUVANTED 50 MCG/0.5ML IM SUSR
0.5000 mL | Freq: Once | INTRAMUSCULAR | 1 refills | Status: AC
  Filled 2022-08-05 (×2): qty 0.5, 1d supply, fill #0
  Filled 2022-10-05 – 2022-12-02 (×6): qty 0.5, 1d supply, fill #1

## 2022-08-05 NOTE — Assessment & Plan Note (Signed)
Prescription sent in for shingles vaccines.

## 2022-08-05 NOTE — Progress Notes (Signed)
CC: routine check up  HPI:  [name] is a 66 y.o. person with past medical history as detailed below who presents for routine check up. Please Kristy problem based charting for detailed assessment and plan.  Past Medical History:  Diagnosis Date   Arthritis    Chronic kidney disease    Colon polyps    Colon polyps    Diabetes mellitus    Diverticulosis    Endometrial cancer ([place])    Family history of adverse reaction to anesthesia    Heart attack during anesthesia   Family history of breast cancer 02/25/2022   Family history of malignant neoplasm of ovary 02/25/2022   Hyperlipidemia    Hypertension    Pneumonia due to COVID-19 virus 11/24/2020   Renal artery stenosis ([place]) 2007    status post selective bilateral renal angiography, balloon angiopathy of the left renal artery, first diagnosed in 2007 based on MRA of the renal arteries which 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 Albertine Patricia   Sleep apnea 2007    status post polysomnogram 2007 , suggested use of CPAP   Ventricular hypertrophy 2006    a 2-D echo in 2006, ejection fraction 55%, mild tricuspid regurgitation noted as well, 2-D echo November 13, 8414 showed diastolic dysfunction with LVEF normal of 65%   Review of Systems:  Negative unless otherwise stated.  Physical Exam:  Vitals:   08/05/22 1022 08/05/22 1034  BP: (!) 164/110 (!) 171/99  Pulse: 96 95  Temp: 97.7 F (36.5 C)   TempSrc: Oral   SpO2: 99%   Weight: 217 lb (98.4 kg)   Height: '5\' 2"'$  (1.575 m)    Constitutional:Appears stated age, well. In no acute distress. Cardio:Regular rate and rhythm. No murmurs, rubs, or gallops. Pulm:Clear to auscultation bilaterally. Normal work of breathing on room air. Abdomen:Soft, nontender, nondistended. SAY:[id number] for extremity edema. Skin:Warm and dry. Neuro:Alert and oriented x3. No focal deficit noted. Psych:Pleasant mood and affect.   Assessment & Plan:   Kristy Encounters Tab for  problem based charting.  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 Patient states that she has not yet taken her BP medications today, which are carvedilol 25 mg BID and losartan-HCTZ daily. She does not check her BP at home. Plan:Will not make any adjustments to medications at this time as she has not taken her current medications. Patient reminded to take these daily and encouraged to check her BP at home with the cuff that she has.  DM type 2 with diabetic peripheral neuropathy (HCC) HbA1c of 9.7% in August 2023. Current regimen is Xultophy 40 units daily. She checks her blood glucose every few days, fasted, and these values are reflected on her glucometer print out that she brought today. On this the lowest value since September is 151, highest 218. She denies symptomatic hypoglycemia.  Plan:Increase Xultophy to 42 units daily. Patient encouraged to check her blood sugar more regularly. Consider sending in a CGM at future OV.  Dyslipidemia Lipid panel last checked April 2023. LDL was at goal at that time, 96. Current regimen is atorvastatin 40 mg daily. Plan:Continue current regimen. Repeat lipid panel at next OV.  Left kidney mass Patient will be due for follow up MRI in November 2023. Order has been placed.  Hypercalcemia Patient takes a daily multivitamin and, combined with her CKD stage 4, is likely taking in too much calcium.  Plan:Patient advised to stop taking multivitamin and discuss  vitamin and mineral supplementation needs with her nephrologist. Will check a CMP today.  Chronic, continuous use of opioids Patient currently receives tramadol 100 mg q6h PRN severe pain. She states that she has been increasingly able to space these doses out and handle her pain.  Need for immunization against influenza Flu vaccine given today.  Need for shingles vaccine Prescription sent in for shingles vaccines.  CKD (chronic kidney disease)  stage 4, GFR 15-29 ml/min (HCC) DEXA scan ordered.  Patient discussed with Dr. Daryll Drown

## 2022-08-05 NOTE — Patient Instructions (Signed)
Ms. Demarais,  It was a pleasure to care for you today. I'm glad that you are doing well!  Please make sure to take your blood pressure medication each morning. I would like you to increase your Xultophy to 42 units daily. I would also like you to stop taking your multivitamin.  I will let you know what your lab results show. Please follow up with our clinic in 3 months, or sooner if needed.  My best, Dr. Marlou Sa

## 2022-08-05 NOTE — Assessment & Plan Note (Signed)
Flu vaccine given today. 

## 2022-08-05 NOTE — Assessment & Plan Note (Signed)
-   DEXA scan ordered

## 2022-08-05 NOTE — Assessment & Plan Note (Signed)
Patient currently receives tramadol 100 mg q6h PRN severe pain. She states that she has been increasingly able to space these doses out and handle her pain.

## 2022-08-05 NOTE — Assessment & Plan Note (Addendum)
Patient takes a daily multivitamin and, combined with her CKD stage 4, is likely taking in too much calcium.  Plan:Patient advised to stop taking multivitamin and discuss vitamin and mineral supplementation needs with her nephrologist. Will check a CMP today.

## 2022-08-05 NOTE — Assessment & Plan Note (Signed)
HbA1c of 9.7% in August 2023. Current regimen is Xultophy 40 units daily. She checks her blood glucose every few days, fasted, and these values are reflected on her glucometer print out that she brought today. On this the lowest value since September is 151, highest 218. She denies symptomatic hypoglycemia.  Plan:Increase Xultophy to 42 units daily. Patient encouraged to check her blood sugar more regularly. Consider sending in a CGM at future OV.

## 2022-08-05 NOTE — Assessment & Plan Note (Addendum)
Patient will be due for follow up MRI in November 2023. Order has been placed.

## 2022-08-05 NOTE — Assessment & Plan Note (Signed)
Lipid panel last checked April 2023. LDL was at goal at that time, 96. Current regimen is atorvastatin 40 mg daily. Plan:Continue current regimen. Repeat lipid panel at next OV.

## 2022-08-05 NOTE — Assessment & Plan Note (Signed)
BP above goal today, 171/99. Patient states that she has not yet taken her BP medications today, which are carvedilol 25 mg BID and losartan-HCTZ daily. She does not check her BP at home. Plan:Will not make any adjustments to medications at this time as she has not taken her current medications. Patient reminded to take these daily and encouraged to check her BP at home with the cuff that she has.

## 2022-08-06 ENCOUNTER — Other Ambulatory Visit (HOSPITAL_COMMUNITY): Payer: Self-pay

## 2022-08-06 ENCOUNTER — Telehealth: Payer: Self-pay | Admitting: Internal Medicine

## 2022-08-06 LAB — CMP14 + ANION GAP
ALT: 17 IU/L (ref 0–32)
AST: 15 IU/L (ref 0–40)
Albumin/Globulin Ratio: 1.4 (ref 1.2–2.2)
Albumin: 3.9 g/dL (ref 3.9–4.9)
Alkaline Phosphatase: 125 IU/L — ABNORMAL HIGH (ref 44–121)
Anion Gap: 15 mmol/L (ref 10.0–18.0)
BUN/Creatinine Ratio: 17 (ref 12–28)
BUN: 36 mg/dL — ABNORMAL HIGH (ref 8–27)
Bilirubin Total: 0.5 mg/dL (ref 0.0–1.2)
CO2: 21 mmol/L (ref 20–29)
Calcium: 10.2 mg/dL (ref 8.7–10.3)
Chloride: 103 mmol/L (ref 96–106)
Creatinine, Ser: 2.11 mg/dL — ABNORMAL HIGH (ref 0.57–1.00)
Globulin, Total: 2.8 g/dL (ref 1.5–4.5)
Glucose: 190 mg/dL — ABNORMAL HIGH (ref 70–99)
Potassium: 4.5 mmol/L (ref 3.5–5.2)
Sodium: 139 mmol/L (ref 134–144)
Total Protein: 6.7 g/dL (ref 6.0–8.5)
eGFR: 25 mL/min/{1.73_m2} — ABNORMAL LOW (ref >59)

## 2022-08-06 NOTE — Telephone Encounter (Signed)
Attempted to contact patient to review results of recent OV. It sounded as if someone picked up the call, but no one responded to my inquiry of if they could hear me. I will attempt to call back at another time.  Farrel Gordon, DO

## 2022-08-10 NOTE — Progress Notes (Signed)
Internal Medicine Clinic Attending  Case discussed with Dr. Dean  at the time of the visit.  We reviewed the resident's history and exam and pertinent patient test results.  I agree with the assessment, diagnosis, and plan of care documented in the resident's note.  

## 2022-08-13 ENCOUNTER — Ambulatory Visit (HOSPITAL_COMMUNITY)
Admission: RE | Admit: 2022-08-13 | Discharge: 2022-08-13 | Disposition: A | Discharge status: Home / Self Care | Payer: Medicare PPO | Source: Ambulatory Visit | Attending: Gynecologic Oncology | Admitting: Gynecologic Oncology

## 2022-08-13 DIAGNOSIS — K8020 Calculus of gallbladder without cholecystitis without obstruction: Secondary | ICD-10-CM | POA: Diagnosis not present

## 2022-08-13 DIAGNOSIS — N2889 Other specified disorders of kidney and ureter: Secondary | ICD-10-CM | POA: Diagnosis not present

## 2022-08-13 DIAGNOSIS — N281 Cyst of kidney, acquired: Secondary | ICD-10-CM

## 2022-08-13 DIAGNOSIS — K449 Diaphragmatic hernia without obstruction or gangrene: Secondary | ICD-10-CM | POA: Diagnosis not present

## 2022-08-13 MED ORDER — GADOBUTROL 1 MMOL/ML IV SOLN
10.0000 mL | Freq: Once | INTRAVENOUS | Status: AC | PRN
  Administered 2022-08-13: 10 mL via INTRAVENOUS

## 2022-08-14 ENCOUNTER — Telehealth: Payer: Self-pay | Admitting: Surgery

## 2022-08-14 ENCOUNTER — Other Ambulatory Visit: Payer: Self-pay | Admitting: Gynecologic Oncology

## 2022-08-14 ENCOUNTER — Encounter: Payer: Self-pay | Admitting: Internal Medicine

## 2022-08-14 DIAGNOSIS — N281 Cyst of kidney, acquired: Secondary | ICD-10-CM

## 2022-08-14 DIAGNOSIS — N2889 Other specified disorders of kidney and ureter: Secondary | ICD-10-CM

## 2022-08-14 DIAGNOSIS — Z124 Encounter for screening for malignant neoplasm of cervix: Secondary | ICD-10-CM | POA: Insufficient documentation

## 2022-08-14 NOTE — Telephone Encounter (Signed)
Pt returning call  and is aware, per Dr. Berline Lopes, the recent MRI showed her left kidney continues to look stable. Recommendation is for repeat MRI in 6 months.   Pt is aware we will schedule this and call her back.

## 2022-08-14 NOTE — Telephone Encounter (Signed)
Attempted to call patient to relay results of MR abdomen, per Dr Berline Lopes. Patient does not have voicemail box set up, so was unable to leave a message.

## 2022-08-18 ENCOUNTER — Other Ambulatory Visit: Payer: Self-pay | Admitting: Internal Medicine

## 2022-08-18 DIAGNOSIS — F1190 Opioid use, unspecified, uncomplicated: Secondary | ICD-10-CM

## 2022-08-19 ENCOUNTER — Other Ambulatory Visit (HOSPITAL_COMMUNITY): Payer: Self-pay

## 2022-08-19 MED ORDER — TRAMADOL HCL 50 MG PO TABS
100.0000 mg | ORAL_TABLET | Freq: Two times a day (BID) | ORAL | 0 refills | Status: DC | PRN
  Filled 2022-08-19: qty 38, 30d supply, fill #0

## 2022-08-19 NOTE — Telephone Encounter (Signed)
PDMP reviewed and appropriate

## 2022-08-26 ENCOUNTER — Ambulatory Visit (INDEPENDENT_AMBULATORY_CARE_PROVIDER_SITE_OTHER): Payer: Medicare PPO

## 2022-08-26 ENCOUNTER — Ambulatory Visit: Payer: Medicare PPO | Admitting: Orthopaedic Surgery

## 2022-08-26 ENCOUNTER — Encounter: Payer: Self-pay | Admitting: Orthopaedic Surgery

## 2022-08-26 VITALS — Ht 61.0 in | Wt 218.0 lb

## 2022-08-26 DIAGNOSIS — M1712 Unilateral primary osteoarthritis, left knee: Secondary | ICD-10-CM | POA: Diagnosis not present

## 2022-08-26 NOTE — Progress Notes (Signed)
Office Visit Note   Patient: Kristy [name]           Date of Birth: 25-Dec-1955           MRN: [id number] Visit Date: 08/26/2022              Requested by: Kristy [name], Kristy Mesa, DO [address] [place],  [place] [address] PCP: Kristy Fuchs, DO   Assessment & Plan: Visit Diagnoses:  1. Primary osteoarthritis of left knee     Plan: Impression is advanced and severe tricompartmental osteoarthritis with varus deformity.  Given these findings and the fact that conservative management is no longer are effective we talked at length about knee replacement surgery.  However her BMI is currently above 40 and her A1c is greater than 7.7 which preclude [place] from moving forward with surgery at this time.  She will make an appointment to see her primary care doctor to get her BMI and A1c optimized.  She does also have a history of age-indeterminate DVT in the right lower extremity.  She was recently taken off of anticoagulation by her gynecologist.  Would like to send her to vascular surgery for formal recommendations how to manage this since we are looking at knee replacement on the other leg.  Follow-Up Instructions: No follow-ups on file.   Orders:  Orders Placed This Encounter  Procedures   XR KNEE 3 VIEW LEFT   No orders of the defined types were placed in this encounter.     Procedures: No procedures performed   Clinical Data: No additional findings.   Subjective: Chief Complaint  Patient presents with   Left Knee - Pain    HPI Kristy [name] comes back today for severe left knee pain.  We have been seeing her for the last year for this.  She has been taken off of Eliquis by her GYN.  Review of Systems  Constitutional: Negative.   HENT: Negative.    Eyes: Negative.   Respiratory: Negative.    Cardiovascular: Negative.   Endocrine: Negative.   Musculoskeletal: Negative.   Neurological: Negative.   Hematological: Negative.   Psychiatric/Behavioral: Negative.    All other  systems reviewed and are negative.    Objective: Vital Signs: Ht '5\' 1"'$  (1.549 m)   Wt 218 lb (98.9 kg)   LMP 01/05/2009   BMI 41.19 kg/m   Physical Exam Vitals and nursing note reviewed.  Constitutional:      Appearance: She is well-developed.  HENT:     Head: Atraumatic.     Nose: Nose normal.  Eyes:     Extraocular Movements: Extraocular movements intact.  Cardiovascular:     Pulses: Normal pulses.  Pulmonary:     Effort: Pulmonary effort is normal.  Abdominal:     Palpations: Abdomen is soft.  Musculoskeletal:     Cervical back: Neck supple.  Skin:    General: Skin is warm.     Capillary Refill: Capillary refill takes less than 2 seconds.  Neurological:     Mental Status: She is alert. Mental status is at baseline.  Psychiatric:        Behavior: Behavior normal.        Thought Content: Thought content normal.        Judgment: Judgment normal.     Ortho Exam Examination of the left knee shows varus deformity.  Medial joint line tenderness.  Grossly stable collaterals.  Pain and crepitus throughout range of motion. Specialty Comments:  No specialty comments  available.  Imaging: No results found.   PMFS History: Patient Active Problem List   Diagnosis Date Noted   Cervical cancer screening 08/14/2022   Need for immunization against influenza 08/05/2022   Need for shingles vaccine 08/05/2022   Left kidney mass 06/23/2022   Genetic testing 03/20/2022   Family history of malignant neoplasm of ovary 02/25/2022   Family history of breast cancer 02/25/2022   Deep vein thrombosis (DVT) ([place]) 01/28/2022   Endometrial cancer s/p total hysterectomy with bilateral oophorectomy 01/08/2022   Anxiety 04/04/2021   Lower extremity edema 06/08/2019   Primary osteoarthritis of left knee 07/29/2018   Obstructive sleep apnea 10/07/2017   CKD (chronic kidney disease) stage 4, GFR 15-29 ml/min ([place]) 06/04/2017   Hypercalcemia 06/26/2016   Vitamin D deficiency 12/07/2014    Moderate major depression ([place]) 08/09/2014   Chronic, continuous use of opioids 03/14/2013   Renovascular hypertension secondary to Fibromuscular Dysplasia  12/20/2012   DM type 2 with diabetic peripheral neuropathy ([place]) 12/06/2012   Dyslipidemia 07/21/2006   Past Medical History:  Diagnosis Date   Arthritis    Chronic kidney disease    Colon polyps    Colon polyps    Diabetes mellitus    Diverticulosis    Endometrial cancer ([place])    Family history of adverse reaction to anesthesia    Heart attack during anesthesia   Family history of breast cancer 02/25/2022   Family history of malignant neoplasm of ovary 02/25/2022   Hyperlipidemia    Hypertension    Pneumonia due to COVID-19 virus 11/24/2020   Renal artery stenosis ([place]) 2007    status post selective bilateral renal angiography, balloon angiopathy of the left renal artery, first diagnosed in 2007 based on MRA of the renal arteries which 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 Albertine [name]   Sleep apnea 2007    status post polysomnogram 2007 , suggested use of CPAP   Ventricular hypertrophy 2006    a 2-D echo in 2006, ejection fraction 55%, mild tricuspid regurgitation noted as well, 2-D echo November 13, 5174 showed diastolic dysfunction with LVEF normal of 65%    Family History  Problem Relation Age of Onset   Diabetes Mother    Cancer Mother    Ovarian cancer Mother        dx late 40s   Diabetes Father    Colon polyps Sister    Hypertension Sister    Breast cancer Sister 46   Colon polyps Sister    Hypertension Sister    Esophageal cancer Maternal Aunt        d. after age 39   Kidney cancer Paternal Aunt        dx after age 66   Hypertension Daughter    Stroke Daughter    Hypertension Daughter    Hypertension Daughter    Hypertension Daughter    Bipolar disorder Son    Alcohol abuse Son    Drug abuse Son    Breast cancer Cousin    Breast cancer Other        maternal female cousin; dx 52s   Colon  cancer Neg Hx    Stomach cancer Neg Hx    Rectal cancer Neg Hx     Past Surgical History:  Procedure Laterality Date   BREAST BIOPSY Right 2016   CERVICAL CONIZATION W/BX N/A 12/23/2021   Procedure: CONIZATION CERVIX WITH BIOPSY;  Surgeon: Lafonda Mosses, MD;  Location: [facility];  Service: Gynecology;  Laterality: N/A;   COLONOSCOPY  02/03/2010   DILATATION & CURETTAGE/HYSTEROSCOPY WITH MYOSURE N/A 12/23/2021   Procedure: DILATATION & CURETTAGE/HYSTEROSCOPY WITH MYOSURE;  Surgeon: Lafonda Mosses, MD;  Location: [facility];  Service: Gynecology;  Laterality: N/A;   DILATION AND CURETTAGE OF UTERUS N/A 12/23/2021   Procedure: ENDOCERVICAL CURETTAGE;  Surgeon: Lafonda Mosses, MD;  Location: [facility];  Service: Gynecology;  Laterality: N/A;   INTRAUTERINE DEVICE (IUD) INSERTION N/A 12/23/2021   Procedure: INTRAUTERINE DEVICE (IUD) INSERTION MIRENA;  Surgeon: Lafonda Mosses, MD;  Location: [facility];  Service: Gynecology;  Laterality: N/A;   LYMPH NODE DISSECTION N/A 01/13/2022   Procedure: LYMPH NODE DISSECTION;  Surgeon: Lafonda Mosses, MD;  Location: [facility];  Service: Gynecology;  Laterality: N/A;   RENAL ARTERY STENT  2007    patient is status post renal artery stent placement May 2007 and that was done secondary to renal artery stenosis, fibromuscular dysplasia   REPLACEMENT TOTAL KNEE  02/2011-Dr. Ninfa Linden   S/P Right Total Knee Replacement May/2012   ROBOTIC ASSISTED TOTAL HYSTERECTOMY WITH BILATERAL SALPINGO OOPHERECTOMY Bilateral 01/13/2022   Procedure: XI ROBOTIC ASSISTED TOTAL HYSTERECTOMY WITH BILATERAL SALPINGO OOPHORECTOMY mini laparotomy  OMENTECTOMY;  Surgeon: Lafonda Mosses, MD;  Location: [facility];  Service: Gynecology;  Laterality: Bilateral;   SENTINEL NODE BIOPSY Bilateral 01/13/2022   Procedure: SENTINEL NODE BIOPSY;  Surgeon: Lafonda Mosses, MD;  Location: [facility];  Service: Gynecology;  Laterality: Bilateral;   TUBAL LIGATION     Social History    Occupational History   Not on file  Tobacco Use   Smoking status: Former    Packs/day: 1.00    Years: 25.00    Total pack years: 25.00    Types: Cigarettes    Quit date: 10/26/2005    Years since quitting: 16.8   Smokeless tobacco: Never  Vaping Use   Vaping Use: Never used  Substance and Sexual Activity   Alcohol use: No    Alcohol/week: 0.0 standard drinks of alcohol   Drug use: No   Sexual activity: Not Currently    Partners: Male    Birth control/protection: Abstinence

## 2022-08-31 ENCOUNTER — Encounter: Payer: Self-pay | Admitting: Dietician

## 2022-09-03 ENCOUNTER — Other Ambulatory Visit (HOSPITAL_COMMUNITY): Payer: Self-pay

## 2022-09-04 ENCOUNTER — Other Ambulatory Visit (HOSPITAL_COMMUNITY): Payer: Self-pay

## 2022-09-08 ENCOUNTER — Other Ambulatory Visit (HOSPITAL_COMMUNITY): Payer: Self-pay

## 2022-09-24 ENCOUNTER — Other Ambulatory Visit: Payer: Medicare PPO

## 2022-09-28 ENCOUNTER — Other Ambulatory Visit: Payer: Self-pay | Admitting: Internal Medicine

## 2022-09-28 ENCOUNTER — Other Ambulatory Visit (HOSPITAL_COMMUNITY): Payer: Self-pay

## 2022-09-28 DIAGNOSIS — F1190 Opioid use, unspecified, uncomplicated: Secondary | ICD-10-CM

## 2022-09-28 MED ORDER — TRAMADOL HCL 50 MG PO TABS
100.0000 mg | ORAL_TABLET | Freq: Two times a day (BID) | ORAL | 0 refills | Status: DC | PRN
  Filled 2022-10-05: qty 38, 30d supply, fill #0

## 2022-09-28 NOTE — Telephone Encounter (Signed)
Please have patient follow-up in December if possible for diabetes management. Per Dr Howie Ill  Thanks

## 2022-09-28 NOTE — Telephone Encounter (Signed)
PDMP reviewed and appropriate.  Tramadol 50 mg #75 tabs sent to [facility].  She is taking this for Knee OA. Recent ortho appt with plans for knee replacement once A1c is under better control.

## 2022-09-28 NOTE — Telephone Encounter (Signed)
Last rx written 08/19/22. Last OV 08/05/22. Next OV 11/05/22 with Dr Humphrey Rolls.

## 2022-10-05 ENCOUNTER — Other Ambulatory Visit: Payer: Self-pay

## 2022-10-05 ENCOUNTER — Other Ambulatory Visit: Payer: Self-pay | Admitting: Internal Medicine

## 2022-10-05 ENCOUNTER — Other Ambulatory Visit (HOSPITAL_COMMUNITY): Payer: Self-pay

## 2022-10-05 DIAGNOSIS — F1190 Opioid use, unspecified, uncomplicated: Secondary | ICD-10-CM

## 2022-10-05 DIAGNOSIS — I150 Renovascular hypertension: Secondary | ICD-10-CM

## 2022-10-05 NOTE — Telephone Encounter (Signed)
Return pt's call who stated her daughter picked up Tramadol today but it was only for 38 tabs. Stated she usually get 75 tabs. Rx written 09/28/22 was for 38 tabs;stated this will not last her the whole month. Thanks

## 2022-10-05 NOTE — Telephone Encounter (Signed)
Requesting to speak with a nurse about traMADol (ULTRAM) 50 MG tablet. Please call pt back.

## 2022-10-06 ENCOUNTER — Other Ambulatory Visit (HOSPITAL_COMMUNITY): Payer: Self-pay

## 2022-10-06 MED ORDER — TRAMADOL HCL 50 MG PO TABS
100.0000 mg | ORAL_TABLET | Freq: Three times a day (TID) | ORAL | 0 refills | Status: DC | PRN
  Filled 2022-10-06 – 2022-10-29 (×3): qty 37, 6d supply, fill #0

## 2022-10-06 NOTE — Telephone Encounter (Signed)
I called and spoke with patient. Will send in refill with #37 for remaining dose this month. She has follow-up with Dr. Humphrey Rolls 1/24. Will plan to evaluate starting first line medications such as gabapentin or SNRI and gradually decrease tramadol amount.

## 2022-10-06 NOTE — Telephone Encounter (Signed)
Called and talked with pharmacy about additional medication. #37 tabs sent to [facility]

## 2022-10-07 ENCOUNTER — Other Ambulatory Visit (HOSPITAL_COMMUNITY): Payer: Self-pay

## 2022-10-07 MED ORDER — LOSARTAN POTASSIUM-HCTZ 100-25 MG PO TABS
1.0000 | ORAL_TABLET | Freq: Every day | ORAL | 0 refills | Status: DC
  Filled 2022-10-07: qty 90, 90d supply, fill #0

## 2022-10-12 ENCOUNTER — Other Ambulatory Visit (HOSPITAL_COMMUNITY): Payer: Self-pay

## 2022-10-14 ENCOUNTER — Telehealth: Payer: Self-pay | Admitting: *Deleted

## 2022-10-14 ENCOUNTER — Other Ambulatory Visit (HOSPITAL_COMMUNITY): Payer: Self-pay

## 2022-10-14 NOTE — Telephone Encounter (Signed)
Per Dr Berline Lopes patient scheduled for an MRI on 4/8/ at 12 pm

## 2022-10-15 ENCOUNTER — Other Ambulatory Visit (HOSPITAL_COMMUNITY): Payer: Self-pay

## 2022-10-27 ENCOUNTER — Other Ambulatory Visit (HOSPITAL_COMMUNITY): Payer: Self-pay

## 2022-10-29 ENCOUNTER — Other Ambulatory Visit (HOSPITAL_COMMUNITY): Payer: Self-pay

## 2022-11-04 ENCOUNTER — Other Ambulatory Visit (HOSPITAL_COMMUNITY): Payer: Self-pay

## 2022-11-05 ENCOUNTER — Encounter: Payer: Medicare PPO | Admitting: Student

## 2022-11-09 ENCOUNTER — Other Ambulatory Visit (HOSPITAL_COMMUNITY): Payer: Self-pay

## 2022-11-19 ENCOUNTER — Other Ambulatory Visit: Payer: Self-pay | Admitting: Internal Medicine

## 2022-11-19 DIAGNOSIS — F1190 Opioid use, unspecified, uncomplicated: Secondary | ICD-10-CM

## 2022-11-21 ENCOUNTER — Other Ambulatory Visit: Payer: Self-pay

## 2022-11-21 ENCOUNTER — Other Ambulatory Visit (HOSPITAL_COMMUNITY): Payer: Self-pay

## 2022-11-21 MED ORDER — TRAMADOL HCL 50 MG PO TABS
100.0000 mg | ORAL_TABLET | Freq: Four times a day (QID) | ORAL | 0 refills | Status: DC | PRN
  Filled 2022-11-21: qty 75, 10d supply, fill #0

## 2022-11-21 NOTE — Telephone Encounter (Signed)
PDMP reviewed and appropriate

## 2022-11-23 ENCOUNTER — Other Ambulatory Visit (HOSPITAL_COMMUNITY): Payer: Self-pay

## 2022-11-24 ENCOUNTER — Other Ambulatory Visit (HOSPITAL_COMMUNITY): Payer: Self-pay

## 2022-12-02 ENCOUNTER — Other Ambulatory Visit (HOSPITAL_COMMUNITY): Payer: Self-pay

## 2022-12-03 ENCOUNTER — Other Ambulatory Visit (HOSPITAL_COMMUNITY): Payer: Self-pay

## 2022-12-04 ENCOUNTER — Other Ambulatory Visit (HOSPITAL_COMMUNITY): Payer: Self-pay

## 2022-12-16 ENCOUNTER — Ambulatory Visit (INDEPENDENT_AMBULATORY_CARE_PROVIDER_SITE_OTHER): Payer: Medicare PPO

## 2022-12-16 ENCOUNTER — Encounter: Payer: Self-pay | Admitting: Internal Medicine

## 2022-12-16 ENCOUNTER — Other Ambulatory Visit (HOSPITAL_COMMUNITY): Payer: Self-pay

## 2022-12-16 ENCOUNTER — Ambulatory Visit (INDEPENDENT_AMBULATORY_CARE_PROVIDER_SITE_OTHER): Payer: Medicare PPO | Admitting: Internal Medicine

## 2022-12-16 VITALS — BP 166/109 | HR 91 | Temp 98.4°F | Ht 62.0 in | Wt 215.5 lb

## 2022-12-16 DIAGNOSIS — I10 Essential (primary) hypertension: Secondary | ICD-10-CM

## 2022-12-16 DIAGNOSIS — I150 Renovascular hypertension: Secondary | ICD-10-CM

## 2022-12-16 DIAGNOSIS — E1122 Type 2 diabetes mellitus with diabetic chronic kidney disease: Secondary | ICD-10-CM | POA: Diagnosis not present

## 2022-12-16 DIAGNOSIS — M1712 Unilateral primary osteoarthritis, left knee: Secondary | ICD-10-CM | POA: Diagnosis not present

## 2022-12-16 DIAGNOSIS — Z0000 Encounter for general adult medical examination without abnormal findings: Secondary | ICD-10-CM

## 2022-12-16 DIAGNOSIS — Z794 Long term (current) use of insulin: Secondary | ICD-10-CM | POA: Diagnosis not present

## 2022-12-16 DIAGNOSIS — Z87891 Personal history of nicotine dependence: Secondary | ICD-10-CM

## 2022-12-16 DIAGNOSIS — E1142 Type 2 diabetes mellitus with diabetic polyneuropathy: Secondary | ICD-10-CM | POA: Diagnosis not present

## 2022-12-16 DIAGNOSIS — N184 Chronic kidney disease, stage 4 (severe): Secondary | ICD-10-CM

## 2022-12-16 DIAGNOSIS — E785 Hyperlipidemia, unspecified: Secondary | ICD-10-CM

## 2022-12-16 LAB — POCT GLYCOSYLATED HEMOGLOBIN (HGB A1C): Hemoglobin A1C: 9.4 % — AB (ref 4.0–5.6)

## 2022-12-16 MED ORDER — AMLODIPINE BESYLATE 5 MG PO TABS
5.0000 mg | ORAL_TABLET | Freq: Every day | ORAL | 11 refills | Status: DC
  Filled 2022-12-16: qty 30, 30d supply, fill #0
  Filled 2023-01-06 – 2023-01-17 (×2): qty 30, 30d supply, fill #1

## 2022-12-16 NOTE — Progress Notes (Addendum)
Subjective:  CC: left knee pain  HPI:  [name] is a 67 y.o. female with a past medical history stated below and presents today for follow-up on chronic conditions and left knee pain. Please see problem based assessment and plan for additional details.  Past Medical History:  Diagnosis Date   Arthritis    Chronic kidney disease    Colon polyps    Colon polyps    Diabetes mellitus    Diverticulosis    Endometrial cancer ([place])    Family history of adverse reaction to anesthesia    Heart attack during anesthesia   Family history of breast cancer 02/25/2022   Family history of malignant neoplasm of ovary 02/25/2022   Hyperlipidemia    Hypertension    Pneumonia due to COVID-19 virus 11/24/2020   Renal artery stenosis ([name]) 2007    status post selective bilateral renal angiography, balloon angiopathy of the left renal artery, first diagnosed in 2007 based on MRA of the renal arteries which 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 Albertine Patricia   Sleep apnea 2007    status post polysomnogram 2007 , suggested use of CPAP   Ventricular hypertrophy 2006    a 2-D echo in 2006, ejection fraction 55%, mild tricuspid regurgitation noted as well, 2-D echo January 19, [id number] showed diastolic dysfunction with LVEF normal of 65%    Current Outpatient Medications on File Prior to Visit  Medication Sig Dispense Refill   Accu-Chek FastClix Lancets MISC Check blood sugar two times a day (Patient taking differently: 1 each by Other route in the morning and at bedtime.) 102 each 5   Ascorbic Acid (VITAMIN C) 1000 MG tablet Take 1,000 mg by mouth daily.     aspirin 81 MG EC tablet Take 81 mg by mouth daily.     atorvastatin (LIPITOR) 40 MG tablet Take 1 tablet (40 mg total) by mouth daily. 90 tablet 3   Black Elderberry (SAMBUCUS ELDERBERRY PO) Take 1 tablet by mouth daily.     Blood Glucose Monitoring Suppl (ACCU-CHEK GUIDE) w/Device KIT 1 each by Does not apply route 2 (two) times  daily. 1 kit 1   carvedilol (COREG) 25 MG tablet Take 1 tablet by mouth 2 times daily. 60 tablet 11   glucose 4 GM chewable tablet Chew 1 tablet by mouth as needed for low blood sugar.     glucose blood (ACCU-CHEK GUIDE) test strip Check blood sugar two times a day (Patient taking differently: 1 each by Other route in the morning and at bedtime.) 100 each 5   Insulin Degludec-Liraglutide (XULTOPHY) 100-3.6 UNIT-MG/ML SOPN Inject 42 Units into the skin daily. 15 mL 5   Insulin Pen Needle (UNIFINE PENTIPS) 31G X 6 MM MISC Use as directed 100 each 3   Multiple Vitamin (MULTIVITAMIN) tablet Take 1 tablet by mouth daily.     Probiotic Product (PROBIOTIC DAILY) CAPS Take 1 tablet by mouth daily at 12 noon.     traMADol (ULTRAM) 50 MG tablet Take 2 tablets (100 mg total) by mouth every 6 (six) hours as needed. 75 tablet 0   No current facility-administered medications on file prior to visit.    Family History  Problem Relation Age of Onset   Diabetes Mother    Cancer Mother    Ovarian cancer Mother        dx late 9s   Diabetes Father    Colon polyps Sister    Hypertension Sister  Breast cancer Sister 41   Colon polyps Sister    Hypertension Sister    Esophageal cancer Maternal Aunt        d. after age 64   Kidney cancer Paternal 65        dx after age 80   Hypertension Daughter    Stroke Daughter    Hypertension Daughter    Hypertension Daughter    Hypertension Daughter    Bipolar disorder Son    Alcohol abuse Son    Drug abuse Son    Breast cancer Cousin    Breast cancer Other        maternal female cousin; dx 64s   Colon cancer Neg Hx    Stomach cancer Neg Hx    Rectal cancer Neg Hx     Social History   Socioeconomic History   Marital status: Married    Spouse name: Not on file   Number of children: Not on file   Years of education: 55   Highest education level: Not on file  Occupational History   Not on file  Tobacco Use   Smoking status: Former    Packs/day:  1.00    Years: 25.00    Additional pack years: 0.00    Total pack years: 25.00    Types: Cigarettes    Quit date: 10/26/2005    Years since quitting: 17.2   Smokeless tobacco: Never  Vaping Use   Vaping Use: Never used  Substance and Sexual Activity   Alcohol use: No    Alcohol/week: 0.0 standard drinks of alcohol   Drug use: No   Sexual activity: Not Currently    Partners: Male    Birth control/protection: Abstinence  Other Topics Concern   Not on file  Social History Narrative   Current Social History 11/14/2020        Patient lives with spouse in a home which is 1 story. There are steps up to the entrance with a handrail which the patient uses.       Patient's method of transportation is personal car.      The highest level of education was some high school, 11th grade.      The patient currently disabled.      Identified important Relationships are "my husband"       Pets : a dog, Research scientist (medical) / Fun: "Nothing since Covid"       Current Stressors: None       Religious / Personal Beliefs: Non-Christian       Other: None    Social Determinants of Health   Financial Resource Strain: Not on file  Food Insecurity: No Food Insecurity (12/16/2022)   Hunger Vital Sign    Worried About Running Out of Food in the Last Year: Never true    Ran Out of Food in the Last Year: Never true  Transportation Needs: No Transportation Needs (12/16/2022)   PRAPARE - Hydrologist (Medical): No    Lack of Transportation (Non-Medical): No  Physical Activity: Inactive (12/16/2022)   Exercise Vital Sign    Days of Exercise per Week: 0 days    Minutes of Exercise per Session: 0 min  Stress: No Stress Concern Present (12/16/2022)   [place]    Feeling of Stress : Only a little  Social Connections: Socially Integrated (12/16/2022)   Social Connection and Isolation Panel [NHANES]  Frequency of Communication with Friends and Family: More than three times a week    Frequency of Social Gatherings with Friends and Family: Twice a week    Attends Religious Services: More than 4 times per year    Active Member of [employer] or Organizations: Yes    Attends Music therapist: More than 4 times per year    Marital Status: Married  Human resources officer Violence: Not At Risk (12/16/2022)   Humiliation, Afraid, Rape, and Kick questionnaire    Fear of Current or Ex-Partner: No    Emotionally Abused: No    Physically Abused: No    Sexually Abused: No    Review of Systems: ROS negative except for what is noted on the assessment and plan.  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  When this was repeated it was 166/109.  Current medications include losartan-HCTZ 100-25 and Coreg 25 mg twice daily.  She takes her blood pressure medications at night and took them last night.  She does not check her blood pressure at home.  She denies headache, changes in vision, shortness of breath, and chest pain. A/P: Continue current medications -BMP Add amlodipine 5 mg Follow-up in 4 weeks  DM type 2 with diabetic peripheral neuropathy (HCC) Last hemoglobin A1c at 9.7 in August 2023.  She is taking Xultophy 42 units daily.  She does not have her glucometer today and is unsure  of what average values are.  She typically checks her blood sugars in the morning.  Assessment: A1c remains uncontrolled at 9.4.  I do not want to make changes in insulin as it does not sound like she is consistent with checking her blood sugars.  In light of her renal dysfunction she would benefit from SGLT2 inhibitor.  I will recheck her BMP prior to initiation of this as GFR needs to be greater than 20. Plan: I was notified by lab staff that blood sample hemolyzed and cannot be used for BMP or lipid panel.  Order was changed to future and patient will need to come in for a lab only visit.  If GFR is greater than 20 would start empagliflozin for her.  If it is less than 20 then would recommend making changes to insulin.  I would have patient come back in to review glucometer in person prior to doing so. -Continue atorvastatin 40 mg, last LDL at 96, at goal for primary prevention  Addendum 3/14: GFR 23, will start empagliflozin 10 mg  Primary osteoarthritis of left knee History of severe tricompartmental osteoarthritis of her left knee.  She was evaluated by Dr.Xu initially in November 2022.  Surgery has been delayed as she was found to have endometrial cancer requiring hysterectomy complicated by DVT of right lower extremity.  She completed 3 months of anticoagulation for provoked DVT.  From last office note with Ortho  in November 2023, he wanted her BMI to be less than 40, diabetes well-controlled, and referral to vascular surgery with history of right lower extremity DVT. Assessment: Knee pain and function are greatly affecting her ability to do ADLs.  She is interested in surgery and would benefit from this. Plan: Message sent to Dr.Xu to clarify about what he would like from vascular surgery.  Addendum 2/23:  Dr. Erlinda Hong states that he does not need vascular evaluation. A1c will need to be less than 7.7 with BMI less than 40 prior to surgery.  CKD (chronic kidney disease) stage 4, GFR 15-29  ml/min ([place]) Addendum 01/07/23: BMP showed GFR 23, will start empagliflozin at 10 mg with results from EMPA-Kidney trial. Plan to obtain [facility] at follow-up with Dr. Lisabeth Devoid 3/25.   Patient discussed with Dr. Erroll Luna Markevius Trombetta, D.O. [facility]  PGY-2 Pager: [phone]  Phone: [phone] Date 01/07/2023  Time 6:27 PM

## 2022-12-16 NOTE — Patient Instructions (Addendum)
Thank you, [place] for allowing [place] to provide your care today.   Diabetes Your A1c remains elevated at 9.4 today. This needs to be less than 7 to give you best set up for having total knee replacement. I am rechecking your kidney labs and then would like to start a medication called empagliflozin. I will call you with lab results.  Blood pressure Your blood pressure is elevated in clinic. The goal would be to have this less that 130/80. I am starting another medication called amlodipine. Continue taking combination pill and coreg.  Left knee pain I am going to reach out to Dr. Erlinda Hong to clarify why he wants vascular surgery to see you. I will let you know next steps for this  Cholesterol I am rechecking cholesterol and will call with results.  I have ordered the following labs for you:  Lab Orders         Microalbumin / Creatinine Urine Ratio         BMP8+Anion Gap         Lipid Profile         POC Hbg A1C       I have ordered the following medication/changed the following medications:   Stop the following medications: There are no discontinued medications.   Start the following medications: Meds ordered this encounter  Medications   amLODipine (NORVASC) 5 MG tablet    Sig: Take 1 tablet (5 mg total) by mouth daily.    Dispense:  30 tablet    Refill:  11     Follow up:  4 weeks    We look forward to seeing you next time. Please call our clinic at [phone] if you have any questions or concerns. The best time to call is Monday-Friday from 9am-4pm, but there is someone available 24/7. If after hours or the weekend, call the main hospital number and ask for the Internal Medicine Resident On-Call. If you need medication refills, please notify your pharmacy one week in advance and they will send [place] a request.   Thank you for trusting me with your care. Wishing you the best!   Christiana Fuchs, [place]

## 2022-12-16 NOTE — Progress Notes (Signed)
Subjective:   Kristy [name] is a 67 y.o. female who presents for Medicare Annual (Subsequent) preventive examination.  Review of Systems    Defer to PCP.        Objective:    Today's Vitals   12/16/22 1548 12/16/22 1549  BP: (!) 182/98 (!) 166/109  Pulse: 95 91  Temp: 98.4 F (36.9 C)   TempSrc: Oral   SpO2: 100%   Weight: 215 lb 8 oz (97.8 kg)   Height: 5' 2"$  (1.575 m)    Body mass index is 39.42 kg/m.     12/16/2022    3:28 PM 08/05/2022   10:14 AM 06/22/2022    2:16 PM 01/27/2022   11:25 AM 01/16/2022    8:00 AM 01/13/2022    1:52 PM 01/12/2022    9:31 AM  Advanced Directives  Does Patient Have a Medical Advance Directive? No No No No No No No  Would patient like information on creating a medical advance directive? No - Patient declined No - Patient declined No - Patient declined No - Patient declined No - Patient declined No - Patient declined No - Patient declined    Current Medications (verified) Outpatient Encounter Medications as of 12/16/2022  Medication Sig   Accu-Chek FastClix Lancets MISC Check blood sugar two times a day (Patient taking differently: 1 each by Other route in the morning and at bedtime.)   amLODipine (NORVASC) 5 MG tablet Take 1 tablet (5 mg total) by mouth daily.   Ascorbic Acid (VITAMIN C) 1000 MG tablet Take 1,000 mg by mouth daily.   aspirin 81 MG EC tablet Take 81 mg by mouth daily.   atorvastatin (LIPITOR) 40 MG tablet Take 1 tablet (40 mg total) by mouth daily.   Black Elderberry (SAMBUCUS ELDERBERRY PO) Take 1 tablet by mouth daily.   Blood Glucose Monitoring Suppl (ACCU-CHEK GUIDE) w/Device KIT 1 each by Does not apply route 2 (two) times daily.   carvedilol (COREG) 25 MG tablet Take 1 tablet by mouth 2 times daily.   glucose 4 GM chewable tablet Chew 1 tablet by mouth as needed for low blood sugar.   glucose blood (ACCU-CHEK GUIDE) test strip Check blood sugar two times a day (Patient taking differently: 1 each by Other route in  the morning and at bedtime.)   Insulin Degludec-Liraglutide (XULTOPHY) 100-3.6 UNIT-MG/ML SOPN Inject 42 Units into the skin daily.   Insulin Pen Needle (UNIFINE PENTIPS) 31G X 6 MM MISC Use as directed   losartan-hydrochlorothiazide (HYZAAR) 100-25 MG tablet Take 1 tablet by mouth daily.   Multiple Vitamin (MULTIVITAMIN) tablet Take 1 tablet by mouth daily.   Probiotic Product (PROBIOTIC DAILY) CAPS Take 1 tablet by mouth daily at 12 noon.   traMADol (ULTRAM) 50 MG tablet Take 2 tablets (100 mg total) by mouth every 6 (six) hours as needed.   No facility-administered encounter medications on file as of 12/16/2022.    Allergies (verified) Patient has no known allergies.   History: Past Medical History:  Diagnosis Date   Arthritis    Chronic kidney disease    Colon polyps    Colon polyps    Diabetes mellitus    Diverticulosis    Endometrial cancer ([place])    Family history of adverse reaction to anesthesia    Heart attack during anesthesia   Family history of breast cancer 02/25/2022   Family history of malignant neoplasm of ovary 02/25/2022   Hyperlipidemia    Hypertension    Pneumonia  due to COVID-19 virus 11/24/2020   Renal artery stenosis [facility]) 2007    status post selective bilateral renal angiography, balloon angiopathy of the left renal artery, first diagnosed in 2007 based on MRA of the renal arteries which 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 Albertine Patricia   Sleep apnea 2007    status post polysomnogram 2007 , suggested use of CPAP   Ventricular hypertrophy 2006    a 2-D echo in 2006, ejection fraction 55%, mild tricuspid regurgitation noted as well, 2-D echo January 19, [id number] showed diastolic dysfunction with LVEF normal of 65%   Past Surgical History:  Procedure Laterality Date   BREAST BIOPSY Right 2016   CERVICAL CONIZATION W/BX N/A 12/23/2021   Procedure: CONIZATION CERVIX WITH BIOPSY;  Surgeon: Lafonda Mosses, MD;  Location: [facility];  Service:  Gynecology;  Laterality: N/A;   COLONOSCOPY  02/03/2010   DILATATION & CURETTAGE/HYSTEROSCOPY WITH MYOSURE N/A 12/23/2021   Procedure: DILATATION & CURETTAGE/HYSTEROSCOPY WITH MYOSURE;  Surgeon: Lafonda Mosses, MD;  Location: [facility];  Service: Gynecology;  Laterality: N/A;   DILATION AND CURETTAGE OF UTERUS N/A 12/23/2021   Procedure: ENDOCERVICAL CURETTAGE;  Surgeon: Lafonda Mosses, MD;  Location: [facility];  Service: Gynecology;  Laterality: N/A;   INTRAUTERINE DEVICE (IUD) INSERTION N/A 12/23/2021   Procedure: INTRAUTERINE DEVICE (IUD) INSERTION MIRENA;  Surgeon: Lafonda Mosses, MD;  Location: [facility];  Service: Gynecology;  Laterality: N/A;   LYMPH NODE DISSECTION N/A 01/13/2022   Procedure: LYMPH NODE DISSECTION;  Surgeon: Lafonda Mosses, MD;  Location: [facility];  Service: Gynecology;  Laterality: N/A;   RENAL ARTERY STENT  2007    patient is status post renal artery stent placement May 2007 and that was done secondary to renal artery stenosis, fibromuscular dysplasia   REPLACEMENT TOTAL KNEE  02/2011-Dr. Ninfa Linden   S/P Right Total Knee Replacement May/2012   ROBOTIC ASSISTED TOTAL HYSTERECTOMY WITH BILATERAL SALPINGO OOPHERECTOMY Bilateral 01/13/2022   Procedure: XI ROBOTIC ASSISTED TOTAL HYSTERECTOMY WITH BILATERAL SALPINGO OOPHORECTOMY mini laparotomy  OMENTECTOMY;  Surgeon: Lafonda Mosses, MD;  Location: [facility];  Service: Gynecology;  Laterality: Bilateral;   SENTINEL NODE BIOPSY Bilateral 01/13/2022   Procedure: SENTINEL NODE BIOPSY;  Surgeon: Lafonda Mosses, MD;  Location: [facility];  Service: Gynecology;  Laterality: Bilateral;   TUBAL LIGATION     Family History  Problem Relation Age of Onset   Diabetes Mother    Cancer Mother    Ovarian cancer Mother        dx late 62s   Diabetes Father    Colon polyps Sister    Hypertension Sister    Breast cancer Sister 39   Colon polyps Sister    Hypertension Sister    Esophageal cancer Maternal Aunt        d. after age  76   Kidney cancer Paternal 34        dx after age 27   Hypertension Daughter    Stroke Daughter    Hypertension Daughter    Hypertension Daughter    Hypertension Daughter    Bipolar disorder Son    Alcohol abuse Son    Drug abuse Son    Breast cancer Cousin    Breast cancer Other        maternal female cousin; dx 19s   Colon cancer Neg Hx    Stomach cancer Neg Hx    Rectal cancer Neg Hx    Social History   Socioeconomic  History   Marital status: Married    Spouse name: Not on file   Number of children: Not on file   Years of education: 11   Highest education level: Not on file  Occupational History   Not on file  Tobacco Use   Smoking status: Former    Packs/day: 1.00    Years: 25.00    Total pack years: 25.00    Types: Cigarettes    Quit date: 10/26/2005    Years since quitting: 17.1   Smokeless tobacco: Never  Vaping Use   Vaping Use: Never used  Substance and Sexual Activity   Alcohol use: No    Alcohol/week: 0.0 standard drinks of alcohol   Drug use: No   Sexual activity: Not Currently    Partners: Male    Birth control/protection: Abstinence  Other Topics Concern   Not on file  Social History Narrative   Current Social History 11/14/2020        Patient lives with spouse in a home which is 1 story. There are steps up to the entrance with a handrail which the patient uses.       Patient's method of transportation is personal car.      The highest level of education was some high school, 11th grade.      The patient currently disabled.      Identified important Relationships are "my husband"       Pets : a dog, Research scientist (medical) / Fun: "Nothing since Covid"       Current Stressors: None       Religious / Personal Beliefs: Non-Christian       Other: None    Social Determinants of Health   Financial Resource Strain: Not on file  Food Insecurity: No Food Insecurity (12/16/2022)   Hunger Vital Sign    Worried About Running Out of Food in the  Last Year: Never true    Ran Out of Food in the Last Year: Never true  Transportation Needs: No Transportation Needs (12/16/2022)   PRAPARE - Hydrologist (Medical): No    Lack of Transportation (Non-Medical): No  Physical Activity: Inactive (12/16/2022)   Exercise Vital Sign    Days of Exercise per Week: 0 days    Minutes of Exercise per Session: 0 min  Stress: No Stress Concern Present (12/16/2022)   [place]    Feeling of Stress : Only a little  Social Connections: Socially Integrated (12/16/2022)   Social Connection and Isolation Panel [NHANES]    Frequency of Communication with Friends and Family: More than three times a week    Frequency of Social Gatherings with Friends and Family: Twice a week    Attends Religious Services: More than 4 times per year    Active Member of [employer] or Organizations: Yes    Attends Music therapist: More than 4 times per year    Marital Status: Married    Tobacco Counseling Counseling given: Not Answered   Clinical Intake:  Pre-visit preparation completed: Yes  Pain : No/denies pain     BMI - recorded: 39.42 Nutritional Status: BMI > 30  Obese Nutritional Risks: None Diabetes: Yes CBG done?: No Did pt. bring in CBG monitor from home?: No  How often do you need to have someone help you when you read instructions, pamphlets, or other written materials from your doctor or  pharmacy?: 1 - Never  Diabetic?Yes   Interpreter Needed?: No  Information entered by :: Allexa Acoff, [place] 12/16/2022   Activities of Daily Living    12/16/2022    3:52 PM 12/16/2022    3:27 PM  In your present state of health, do you have any difficulty performing the following activities:  Hearing? 0 0  Vision? 0 0  Difficulty concentrating or making decisions? 1 1  Walking or climbing stairs? 1 1  Dressing or bathing? 0 0  Doing errands, shopping? 0 0     Patient Care Team: Masters, Joellen Jersey, DO as PCP - General (Internal Medicine) Macarthur Critchley, [place] as Referring Physician (Optometry)  Indicate any recent Medical Services you may have received from other than [facility] providers in the past year (date may be approximate).     Assessment:   This is a routine wellness examination for Kristy [name].  Hearing/Vision screen No results found.  Dietary issues and exercise activities discussed:     Goals Addressed   None   Depression Screen    12/16/2022    3:28 PM 08/05/2022   10:15 AM 06/22/2022    3:16 PM 01/27/2022   11:31 AM 12/31/2021    2:53 PM 09/04/2021    4:05 PM 04/02/2021   10:19 AM  PHQ 2/9 Scores  PHQ - 2 Score 2 0 3 0 0 0 0  PHQ- 9 Score 7 0 7        Fall Risk    12/16/2022    3:51 PM 12/16/2022    3:27 PM 08/05/2022   10:14 AM 06/22/2022    2:09 PM 01/27/2022    9:32 AM  [place] in the past year? 0 0 0 0 0  Number falls in past yr: 0 0  0 0  Injury with Fall? 0 0  0 0  Risk for fall due to : No Fall Risks Other (Comment) Impaired balance/gait    Risk for fall due to: Comment  knee pain     Follow up Falls evaluation completed Falls evaluation completed Falls evaluation completed Falls evaluation completed Falls evaluation completed    [place]:  Any stairs in or around the home? Yes  If so, are there any without handrails? No  Home free of loose throw rugs in walkways, pet beds, electrical cords, etc? Yes  Adequate lighting in your home to reduce risk of falls? Yes   ASSISTIVE DEVICES UTILIZED TO PREVENT FALLS:  Life alert? No  Use of a cane, walker or w/c? Yes  Grab bars in the bathroom? Yes  Shower chair or bench in shower? Yes  Elevated toilet seat or a handicapped toilet? No   TIMED UP AND GO:  Was the test performed? No .  Length of time to ambulate 10 feet: n/a sec.   Gait steady and fast with assistive device  Cognitive Function:        12/16/2022    3:53 PM   6CIT Screen  What Year? 0 points  What month? 0 points  What time? 0 points  Count back from 20 0 points  Months in reverse 0 points  Repeat phrase 0 points  Total Score 0 points    Immunizations Immunization History  Administered Date(s) Administered   Fluad Quad(high Dose 65+) 08/05/2022   Influenza Whole 12/30/2009, 09/30/2010   Influenza,inj,Quad PF,6+ Mos 08/17/2013, 08/09/2014, 07/29/2015, 11/05/2016, 07/15/2017, 07/19/2018, 06/28/2019, 07/15/2020   PNEUMOCOCCAL CONJUGATE-20 12/31/2021  Pneumococcal Polysaccharide-23 03/02/2009, 05/10/2014   Td 12/30/2009   Tdap 12/31/2021   Zoster Recombinat (Shingrix) 08/06/2022    TDAP status: Up to date  Flu Vaccine status: Up to date  Pneumococcal vaccine status: Up to date  Covid-19 vaccine status: Information provided on how to obtain vaccines.   Qualifies for Shingles Vaccine? Yes   Zostavax completed No   Shingrix Completed?: No.    Education has been provided regarding the importance of this vaccine. Patient has been advised to call insurance company to determine out of pocket expense if they have not yet received this vaccine. Advised may also receive vaccine at local pharmacy or Health Dept. Verbalized acceptance and understanding.  Screening Tests Health Maintenance  Topic Date Due   COVID-19 Vaccine (1) Never done   DEXA SCAN  Never done   Zoster Vaccines- Shingrix (2 of 2) 10/01/2022   PAP SMEAR-Modifier  11/12/2022   Diabetic kidney evaluation - Urine ACR  08/06/2027 (Originally 04/04/2021)   LIPID PANEL  01/28/2023   HEMOGLOBIN A1C  03/16/2023   FOOT EXAM  06/23/2023   OPHTHALMOLOGY EXAM  07/07/2023   Diabetic kidney evaluation - eGFR measurement  08/06/2023   Medicare Annual Wellness (AWV)  12/17/2023   MAMMOGRAM  05/13/2024   COLONOSCOPY (Pts 45-22yr Insurance coverage will need to be confirmed)  04/30/2028   DTaP/Tdap/Td (3 - Td or Tdap) 01/01/2032   Pneumonia Vaccine 67 Years old  Completed    INFLUENZA VACCINE  Completed   Hepatitis C Screening  Completed   HPV VACCINES  Aged Out    Health Maintenance  Health Maintenance Due  Topic Date Due   COVID-19 Vaccine (1) Never done   DEXA SCAN  Never done   Zoster Vaccines- Shingrix (2 of 2) 10/01/2022   PAP SMEAR-Modifier  11/12/2022    Colorectal cancer screening: Type of screening: Colonoscopy. Completed 04/30/2021. Repeat every 7 years  Mammogram status: Completed 05/13/2022. Repeat every year  Bone Density status: Ordered 08/05/2022. Pt provided with contact info and advised to call to schedule appt.  Lung Cancer Screening: (Low Dose CT Chest recommended if Age 67-80years, 30 pack-year currently smoking OR have quit w/in 15years.) does not qualify.   Lung Cancer Screening Referral: Defer to PCP.   Additional Screening:  Hepatitis C Screening: does qualify; Completed 04/23/2021.  Vision Screening: Recommended annual ophthalmology exams for early detection of glaucoma and other disorders of the eye. Is the patient up to date with their annual eye exam?  Yes  Who is the provider or what is the name of the office in which the patient attends annual eye exams? Defer to PCP.  If pt is not established with a provider, would they like to be referred to a provider to establish care? No .   Dental Screening: Recommended annual dental exams for proper oral hygiene  Community Resource Referral / Chronic Care Management: CRR required this visit?  No   CCM required this visit?  No      Plan:     I have personally reviewed and noted the following in the patient's chart:   Medical and social history Use of alcohol, tobacco or illicit drugs  Current medications and supplements including opioid prescriptions. Patient is not currently taking opioid prescriptions. Functional ability and status Nutritional status Physical activity Advanced directives List of other physicians Hospitalizations, surgeries, and ER visits in  previous 12 months Vitals Screenings to include cognitive, depression, and falls Referrals and appointments  In addition, I have  reviewed and discussed with patient certain preventive protocols, quality metrics, and best practice recommendations. A written personalized care plan for preventive services as well as general preventive health recommendations were provided to patient.     Decklyn Hornik, CMA   12/16/2022   Nurse Notes: Face to Face.  Kristy [name] , Thank you for taking time to come for your Medicare Wellness Visit. I appreciate your ongoing commitment to your health goals. Please review the following plan we discussed and let me know if I can assist you in the future.   These are the goals we discussed:  Goals       Blood Pressure < 140/90      HEMOGLOBIN A1C < 7.0      Increase physical activity (pt-stated)      Patient will start using exercise band 3X/wk        This is a list of the screening recommended for you and due dates:  Health Maintenance  Topic Date Due   COVID-19 Vaccine (1) Never done   DEXA scan (bone density measurement)  Never done   Zoster (Shingles) Vaccine (2 of 2) 10/01/2022   Pap Smear  11/12/2022   Yearly kidney health urinalysis for diabetes  08/06/2027*   Lipid (cholesterol) test  01/28/2023   Hemoglobin A1C  03/16/2023   Complete foot exam   06/23/2023   Eye exam for diabetics  07/07/2023   Yearly kidney function blood test for diabetes  08/06/2023   Medicare Annual Wellness Visit  12/17/2023   Mammogram  05/13/2024   Colon Cancer Screening  04/30/2028   DTaP/Tdap/Td vaccine (3 - Td or Tdap) 01/01/2032   Pneumonia Vaccine  Completed   Flu Shot  Completed   Hepatitis C Screening: USPSTF Recommendation to screen - Ages 87-79 yo.  Completed   HPV Vaccine  Aged Out  *Topic was postponed. The date shown is not the original due date.

## 2022-12-16 NOTE — Patient Instructions (Signed)

## 2022-12-17 ENCOUNTER — Other Ambulatory Visit (HOSPITAL_COMMUNITY): Payer: Self-pay

## 2022-12-17 LAB — GLUCOSE, CAPILLARY: Glucose-Capillary: 169 mg/dL — ABNORMAL HIGH (ref 70–99)

## 2022-12-18 NOTE — Assessment & Plan Note (Signed)
Blood pressure elevated at 182/98.  When this was repeated it was 166/109.  Current medications include losartan-HCTZ 100-25 and Coreg 25 mg twice daily.  She takes her blood pressure medications at night and took them last night.  She does not check her blood pressure at home.  She denies headache, changes in vision, shortness of breath, and chest pain. A/P: Continue current medications -BMP Add amlodipine 5 mg Follow-up in 4 weeks

## 2022-12-18 NOTE — Assessment & Plan Note (Addendum)
Last hemoglobin A1c at 9.7 in August 2023.  She is taking Xultophy 42 units daily.  She does not have her glucometer today and is unsure of what average values are.  She typically checks her blood sugars in the morning.  Assessment: A1c remains uncontrolled at 9.4.  I do not want to make changes in insulin as it does not sound like she is consistent with checking her blood sugars.  In light of her renal dysfunction she would benefit from SGLT2 inhibitor.  I will recheck her BMP prior to initiation of this as GFR needs to be greater than 20. Plan: I was notified by lab staff that blood sample hemolyzed and cannot be used for BMP or lipid panel.  Order was changed to future and patient will need to come in for a lab only visit.  If GFR is greater than 20 would start empagliflozin for her.  If it is less than 20 then would recommend making changes to insulin.  I would have patient come back in to review glucometer in person prior to doing so. -Continue atorvastatin 40 mg, last LDL at 96, at goal for primary prevention  Addendum 3/14: GFR 23, will start empagliflozin 10 mg

## 2022-12-18 NOTE — Assessment & Plan Note (Addendum)
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  She was evaluated by Dr.Xu initially in November 2022.  Surgery has been delayed as she was found to have endometrial cancer requiring hysterectomy complicated by DVT of right lower extremity.  She completed 3 months of anticoagulation for provoked DVT.  From last office note with Ortho in November 2023, he wanted her BMI to be less than 40, diabetes well-controlled, and referral to vascular surgery with history of right lower extremity DVT. Assessment: Knee pain and function are greatly affecting her ability to do ADLs.  She is interested in surgery and would benefit from this. Plan: Message sent to Dr.Xu to clarify about what he would like from vascular surgery.  Addendum 2/23:  Dr. Erlinda Hong states that he does not need vascular evaluation. A1c will need to be less than 7.7 with BMI less than 40 prior to surgery.

## 2022-12-18 NOTE — Addendum Note (Signed)
Addended by: Charise Killian on: 12/18/2022 09:46 AM   Modules accepted: Level of Service

## 2022-12-18 NOTE — Progress Notes (Signed)
Internal Medicine Clinic Attending  Case discussed with Dr. Masters  At the time of the visit.  We reviewed the resident's history and exam and pertinent patient test results.  I agree with the assessment, diagnosis, and plan of care documented in the resident's note.  

## 2022-12-25 ENCOUNTER — Other Ambulatory Visit (INDEPENDENT_AMBULATORY_CARE_PROVIDER_SITE_OTHER): Payer: Medicare PPO

## 2022-12-25 DIAGNOSIS — E785 Hyperlipidemia, unspecified: Secondary | ICD-10-CM | POA: Diagnosis not present

## 2022-12-25 DIAGNOSIS — N184 Chronic kidney disease, stage 4 (severe): Secondary | ICD-10-CM

## 2022-12-25 DIAGNOSIS — I10 Essential (primary) hypertension: Secondary | ICD-10-CM | POA: Diagnosis not present

## 2022-12-26 LAB — LIPID PANEL
Chol/HDL Ratio: 4 ratio (ref 0.0–4.4)
Cholesterol, Total: 163 mg/dL (ref 100–199)
HDL: 41 mg/dL (ref >39)
LDL Chol Calc (NIH): 103 mg/dL — ABNORMAL HIGH (ref 0–99)
Triglycerides: 103 mg/dL (ref 0–149)
VLDL Cholesterol Cal: 19 mg/dL (ref 5–40)

## 2022-12-26 LAB — BMP8+ANION GAP
Anion Gap: 14 mmol/L (ref 10.0–18.0)
BUN/Creatinine Ratio: 17 (ref 12–28)
BUN: 40 mg/dL — ABNORMAL HIGH (ref 8–27)
CO2: 22 mmol/L (ref 20–29)
Calcium: 10.2 mg/dL (ref 8.7–10.3)
Chloride: 103 mmol/L (ref 96–106)
Creatinine, Ser: 2.3 mg/dL — ABNORMAL HIGH (ref 0.57–1.00)
Glucose: 172 mg/dL — ABNORMAL HIGH (ref 70–99)
Potassium: 4.6 mmol/L (ref 3.5–5.2)
Sodium: 139 mmol/L (ref 134–144)
eGFR: 23 mL/min/{1.73_m2} — ABNORMAL LOW (ref >59)

## 2022-12-28 NOTE — Progress Notes (Signed)
Internal Medicine Clinic Attending  Case and documentation of Dr. Howie Ill  soon after the resident saw the patient reviewed.  I reviewed the AWV findings.  I agree with the assessment, diagnosis, and plan of care documented in the AWV note.

## 2022-12-30 ENCOUNTER — Other Ambulatory Visit: Payer: Self-pay

## 2023-01-01 ENCOUNTER — Other Ambulatory Visit: Payer: Self-pay | Admitting: Internal Medicine

## 2023-01-01 ENCOUNTER — Other Ambulatory Visit: Payer: Self-pay

## 2023-01-01 ENCOUNTER — Other Ambulatory Visit (HOSPITAL_COMMUNITY): Payer: Self-pay

## 2023-01-01 DIAGNOSIS — I150 Renovascular hypertension: Secondary | ICD-10-CM

## 2023-01-02 ENCOUNTER — Other Ambulatory Visit (HOSPITAL_COMMUNITY): Payer: Self-pay

## 2023-01-02 MED ORDER — LOSARTAN POTASSIUM-HCTZ 100-25 MG PO TABS
1.0000 | ORAL_TABLET | Freq: Every day | ORAL | 0 refills | Status: DC
  Filled 2023-01-02: qty 90, 90d supply, fill #0

## 2023-01-04 ENCOUNTER — Other Ambulatory Visit: Payer: Self-pay

## 2023-01-06 ENCOUNTER — Other Ambulatory Visit: Payer: Self-pay | Admitting: Internal Medicine

## 2023-01-06 ENCOUNTER — Other Ambulatory Visit: Payer: Self-pay

## 2023-01-06 DIAGNOSIS — F1190 Opioid use, unspecified, uncomplicated: Secondary | ICD-10-CM

## 2023-01-07 MED ORDER — EMPAGLIFLOZIN 10 MG PO TABS
10.0000 mg | ORAL_TABLET | Freq: Every day | ORAL | 3 refills | Status: DC
  Filled 2023-01-07: qty 30, 30d supply, fill #0

## 2023-01-07 NOTE — Assessment & Plan Note (Signed)
Addendum 01/07/23: BMP showed GFR 23, will start empagliflozin at 10 mg with results from EMPA-Kidney trial. Plan to obtain [facility] at follow-up with Dr. Lisabeth Devoid 3/25.

## 2023-01-07 NOTE — Addendum Note (Signed)
Addended by: Edwyna Perfect on: 01/07/2023 06:27 PM   Modules accepted: Orders

## 2023-01-08 ENCOUNTER — Other Ambulatory Visit (HOSPITAL_COMMUNITY): Payer: Self-pay

## 2023-01-08 MED ORDER — TRAMADOL HCL 50 MG PO TABS
100.0000 mg | ORAL_TABLET | Freq: Two times a day (BID) | ORAL | 0 refills | Status: DC | PRN
  Filled 2023-01-08: qty 75, 19d supply, fill #0

## 2023-01-08 NOTE — Telephone Encounter (Signed)
Pt calling back to f/u with her Refill Request that was sent to her pharmacy on 01/06/2023.  traMADol (ULTRAM) 50 MG tablet   [facility] [place]

## 2023-01-08 NOTE — Telephone Encounter (Signed)
With renal function she is able to take up to 200 mg daily. RX instructions updated to reflect this. I called and spoke with patient about this as well.

## 2023-01-11 ENCOUNTER — Other Ambulatory Visit (HOSPITAL_COMMUNITY): Payer: Self-pay

## 2023-01-16 IMAGING — PT NM PET TUM IMG INITIAL (PI) SKULL BASE T - THIGH
1 of 7 series · 1 of 25 positions shown · non-contrast
Comparison: None.

CLINICAL DATA: Initial treatment strategy for endometrial cancer.

EXAM:
NUCLEAR MEDICINE PET SKULL BASE TO THIGH
TECHNIQUE: 10.9 mCi F-18 FDG was injected intravenously. Full-ring PET imaging
was performed from the skull base to thigh after the radiotracer. CT
data was obtained and used for attenuation correction and anatomic
localization.
Fasting blood glucose: 165 mg/dl

[Series 4: ct sk_thigh 5.0 hd_fov · axial · 5.0mm · 1.17mm/px · 1 of 226 slices shown]
[im 226/226  brain]
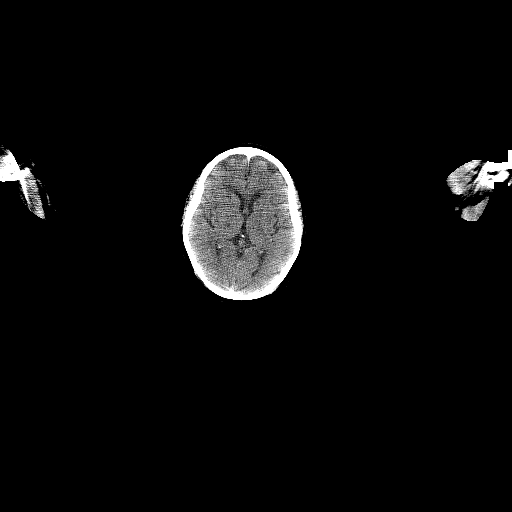

[1 of 25 positions shown; findings below may reference images not displayed]

FINDINGS: Mediastinal blood pool activity: SUV max conclusions

Liver activity: SUV max NA

NECK: No hypermetabolic lymph nodes in the neck.

Incidental CT findings: none

CHEST: No hypermetabolic mediastinal or hilar nodes. No suspicious
pulmonary nodules on the CT scan.

No hypermetabolic breast masses, supraclavicular or axillary
adenopathy.

Incidental CT findings: Age advanced aortic and coronary artery
calcifications. Mild cardiac enlargement. Moderate-sized hiatal
hernia.

ABDOMEN/PELVIS: No hypermetabolic endometrial mass is identified.
Small focus of hypermetabolism noted near the internal os with SUV
max of 5.42. An IUD is noted in the endometrial canal. No enlarged
or hypermetabolic pelvic lymph nodes. Both ovaries are unremarkable.
There is a partially calcified lesion in the right lower quadrant
adjacent to the ascending colon it is possible this is associated
with the appendix. No hypermetabolism to suggest a neoplastic
process. This may need attention during the patient's definitive
surgery.

No hypermetabolic omental or peritoneal surface lesions to suggest
peritoneal carcinomatosis. There is a 16 mm low-attenuation omental
nodule on image number 104/4. No hypermetabolism. It measures 6
Hounsfield units and is probably a benign omental cyst.

Incidental CT findings: There is an indeterminate left renal lesion.
This is projecting posteriorly off the upper pole region and
measures a maximum of 3.7 cm. It measures 32 Hounsfield units and
could be a complex/hemorrhagic cyst. No hypermetabolism. This was
not present on the prior MRI of the abdomen from 3663. And
ultrasound examination of the kidneys may be helpful for further
characterisation. Moderate age advanced atherosclerotic
calcifications involving the aorta and branch vessels.
Cholelithiasis is noted.

Simple appearing cyst associated with the right ovary measures
cm. Calcified uterine fibroids are noted.

Moderate scattered colonic diverticulosis.

SKELETON: No findings suspicious for osseous metastatic disease.

Incidental CT findings: none
IMPRESSION: 1. No discrete hypermetabolic endometrial mass. Small focus of
hypermetabolism noted near the internal os could be some residual
tumor.
2. No enlarged or hypermetabolic pelvic lymph nodes.
3. 16 mm low-attenuation right omental lesion is likely a benign
omental cyst. No hypermetabolism.
4. Partially calcified right lower quadrant mass near the ascending
colon could be associated with the appendix. No hypermetabolism is
identified but this may need attention during patient's definitive
surgery.
5. Indeterminate left renal lesion as detailed above. No
hypermetabolism is identified and this may be a benign
complex/hemorrhagic or proteinaceous cyst. Renal ultrasound
examination may be helpful for further characterization.
6. No findings for metastatic disease involving the chest or bony
structures.
7. Calcified uterine fibroids, simple appearing right ovarian cyst
and IUD noted in the endometrial canal.
8. Age advanced vascular disease.

## 2023-01-18 ENCOUNTER — Encounter: Payer: Self-pay | Admitting: Internal Medicine

## 2023-01-18 ENCOUNTER — Ambulatory Visit (INDEPENDENT_AMBULATORY_CARE_PROVIDER_SITE_OTHER): Payer: Medicare PPO | Admitting: Internal Medicine

## 2023-01-18 ENCOUNTER — Other Ambulatory Visit (HOSPITAL_COMMUNITY): Payer: Self-pay

## 2023-01-18 VITALS — BP 119/69 | HR 84 | Temp 97.9°F | Ht 62.0 in | Wt 209.9 lb

## 2023-01-18 DIAGNOSIS — E1142 Type 2 diabetes mellitus with diabetic polyneuropathy: Secondary | ICD-10-CM | POA: Diagnosis not present

## 2023-01-18 DIAGNOSIS — Z0000 Encounter for general adult medical examination without abnormal findings: Secondary | ICD-10-CM

## 2023-01-18 DIAGNOSIS — E785 Hyperlipidemia, unspecified: Secondary | ICD-10-CM

## 2023-01-18 DIAGNOSIS — I150 Renovascular hypertension: Secondary | ICD-10-CM

## 2023-01-18 MED ORDER — ATORVASTATIN CALCIUM 40 MG PO TABS
40.0000 mg | ORAL_TABLET | Freq: Every day | ORAL | 3 refills | Status: DC
  Filled 2023-01-18: qty 90, 90d supply, fill #0

## 2023-01-18 NOTE — Progress Notes (Unsigned)
CC: HTN, DM  HPI:Kristy [name] is a 67 y.o. female who presents for evaluation of HTN, DM. Please see individual problem based A/P for details.  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 She says she takes Environmental education officer at night.  BP controlled 119/78.  Coreg was initiated last visit. BP better controlled with interval worsening of renal function. Do ot believe this change represents expected GFR "dip" given concomitant rise in Cr, anion gap and metabolic acidosis. Called patient to discuss results. She is still asymptomatic. Do still feel that this medication would be beneficial if she is able to tolerate it, so will have patient increase her fluid intake and plan for repeat BMP within the next few days to monitor for improvement. If no change, will have to discontinue Jardiance. Message sent to front desk to get patient scheduled for lab only visit.   DM type 2 with diabetic peripheral neuropathy [facility]) Patient has had progressive worsening of control. She takes Xultophy at night and says she does alright with it. No complaints of NVD. Has lost 6 lbs. She says CBG typically between 100-200. Has not had any into the 300s. She did not bring her meter with her. She would benefit from CGM. We will place referral to [facility] for this.   Healthcare maintenance Due for Dexa, wanting this done. Referral placed.  She wants Shingrix shot, plans on getting this through her pharmacy.    Depression, PHQ-9: Based on the patients  [place] Visit from 01/18/2023 in [facility]  PHQ-9 Total Score 7      score we have .  Past Medical History:  Diagnosis Date   Arthritis    Chronic kidney disease    Colon polyps    Colon  polyps    Diabetes mellitus    Diverticulosis    Endometrial cancer ([place])    Family history of adverse reaction to anesthesia    Heart attack during anesthesia   Family history of breast cancer 02/25/2022   Family history of malignant neoplasm of ovary 02/25/2022   Hyperlipidemia    Hypertension    Pneumonia due to COVID-19 virus 11/24/2020   Renal artery stenosis ([place]) 2007    status post selective bilateral renal angiography, balloon angiopathy of the left renal artery, first diagnosed in 2007 based on MRA of the renal arteries which 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 Albertine Patricia   Sleep apnea 2007    status post polysomnogram 2007 , suggested use of CPAP   Ventricular hypertrophy 2006    a 2-D echo in 2006, ejection fraction 55%, mild tricuspid regurgitation noted as well, 2-D echo January 19, [id number] showed diastolic dysfunction with LVEF normal of 65%   Review of Systems:   See hpi  Physical Exam: Vitals:   01/18/23 1313  BP: 119/69  Pulse: 84  Temp: 97.9 F (36.6 C)  TempSrc: Oral  SpO2: 100%  Weight: 209 lb 14.4 oz (95.2 kg)  Height: 5\' 2"  (1.575 m)   General: nad HEENT: Conjunctiva nl , antiicteric sclerae, moist mucous membranes, no exudate or erythema Cardiovascular: Normal rate, regular rhythm.  No murmurs, rubs, or gallops Pulmonary : Equal breath sounds, No wheezes, rales, or rhonchi Abdominal: soft, nontender,  bowel sounds present  Ext: No edema in lower extremities, no tenderness to palpation of lower extremities.   Assessment & Plan:   See Encounters Tab for problem based charting.  Patient discussed with Dr.  Saverio Danker

## 2023-01-18 NOTE — Patient Instructions (Signed)
Dear Kristy [name],  Thank you for trusting [place] with your care.   We discussed your blood pressure and diabetes.  We will not make any changes to your blood pressure medicines.  For your diabetes, we will check some blood work today. If we are able, we will increase the Jardiance to 25mg .  I have also referred you to our diabetes specialist for a continuous glucose monitor. Please return in 3 months for a follow up. I have ordered a DEXA scan.  Please get your shingles shot from the pharmacy.

## 2023-01-19 LAB — BMP8+ANION GAP
Anion Gap: 23 mmol/L — ABNORMAL HIGH (ref 10.0–18.0)
BUN/Creatinine Ratio: 20 (ref 12–28)
BUN: 60 mg/dL — ABNORMAL HIGH (ref 8–27)
CO2: 16 mmol/L — ABNORMAL LOW (ref 20–29)
Calcium: 10.3 mg/dL (ref 8.7–10.3)
Chloride: 101 mmol/L (ref 96–106)
Creatinine, Ser: 3.06 mg/dL — ABNORMAL HIGH (ref 0.57–1.00)
Glucose: 154 mg/dL — ABNORMAL HIGH (ref 70–99)
Potassium: 4.1 mmol/L (ref 3.5–5.2)
Sodium: 140 mmol/L (ref 134–144)
eGFR: 16 mL/min/{1.73_m2} — ABNORMAL LOW (ref >59)

## 2023-01-19 NOTE — Assessment & Plan Note (Signed)
Patient states she took amlodipine and Jardiance this morning. She says she takes Environmental education officer at night.  BP controlled 119/78.  Coreg was initiated last visit. BP better controlled with interval worsening of renal function. Do ot believe this change represents expected GFR "dip" given concomitant rise in Cr, anion gap and metabolic acidosis. Called patient to discuss results. She is still asymptomatic. Do still feel that this medication would be beneficial if she is able to tolerate it, so will have patient increase her fluid intake and plan for repeat BMP within the next few days to monitor for improvement. If no change, will have to discontinue Jardiance. Message sent to front desk to get patient scheduled for lab only visit.

## 2023-01-20 ENCOUNTER — Encounter: Payer: Self-pay | Admitting: Internal Medicine

## 2023-01-20 NOTE — Assessment & Plan Note (Signed)
Patient has had progressive worsening of control. She takes Xultophy at night and says she does alright with it. No complaints of NVD. Has lost 6 lbs. She says CBG typically between 100-200. Has not had any into the 300s. She did not bring her meter with her. She would benefit from CGM. We will place referral to [facility] for this.

## 2023-01-20 NOTE — Assessment & Plan Note (Signed)
Due for Dexa, wanting this done. Referral placed.  She wants Shingrix shot, plans on getting this through her pharmacy.

## 2023-01-21 NOTE — Progress Notes (Signed)
Internal Medicine Clinic Attending ° °Case discussed with Dr. Gawaluck  At the time of the visit.  We reviewed the resident’s history and exam and pertinent patient test results.  I agree with the assessment, diagnosis, and plan of care documented in the resident’s note.  °

## 2023-01-26 ENCOUNTER — Telehealth: Payer: Self-pay | Admitting: Internal Medicine

## 2023-02-01 ENCOUNTER — Ambulatory Visit (HOSPITAL_COMMUNITY): Admission: RE | Admit: 2023-02-01 | Payer: Medicare PPO | Source: Ambulatory Visit

## 2023-02-05 ENCOUNTER — Telehealth: Payer: Self-pay | Admitting: *Deleted

## 2023-02-05 NOTE — Telephone Encounter (Signed)
Attempted to reach the patient to reschedule MRI. Patient's daughter answered the phone and stated "She died a week ago. We don't know what happened. We are waiting for the death certificate." MD notified

## 2023-02-18 IMAGING — US US RENAL
1 series · 14 of 25 positions shown · non-contrast
Comparison: PET/CT 01/07/2022.

CLINICAL DATA: Renal cysts seen on PET-CT.

EXAM:
RENAL / URINARY TRACT ULTRASOUND COMPLETE

[Series 1: us renal · 14 of 51 slices shown]
[im 1/51]
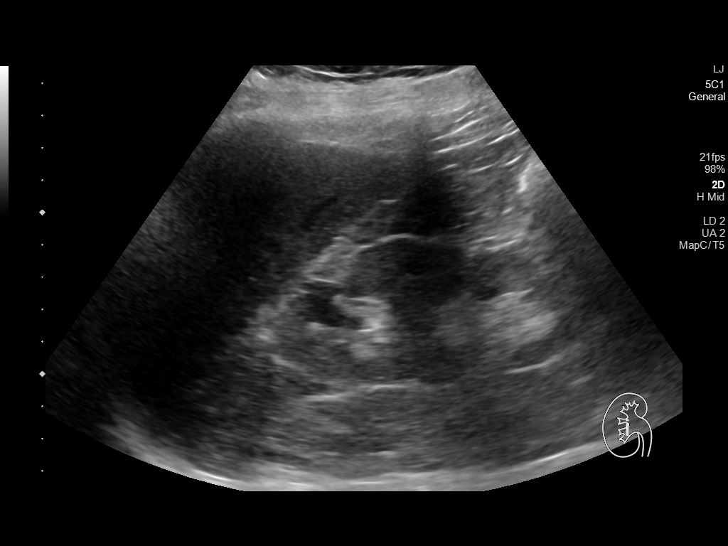
[im 5/51]
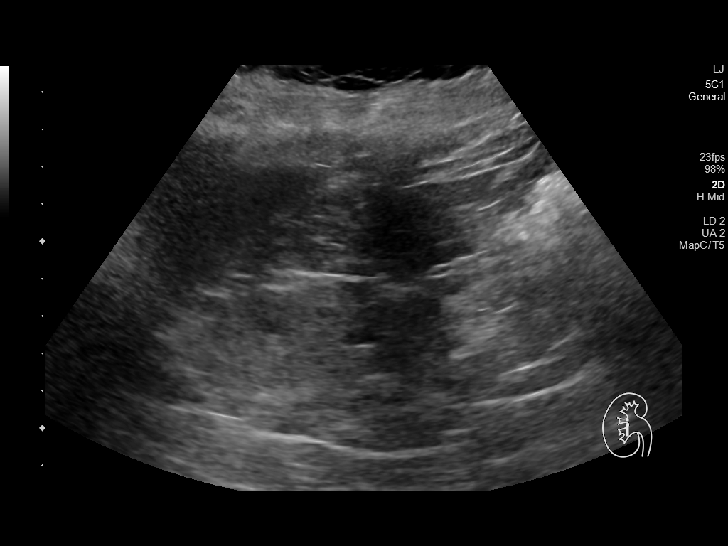
[im 9/51]
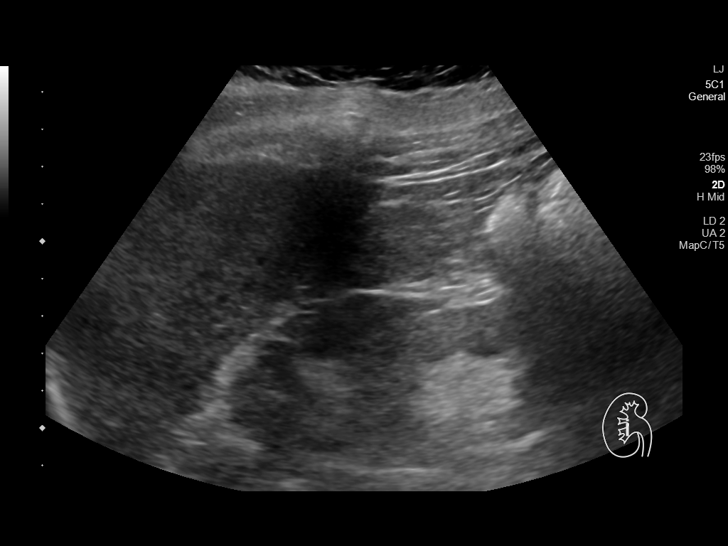
[im 13/51]
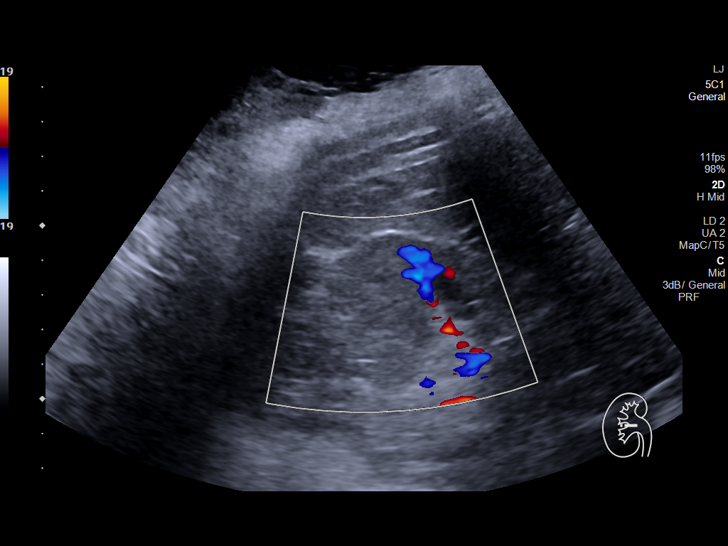
[im 17/51]
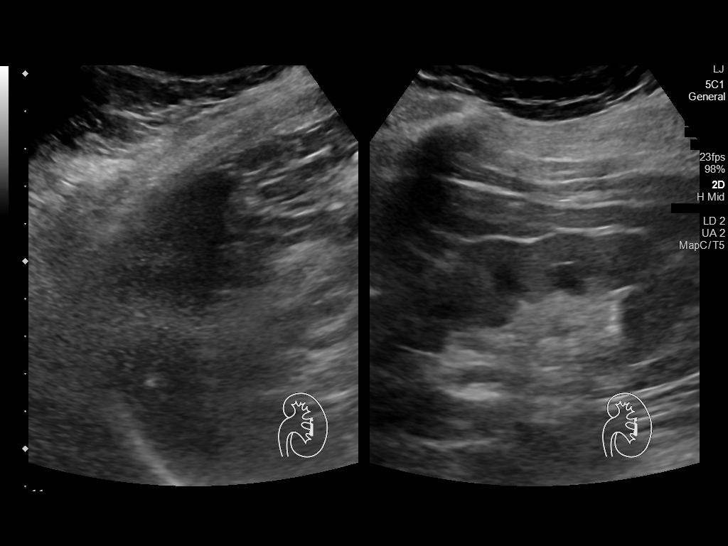
[im 19/51]
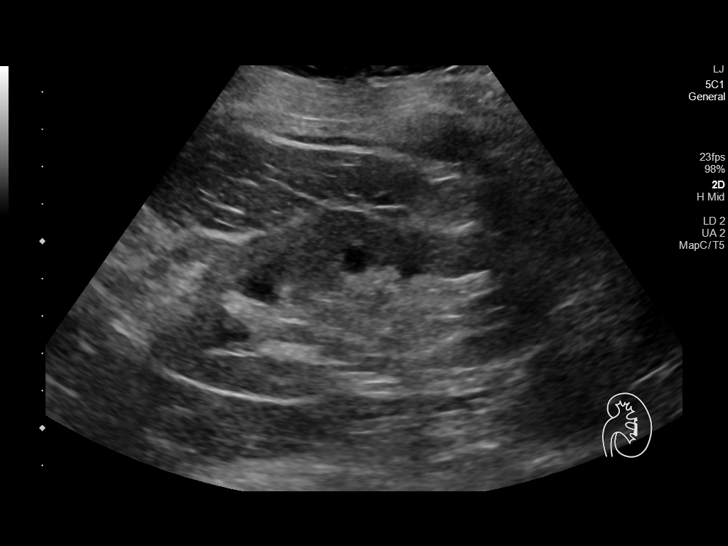
[im 23/51]
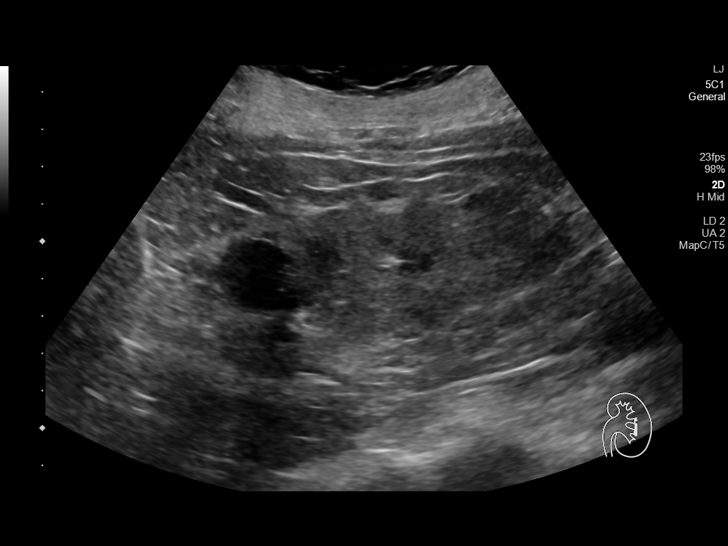
[im 28/51]
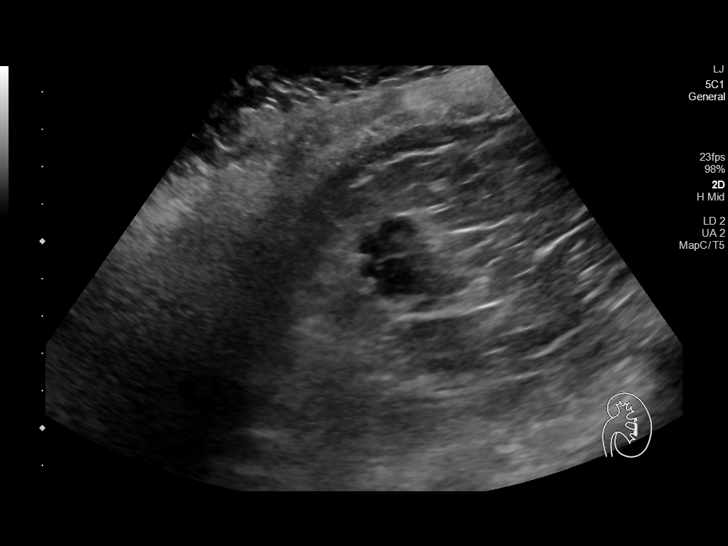
[im 32/51]
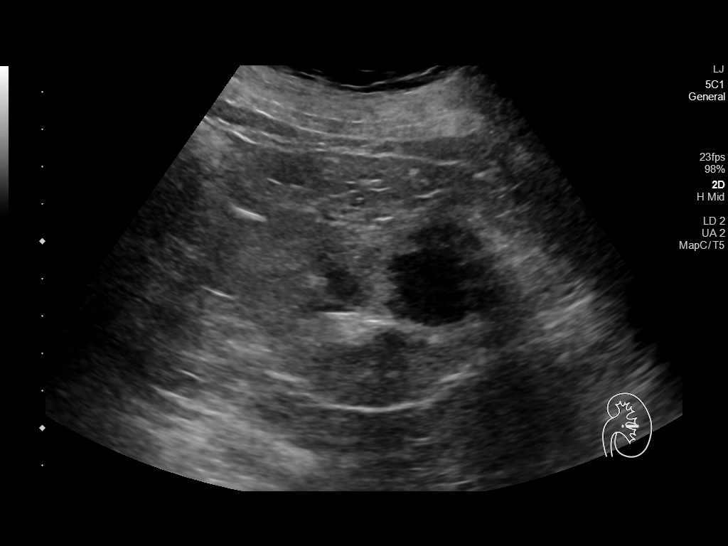
[im 34/51]
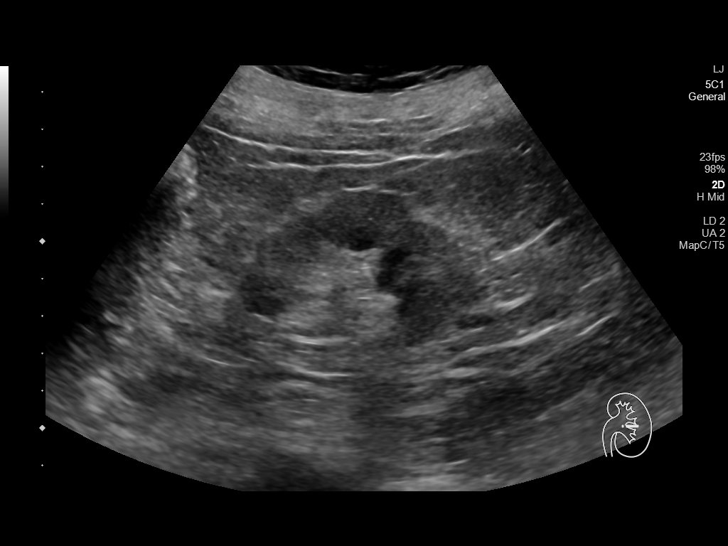
[im 38/51]
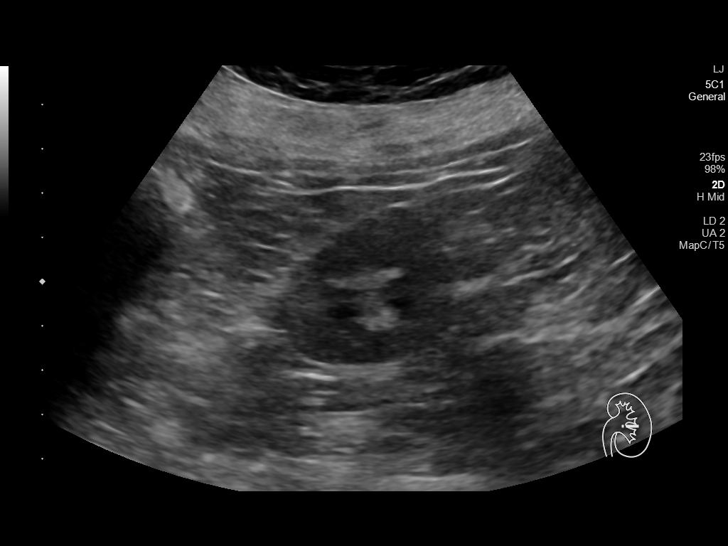
[im 42/51]
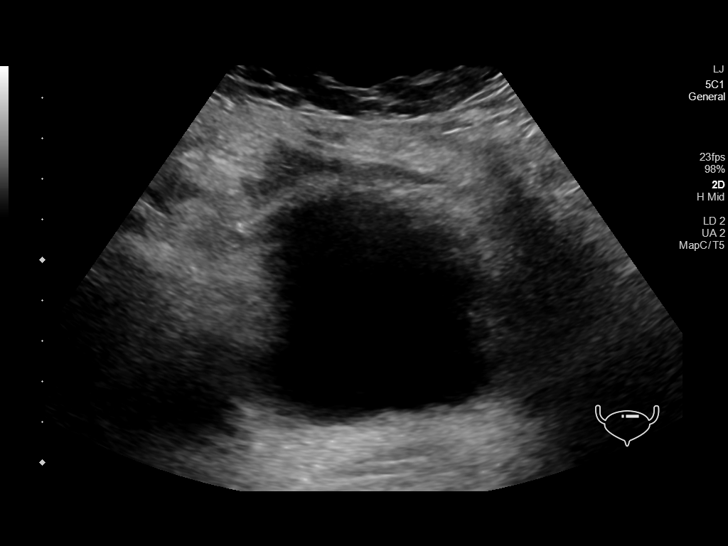
[im 46/51]
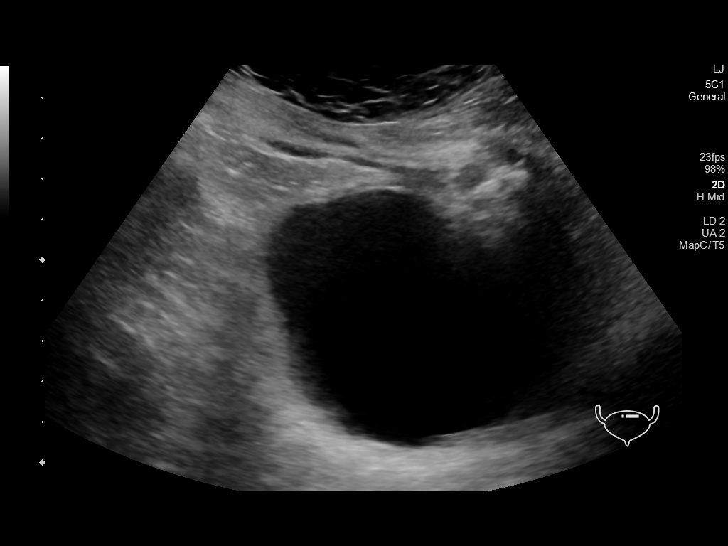
[im 51/51]
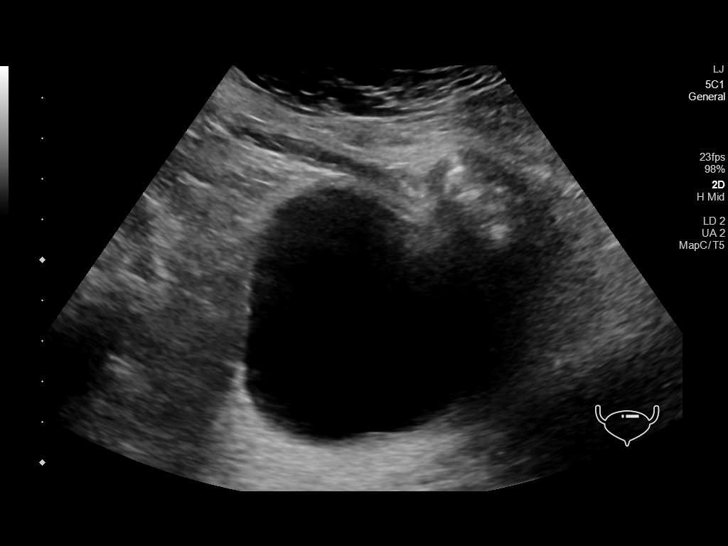

[14 of 25 positions shown; findings below may reference images not displayed]

FINDINGS: Right Kidney:

Renal measurements: 10.1 x 4.1 x 4.8 cm = volume: 104 mL.
Echogenicity within normal limits. No mass or hydronephrosis
visualized.

Left Kidney:

Renal measurements: 10.0 x 4.9 x 4.6 cm = volume: 118 mL.
Echogenicity within normal limits. No hydronephrosis. There is a
x 3.1 x 3.6 cm complex cystic mass in the posterior left kidney
corresponding to CT abnormality. There are internal septations and

Bladder:

Appears normal for degree of bladder distention.

Other:

None.
IMPRESSION: 1. 3.6 cm complex cystic mass in the left kidney, indeterminate.
Recommend further characterization with MRI.

## 2023-02-19 ENCOUNTER — Other Ambulatory Visit (HOSPITAL_COMMUNITY): Payer: Self-pay

## 2023-02-24 NOTE — Telephone Encounter (Signed)
-----   Message from Charise Killian, MD sent at 02/13/2023  9:54 AM EDT ----- Regarding: Urgent: labs only visit Hello. Could you please assist Ms. Collymore with a labs only visit this week? Thank you!  Dr. Saverio Danker

## 2023-02-24 NOTE — Telephone Encounter (Signed)
Just spoke with patient's daughter states her mother has passed away.  No appointment scheduled.  Forwarding message back to Dr. Saverio Danker.

## 2023-02-24 DEATH — deceased

## 2023-04-20 ENCOUNTER — Encounter: Payer: Medicare PPO | Admitting: Dietician

## 2023-06-25 ENCOUNTER — Other Ambulatory Visit (HOSPITAL_COMMUNITY): Payer: Self-pay
